# Patient Record
Sex: Female | Born: 1975
Health system: Southern US, Community
[De-identification: ages and names within clinical notes are randomized; demographics above are authoritative.]

## PROBLEM LIST (undated history)

## (undated) DIAGNOSIS — F319 Bipolar disorder, unspecified: Secondary | ICD-10-CM

## (undated) DIAGNOSIS — N649 Disorder of breast, unspecified: Secondary | ICD-10-CM

## (undated) DIAGNOSIS — Z8614 Personal history of Methicillin resistant Staphylococcus aureus infection: Secondary | ICD-10-CM

## (undated) DIAGNOSIS — J449 Chronic obstructive pulmonary disease, unspecified: Secondary | ICD-10-CM

## (undated) DIAGNOSIS — F209 Schizophrenia, unspecified: Secondary | ICD-10-CM

## (undated) DIAGNOSIS — R519 Headache, unspecified: Secondary | ICD-10-CM

## (undated) DIAGNOSIS — Z800 Family history of malignant neoplasm of digestive organs: Secondary | ICD-10-CM

## (undated) DIAGNOSIS — I639 Cerebral infarction, unspecified: Secondary | ICD-10-CM

## (undated) DIAGNOSIS — F909 Attention-deficit hyperactivity disorder, unspecified type: Secondary | ICD-10-CM

## (undated) DIAGNOSIS — Z8042 Family history of malignant neoplasm of prostate: Secondary | ICD-10-CM

## (undated) DIAGNOSIS — F329 Major depressive disorder, single episode, unspecified: Secondary | ICD-10-CM

## (undated) DIAGNOSIS — F32A Depression, unspecified: Secondary | ICD-10-CM

## (undated) DIAGNOSIS — Z803 Family history of malignant neoplasm of breast: Secondary | ICD-10-CM

## (undated) DIAGNOSIS — T50901A Poisoning by unspecified drugs, medicaments and biological substances, accidental (unintentional), initial encounter: Secondary | ICD-10-CM

## (undated) DIAGNOSIS — C50919 Malignant neoplasm of unspecified site of unspecified female breast: Secondary | ICD-10-CM

## (undated) DIAGNOSIS — G43909 Migraine, unspecified, not intractable, without status migrainosus: Secondary | ICD-10-CM

## (undated) DIAGNOSIS — R51 Headache: Secondary | ICD-10-CM

## (undated) DIAGNOSIS — K219 Gastro-esophageal reflux disease without esophagitis: Secondary | ICD-10-CM

## (undated) HISTORY — DX: Malignant neoplasm of unspecified site of unspecified female breast: C50.919

## (undated) HISTORY — DX: Disorder of breast, unspecified: N64.9

## (undated) HISTORY — DX: Family history of malignant neoplasm of digestive organs: Z80.0

## (undated) HISTORY — DX: Family history of malignant neoplasm of breast: Z80.3

## (undated) HISTORY — DX: Family history of malignant neoplasm of prostate: Z80.42

## (undated) HISTORY — DX: Chronic obstructive pulmonary disease, unspecified: J44.9

---
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
## 2004-07-18 ENCOUNTER — Emergency Department: Payer: Self-pay | Admitting: Emergency Medicine

## 2005-03-20 ENCOUNTER — Emergency Department: Payer: Self-pay | Admitting: Emergency Medicine

## 2005-04-15 ENCOUNTER — Emergency Department: Payer: Self-pay | Admitting: Emergency Medicine

## 2005-06-17 ENCOUNTER — Other Ambulatory Visit: Payer: Self-pay

## 2005-06-17 ENCOUNTER — Emergency Department: Payer: Self-pay | Admitting: Unknown Physician Specialty

## 2005-09-09 ENCOUNTER — Emergency Department: Payer: Self-pay | Admitting: Emergency Medicine

## 2006-10-04 ENCOUNTER — Emergency Department: Payer: Self-pay | Admitting: Emergency Medicine

## 2007-01-15 ENCOUNTER — Emergency Department: Payer: Self-pay | Admitting: Emergency Medicine

## 2007-06-21 ENCOUNTER — Emergency Department: Payer: Self-pay | Admitting: Emergency Medicine

## 2008-03-25 DIAGNOSIS — T50901A Poisoning by unspecified drugs, medicaments and biological substances, accidental (unintentional), initial encounter: Secondary | ICD-10-CM

## 2008-03-25 HISTORY — DX: Poisoning by unspecified drugs, medicaments and biological substances, accidental (unintentional), initial encounter: T50.901A

## 2008-04-07 ENCOUNTER — Emergency Department: Payer: Self-pay | Admitting: Emergency Medicine

## 2010-01-18 ENCOUNTER — Emergency Department: Payer: Self-pay | Admitting: Unknown Physician Specialty

## 2010-08-12 ENCOUNTER — Emergency Department: Payer: Self-pay | Admitting: Emergency Medicine

## 2010-10-31 ENCOUNTER — Emergency Department: Payer: Self-pay | Admitting: Emergency Medicine

## 2010-11-28 ENCOUNTER — Emergency Department: Payer: Self-pay | Admitting: Internal Medicine

## 2011-03-08 ENCOUNTER — Emergency Department: Payer: Self-pay | Admitting: Unknown Physician Specialty

## 2011-11-24 ENCOUNTER — Emergency Department: Payer: Self-pay | Admitting: Emergency Medicine

## 2012-11-09 ENCOUNTER — Emergency Department: Payer: Self-pay | Admitting: Emergency Medicine

## 2012-11-09 LAB — URINALYSIS, COMPLETE
Blood: NEGATIVE
Glucose,UR: NEGATIVE mg/dL (ref 0–75)
Leukocyte Esterase: NEGATIVE
Nitrite: NEGATIVE
Ph: 6 (ref 4.5–8.0)
Protein: NEGATIVE
RBC,UR: 1 /HPF (ref 0–5)
Squamous Epithelial: 2
WBC UR: <1 /HPF (ref 0–5)

## 2014-07-04 ENCOUNTER — Emergency Department
Admit: 2014-07-04 | Disposition: A | Discharge status: Home / Self Care | Payer: Self-pay | Admitting: Emergency Medicine

## 2014-07-06 ENCOUNTER — Emergency Department: Admit: 2014-07-06 | Disposition: A | Discharge status: Home / Self Care | Payer: Self-pay | Admitting: Internal Medicine

## 2014-10-05 ENCOUNTER — Emergency Department
Admission: EM | Admit: 2014-10-05 | Discharge: 2014-10-05 | Disposition: A | Discharge status: Home / Self Care | Payer: Medicaid Other | Attending: Emergency Medicine | Admitting: Emergency Medicine

## 2014-10-05 ENCOUNTER — Emergency Department: Payer: Medicaid Other

## 2014-10-05 ENCOUNTER — Encounter: Payer: Self-pay | Admitting: *Deleted

## 2014-10-05 DIAGNOSIS — S92512A Displaced fracture of proximal phalanx of left lesser toe(s), initial encounter for closed fracture: Secondary | ICD-10-CM | POA: Insufficient documentation

## 2014-10-05 DIAGNOSIS — F331 Major depressive disorder, recurrent, moderate: Secondary | ICD-10-CM | POA: Insufficient documentation

## 2014-10-05 DIAGNOSIS — Y998 Other external cause status: Secondary | ICD-10-CM | POA: Diagnosis not present

## 2014-10-05 DIAGNOSIS — Y9389 Activity, other specified: Secondary | ICD-10-CM | POA: Insufficient documentation

## 2014-10-05 DIAGNOSIS — W228XXA Striking against or struck by other objects, initial encounter: Secondary | ICD-10-CM | POA: Diagnosis not present

## 2014-10-05 DIAGNOSIS — S92912A Unspecified fracture of left toe(s), initial encounter for closed fracture: Secondary | ICD-10-CM

## 2014-10-05 DIAGNOSIS — Z720 Tobacco use: Secondary | ICD-10-CM | POA: Diagnosis not present

## 2014-10-05 DIAGNOSIS — Y9289 Other specified places as the place of occurrence of the external cause: Secondary | ICD-10-CM | POA: Insufficient documentation

## 2014-10-05 DIAGNOSIS — S99922A Unspecified injury of left foot, initial encounter: Secondary | ICD-10-CM | POA: Diagnosis present

## 2014-10-05 DIAGNOSIS — F329 Major depressive disorder, single episode, unspecified: Secondary | ICD-10-CM | POA: Insufficient documentation

## 2014-10-05 DIAGNOSIS — F319 Bipolar disorder, unspecified: Secondary | ICD-10-CM | POA: Insufficient documentation

## 2014-10-05 HISTORY — DX: Bipolar disorder, unspecified: F31.9

## 2014-10-05 HISTORY — DX: Depression, unspecified: F32.A

## 2014-10-05 HISTORY — DX: Major depressive disorder, single episode, unspecified: F32.9

## 2014-10-05 MED ORDER — HYDROCODONE-ACETAMINOPHEN 5-325 MG PO TABS: 1.0000 | ORAL_TABLET | ORAL | Status: DC | PRN

## 2014-10-05 MED ORDER — NAPROXEN 500 MG PO TABS: 500.0000 mg | ORAL_TABLET | Freq: Two times a day (BID) | ORAL | Status: DC

## 2014-10-05 NOTE — ED Notes (Signed)
Hit left foot on chair last pm, c/o pain left 3/4 toe

## 2014-10-05 NOTE — ED Provider Notes (Signed)
[facility] [place] Emergency Department Provider Note  ____________________________________________  Time seen:  11:38 AM  I have reviewed the triage vital signs and the nursing notes.   HISTORY  Chief Complaint Toe Pain   HPI Carol Crawford is a 39 y.o. female is here with complaint of toe pain. She states that last evening she hit a metal chair and has had pain in her fourth toe since. She denies any medication this morning. He rates her pain as 8/10. Walking makes her pain worse. Sitting or lying with her foot up and improves her pain.She denies any previous injury to her foot.   Past Medical History  Diagnosis Date  . Depressed   . Bipolar 1 disorder     Patient Active Problem List   Diagnosis Date Noted  . Depressed   . Bipolar 1 disorder     History reviewed. No pertinent past surgical history.  Current Outpatient Rx  Name  Route  Sig  Dispense  Refill  . HYDROcodone-acetaminophen (NORCO/VICODIN) 5-325 MG per tablet   Oral   Take 1 tablet by mouth every 4 (four) hours as needed for moderate pain.   20 tablet   0   . naproxen (NAPROSYN) 500 MG tablet   Oral   Take 1 tablet (500 mg total) by mouth 2 (two) times daily with a meal.   30 tablet   0     Allergies Review of patient's allergies indicates no known allergies.  No family history on file.  Social History History  Substance Use Topics  . Smoking status: Current Every Day Smoker  . Smokeless tobacco: Not on file  . Alcohol Use: Yes    Review of Systems Constitutional: No fever/chills Eyes: No visual changes. Cardiovascular: Denies chest pain. Respiratory: Denies shortness of breath. Gastrointestinal: No abdominal pain.  No nausea, no vomiting. Genitourinary: Negative for dysuria. Musculoskeletal: Negative for back pain. Skin: Negative for rash. Neurological: Negative for headaches, focal weakness or numbness.  10-point ROS otherwise  negative.  ____________________________________________   PHYSICAL EXAM:  VITAL SIGNS: ED Triage Vitals  Enc Vitals Group     BP 10/05/14 1029 107/76 mmHg     Pulse Rate 10/05/14 1029 78     Resp 10/05/14 1029 18     Temp 10/05/14 1029 98.4 F (36.9 C)     Temp Source 10/05/14 1029 Oral     SpO2 10/05/14 1029 99 %     Weight 10/05/14 1029 157 lb (71.215 kg)     Height 10/05/14 1029 5\' 7"  (1.702 m)     Head Cir --      Peak Flow --      Pain Score 10/05/14 1028 8     Pain Loc --      Pain Edu? --      Excl. in [place]? --     Constitutional: Alert and oriented. Well appearing and in no acute distress. Eyes: Conjunctivae are normal. PERRL. EOMI. Head: Atraumatic. Nose: No congestion/rhinnorhea.  Neck: No stridor.   Cardiovascular: Normal rate, regular rhythm. Grossly normal heart sounds.  Good peripheral circulation. Respiratory: Normal respiratory effort.  No retractions. Lungs CTAB. Gastrointestinal: Soft and nontender. No distention Musculoskeletal: No lower extremity tenderness nor edema.  No joint effusions. Left fourth toe moderate edema noted. No gross deformity. Moderate tenderness on palpation. Range of motion deferred secondary to pain. Motor sensory function intact Neurologic:  Normal speech and language. No gross focal neurologic deficits are appreciated. No gait instability. Skin:  Skin is warm, dry and intact. No rash noted. No ecchymosis or abrasions noted. Psychiatric: Mood and affect are normal. Speech and behavior are normal.  ____________________________________________   LABS (all labs ordered are listed, but only abnormal results are displayed)  Labs Reviewed - No data to display   RADIOLOGY  Left foot x-ray per radiologist and reviewed by me shows minimal displaced oblique fracture proximal phalanx fourth toe. I, Johnn Hai, personally viewed and evaluated these images as part of my medical decision making.   ____________________________________________   PROCEDURES  Procedure(s) performed: None  Critical Care performed: No  ____________________________________________   INITIAL IMPRESSION / ASSESSMENT AND PLAN / ED COURSE  Pertinent labs & imaging results that were available during my care of the patient were reviewed by me and considered in my medical decision making (see chart for details).  Patient was buddy taped and put in a wooden shoe. She was started on Norco for pain and Naprosyn for inflammation and pain. She is follow-up with Dr. Cleda Mccreedy or her PCP as needed. Ice and elevation. ____________________________________________   FINAL CLINICAL IMPRESSION(S) / ED DIAGNOSES  Final diagnoses:  Fracture, toe, left, closed, initial encounter      Johnn Hai, PA-C 10/05/14 1210  Delman Kitten, MD 10/05/14 1558

## 2014-10-05 NOTE — ED Notes (Signed)
3rd and 4th toes on left foot were taped together. Patient tolerated procedure well.

## 2014-10-05 NOTE — Discharge Instructions (Signed)
ICE AND ELEVATE NORCO FOR SEVERE PAIN, NAPROXEN FOR INFLAMMATION AND PAIN EVERY DAY FOLLOW UP WITH DR. CLINE IF ANY PROBLEMS

## 2014-10-05 NOTE — ED Notes (Signed)
Hit left foot on chair last pm, c/o pain left 3/4 th toe

## 2014-12-17 ENCOUNTER — Emergency Department
Admission: EM | Admit: 2014-12-17 | Discharge: 2014-12-17 | Disposition: A | Discharge status: Home / Self Care | Payer: Medicaid Other | Attending: Student | Admitting: Student

## 2014-12-17 DIAGNOSIS — L02416 Cutaneous abscess of left lower limb: Secondary | ICD-10-CM | POA: Diagnosis not present

## 2014-12-17 DIAGNOSIS — L03116 Cellulitis of left lower limb: Secondary | ICD-10-CM | POA: Diagnosis not present

## 2014-12-17 DIAGNOSIS — Z720 Tobacco use: Secondary | ICD-10-CM | POA: Diagnosis not present

## 2014-12-17 DIAGNOSIS — N898 Other specified noninflammatory disorders of vagina: Secondary | ICD-10-CM

## 2014-12-17 DIAGNOSIS — L0390 Cellulitis, unspecified: Secondary | ICD-10-CM

## 2014-12-17 DIAGNOSIS — Z791 Long term (current) use of non-steroidal anti-inflammatories (NSAID): Secondary | ICD-10-CM | POA: Insufficient documentation

## 2014-12-17 DIAGNOSIS — L0291 Cutaneous abscess, unspecified: Secondary | ICD-10-CM

## 2014-12-17 MED ORDER — SULFAMETHOXAZOLE-TRIMETHOPRIM 800-160 MG PO TABS: 1.0000 | ORAL_TABLET | Freq: Two times a day (BID) | ORAL | Status: DC

## 2014-12-17 NOTE — Discharge Instructions (Signed)
Abscess An abscess (boil or furuncle) is an infected area on or under the skin. This area is filled with yellowish-white fluid (pus) and other material (debris). HOME CARE   Only take medicines as told by your doctor.  If you were given antibiotic medicine, take it as directed. Finish the medicine even if you start to feel better.  If gauze is used, follow your doctor's directions for changing the gauze.  To avoid spreading the infection:  Keep your abscess covered with a bandage.  Wash your hands well.  Do not share personal care items, towels, or whirlpools with others.  Avoid skin contact with others.  Keep your skin and clothes clean around the abscess.  Keep all doctor visits as told. GET HELP RIGHT AWAY IF:   You have more pain, puffiness (swelling), or redness in the wound site.  You have more fluid or blood coming from the wound site.  You have muscle aches, chills, or you feel sick.  You have a fever. MAKE SURE YOU:   Understand these instructions.  Will watch your condition.  Will get help right away if you are not doing well or get worse. Document Released: 08/28/2007 Document Revised: 09/10/2011 Document Reviewed: 05/24/2011 [facility] Patient Information 2015 [place], [place]. This information is not intended to replace advice given to you by your health care provider. Make sure you discuss any questions you have with your health care provider.                                                                                        Vaginal discharge:  Please follow up with the health department as scheduled on Tuesday. Return to the emergency department for abdominal pain, fever, or other symptoms of concern if you are unable to see the health department sooner. You should avoid intercourse until after your appointment.

## 2014-12-17 NOTE — ED Notes (Signed)
Pt reports that she was bitten by a spider (there was one in the vicinity when she felt the pain). She reports some pain in leg, mostly when she is walking. She also reports that she has vaginal discharge (thick and brown, sometimes dark red) for last 2 weeks but only during sex. She has no discharge in her urine or panties at any other time. She made appt with health department for upcoming week but she doesn't want to wait until then to be seen. NAD noted.

## 2014-12-17 NOTE — ED Provider Notes (Signed)
[facility] Emergency Department Provider Note ____________________________________________  Time seen: Approximately 11:37 AM  I have reviewed the triage vital signs and the nursing notes.   HISTORY  Chief Complaint Insect Bite   HPI Carol Crawford is a 39 y.o. female who presents to the emergency department for evaluation of an insect bite present on her left lower leg. She noticed yesterday evening and it is worse today. She is also having brown vaginal discharge only after intercourse. She denies pelvic pain, dyspareunia, dysuria, or odor. She started Depo injections in August. She has an appointment scheduled with the health department for an exam on Tuesday.   Past Medical History  Diagnosis Date  . Depressed   . Bipolar 1 disorder     Patient Active Problem List   Diagnosis Date Noted  . Depressed   . Bipolar 1 disorder     No past surgical history on file.  Current Outpatient Rx  Name  Route  Sig  Dispense  Refill  . HYDROcodone-acetaminophen (NORCO/VICODIN) 5-325 MG per tablet   Oral   Take 1 tablet by mouth every 4 (four) hours as needed for moderate pain.   20 tablet   0   . naproxen (NAPROSYN) 500 MG tablet   Oral   Take 1 tablet (500 mg total) by mouth 2 (two) times daily with a meal.   30 tablet   0   . sulfamethoxazole-trimethoprim (BACTRIM DS,SEPTRA DS) 800-160 MG per tablet   Oral   Take 1 tablet by mouth 2 (two) times daily.   20 tablet   0     Allergies Review of patient's allergies indicates no known allergies.  No family history on file.  Social History Social History  Substance Use Topics  . Smoking status: Current Every Day Smoker  . Smokeless tobacco: Not on file  . Alcohol Use: Yes    Review of Systems Constitutional: No fever/chills Eyes: No visual changes. ENT: No sore throat. Cardiovascular: Denies chest pain. Respiratory: Denies shortness of breath. Gastrointestinal: No abdominal pain.  No  nausea, no vomiting.  No diarrhea.  No constipation. Genitourinary: Negative for dysuria. Musculoskeletal: Negative for back pain. Skin: Negative for rash. Positive for lesion to LLE Neurological: Negative for headaches, focal weakness or numbness.  10-point ROS otherwise negative.  ____________________________________________   PHYSICAL EXAM:  VITAL SIGNS: ED Triage Vitals  Enc Vitals Group     BP 12/17/14 1115 125/83 mmHg     Pulse Rate 12/17/14 1115 62     Resp 12/17/14 1115 16     Temp 12/17/14 1115 98.7 F (37.1 C)     Temp Source 12/17/14 1115 Oral     SpO2 12/17/14 1115 96 %     Weight 12/17/14 1115 154 lb (69.854 kg)     Height 12/17/14 1115 5\' 7"  (1.702 m)     Head Cir --      Peak Flow --      Pain Score 12/17/14 1116 6     Pain Loc --      Pain Edu? --      Excl. in [place]? --     Constitutional: Alert and oriented. Well appearing and in no acute distress. Eyes: Conjunctivae are normal. PERRL. EOMI. Head: Atraumatic. Nose: No congestion/rhinnorhea. Mouth/Throat: Mucous membranes are moist.  Oropharynx non-erythematous. Neck: No stridor.   Cardiovascular: Normal rate, regular rhythm. Grossly normal heart sounds.  Good peripheral circulation. Respiratory: Normal respiratory effort.  No retractions. Lungs CTAB. Gastrointestinal:  Soft and nontender. No distention. No abdominal bruits. No CVA tenderness. Musculoskeletal: No lower extremity tenderness nor edema.  No joint effusions. Neurologic:  Normal speech and language. No gross focal neurologic deficits are appreciated. No gait instability. Skin:  Skin is warm, dry and intact. No rash noted. 1cm fluctuant lesion with mild surrounding erythema noted to the left medial lower extremity. Psychiatric: Mood and affect are normal. Speech and behavior are normal.  ____________________________________________   LABS (all labs ordered are listed, but only abnormal results are displayed)  Labs Reviewed - No data to  display ____________________________________________  EKG  ____________________________________________  RADIOLOGY  Not indicated. ____________________________________________   PROCEDURES  Procedure(s) performed: None  Critical Care performed: No  ____________________________________________   INITIAL IMPRESSION / ASSESSMENT AND PLAN / ED COURSE  Pertinent labs & imaging results that were available during my care of the patient were reviewed by me and considered in my medical decision making (see chart for details).  I&D not indicated. She will be placed on Bactrim. She will go to the health Department on Tuesday as scheduled for her vaginal exam and STD testing. She was advised to return to the emergency department for abdominal pain, fever, nausea, or vomiting if she is unable to get into the health Department earlier. ____________________________________________   FINAL CLINICAL IMPRESSION(S) / ED DIAGNOSES  Final diagnoses:  Vaginal discharge  Cellulitis and abscess      Victorino Dike, FNP 12/17/14 Shiloh Gayle, MD 12/17/14 1530

## 2014-12-17 NOTE — ED Notes (Signed)
Pt discharged home after verbalizing understanding of discharge instructions; nad noted. 

## 2014-12-17 NOTE — ED Notes (Signed)
Patient presents to the ED with a small round swollen area to her left leg.  Patient states she saw a black spider near her last night and believes the spider bit her.  Patient is in no obvious distress at this time.  Patient states, "I also want to be seen for for something else too, ever since I've been on depo I've had a brownish discharge during sex."  Patient denies any other vaginal bleeding, pain, or discharge, states discharge only occurs during intercourse.

## 2015-04-12 ENCOUNTER — Emergency Department
Admission: EM | Admit: 2015-04-12 | Discharge: 2015-04-12 | Disposition: A | Discharge status: Home / Self Care | Payer: Medicaid Other | Attending: Emergency Medicine | Admitting: Emergency Medicine

## 2015-04-12 ENCOUNTER — Encounter: Payer: Self-pay | Admitting: Emergency Medicine

## 2015-04-12 DIAGNOSIS — R112 Nausea with vomiting, unspecified: Secondary | ICD-10-CM | POA: Diagnosis not present

## 2015-04-12 DIAGNOSIS — N390 Urinary tract infection, site not specified: Secondary | ICD-10-CM | POA: Diagnosis not present

## 2015-04-12 DIAGNOSIS — Z79899 Other long term (current) drug therapy: Secondary | ICD-10-CM | POA: Diagnosis not present

## 2015-04-12 DIAGNOSIS — F17200 Nicotine dependence, unspecified, uncomplicated: Secondary | ICD-10-CM | POA: Insufficient documentation

## 2015-04-12 DIAGNOSIS — Z791 Long term (current) use of non-steroidal anti-inflammatories (NSAID): Secondary | ICD-10-CM | POA: Diagnosis not present

## 2015-04-12 DIAGNOSIS — Z3202 Encounter for pregnancy test, result negative: Secondary | ICD-10-CM | POA: Diagnosis not present

## 2015-04-12 DIAGNOSIS — R319 Hematuria, unspecified: Secondary | ICD-10-CM | POA: Diagnosis present

## 2015-04-12 LAB — CBC WITH DIFFERENTIAL/PLATELET
Basophils Absolute: 0 10*3/uL (ref 0–0.1)
Basophils Relative: 0 %
Eosinophils Absolute: 0.1 10*3/uL (ref 0–0.7)
Eosinophils Relative: 1 %
HCT: 40.3 % (ref 35.0–47.0)
Hemoglobin: 13.4 g/dL (ref 12.0–16.0)
Lymphocytes Relative: 23 %
Lymphs Abs: 1.7 10*3/uL (ref 1.0–3.6)
MCH: 32.1 pg (ref 26.0–34.0)
MCHC: 33.3 g/dL (ref 32.0–36.0)
MCV: 96.5 fL (ref 80.0–100.0)
Monocytes Absolute: 0.4 10*3/uL (ref 0.2–0.9)
Monocytes Relative: 6 %
Neutro Abs: 5 10*3/uL (ref 1.4–6.5)
Neutrophils Relative %: 70 %
Platelets: 215 10*3/uL (ref 150–440)
RBC: 4.18 MIL/uL (ref 3.80–5.20)
RDW: 12.9 % (ref 11.5–14.5)
WBC: 7.2 10*3/uL (ref 3.6–11.0)

## 2015-04-12 LAB — COMPREHENSIVE METABOLIC PANEL WITH GFR
ALT: 14 U/L (ref 14–54)
AST: 17 U/L (ref 15–41)
Albumin: 4 g/dL (ref 3.5–5.0)
Alkaline Phosphatase: 66 U/L (ref 38–126)
Anion gap: 8 (ref 5–15)
BUN: 12 mg/dL (ref 6–20)
CO2: 24 mmol/L (ref 22–32)
Calcium: 9 mg/dL (ref 8.9–10.3)
Chloride: 108 mmol/L (ref 101–111)
Creatinine, Ser: 0.81 mg/dL (ref 0.44–1.00)
GFR calc Af Amer: >60 mL/min
GFR calc non Af Amer: >60 mL/min
Glucose, Bld: 94 mg/dL (ref 65–99)
Potassium: 3.6 mmol/L (ref 3.5–5.1)
Sodium: 140 mmol/L (ref 135–145)
Total Bilirubin: 0.7 mg/dL (ref 0.3–1.2)
Total Protein: 6.7 g/dL (ref 6.5–8.1)

## 2015-04-12 LAB — LIPASE, BLOOD: Lipase: 19 U/L (ref 11–51)

## 2015-04-12 LAB — URINALYSIS COMPLETE WITH MICROSCOPIC (ARMC ONLY)
BILIRUBIN URINE: NEGATIVE
GLUCOSE, UA: NEGATIVE mg/dL
Ketones, ur: NEGATIVE mg/dL
NITRITE: NEGATIVE
PROTEIN: 30 mg/dL — AB
Specific Gravity, Urine: 1.021 (ref 1.005–1.030)
pH: 6 (ref 5.0–8.0)

## 2015-04-12 LAB — PREGNANCY, URINE: PREG TEST UR: NEGATIVE

## 2015-04-12 MED ORDER — DEXTROSE 5 % IV SOLN
1.0000 g | Freq: Once | INTRAVENOUS | Status: AC
  Administered 2015-04-12: 1 g via INTRAVENOUS
  Filled 2015-04-12: qty 10

## 2015-04-12 MED ORDER — ONDANSETRON HCL 4 MG/2ML IJ SOLN
4.0000 mg | Freq: Once | INTRAMUSCULAR | Status: AC
  Administered 2015-04-12: 4 mg via INTRAVENOUS
  Filled 2015-04-12: qty 2

## 2015-04-12 MED ORDER — SODIUM CHLORIDE 0.9 % IV BOLUS (SEPSIS)
1000.0000 mL | Freq: Once | INTRAVENOUS | Status: AC
  Administered 2015-04-12: 1000 mL via INTRAVENOUS

## 2015-04-12 MED ORDER — PHENAZOPYRIDINE HCL 200 MG PO TABS: 200.0000 mg | ORAL_TABLET | Freq: Three times a day (TID) | ORAL | Status: DC | PRN

## 2015-04-12 MED ORDER — CEPHALEXIN 500 MG PO CAPS: 500.0000 mg | ORAL_CAPSULE | Freq: Three times a day (TID) | ORAL | Status: AC

## 2015-04-12 NOTE — ED Provider Notes (Signed)
CSN: [id number]     Arrival date & time 04/12/15  1016 History   First MD Initiated Contact with Patient 04/12/15 1031     Chief Complaint  Patient presents with  . Hematuria     (Consider location/radiation/quality/duration/timing/severity/associated sxs/prior Treatment) The history is provided by the patient.  Carol Crawford is a 40 y.o. female hx of depression, bipolar, UTI here with some urinary hesitancy, dysuria. Last several days she noticed that she has pain when she urinates and has only been urinating very little. She feel nauseated today while at work and had several episodes of vomiting. Of note, patient had some vaginal spotting yesterday that completely resolved. Her last step or shot was 6 months ago and she did miss her 3 month depo shot and is still sexually active.     Past Medical History  Diagnosis Date  . Depressed   . Bipolar 1 disorder ([place])    History reviewed. No pertinent past surgical history. History reviewed. No pertinent family history. Social History  Substance Use Topics  . Smoking status: Current Every Day Smoker  . Smokeless tobacco: None  . Alcohol Use: Yes   OB History    No data available     Review of Systems  Gastrointestinal: Positive for nausea and vomiting.  Genitourinary: Positive for hematuria.  All other systems reviewed and are negative.     Allergies  Review of patient's allergies indicates no known allergies.  Home Medications   Prior to Admission medications   Medication Sig Start Date End Date Taking? Authorizing Provider  HYDROcodone-acetaminophen (NORCO/VICODIN) 5-325 MG per tablet Take 1 tablet by mouth every 4 (four) hours as needed for moderate pain. Patient not taking: Reported on 04/12/2015 10/05/14   Johnn Hai, PA-C  naproxen (NAPROSYN) 500 MG tablet Take 1 tablet (500 mg total) by mouth 2 (two) times daily with a meal. Patient not taking: Reported on 04/12/2015 10/05/14   Johnn Hai, PA-C   sulfamethoxazole-trimethoprim (BACTRIM DS,SEPTRA DS) 800-160 MG per tablet Take 1 tablet by mouth 2 (two) times daily. Patient not taking: Reported on 04/12/2015 12/17/14   Cari B Triplett, FNP   BP 115/77 mmHg  Pulse 63  Temp(Src) 98.3 F (36.8 C) (Oral)  Resp 18  Ht 5\' 7"  (1.702 m)  Wt 157 lb (71.215 kg)  BMI 24.58 kg/m2  SpO2 100%  LMP 03/15/2015 Physical Exam  Constitutional: She is oriented to person, place, and time.  Nauseated   HENT:  Head: Normocephalic.  MM dry   Eyes: Conjunctivae are normal. Pupils are equal, round, and reactive to light.  Neck: Normal range of motion. Neck supple.  Cardiovascular: Normal rate, regular rhythm and normal heart sounds.   Pulmonary/Chest: Effort normal and breath sounds normal. No respiratory distress. She has no wheezes. She has no rales.  Abdominal: Soft. Bowel sounds are normal.  Mild suprapubic tenderness, no CVAT   Musculoskeletal: Normal range of motion. She exhibits no edema or tenderness.  Neurological: She is alert and oriented to person, place, and time.  Skin: Skin is warm and dry.  Psychiatric: She has a normal mood and affect. Her behavior is normal. Judgment and thought content normal.  Nursing note and vitals reviewed.   ED Course  Procedures (including critical care time) Labs Review Labs Reviewed  URINALYSIS COMPLETEWITH MICROSCOPIC ([facility] ONLY) - Abnormal; Notable for the following:    Color, Urine YELLOW (*)    APPearance CLOUDY (*)    Hgb urine dipstick 1+ (*)  Protein, ur 30 (*)    Leukocytes, UA 2+ (*)    Bacteria, UA RARE (*)    Squamous Epithelial / LPF 6-30 (*)    All other components within normal limits  URINE CULTURE  PREGNANCY, URINE  CBC WITH DIFFERENTIAL/PLATELET  COMPREHENSIVE METABOLIC PANEL  LIPASE, BLOOD    Imaging Review No results found. I have personally reviewed and evaluated these images and lab results as part of my medical decision-making.   EKG Interpretation None       MDM   Final diagnoses:  None   Carol Crawford is a 40 y.o. female here with dysuria, ab pain. Likely UTI vs gastro. Will also check pregnancy test as she missed her depo shot several months ago.   2:32 PM UCG neg. UA + UTI. WBC nl. Vitals stable. Symptoms likely from UTI. Given ceftriaxone. Will dc home with keflex.     Wandra Arthurs, MD 04/12/15 [phone]

## 2015-04-12 NOTE — ED Notes (Signed)
Reports urinary hesitancy for several days and yesterday noted a little blood.  Today feeling nauseated.

## 2015-04-12 NOTE — Discharge Instructions (Signed)
Take keflex three times daily for 5 days.   Take pyridium for dysuria for next 3 days.   See your doctor.   Return to ER if you have worse trouble urinating, severe pain, fevers, unable to urinate.

## 2015-04-14 LAB — URINE CULTURE

## 2015-05-08 ENCOUNTER — Encounter: Payer: Self-pay | Admitting: Emergency Medicine

## 2015-05-08 ENCOUNTER — Emergency Department
Admission: EM | Admit: 2015-05-08 | Discharge: 2015-05-08 | Disposition: A | Discharge status: Home / Self Care | Payer: Medicaid Other | Attending: Emergency Medicine | Admitting: Emergency Medicine

## 2015-05-08 DIAGNOSIS — F17200 Nicotine dependence, unspecified, uncomplicated: Secondary | ICD-10-CM | POA: Diagnosis not present

## 2015-05-08 DIAGNOSIS — L03211 Cellulitis of face: Secondary | ICD-10-CM | POA: Diagnosis not present

## 2015-05-08 DIAGNOSIS — R220 Localized swelling, mass and lump, head: Secondary | ICD-10-CM | POA: Diagnosis present

## 2015-05-08 MED ORDER — SULFAMETHOXAZOLE-TRIMETHOPRIM 800-160 MG PO TABS: 1.0000 | ORAL_TABLET | Freq: Two times a day (BID) | ORAL | Status: DC

## 2015-05-08 MED ORDER — OXYCODONE-ACETAMINOPHEN 5-325 MG PO TABS: 1.0000 | ORAL_TABLET | Freq: Four times a day (QID) | ORAL | Status: DC | PRN

## 2015-05-08 MED ORDER — GENTAMICIN SULFATE 0.3 % OP SOLN: 1.0000 [drp] | Freq: Three times a day (TID) | OPHTHALMIC | Status: AC

## 2015-05-08 MED ORDER — IBUPROFEN 600 MG PO TABS: 600.0000 mg | ORAL_TABLET | Freq: Four times a day (QID) | ORAL | Status: DC | PRN

## 2015-05-08 NOTE — ED Notes (Signed)
Reports having a small bump on nose and popped it, then had swelling to right face.  No resp distress.

## 2015-05-08 NOTE — Discharge Instructions (Signed)
Cellulitis °Cellulitis is an infection of the skin and the tissue under the skin. The infected area is usually red and tender. This happens most often in the arms and lower legs. °HOME CARE  °· Take your antibiotic medicine as told. Finish the medicine even if you start to feel better. °· Keep the infected arm or leg raised (elevated). °· Put a warm cloth on the area up to 4 times per day. °· Only take medicines as told by your doctor. °· Keep all doctor visits as told. °GET HELP IF: °· You see red streaks on the skin coming from the infected area. °· Your red area gets bigger or turns a dark color. °· Your bone or joint under the infected area is painful after the skin heals. °· Your infection comes back in the same area or different area. °· You have a puffy (swollen) bump in the infected area. °· You have new symptoms. °· You have a fever. °GET HELP RIGHT AWAY IF:  °· You feel very sleepy. °· You throw up (vomit) or have watery poop (diarrhea). °· You feel sick and have muscle aches and pains. °  °This information is not intended to replace advice given to you by your health care provider. Make sure you discuss any questions you have with your health care provider. °  °Document Released: 08/28/2007 Document Revised: 11/30/2014 Document Reviewed: 05/27/2011 °[employer]vier Interactive Patient Education ©2016 [employer] ° °

## 2015-05-08 NOTE — ED Provider Notes (Signed)
[facility] Emergency Department Provider Note  ____________________________________________  Time seen: Approximately 9:15 AM  I have reviewed the triage vital signs and the nursing notes.   HISTORY  Chief Complaint Facial Swelling    HPI LEE NWANKWO is a 40 y.o. female patient complain of facial swelling secondary to rupturing a bump on the inside of the left nostril. Incident occurred yesterday. Patient awakened this morning for increased swelling. Mostly on the right side of the maxillary area. Patient rates the pain as a 5/10. No palliative measures taken for this complaint. Patient also states she had right eyelid edema and matting.  Past Medical History  Diagnosis Date  . Depressed   . Bipolar 1 disorder [facility])     Patient Active Problem List   Diagnosis Date Noted  . Depressed   . Bipolar 1 disorder ([place])     History reviewed. No pertinent past surgical history.  Current Outpatient Rx  Name  Route  Sig  Dispense  Refill  . gentamicin (GARAMYCIN) 0.3 % ophthalmic solution   Both Eyes   Place 1 drop into both eyes 3 (three) times daily.   5 mL   0   . HYDROcodone-acetaminophen (NORCO/VICODIN) 5-325 MG per tablet   Oral   Take 1 tablet by mouth every 4 (four) hours as needed for moderate pain. Patient not taking: Reported on 04/12/2015   20 tablet   0   . ibuprofen (ADVIL,MOTRIN) 600 MG tablet   Oral   Take 1 tablet (600 mg total) by mouth every 6 (six) hours as needed.   30 tablet   0   . naproxen (NAPROSYN) 500 MG tablet   Oral   Take 1 tablet (500 mg total) by mouth 2 (two) times daily with a meal. Patient not taking: Reported on 04/12/2015   30 tablet   0   . oxyCODONE-acetaminophen (ROXICET) 5-325 MG tablet   Oral   Take 1 tablet by mouth every 6 (six) hours as needed for moderate pain.   12 tablet   0   . phenazopyridine (PYRIDIUM) 200 MG tablet   Oral   Take 1 tablet (200 mg total) by mouth 3 (three) times daily as  needed for pain.   6 tablet   0   . sulfamethoxazole-trimethoprim (BACTRIM DS,SEPTRA DS) 800-160 MG per tablet   Oral   Take 1 tablet by mouth 2 (two) times daily. Patient not taking: Reported on 04/12/2015   20 tablet   0   . sulfamethoxazole-trimethoprim (BACTRIM DS,SEPTRA DS) 800-160 MG tablet   Oral   Take 1 tablet by mouth 2 (two) times daily.   20 tablet   0     Allergies Review of patient's allergies indicates no known allergies.  History reviewed. No pertinent family history.  Social History Social History  Substance Use Topics  . Smoking status: Current Every Day Smoker  . Smokeless tobacco: None  . Alcohol Use: Yes    Review of Systems Constitutional: No fever/chills Eyes: No visual changes. ENT: No sore throat. Cardiovascular: Denies chest pain. Respiratory: Denies shortness of breath. Gastrointestinal: No abdominal pain.  No nausea, no vomiting.  No diarrhea.  No constipation. Genitourinary: Negative for dysuria. Musculoskeletal: Negative for back pain. Skin: Negative for rash. Right maxillary edema and erythema Neurological: Negative for headaches, focal weakness or numbness.    ____________________________________________   PHYSICAL EXAM:  VITAL SIGNS: ED Triage Vitals  Enc Vitals Group     BP 05/08/15 0908 130/86  mmHg     Pulse Rate 05/08/15 0908 62     Resp 05/08/15 0908 16     Temp 05/08/15 0908 98.5 F (36.9 C)     Temp Source 05/08/15 0908 Oral     SpO2 05/08/15 0908 100 %     Weight 05/08/15 0908 157 lb (71.215 kg)     Height 05/08/15 0908 5\' 7"  (1.702 m)     Head Cir --      Peak Flow --      Pain Score 05/08/15 0907 5     Pain Loc --      Pain Edu? --      Excl. in [place]? --     Constitutional: Alert and oriented. Well appearing and in no acute distress. Eyes: Right conjunctiva erythematous. PERRL. EOMI. traumatic material upper and lower eyelid Head: Atraumatic. Nose: No congestion/rhinnorhea. Mouth/Throat: Mucous membranes  are moist.  Oropharynx non-erythematous. Neck: No stridor.  No cervical spine tenderness to palpation. Hematological/Lymphatic/Immunilogical: No cervical lymphadenopathy. Cardiovascular: Normal rate, regular rhythm. Grossly normal heart sounds.  Good peripheral circulation. Respiratory: Normal respiratory effort.  No retractions. Lungs CTAB. Gastrointestinal: Soft and nontender. No distention. No abdominal bruits. No CVA tenderness. Musculoskeletal: No lower extremity tenderness nor edema.  No joint effusions. Neurologic:  Normal speech and language. No gross focal neurologic deficits are appreciated. No gait instability. Skin:  Skin is warm, dry and intact. Erythematous papular lesion anterior right nostril. Right facial edema with mild erythema. Psychiatric: Mood and affect are normal. Speech and behavior are normal.  ____________________________________________   LABS (all labs ordered are listed, but only abnormal results are displayed)  Labs Reviewed - No data to display ____________________________________________  EKG   ____________________________________________  RADIOLOGY   ____________________________________________   PROCEDURES  Procedure(s) performed: None  Critical Care performed: No  ____________________________________________   INITIAL IMPRESSION / ASSESSMENT AND PLAN / ED COURSE  Pertinent labs & imaging results that were available during my care of the patient were reviewed by me and considered in my medical decision making (see chart for details).  Facial cellulitis. Right conjunctivitis Patient given discharge Instructions. Patient given prescription for Bactrim DS, Percocet, ibuprofen, and Garamycin eyedrops.  follow-up with family doctor if no improvement. ____________________________________________   FINAL CLINICAL IMPRESSION(S) / ED DIAGNOSES  Final diagnoses:  Facial cellulitis      Sable Feil, PA-C 05/08/15  [id number]  Eula Listen, MD 05/08/15 [phone]

## 2015-05-08 NOTE — ED Notes (Signed)
Swelling noted to right side of face for the past 1-2 days  Worse this am   Denies any tooth pain but did have a small "bump" to nose couple days ago

## 2015-11-17 ENCOUNTER — Emergency Department: Payer: No Typology Code available for payment source

## 2015-11-17 ENCOUNTER — Encounter: Payer: Self-pay | Admitting: Emergency Medicine

## 2015-11-17 ENCOUNTER — Emergency Department
Admission: EM | Admit: 2015-11-17 | Discharge: 2015-11-17 | Disposition: A | Discharge status: Home / Self Care | Payer: No Typology Code available for payment source | Attending: Emergency Medicine | Admitting: Emergency Medicine

## 2015-11-17 DIAGNOSIS — F17200 Nicotine dependence, unspecified, uncomplicated: Secondary | ICD-10-CM | POA: Insufficient documentation

## 2015-11-17 DIAGNOSIS — Y9389 Activity, other specified: Secondary | ICD-10-CM | POA: Insufficient documentation

## 2015-11-17 DIAGNOSIS — S9032XA Contusion of left foot, initial encounter: Secondary | ICD-10-CM | POA: Diagnosis not present

## 2015-11-17 DIAGNOSIS — S3992XA Unspecified injury of lower back, initial encounter: Secondary | ICD-10-CM | POA: Diagnosis present

## 2015-11-17 DIAGNOSIS — Y92410 Unspecified street and highway as the place of occurrence of the external cause: Secondary | ICD-10-CM | POA: Insufficient documentation

## 2015-11-17 DIAGNOSIS — Y999 Unspecified external cause status: Secondary | ICD-10-CM | POA: Insufficient documentation

## 2015-11-17 DIAGNOSIS — S39012A Strain of muscle, fascia and tendon of lower back, initial encounter: Secondary | ICD-10-CM | POA: Diagnosis not present

## 2015-11-17 MED ORDER — HYDROCODONE-ACETAMINOPHEN 5-325 MG PO TABS
ORAL_TABLET | ORAL | Status: AC
  Administered 2015-11-17: 1 via ORAL
  Filled 2015-11-17: qty 1

## 2015-11-17 MED ORDER — HYDROCODONE-ACETAMINOPHEN 5-325 MG PO TABS
1.0000 | ORAL_TABLET | Freq: Once | ORAL | Status: AC
  Administered 2015-11-17: 1 via ORAL

## 2015-11-17 MED ORDER — METHOCARBAMOL 500 MG PO TABS: 500.0000 mg | ORAL_TABLET | Freq: Four times a day (QID) | ORAL | 0 refills | Status: DC

## 2015-11-17 MED ORDER — MELOXICAM 15 MG PO TABS: 15.0000 mg | ORAL_TABLET | Freq: Every day | ORAL | 0 refills | Status: DC
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
## 2016-04-08 ENCOUNTER — Encounter: Payer: Self-pay | Admitting: Emergency Medicine

## 2016-04-08 ENCOUNTER — Emergency Department: Payer: Medicaid Other

## 2016-04-08 ENCOUNTER — Emergency Department
Admission: EM | Admit: 2016-04-08 | Discharge: 2016-04-08 | Disposition: A | Discharge status: Home / Self Care | Payer: Medicaid Other | Attending: Emergency Medicine | Admitting: Emergency Medicine

## 2016-04-08 DIAGNOSIS — J069 Acute upper respiratory infection, unspecified: Secondary | ICD-10-CM

## 2016-04-08 DIAGNOSIS — F17200 Nicotine dependence, unspecified, uncomplicated: Secondary | ICD-10-CM | POA: Insufficient documentation

## 2016-04-08 DIAGNOSIS — Z79899 Other long term (current) drug therapy: Secondary | ICD-10-CM | POA: Insufficient documentation

## 2016-04-08 DIAGNOSIS — J40 Bronchitis, not specified as acute or chronic: Secondary | ICD-10-CM | POA: Insufficient documentation

## 2016-04-08 DIAGNOSIS — R112 Nausea with vomiting, unspecified: Secondary | ICD-10-CM | POA: Insufficient documentation

## 2016-04-08 LAB — INFLUENZA PANEL BY PCR (TYPE A & B)
Influenza A By PCR: NEGATIVE
Influenza B By PCR: NEGATIVE

## 2016-04-08 MED ORDER — BENZONATATE 100 MG PO CAPS: 100.0000 mg | ORAL_CAPSULE | Freq: Once | ORAL | Status: DC

## 2016-04-08 MED ORDER — BENZONATATE 100 MG PO CAPS: 100.0000 mg | ORAL_CAPSULE | Freq: Four times a day (QID) | ORAL | 0 refills | Status: DC | PRN

## 2016-04-08 MED ORDER — ACETAMINOPHEN 500 MG PO TABS
ORAL_TABLET | ORAL | Status: AC
  Filled 2016-04-08: qty 2

## 2016-04-08 MED ORDER — TRAMADOL HCL 50 MG PO TABS: 50.0000 mg | ORAL_TABLET | Freq: Once | ORAL | Status: DC

## 2016-04-08 MED ORDER — ACETAMINOPHEN 500 MG PO TABS
1000.0000 mg | ORAL_TABLET | Freq: Once | ORAL | Status: AC
  Administered 2016-04-08: 1000 mg via ORAL

## 2016-04-08 MED ORDER — IBUPROFEN 600 MG PO TABS: 600.0000 mg | ORAL_TABLET | Freq: Once | ORAL | Status: DC

## 2016-04-08 MED ORDER — ONDANSETRON 4 MG PO TBDP
4.0000 mg | ORAL_TABLET | Freq: Once | ORAL | Status: DC
Start: 2016-04-08 — End: 2016-04-08

## 2016-04-08 MED ORDER — TRAMADOL HCL 50 MG PO TABS: 50.0000 mg | ORAL_TABLET | Freq: Four times a day (QID) | ORAL | 0 refills | Status: DC | PRN

## 2016-04-08 NOTE — ED Provider Notes (Signed)
[facility] Emergency Department Provider Note   ____________________________________________   First MD Initiated Contact with Patient 04/08/16 [phone]     (approximate)  I have reviewed the triage vital signs and the nursing notes.   HISTORY  Chief Complaint Cough; Fever; and Influenza    HPI Carol Crawford is a 41 y.o. female who comes into the hospital today with cold symptoms since Friday. 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 She reports her chest felt tight and she had pain in her abdomen when she coughs. She denies any fever and she reports that her cough was productive of yellow sputum. The patient denies any posttussive emesis but reports that she vomited 3 times at work. She has not vomited since then. She reports it looked just like what she had eaten and was not black or having any blood in it. The patient denies any belly pain without cough only when she coughs. 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 She's had a headache with some body aches and has felt weak. The patient did get some Tylenol in triage but did not take anything prior to coming in. She reports though that the Tylenol did help. She is here today for evaluation.   Past Medical History:  Diagnosis Date  . Bipolar 1 disorder ([place])   . Depressed     Patient Active Problem List   Diagnosis Date Noted  . Depressed   . Bipolar 1 disorder ([place])     No past surgical history on file.  Prior to Admission medications   Medication Sig Start Date End Date Taking? Authorizing Provider  benzonatate (TESSALON PERLES) 100 MG capsule Take 1 capsule (100 mg total) by mouth every 6 (six) hours as needed for cough. 04/08/16   Loney Hering, MD  meloxicam (MOBIC) 15 MG tablet Take 1 tablet (15 mg total) by mouth daily. 11/17/15   Charline Bills Cuthriell, PA-C  methocarbamol (ROBAXIN) 500 MG tablet Take 1 tablet (500 mg  total) by mouth 4 (four) times daily. 11/17/15   Charline Bills Cuthriell, PA-C  traMADol (ULTRAM) 50 MG tablet Take 1 tablet (50 mg total) by mouth every 6 (six) hours as needed. 04/08/16   Loney Hering, MD    Allergies Patient has no known allergies.  No family history on file.  Social History Social History  Substance Use Topics  . Smoking status: Current Every Day Smoker  . Smokeless tobacco: Never Used  . Alcohol use Yes    Review of Systems Constitutional: No fever/chills Eyes: No visual changes. ENT: No sore throat. Cardiovascular: Chest tightness Respiratory: Cough Gastrointestinal: Nausea and vomiting with some mild abdominal pain with coughing  No diarrhea.  No constipation. Genitourinary: Negative for dysuria. Musculoskeletal: Negative for back pain. Skin: Negative for rash. Neurological: Headache and generalized weakness  10-point ROS otherwise negative.  ____________________________________________   PHYSICAL EXAM:  VITAL SIGNS: ED Triage Vitals  Enc Vitals Group     BP 04/08/16 0135 119/82     Pulse Rate 04/08/16 0135 80     Resp 04/08/16 0135 18     Temp 04/08/16 0135 98.1 F (36.7 C)     Temp Source 04/08/16 0135 Oral     SpO2 04/08/16 0135 100 %     Weight 04/08/16 0136 173 lb (78.5 kg)     Height 04/08/16 0136 5\' 7"  (1.702 m)  Head Circumference --      Peak Flow --      Pain Score 04/08/16 0136 8     Pain Loc --      Pain Edu? --      Excl. in [place]? --     Constitutional: Alert and oriented. Well appearing and in Mild distress. Eyes: Conjunctivae are normal. PERRL. EOMI. Head: Atraumatic. Nose: No congestion/rhinnorhea. Mouth/Throat: Mucous membranes are moist.  Oropharynx non-erythematous. Cardiovascular: Normal rate, regular rhythm. Grossly normal heart sounds.  Good peripheral circulation. Respiratory: Normal respiratory effort.  No retractions. Lungs CTAB. Gastrointestinal: Soft and nontender. No distention. Positive bowel  sounds Musculoskeletal: No lower extremity tenderness nor edema.   Neurologic:  Normal speech and language.  Skin:  Skin is warm, dry and intact.  Psychiatric: Mood and affect are normal.   ____________________________________________   LABS (all labs ordered are listed, but only abnormal results are displayed)  Labs Reviewed  INFLUENZA PANEL BY PCR (TYPE A & B, H1N1)   ____________________________________________  EKG  none ____________________________________________  RADIOLOGY  CXR ____________________________________________   PROCEDURES  Procedure(s) performed: None  Procedures  Critical Care performed: No  ____________________________________________   INITIAL IMPRESSION / ASSESSMENT AND PLAN / ED COURSE  Pertinent labs & imaging results that were available during my care of the patient were reviewed by me and considered in my medical decision making (see chart for details).  This is a 41 year old female who comes into the hospital today with cold symptoms and some vomiting at work tonight. The patient did receive a flu test and she also received a chest x-ray which was negative. I informed the patient of these results and she was upset. I informed her that she probably has bronchitis with the coughing and the chest tightness but the patient did not believe that she could have bronchitis that she's never had it before. I did order some medications to include some benzonatate tramadol and ibuprofen for the patient but the patient refused the medication. She'll be discharged home to follow-up with her primary care physician.  Clinical Course as of Apr 08 633  Mon Apr 08, 2016  0422 No acute pulmonary process. DG Chest 2 View [AW]    Clinical Course User Index [AW] Loney Hering, MD     ____________________________________________   FINAL CLINICAL IMPRESSION(S) / ED DIAGNOSES  Final diagnoses:  Bronchitis  Viral upper respiratory tract infection   Non-intractable vomiting with nausea, unspecified vomiting type      NEW MEDICATIONS STARTED DURING THIS VISIT:  New Prescriptions   BENZONATATE (TESSALON PERLES) 100 MG CAPSULE    Take 1 capsule (100 mg total) by mouth every 6 (six) hours as needed for cough.   TRAMADOL (ULTRAM) 50 MG TABLET    Take 1 tablet (50 mg total) by mouth every 6 (six) hours as needed.     Note:  This document was prepared using Dragon voice recognition software and may include unintentional dictation errors.    Loney Hering, MD 04/08/16 [phone]

## 2016-04-08 NOTE — ED Notes (Signed)
Pt refused medication and vitals.  Pt states "I ain't never had bronchitis before, I'm going to [facility] to get a real diagnosis."  Pt on cell phone, unsure if pt listening to discharge instructions.  Pt unwilling to wait for discharge until phone call complete.

## 2016-04-08 NOTE — Discharge Instructions (Signed)
Please follow up with your primary care physician.

## 2016-04-08 NOTE — ED Triage Notes (Signed)
Pt ambulatory to trigae with no difficulty. Pt reports cough, congestion, body aches, fever /chills and a headache and vomiting. Pt in no acute distress at this time. Pt works at a [employer].

## 2016-06-27 ENCOUNTER — Emergency Department: Payer: Self-pay

## 2016-06-27 ENCOUNTER — Emergency Department
Admission: EM | Admit: 2016-06-27 | Discharge: 2016-06-27 | Disposition: A | Discharge status: Home / Self Care | Payer: Self-pay | Attending: Emergency Medicine | Admitting: Emergency Medicine

## 2016-06-27 DIAGNOSIS — Y939 Activity, unspecified: Secondary | ICD-10-CM | POA: Insufficient documentation

## 2016-06-27 DIAGNOSIS — S39012A Strain of muscle, fascia and tendon of lower back, initial encounter: Secondary | ICD-10-CM

## 2016-06-27 DIAGNOSIS — X58XXXA Exposure to other specified factors, initial encounter: Secondary | ICD-10-CM | POA: Insufficient documentation

## 2016-06-27 DIAGNOSIS — Y990 Civilian activity done for income or pay: Secondary | ICD-10-CM | POA: Insufficient documentation

## 2016-06-27 DIAGNOSIS — F17200 Nicotine dependence, unspecified, uncomplicated: Secondary | ICD-10-CM | POA: Insufficient documentation

## 2016-06-27 DIAGNOSIS — Y929 Unspecified place or not applicable: Secondary | ICD-10-CM | POA: Insufficient documentation

## 2016-06-27 DIAGNOSIS — M47896 Other spondylosis, lumbar region: Secondary | ICD-10-CM

## 2016-06-27 MED ORDER — IBUPROFEN 600 MG PO TABS: 600.0000 mg | ORAL_TABLET | Freq: Three times a day (TID) | ORAL | 0 refills | Status: DC | PRN

## 2016-06-27 MED ORDER — OXYCODONE-ACETAMINOPHEN 7.5-325 MG PO TABS: 1.0000 | ORAL_TABLET | Freq: Four times a day (QID) | ORAL | 0 refills | Status: DC | PRN

## 2016-06-27 MED ORDER — HYDROMORPHONE HCL 1 MG/ML IJ SOLN
1.0000 mg | Freq: Once | INTRAMUSCULAR | Status: AC
  Administered 2016-06-27: 1 mg via INTRAMUSCULAR
  Filled 2016-06-27: qty 1

## 2016-06-27 MED ORDER — KETOROLAC TROMETHAMINE 30 MG/ML IJ SOLN
30.0000 mg | Freq: Once | INTRAMUSCULAR | Status: AC
  Administered 2016-06-27: 30 mg via INTRAVENOUS
  Filled 2016-06-27: qty 1

## 2016-06-27 MED ORDER — ORPHENADRINE CITRATE 30 MG/ML IJ SOLN
60.0000 mg | Freq: Two times a day (BID) | INTRAMUSCULAR | Status: DC
  Administered 2016-06-27: 60 mg via INTRAVENOUS
  Filled 2016-06-27: qty 2

## 2016-06-27 MED ORDER — KETOROLAC TROMETHAMINE 60 MG/2ML IM SOLN
INTRAMUSCULAR | Status: AC
  Filled 2016-06-27: qty 2

## 2016-06-27 MED ORDER — CYCLOBENZAPRINE HCL 10 MG PO TABS: 10.0000 mg | ORAL_TABLET | Freq: Three times a day (TID) | ORAL | 0 refills | Status: DC | PRN

## 2016-06-27 NOTE — ED Notes (Signed)
See triage note  Presents to ED via ems from home  States she woke up with severe lower back pain/spasms  States she was unable to stand d/t weakness in legs  States she does a lot at work not sure if she moved wrong  Hx of back spasms in past  Very tearful in room

## 2016-06-27 NOTE — ED Provider Notes (Signed)
[facility] Emergency Department Provider Note   ____________________________________________   None    (approximate)  I have reviewed the triage vital signs and the nursing notes.   HISTORY  Chief Complaint Back Pain    HPI Carol Crawford is a 41 y.o. female patient arrives via EMS complaining of acute low back pain. Patient state yesterday she was moving furniture at work without any difficulty or pain. Patient stated upon awakening this morning she called to get out of bed to 20 minutes to get her close wound. Patient she was able to drive to work when she got there the pain increase. Patient stated pain is worse with movement. Patient denies any radicular component to this back pain. Patient denies any bladder or bowel dysfunction. Patient is also complaining of left flank pain as cross the midline of her abdomen.Patient rates pain as a 10 over 10. Patient described a pain as "sharp". No palliative measures prior to arrival.   Past Medical History:  Diagnosis Date  . Bipolar 1 disorder ([place])   . Depressed     Patient Active Problem List   Diagnosis Date Noted  . Depressed   . Bipolar 1 disorder ([place])     No past surgical history on file.  Prior to Admission medications   Medication Sig Start Date End Date Taking? Authorizing Provider  benzonatate (TESSALON PERLES) 100 MG capsule Take 1 capsule (100 mg total) by mouth every 6 (six) hours as needed for cough. 04/08/16   Loney Hering, MD  cyclobenzaprine (FLEXERIL) 10 MG tablet Take 1 tablet (10 mg total) by mouth 3 (three) times daily as needed. 06/27/16   Sable Feil, PA-C  ibuprofen (ADVIL,MOTRIN) 600 MG tablet Take 1 tablet (600 mg total) by mouth every 8 (eight) hours as needed. 06/27/16   Sable Feil, PA-C  meloxicam (MOBIC) 15 MG tablet Take 1 tablet (15 mg total) by mouth daily. 11/17/15   Charline Bills Cuthriell, PA-C  methocarbamol (ROBAXIN) 500 MG tablet Take 1 tablet (500 mg total)  by mouth 4 (four) times daily. 11/17/15   Charline Bills Cuthriell, PA-C  oxyCODONE-acetaminophen (PERCOCET) 7.5-325 MG tablet Take 1 tablet by mouth every 6 (six) hours as needed for severe pain. 06/27/16   Sable Feil, PA-C  traMADol (ULTRAM) 50 MG tablet Take 1 tablet (50 mg total) by mouth every 6 (six) hours as needed. 04/08/16   Loney Hering, MD    Allergies Patient has no known allergies.  No family history on file.  Social History Social History  Substance Use Topics  . Smoking status: Current Every Day Smoker  . Smokeless tobacco: Never Used  . Alcohol use Yes    Review of Systems Constitutional: No fever/chills Eyes: No visual changes. ENT: No sore throat. Cardiovascular: Denies chest pain. Respiratory: Denies shortness of breath. Gastrointestinal: No abdominal pain.  No nausea, no vomiting.  No diarrhea.  No constipation. Genitourinary: Negative for dysuria. Musculoskeletal: Positive for back pain. Skin: Negative for rash. Neurological: Negative for headaches, focal weakness or numbness. Psychiatric:Depression and bipolar ____________________________________________   PHYSICAL EXAM:  VITAL SIGNS: ED Triage Vitals  Enc Vitals Group     BP 06/27/16 0921 (!) 132/98     Pulse Rate 06/27/16 0921 67     Resp 06/27/16 0921 (!) 22     Temp 06/27/16 0921 97.8 F (36.6 C)     Temp Source 06/27/16 0921 Oral     SpO2 06/27/16 0921 100 %  Weight 06/27/16 0914 157 lb (71.2 kg)     Height 06/27/16 0914 5\' 7"  (1.702 m)     Head Circumference --      Peak Flow --      Pain Score 06/27/16 0914 10     Pain Loc --      Pain Edu? --      Excl. in [place]? --     Constitutional: Alert and oriented. Well appearing and in no acute distress. Eyes: Conjunctivae are normal. PERRL. EOMI. Head: Atraumatic. Nose: No congestion/rhinnorhea. Mouth/Throat: Mucous membranes are moist.  Oropharynx non-erythematous. Neck: No stridor.  No cervical spine tenderness to  palpation. Hematological/Lymphatic/Immunilogical: No cervical lymphadenopathy. Cardiovascular: Normal rate, regular rhythm. Grossly normal heart sounds.  Good peripheral circulation. Respiratory: Normal respiratory effort.  No retractions. Lungs CTAB. Gastrointestinal: Soft and nontender. No distention. No abdominal bruits. No CVA tenderness. Musculoskeletal: No obvious spinal deformity. Patient decreased range of motion's all fields. Patient is moderate guarding palpation of L3-S1. Patient state leg test.  Neurologic:  Normal speech and language. No gross focal neurologic deficits are appreciated. No gait instability. Skin:  Skin is warm, dry and intact. No rash noted. Psychiatric: Mood and affect are normal. Speech and behavior are normal.  ____________________________________________   LABS (all labs ordered are listed, but only abnormal results are displayed)  Labs Reviewed  URINALYSIS, COMPLETE (UACMP) WITH MICROSCOPIC  POC URINE PREG, ED   ____________________________________________  EKG   ____________________________________________  RADIOLOGY  No acute findings on x-ray lumbar spine. Patient has  degenerative changes. ____________________________________________   PROCEDURES  Procedure(s) performed: None  Procedures  Critical Care performed: No  ____________________________________________   INITIAL IMPRESSION / ASSESSMENT AND PLAN / ED COURSE  Pertinent labs & imaging results that were available during my care of the patient were reviewed by me and considered in my medical decision making (see chart for details).  Acute low back pain.      ____________________________________________   FINAL CLINICAL IMPRESSION(S) / ED DIAGNOSES  Final diagnoses:  Strain of lumbar region, initial encounter  Other osteoarthritis of spine, lumbar region  Discussed x-ray finding with patient. Patient pain has increased Route 70%. Patient given discharge care  instructions. Patient with a work note. Patient advised follow-up family doctor for continued care.    NEW MEDICATIONS STARTED DURING THIS VISIT:  New Prescriptions   CYCLOBENZAPRINE (FLEXERIL) 10 MG TABLET    Take 1 tablet (10 mg total) by mouth 3 (three) times daily as needed.   IBUPROFEN (ADVIL,MOTRIN) 600 MG TABLET    Take 1 tablet (600 mg total) by mouth every 8 (eight) hours as needed.   OXYCODONE-ACETAMINOPHEN (PERCOCET) 7.5-325 MG TABLET    Take 1 tablet by mouth every 6 (six) hours as needed for severe pain.     Note:  This document was prepared using Dragon voice recognition software and may include unintentional dictation errors.    Sable Feil, PA-C 06/27/16 [place], MD 06/27/16 1332

## 2016-06-27 NOTE — ED Notes (Signed)
Pt reports she does wish to file workmans comp.

## 2016-06-27 NOTE — ED Triage Notes (Signed)
Pt in via EMS from work.  Pt reports working at [employer]; states she was moving furniture yesterday at work without any difficulty or pain.  Pt reports waking up this morning with severe lower back pain, states it took her 20 minutes to get her clothes on, but was able to drive to work.  Pt moaning/groaning in pain upon arrival, states pain is worse with movement.  Pt placed in recliner at this time.

## 2016-07-17 LAB — HM HIV SCREENING LAB: HM HIV Screening: NEGATIVE

## 2017-02-03 ENCOUNTER — Emergency Department
Admission: EM | Admit: 2017-02-03 | Discharge: 2017-02-03 | Disposition: A | Discharge status: Home / Self Care | Payer: Self-pay | Attending: Emergency Medicine | Admitting: Emergency Medicine

## 2017-02-03 ENCOUNTER — Encounter: Payer: Self-pay | Admitting: Emergency Medicine

## 2017-02-03 DIAGNOSIS — M545 Low back pain, unspecified: Secondary | ICD-10-CM

## 2017-02-03 DIAGNOSIS — F17210 Nicotine dependence, cigarettes, uncomplicated: Secondary | ICD-10-CM | POA: Insufficient documentation

## 2017-02-03 LAB — URINALYSIS, COMPLETE (UACMP) WITH MICROSCOPIC
Bacteria, UA: NONE SEEN
Bilirubin Urine: NEGATIVE
Glucose, UA: NEGATIVE mg/dL
Hgb urine dipstick: NEGATIVE
Ketones, ur: NEGATIVE mg/dL
Nitrite: NEGATIVE
Protein, ur: NEGATIVE mg/dL
Specific Gravity, Urine: 1.012 (ref 1.005–1.030)
pH: 7 (ref 5.0–8.0)

## 2017-02-03 LAB — POCT PREGNANCY, URINE: PREG TEST UR: NEGATIVE

## 2017-02-03 MED ORDER — KETOROLAC TROMETHAMINE 30 MG/ML IJ SOLN
30.0000 mg | Freq: Once | INTRAMUSCULAR | Status: AC
  Administered 2017-02-03: 30 mg via INTRAMUSCULAR
  Filled 2017-02-03: qty 1

## 2017-02-03 MED ORDER — TRAMADOL HCL 50 MG PO TABS: 50.0000 mg | ORAL_TABLET | Freq: Four times a day (QID) | ORAL | 0 refills | Status: DC | PRN

## 2017-02-03 MED ORDER — METHOCARBAMOL 500 MG PO TABS: 500.0000 mg | ORAL_TABLET | Freq: Four times a day (QID) | ORAL | 0 refills | Status: DC

## 2017-02-03 NOTE — ED Notes (Signed)

## 2017-02-03 NOTE — ED Triage Notes (Signed)
Patient presents to the ED with lower back pain x 1 week.  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  Patient denies history of chronic back issues.  Patient appears slightly uncomfortable in triage, rocking while sitting.  Patient denies dysuria and states pain is worse with movement.

## 2017-02-03 NOTE — Discharge Instructions (Signed)
Follow-up through primary care doctor for your back pain. Call tomorrow to make an appointment for the next 1-2 weeks. Begin taking Robaxin 500 mg 4 times a day as needed for muscle spasms. Tramadol 50 mg 1 every 6 hours as needed for pain.  You  may also take ibuprofen with these 2 medications.

## 2017-02-03 NOTE — ED Provider Notes (Signed)
[facility] Emergency Department Provider Note  ____________________________________________   First MD Initiated Contact with Patient 02/03/17 1950     (approximate)  I have reviewed the triage vital signs and the nursing notes.   HISTORY  Chief Complaint Back Pain   HPI Carol Crawford is a 41 y.o. female is here complaining of low back pain for one week. Patient states that she has a history of chronic back problems. 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 She denies any dysuria and states the pain is worse with movement. She denies any paresthesias to her lower extremities, incontinence of bowel or bladder, or saddle anesthesias. Patient continues to ambulate without assistance. Patient is taken over-the-counter medication without any relief of her pain.currently she rates her pain as an 8 out of 10.   Past Medical History:  Diagnosis Date  . Bipolar 1 disorder ([place])   . Depressed     Patient Active Problem List   Diagnosis Date Noted  . Depressed   . Bipolar 1 disorder ([place])     History reviewed. No pertinent surgical history.  Prior to Admission medications   Medication Sig Start Date End Date Taking? Authorizing Provider  methocarbamol (ROBAXIN) 500 MG tablet Take 1 tablet (500 mg total) 4 (four) times daily by mouth. 02/03/17   Johnn Hai, PA-C  traMADol (ULTRAM) 50 MG tablet Take 1 tablet (50 mg total) every 6 (six) hours as needed by mouth. 02/03/17   Johnn Hai, PA-C    Allergies Patient has no known allergies.  No family history on file.  Social History Social History   Tobacco Use  . Smoking status: Current Every Day Smoker    Packs/day: 0.50    Types: Cigarettes  . Smokeless tobacco: Never Used  Substance Use Topics  . Alcohol use: Yes    Comment: occasionally  . Drug use: Not on file    Review of Systems Constitutional: No  fever/chills Cardiovascular: Denies chest pain. Respiratory: Denies shortness of breath. Gastrointestinal: No abdominal pain.  No nausea, no vomiting. Genitourinary: Negative for dysuria. Musculoskeletal: positive for acute and chronic back pain. Skin: Negative for rash. Neurological: Negative for focal weakness or numbness. ____________________________________________   PHYSICAL EXAM:  VITAL SIGNS: ED Triage Vitals  Enc Vitals Group     BP 02/03/17 1823 131/73     Pulse Rate 02/03/17 1823 66     Resp 02/03/17 1823 18     Temp 02/03/17 1823 97.8 F (36.6 C)     Temp Source 02/03/17 1823 Oral     SpO2 02/03/17 1823 100 %     Weight 02/03/17 1824 158 lb (71.7 kg)     Height 02/03/17 1824 5\' 7"  (1.702 m)     Head Circumference --      Peak Flow --      Pain Score 02/03/17 1823 8     Pain Loc --      Pain Edu? --      Excl. in [place]? --    Constitutional: Alert and oriented. Well appearing and in no acute distress. Eyes: Conjunctivae are normal.  Head: Atraumatic. Neck: No stridor.   Cardiovascular: Normal rate, regular rhythm. Grossly normal heart sounds.  Good peripheral circulation. Respiratory: Normal respiratory effort.  No retractions. Lungs CTAB. Gastrointestinal: Soft and nontender. No distention.  No CVA tenderness. Musculoskeletal: examination of the low back there is no gross  deformity noted. There is diffuse tenderness on palpation of the lumbar spine and paravertebral muscles bilaterally. Range of motion is guarded.  Good muscle strength bilaterally. Patient is ambulatory without assistance. Neurologic:  Normal speech and language. No gross focal neurologic deficits are appreciated. No gait instability. Skin:  Skin is warm, dry and intact. No rash noted. Psychiatric: Mood and affect are normal. Speech and behavior are normal.  ____________________________________________   LABS (all labs ordered are listed, but only abnormal results are displayed)  Labs Reviewed   URINALYSIS, COMPLETE (UACMP) WITH MICROSCOPIC - Abnormal; Notable for the following components:      Result Value   Color, Urine YELLOW (*)    APPearance HAZY (*)    Leukocytes, UA TRACE (*)    Squamous Epithelial / LPF 0-5 (*)    All other components within normal limits  POC URINE PREG, ED  POCT PREGNANCY, URINE    PROCEDURES  Procedure(s) performed: None  Procedures  Critical Care performed: No  ____________________________________________   INITIAL IMPRESSION / ASSESSMENT AND PLAN / ED COURSE  Patient was given Toradol 30 mg IM while in the department. Urinalysis was reassuring. Patient was getting relief of her back pain with the Toradol injection. Patient was discharged with a prescription for tramadol 1 every 6 hours as needed for pain and Robaxin 500 mg 1 every 6 hours as needed for muscle spasms. She is to use ice or heat to her back as needed for discomfort. She is to call and make an appointment with her PCP if any continued problems with her back.   ____________________________________________   FINAL CLINICAL IMPRESSION(S) / ED DIAGNOSES  Final diagnoses:  Acute bilateral low back pain without sciatica     ED Discharge Orders        Ordered    methocarbamol (ROBAXIN) 500 MG tablet  4 times daily     02/03/17 2133    traMADol (ULTRAM) 50 MG tablet  Every 6 hours PRN     02/03/17 2133       Note:  This document was prepared using Dragon voice recognition software and may include unintentional dictation errors.    Johnn Hai, PA-C 02/03/17 2143    Johnn Hai, PA-C 02/03/17 2148    Rudene Re, MD 02/06/17 [phone]

## 2017-03-09 ENCOUNTER — Encounter: Payer: Self-pay | Admitting: Emergency Medicine

## 2017-03-09 ENCOUNTER — Other Ambulatory Visit: Payer: Self-pay

## 2017-03-09 ENCOUNTER — Emergency Department
Admission: EM | Admit: 2017-03-09 | Discharge: 2017-03-09 | Disposition: A | Discharge status: Home / Self Care | Payer: Self-pay | Attending: Emergency Medicine | Admitting: Emergency Medicine

## 2017-03-09 DIAGNOSIS — F319 Bipolar disorder, unspecified: Secondary | ICD-10-CM | POA: Insufficient documentation

## 2017-03-09 DIAGNOSIS — Z79899 Other long term (current) drug therapy: Secondary | ICD-10-CM | POA: Insufficient documentation

## 2017-03-09 DIAGNOSIS — K644 Residual hemorrhoidal skin tags: Secondary | ICD-10-CM

## 2017-03-09 DIAGNOSIS — F17210 Nicotine dependence, cigarettes, uncomplicated: Secondary | ICD-10-CM | POA: Insufficient documentation

## 2017-03-09 DIAGNOSIS — K648 Other hemorrhoids: Secondary | ICD-10-CM | POA: Insufficient documentation

## 2017-03-09 MED ORDER — OXYCODONE-ACETAMINOPHEN 10-325 MG PO TABS: 1.0000 | ORAL_TABLET | Freq: Three times a day (TID) | ORAL | 0 refills | Status: AC | PRN

## 2017-03-09 MED ORDER — HYDROCORTISONE ACETATE 25 MG RE SUPP
25.0000 mg | Freq: Once | RECTAL | Status: AC
  Administered 2017-03-09: 25 mg via RECTAL
  Filled 2017-03-09: qty 1

## 2017-03-09 MED ORDER — LIDOCAINE HCL 2 % EX GEL
1.0000 "application " | Freq: Once | CUTANEOUS | Status: AC
  Administered 2017-03-09: 1 via TOPICAL

## 2017-03-09 MED ORDER — PREDNISONE 10 MG PO TABS: 10.0000 mg | ORAL_TABLET | Freq: Two times a day (BID) | ORAL | 0 refills | Status: DC

## 2017-03-09 MED ORDER — HYDROCORTISONE ACETATE 25 MG RE SUPP: 25.0000 mg | Freq: Two times a day (BID) | RECTAL | 1 refills | Status: AC

## 2017-03-09 MED ORDER — OXYCODONE-ACETAMINOPHEN 5-325 MG PO TABS
2.0000 | ORAL_TABLET | Freq: Once | ORAL | Status: AC
  Administered 2017-03-09: 2 via ORAL
  Filled 2017-03-09: qty 2

## 2017-03-09 MED ORDER — LIDOCAINE HCL 2 % EX GEL
CUTANEOUS | Status: AC
  Filled 2017-03-09: qty 10

## 2017-03-09 MED ORDER — POLYETHYLENE GLYCOL 3350 17 G PO PACK
17.0000 g | PACK | Freq: Every day | ORAL | Status: DC
  Administered 2017-03-09: 17 g via ORAL
  Filled 2017-03-09: qty 1

## 2017-03-09 MED ORDER — DIBUCAINE 1 % RE OINT: 1.0000 "application " | TOPICAL_OINTMENT | Freq: Three times a day (TID) | RECTAL | 1 refills | Status: DC | PRN

## 2017-03-09 NOTE — ED Notes (Signed)
Pt states today began to have rectal pain and noticed "things" coming from rectum. Pt has what appears to be several areas of swollen flesh colored balls like a cluster of grapes extending from rectum. Pt complains of "severe" pain. Pt appears uncomfortable.

## 2017-03-09 NOTE — ED Triage Notes (Signed)
Pt presents to ED via POV c/o rectal pain from possible abscess. States she has history of "blood clot" in same area and had it drained in ED, felt knot earlier today and tried to squeeze it. States now she is unable to sit at all d/t pain.

## 2017-03-09 NOTE — ED Provider Notes (Signed)
[facility] Emergency Department Provider Note ____________________________________________  Time seen: 2039  I have reviewed the triage vital signs and the nursing notes.  HISTORY  Chief Complaint  Rectal Pain and Abscess  HPI Carol Crawford is a 41 y.o. female presents to the ED ED for evaluation of acute, severe rectal pain. She gives a history of a thrombosed hemorrhoid lanced a few years ago. She denies bleeding hemorrhoids or chronic constipation. She noticed a firm knot to the rectum after sitting to stool earlier. She noted spasms and pain even with attempts to pass gas. She is now unable to sit due to the discomfort.   Past Medical History:  Diagnosis Date  . Bipolar 1 disorder ([place])   . Depressed     Patient Active Problem List   Diagnosis Date Noted  . Depressed   . Bipolar 1 disorder ([place])     History reviewed. No pertinent surgical history.  Prior to Admission medications   Medication Sig Start Date End Date Taking? Authorizing Provider  dibucaine (NUPERCAINAL) 1 % OINT Place 1 application rectally 3 (three) times daily as needed for hemorrhoids. 03/09/17   Shaliah Wann, Dannielle Karvonen, PA-C  hydrocortisone (ANUSOL-HC) 25 MG suppository Place 1 suppository (25 mg total) rectally every 12 (twelve) hours for 12 days. 03/09/17 03/21/17  Kanija Remmel, Dannielle Karvonen, PA-C  methocarbamol (ROBAXIN) 500 MG tablet Take 1 tablet (500 mg total) 4 (four) times daily by mouth. 02/03/17   Johnn Hai, PA-C  oxyCODONE-acetaminophen (PERCOCET) 10-325 MG tablet Take 1 tablet by mouth every 8 (eight) hours as needed for up to 7 days for pain. 03/09/17 03/16/17  Tully Burgo, Dannielle Karvonen, PA-C  predniSONE (DELTASONE) 10 MG tablet Take 1 tablet (10 mg total) by mouth 2 (two) times daily with a meal. 03/09/17   Felicidad Sugarman, Dannielle Karvonen, PA-C  traMADol (ULTRAM) 50 MG tablet Take 1 tablet (50 mg total) every 6 (six) hours as needed by mouth. 02/03/17   Johnn Hai,  PA-C    Allergies Shrimp [shellfish allergy]  History reviewed. No pertinent family history.  Social History Social History   Tobacco Use  . Smoking status: Current Every Day Smoker    Packs/day: 0.50    Types: Cigarettes  . Smokeless tobacco: Never Used  Substance Use Topics  . Alcohol use: Yes    Comment: occasionally  . Drug use: Not on file    Review of Systems  Constitutional: Negative for fever. Cardiovascular: Negative for chest pain. Respiratory: Negative for shortness of breath. Gastrointestinal: Negative for abdominal pain, vomiting and diarrhea. Rectal pain as above. Genitourinary: Negative for dysuria. ____________________________________________  PHYSICAL EXAM:  VITAL SIGNS: ED Triage Vitals  Enc Vitals Group     BP 03/09/17 1956 (!) 126/100     Pulse Rate 03/09/17 1956 87     Resp 03/09/17 1956 18     Temp 03/09/17 1956 98.1 F (36.7 C)     Temp Source 03/09/17 1956 Oral     SpO2 03/09/17 1956 99 %     Weight 03/09/17 1959 157 lb (71.2 kg)     Height 03/09/17 1959 5\' 7"  (1.702 m)     Head Circumference --      Peak Flow --      Pain Score 03/09/17 1959 10     Pain Loc --      Pain Edu? --      Excl. in [place]? --     Constitutional: Alert and oriented.  Well appearing and in no distress. Head: Normocephalic and atraumatic. Cardiovascular: Normal rate, regular rhythm. Normal distal pulses. Respiratory: Normal respiratory effort. No wheezes/rales/rhonchi. Rectal: Patient with multiple, enlarged, firm, prolapsed external hemorrhoids noted.  Skin is pink and moist in color. Skin:  Skin is warm, dry and intact. No rash noted. ____________________________________________  PROCEDURES  Procedures Polyethylene glycol 17 g PO Percocet 5-325 mg ii PO 2% lido jelly PR Hydrocortisone suppository PR ____________________________________________  INITIAL IMPRESSION / ASSESSMENT AND PLAN / ED COURSE  Patient with ED evaluation of acute rectal pain  secondary to multiple engorged external hemorrhoids.  Patient without any thrombosed, incarcerated, or strangulated hemorrhoids on exam.  She is discharged after pain medicines, topical lidocaine jelly, and a cortisone suppository is instilled in the rectum.  She will be discharged with prescriptions for Percocet, hydrocortisone suppositories, dibucaine ointment, and prednisone.  She is also encouraged to take a daily stool softener and avoid meats, cheeses, and breads.  She will be referred to GI for ongoing treatment and definitive management of external hemorrhoids. ____________________________________________  FINAL CLINICAL IMPRESSION(S) / ED DIAGNOSES  Final diagnoses:  Prolapsed external hemorrhoids      Carmie End, Dannielle Karvonen, PA-C 03/10/17 0031    Nena Polio, MD 03/14/17 [phone]

## 2017-03-09 NOTE — Discharge Instructions (Signed)
Your exam is consistent with a severe hemorrhoids. They do not appear to be thrombosed and requiring any incision and drainage at this time. Take the meds as directed. Follow-up with Gastroenterology as discussed.

## 2017-06-22 ENCOUNTER — Encounter: Payer: Self-pay | Admitting: Emergency Medicine

## 2017-06-22 ENCOUNTER — Emergency Department
Admission: EM | Admit: 2017-06-22 | Discharge: 2017-06-22 | Disposition: A | Discharge status: Home / Self Care | Payer: Self-pay | Attending: Emergency Medicine | Admitting: Emergency Medicine

## 2017-06-22 ENCOUNTER — Other Ambulatory Visit: Payer: Self-pay

## 2017-06-22 DIAGNOSIS — J020 Streptococcal pharyngitis: Secondary | ICD-10-CM | POA: Insufficient documentation

## 2017-06-22 DIAGNOSIS — F17210 Nicotine dependence, cigarettes, uncomplicated: Secondary | ICD-10-CM | POA: Insufficient documentation

## 2017-06-22 DIAGNOSIS — Z79899 Other long term (current) drug therapy: Secondary | ICD-10-CM | POA: Insufficient documentation

## 2017-06-22 LAB — GROUP A STREP BY PCR: GROUP A STREP BY PCR: DETECTED — AB

## 2017-06-22 MED ORDER — LIDOCAINE VISCOUS 2 % MT SOLN: 10.0000 mL | OROMUCOSAL | 0 refills | Status: DC | PRN

## 2017-06-22 MED ORDER — AMOXICILLIN-POT CLAVULANATE 875-125 MG PO TABS: 1.0000 | ORAL_TABLET | Freq: Two times a day (BID) | ORAL | 0 refills | Status: AC

## 2017-06-22 MED ORDER — LIDOCAINE VISCOUS 2 % MT SOLN
15.0000 mL | Freq: Once | OROMUCOSAL | Status: AC
  Administered 2017-06-22: 15 mL via OROMUCOSAL
  Filled 2017-06-22: qty 15

## 2017-06-22 NOTE — ED Notes (Signed)
See triage note  Presents with sore throat since Friday  States she developed pain after eating some ice at work   States she noticed some black stuff in cup  Throat red  And now having some increased pain with swallowing

## 2017-06-22 NOTE — ED Provider Notes (Signed)
[facility] Emergency Department Provider Note  ____________________________________________  Time seen: Approximately 12:12 PM  I have reviewed the triage vital signs and the nursing notes.   HISTORY  Chief Complaint Sore Throat    HPI Carol Crawford is a 42 y.o. female that presents emergency department for evaluation of sore throat for 3 days.  Patient states that she was eating ice at work when her supervisor told her that there was mold in the ice machine.  Shortly after, her sore throat started to become irritated and painful. No sick contacts. She has been taking cough drops and throat sprays without relief.  No fever, chills, nausea, vomiting, abdominal pain.   Past Medical History:  Diagnosis Date  . Bipolar 1 disorder ([place])   . Depressed     Patient Active Problem List   Diagnosis Date Noted  . Depressed   . Bipolar 1 disorder ([place])     History reviewed. No pertinent surgical history.  Prior to Admission medications   Medication Sig Start Date End Date Taking? Authorizing Provider  amoxicillin-clavulanate (AUGMENTIN) 875-125 MG tablet Take 1 tablet by mouth 2 (two) times daily for 10 days. 06/22/17 07/02/17  Laban Emperor, PA-C  dibucaine (NUPERCAINAL) 1 % OINT Place 1 application rectally 3 (three) times daily as needed for hemorrhoids. 03/09/17   Menshew, Dannielle Karvonen, PA-C  lidocaine (XYLOCAINE) 2 % solution Use as directed 10 mLs in the mouth or throat as needed for mouth pain. 06/22/17   Laban Emperor, PA-C  methocarbamol (ROBAXIN) 500 MG tablet Take 1 tablet (500 mg total) 4 (four) times daily by mouth. 02/03/17   Johnn Hai, PA-C  predniSONE (DELTASONE) 10 MG tablet Take 1 tablet (10 mg total) by mouth 2 (two) times daily with a meal. 03/09/17   Menshew, Dannielle Karvonen, PA-C  traMADol (ULTRAM) 50 MG tablet Take 1 tablet (50 mg total) every 6 (six) hours as needed by mouth. 02/03/17   Johnn Hai, PA-C     Allergies Shrimp [shellfish allergy] and Tomato  No family history on file.  Social History Social History   Tobacco Use  . Smoking status: Current Every Day Smoker    Packs/day: 0.25    Types: Cigarettes  . Smokeless tobacco: Never Used  Substance Use Topics  . Alcohol use: Yes    Comment: occasionally  . Drug use: Not on file     Review of Systems  Constitutional: No fever/chills Eyes: No visual changes. No discharge. ENT: Negative for congestion and rhinorrhea. Cardiovascular: No chest pain. Respiratory: Negative for cough. No SOB. Gastrointestinal: No abdominal pain.  No nausea, no vomiting.  No diarrhea.  No constipation. Musculoskeletal: Negative for musculoskeletal pain. Skin: Negative for rash, abrasions, lacerations, ecchymosis. Neurological: Negative for headaches.   ____________________________________________   PHYSICAL EXAM:  VITAL SIGNS: ED Triage Vitals [06/22/17 1133]  Enc Vitals Group     BP 114/87     Pulse Rate 79     Resp 18     Temp 98.2 F (36.8 C)     Temp Source Oral     SpO2 100 %     Weight 156 lb (70.8 kg)     Height 5\' 7"  (1.702 m)     Head Circumference      Peak Flow      Pain Score 8     Pain Loc      Pain Edu?      Excl. in [place]?  Constitutional: Alert and oriented. Well appearing and in no acute distress. Eyes: Conjunctivae are normal. PERRL. EOMI. No discharge. Head: Atraumatic. ENT: No frontal and maxillary sinus tenderness.      Ears: Tympanic membranes pearly gray with good landmarks. No discharge.      Nose: No congestion/rhinnorhea.      Mouth/Throat: Mucous membranes are moist. Oropharynx erythematous. Tonsils not enlarged. No exudates. Uvula midline. Neck: No stridor.   Hematological/Lymphatic/Immunilogical: No cervical lymphadenopathy. Cardiovascular: Normal rate, regular rhythm.  Good peripheral circulation. Respiratory: Normal respiratory effort without tachypnea or retractions. Lungs CTAB. Good  air entry to the bases with no decreased or absent breath sounds. Gastrointestinal: Bowel sounds 4 quadrants. Soft and nontender to palpation. No guarding or rigidity. No palpable masses. No distention. Musculoskeletal: Full range of motion to all extremities. No gross deformities appreciated. Neurologic:  Normal speech and language. No gross focal neurologic deficits are appreciated.  Skin:  Skin is warm, dry and intact. No rash noted.   ____________________________________________   LABS (all labs ordered are listed, but only abnormal results are displayed)  Labs Reviewed  GROUP A STREP BY PCR - Abnormal; Notable for the following components:      Result Value   Group A Strep by PCR DETECTED (*)    All other components within normal limits   ____________________________________________  EKG   ____________________________________________  RADIOLOGY   No results found.  ____________________________________________    PROCEDURES  Procedure(s) performed:    Procedures    Medications  lidocaine (XYLOCAINE) 2 % viscous mouth solution 15 mL (15 mLs Mouth/Throat Given 06/22/17 1222)     ____________________________________________   INITIAL IMPRESSION / ASSESSMENT AND PLAN / ED COURSE  Pertinent labs & imaging results that were available during my care of the patient were reviewed by me and considered in my medical decision making (see chart for details).  Review of the [place] CSRS was performed in accordance of the [place] prior to dispensing any controlled drugs.     Patient's diagnosis is consistent with strep pharyngitis. Vital signs and exam are reassuring. Strep test positive. Patient appears well and is staying well hydrated. Patient feels comfortable going home. Patient will be discharged home with prescriptions for augmentin and xylocaine mouthwash. Patient is to follow up with PCP as needed or otherwise directed. Patient is given ED precautions to return to  the ED for any worsening or new symptoms.     ____________________________________________  FINAL CLINICAL IMPRESSION(S) / ED DIAGNOSES  Final diagnoses:  Strep throat      NEW MEDICATIONS STARTED DURING THIS VISIT:  ED Discharge Orders        Ordered    amoxicillin-clavulanate (AUGMENTIN) 875-125 MG tablet  2 times daily     06/22/17 1331    lidocaine (XYLOCAINE) 2 % solution  As needed     06/22/17 1331          This chart was dictated using voice recognition software/Dragon. Despite best efforts to proofread, errors can occur which can change the meaning. Any change was purely unintentional.    Laban Emperor, PA-C 06/22/17 1353    Nena Polio, MD 06/22/17 [phone]

## 2017-06-22 NOTE — ED Triage Notes (Signed)
Pt arrived via POV from home with reports of sore throat that started Friday night, pt states she was eating ice at work and noticed black stuff in the cup, states the manager told her it was mold from the ice machine.  Pt states she has pain when swallowing and states she does feel drainage down the back of her throat.

## 2017-08-06 LAB — HM PAP SMEAR: HM Pap smear: NEGATIVE

## 2017-08-18 ENCOUNTER — Emergency Department: Payer: Self-pay

## 2017-08-18 ENCOUNTER — Emergency Department
Admission: EM | Admit: 2017-08-18 | Discharge: 2017-08-18 | Disposition: A | Discharge status: Home / Self Care | Payer: Medicaid Other | Attending: Emergency Medicine | Admitting: Emergency Medicine

## 2017-08-18 ENCOUNTER — Encounter: Payer: Self-pay | Admitting: Emergency Medicine

## 2017-08-18 ENCOUNTER — Emergency Department
Admission: EM | Admit: 2017-08-18 | Discharge: 2017-08-18 | Disposition: A | Discharge status: Home / Self Care | Payer: Self-pay | Attending: Emergency Medicine | Admitting: Emergency Medicine

## 2017-08-18 ENCOUNTER — Other Ambulatory Visit: Payer: Self-pay

## 2017-08-18 DIAGNOSIS — W25XXXA Contact with sharp glass, initial encounter: Secondary | ICD-10-CM | POA: Insufficient documentation

## 2017-08-18 DIAGNOSIS — W208XXA Other cause of strike by thrown, projected or falling object, initial encounter: Secondary | ICD-10-CM | POA: Insufficient documentation

## 2017-08-18 DIAGNOSIS — Y939 Activity, unspecified: Secondary | ICD-10-CM | POA: Insufficient documentation

## 2017-08-18 DIAGNOSIS — S91312A Laceration without foreign body, left foot, initial encounter: Secondary | ICD-10-CM | POA: Insufficient documentation

## 2017-08-18 DIAGNOSIS — F10920 Alcohol use, unspecified with intoxication, uncomplicated: Secondary | ICD-10-CM | POA: Insufficient documentation

## 2017-08-18 DIAGNOSIS — F17210 Nicotine dependence, cigarettes, uncomplicated: Secondary | ICD-10-CM | POA: Insufficient documentation

## 2017-08-18 DIAGNOSIS — Y999 Unspecified external cause status: Secondary | ICD-10-CM | POA: Insufficient documentation

## 2017-08-18 DIAGNOSIS — Y92002 Bathroom of unspecified non-institutional (private) residence single-family (private) house as the place of occurrence of the external cause: Secondary | ICD-10-CM | POA: Insufficient documentation

## 2017-08-18 DIAGNOSIS — S91312D Laceration without foreign body, left foot, subsequent encounter: Secondary | ICD-10-CM | POA: Insufficient documentation

## 2017-08-18 HISTORY — DX: Attention-deficit hyperactivity disorder, unspecified type: F90.9

## 2017-08-18 HISTORY — DX: Schizophrenia, unspecified: F20.9

## 2017-08-18 LAB — CBC WITH DIFFERENTIAL/PLATELET
BASOS ABS: 0 10*3/uL (ref 0–0.1)
Basophils Relative: 1 %
Eosinophils Absolute: 0 10*3/uL (ref 0–0.7)
Eosinophils Relative: 1 %
HEMATOCRIT: 40.4 % (ref 35.0–47.0)
Hemoglobin: 14.1 g/dL (ref 12.0–16.0)
LYMPHS PCT: 47 %
Lymphs Abs: 2.6 10*3/uL (ref 1.0–3.6)
MCH: 33.6 pg (ref 26.0–34.0)
MCHC: 35 g/dL (ref 32.0–36.0)
MCV: 96.1 fL (ref 80.0–100.0)
Monocytes Absolute: 0.2 10*3/uL (ref 0.2–0.9)
Monocytes Relative: 5 %
NEUTROS ABS: 2.5 10*3/uL (ref 1.4–6.5)
NEUTROS PCT: 46 %
PLATELETS: 291 10*3/uL (ref 150–440)
RBC: 4.2 MIL/uL (ref 3.80–5.20)
RDW: 12.6 % (ref 11.5–14.5)
WBC: 5.3 10*3/uL (ref 3.6–11.0)

## 2017-08-18 LAB — BASIC METABOLIC PANEL
Anion gap: 8 (ref 5–15)
BUN: 10 mg/dL (ref 6–20)
CALCIUM: 9.3 mg/dL (ref 8.9–10.3)
CO2: 21 mmol/L — AB (ref 22–32)
CREATININE: 0.99 mg/dL (ref 0.44–1.00)
Chloride: 111 mmol/L (ref 101–111)
GFR calc Af Amer: >60 mL/min (ref >60)
GFR calc non Af Amer: >60 mL/min (ref >60)
GLUCOSE: 95 mg/dL (ref 65–99)
Potassium: 3.4 mmol/L — ABNORMAL LOW (ref 3.5–5.1)
Sodium: 140 mmol/L (ref 135–145)

## 2017-08-18 LAB — ETHANOL: Alcohol, Ethyl (B): 196 mg/dL — ABNORMAL HIGH (ref <10)

## 2017-08-18 MED ORDER — TRAMADOL HCL 50 MG PO TABS: 50.0000 mg | ORAL_TABLET | Freq: Four times a day (QID) | ORAL | 0 refills | Status: DC | PRN

## 2017-08-18 MED ORDER — SULFAMETHOXAZOLE-TRIMETHOPRIM 800-160 MG PO TABS: 1.0000 | ORAL_TABLET | Freq: Two times a day (BID) | ORAL | 0 refills | Status: DC

## 2017-08-18 MED ORDER — SULFAMETHOXAZOLE-TRIMETHOPRIM 800-160 MG PO TABS
1.0000 | ORAL_TABLET | Freq: Once | ORAL | Status: AC
  Administered 2017-08-18: 1 via ORAL
  Filled 2017-08-18: qty 1

## 2017-08-18 MED ORDER — SODIUM CHLORIDE 0.9 % IV BOLUS
1000.0000 mL | Freq: Once | INTRAVENOUS | Status: AC
  Administered 2017-08-18: 1000 mL via INTRAVENOUS

## 2017-08-18 MED ORDER — LORAZEPAM 2 MG/ML IJ SOLN
0.5000 mg | Freq: Once | INTRAMUSCULAR | Status: AC
  Administered 2017-08-18: 0.5 mg via INTRAVENOUS
  Filled 2017-08-18: qty 1

## 2017-08-18 NOTE — ED Provider Notes (Signed)
[facility] Emergency Department Provider Note   ____________________________________________   First MD Initiated Contact with Patient 08/18/17 [phone]     (approximate)  I have reviewed the triage vital signs and the nursing notes.   HISTORY  Chief Complaint Laceration    HPI Carol Crawford is a 42 y.o. female brought to the ED from home with a chief complaint of left foot laceration.  Patient admits to heavy EtOH use tonight; states she was in the bathroom and knocked a mirror off the wall.  When the mirror fell, it broke into pieces and gashed top of her left foot.  Complains of pain and laceration to her left foot.  Denies numbness/tingling.  Denies striking head or LOC.  Voices no other complaints or injuries.   Past Medical History:  Diagnosis Date  . ADHD   . Bipolar 1 disorder ([place])   . Depressed   . Schizophrenia [facility])     Patient Active Problem List   Diagnosis Date Noted  . Depressed   . Bipolar 1 disorder ([place])     History reviewed. No pertinent surgical history.  Prior to Admission medications   Medication Sig Start Date End Date Taking? Authorizing Provider  sulfamethoxazole-trimethoprim (BACTRIM DS,SEPTRA DS) 800-160 MG tablet Take 1 tablet by mouth 2 (two) times daily. 08/18/17   Paulette Blanch, MD    Allergies Penicillins; Shrimp [shellfish allergy]; and Tomato  History reviewed. No pertinent family history.  Social History Social History   Tobacco Use  . Smoking status: Current Every Day Smoker    Packs/day: 1.00    Types: Cigarettes  . Smokeless tobacco: Never Used  Substance Use Topics  . Alcohol use: Yes    Comment: occasionally  . Drug use: Yes    Types: Marijuana    Comment: last used 2 days ago    Review of Systems  Constitutional: No fever/chills Eyes: No visual changes. ENT: No sore throat. Cardiovascular: Denies chest pain. Respiratory: Denies shortness of breath. Gastrointestinal: No abdominal  pain.  No nausea, no vomiting.  No diarrhea.  No constipation. Genitourinary: Negative for dysuria. Musculoskeletal: For left foot laceration.  Negative for back pain. Skin: Negative for rash. Neurological: Negative for headaches, focal weakness or numbness.   ____________________________________________   PHYSICAL EXAM:  VITAL SIGNS: ED Triage Vitals  Enc Vitals Group     BP --      Pulse Rate 08/18/17 0329 97     Resp 08/18/17 0329 18     Temp 08/18/17 0329 98.3 F (36.8 C)     Temp Source 08/18/17 0329 Oral     SpO2 08/18/17 0329 98 %     Weight 08/18/17 0330 157 lb (71.2 kg)     Height 08/18/17 0330 5\' 7"  (1.702 m)     Head Circumference --      Peak Flow --      Pain Score 08/18/17 0329 10     Pain Loc --      Pain Edu? --      Excl. in [place]? --     Constitutional: Alert and oriented.  Intoxicated appearing and in mild acute distress.  Hysterical and anxious. Eyes: Conjunctivae are normal. PERRL. EOMI. Head: Atraumatic. Nose: No congestion/rhinnorhea. Mouth/Throat: Mucous membranes are moist.  Oropharynx non-erythematous. Neck: No stridor.  No cervical spine tenderness to palpation. Cardiovascular: Normal rate, regular rhythm. Grossly normal heart sounds.  Good peripheral circulation. Respiratory: Normal respiratory effort.  No retractions. Lungs CTAB. Gastrointestinal:  Soft and nontender. No distention. No abdominal bruits. No CVA tenderness. Musculoskeletal:  Left foot: Approximately 3 x 2 cm wound to dorsum of left foot.  Gaping wound; piece of tissue missing so wound edges will not come together.  Fatty tissue visualized.  Wound does not appear to breach fascia.  No tendon injury noted.  2+ pulses.  Brisk, less than 5-second cap refill.  Able to wiggle toes. Neurologic:  Normal speech and language. No gross focal neurologic deficits are appreciated.  Skin:  Skin is warm, dry and intact. No rash noted. Psychiatric: Mood and affect are normal. Speech and behavior are  normal.  ____________________________________________   LABS (all labs ordered are listed, but only abnormal results are displayed)  Labs Reviewed  BASIC METABOLIC PANEL - Abnormal; Notable for the following components:      Result Value   Potassium 3.4 (*)    CO2 21 (*)    All other components within normal limits  ETHANOL - Abnormal; Notable for the following components:   Alcohol, Ethyl (B) 196 (*)    All other components within normal limits  CBC WITH DIFFERENTIAL/PLATELET   ____________________________________________  EKG  None ____________________________________________  RADIOLOGY  ED MD interpretation: No radiopaque foreign body  Official radiology report(s): Dg Foot Complete Left  Result Date: 08/18/2017 CLINICAL DATA:  Laceration to the left foot from broken mirror. EXAM: LEFT FOOT - COMPLETE 3+ VIEW COMPARISON:  11/17/2015 FINDINGS: Soft tissue defect over the dorsum of the left foot consistent with history of laceration. No radiopaque soft tissue foreign bodies. No evidence of acute fracture or dislocation in the left foot. No focal bone lesion or bone destruction. Old ununited ossicle inferior to the lateral malleolus. IMPRESSION: Soft tissue laceration over the dorsum of the left foot. No radiopaque foreign bodies. No acute bony abnormalities. Electronically Signed   By: Lucienne Capers M.D.   On: 08/18/2017 04:34    ____________________________________________   PROCEDURES  Procedure(s) performed: None  Procedures  Critical Care performed: No  ____________________________________________   INITIAL IMPRESSION / ASSESSMENT AND PLAN / ED COURSE  As part of my medical decision making, I reviewed the following data within the [place] notes reviewed and incorporated, Radiograph reviewed and Notes from prior ED visits   42 year old intoxicated female who presents with left foot laceration.  Will obtain x-rays to evaluate for  glass foreign body.  Given her level of intoxication, patient will require IV fluids.  Low-dose Ativan given for calming agents.  Tetanus is up-to-date, less than 5 years.  Friend arrives to bedside and confirms with me that there is a chunk of tissue left at the scene.  Discussed with patient her wound is not amenable to sutures.  Will cleanse thoroughly and apply nonadherent dressing.   Clinical Course as of Aug 18 716  Mon Aug 18, 2017  0458 Updated patient and friend of laboratory and imaging results.  Wound cleaned and dressed by nursing.  Will discharge after completion of IV fluids.   [JS]    Clinical Course User Index [JS] Paulette Blanch, MD     ____________________________________________   FINAL CLINICAL IMPRESSION(S) / ED DIAGNOSES  Final diagnoses:  Laceration of left foot, initial encounter  Alcoholic intoxication without complication [facility])     ED Discharge Orders        Ordered    sulfamethoxazole-trimethoprim (BACTRIM DS,SEPTRA DS) 800-160 MG tablet  2 times daily     08/18/17 0622  Note:  This document was prepared using Dragon voice recognition software and may include unintentional dictation errors.    Paulette Blanch, MD 08/18/17 [phone]

## 2017-08-18 NOTE — ED Notes (Signed)
Pt here via EMS c/o injury to left top of foot, pt report mirror fell and cut the foot causing a loss of tissue approx 1.5 x 3 cm, wound on left foot cleaned, circulation and sensation intact to both distal extremities  Lac on right medial ankle approx .2cm in length  Bleeding controled no evidence of glass in wounds, CMS intact

## 2017-08-18 NOTE — ED Provider Notes (Signed)
[facility] Emergency Department Provider Note   ____________________________________________    I have reviewed the triage vital signs and the nursing notes.   HISTORY  Chief Complaint Laceration     HPI Carol Crawford is a 42 y.o. female who presents for reevaluation of laceration sustained last night.  Patient was apparently intoxicated last night had a piece of glass fall onto her left foot which lacerated the top of her foot.  Seen and evaluated by ED physician last night, wound was not sutured because of loss of tissue per medical record.  Patient complains of continued pain  Past Medical History:  Diagnosis Date  . ADHD   . Bipolar 1 disorder ([place])   . Depressed   . Schizophrenia [facility])     Patient Active Problem List   Diagnosis Date Noted  . Depressed   . Bipolar 1 disorder ([place])     History reviewed. No pertinent surgical history.  Prior to Admission medications   Medication Sig Start Date End Date Taking? Authorizing Provider  sulfamethoxazole-trimethoprim (BACTRIM DS,SEPTRA DS) 800-160 MG tablet Take 1 tablet by mouth 2 (two) times daily. 08/18/17   Paulette Blanch, MD  traMADol (ULTRAM) 50 MG tablet Take 1 tablet (50 mg total) by mouth every 6 (six) hours as needed. 08/18/17   Lavonia Drafts, MD     Allergies Penicillins; Shrimp [shellfish allergy]; and Tomato  History reviewed. No pertinent family history.  Social History Social History   Tobacco Use  . Smoking status: Current Every Day Smoker    Packs/day: 1.00    Types: Cigarettes  . Smokeless tobacco: Never Used  Substance Use Topics  . Alcohol use: Yes    Comment: occasionally  . Drug use: Yes    Types: Marijuana    Comment: last used 2 days ago    Review of Systems  Constitutional: No fever/chills      Musculoskeletal: Left foot pain Skin: Laceration     ____________________________________________   PHYSICAL EXAM:  VITAL SIGNS: ED Triage  Vitals  Enc Vitals Group     BP 08/18/17 1950 130/87     Pulse Rate 08/18/17 1950 77     Resp 08/18/17 1950 18     Temp 08/18/17 1950 98.6 F (37 C)     Temp Source 08/18/17 1950 Oral     SpO2 08/18/17 1950 100 %     Weight 08/18/17 1950 71.2 kg (157 lb)     Height 08/18/17 1950 1.702 m (5\' 7" )     Head Circumference --      Peak Flow --      Pain Score 08/18/17 1959 8     Pain Loc --      Pain Edu? --      Excl. in [place]? --      Constitutional: Alert and oriented. No acute distress.     Respiratory: Normal respiratory effort.  No retractions. Genitourinary: deferred Musculoskeletal: Normal toe movements, no evidence of tendon around Neurologic:  Normal speech and language. No gross focal neurologic deficits are appreciated.   Skin:  Skin is warm, dry.  3 cm horizontal laceration, gaping, no evidence of infection.   ____________________________________________   LABS (all labs ordered are listed, but only abnormal results are displayed)  Labs Reviewed - No data to display ____________________________________________  EKG   ____________________________________________  RADIOLOGY  None ____________________________________________   PROCEDURES  Procedure(s) performed: No  Procedures   Critical Care performed: No ____________________________________________  INITIAL IMPRESSION / ASSESSMENT AND PLAN / ED COURSE  Pertinent labs & imaging results that were available during my care of the patient were reviewed by me and considered in my medical decision making (see chart for details).  Explained to the patient that the wound is gaping and will have to heal by secondary intention.  Redressed the wound, counseled on good wound care, outpatient follow-up.   ____________________________________________   FINAL CLINICAL IMPRESSION(S) / ED DIAGNOSES  Final diagnoses:  Laceration of left foot, subsequent encounter      NEW MEDICATIONS STARTED DURING THIS  VISIT:  New Prescriptions   TRAMADOL (ULTRAM) 50 MG TABLET    Take 1 tablet (50 mg total) by mouth every 6 (six) hours as needed.     Note:  This document was prepared using Dragon voice recognition software and may include unintentional dictation errors.    Lavonia Drafts, MD 08/18/17 2028

## 2017-08-18 NOTE — ED Triage Notes (Signed)
Pt arrived via EMS from a friend's house; admits to drinking alcohol tonight; was in the bathroom and knocked a mirror of the wall; when the mirror fell, it broke in two and one side landed across the top of her left foot; laceration per EMS that is currently dressed; no bleeding through [place] noted;

## 2017-08-18 NOTE — ED Notes (Signed)
Pt wound on left foot cleaned and Xeroform applied; instructions given, teach back method used with pt and family  Lac on right medial ankle cleaned and steri strip applied

## 2017-08-18 NOTE — Discharge Instructions (Signed)
1.  Take antibiotic as prescribed (Septra DS twice daily for 7 days). 2.  Keep wound clean and dry.  You may apply a thin layer of Neosporin twice daily. 3.  Return to the ER for worsening symptoms, increased redness/swelling, purulent discharge or other concerns.

## 2017-08-18 NOTE — ED Triage Notes (Signed)
Pt presents to ED with laceration to top of her left foot after a broken mirror fell on her foot last night. Pt states she was seen in this ED last night for the same and states her wound was not sutured and she was not given any pain medication. Pt states the laceration has been bleeding intermittently today and is very painful. Dressing applied from home.

## 2017-08-18 NOTE — ED Notes (Signed)
At time of discharge pt stating that she will be calling her aunt who is a Chief Executive Officer because she is unhappy with the care she has received.

## 2017-08-18 NOTE — ED Notes (Signed)

## 2017-08-18 NOTE — ED Notes (Signed)
20G PIV removed from RAC, catheter tip intact.

## 2017-08-21 ENCOUNTER — Other Ambulatory Visit: Payer: Self-pay | Admitting: Podiatry

## 2017-08-21 DIAGNOSIS — S96122A Laceration of muscle and tendon of long extensor muscle of toe at ankle and foot level, left foot, initial encounter: Secondary | ICD-10-CM

## 2017-08-21 DIAGNOSIS — T148XXA Other injury of unspecified body region, initial encounter: Secondary | ICD-10-CM

## 2017-08-22 ENCOUNTER — Ambulatory Visit
Admission: RE | Admit: 2017-08-22 | Discharge: 2017-08-22 | Disposition: A | Discharge status: Home / Self Care | Payer: Medicaid Other | Source: Ambulatory Visit | Attending: Podiatry | Admitting: Podiatry

## 2017-08-22 DIAGNOSIS — T148XXA Other injury of unspecified body region, initial encounter: Secondary | ICD-10-CM

## 2017-08-22 DIAGNOSIS — S96122A Laceration of muscle and tendon of long extensor muscle of toe at ankle and foot level, left foot, initial encounter: Secondary | ICD-10-CM | POA: Insufficient documentation

## 2017-08-25 ENCOUNTER — Encounter
Admission: RE | Admit: 2017-08-25 | Discharge: 2017-08-25 | Disposition: A | Discharge status: Home / Self Care | Payer: Medicaid Other | Source: Ambulatory Visit | Attending: Podiatry | Admitting: Podiatry

## 2017-08-25 ENCOUNTER — Other Ambulatory Visit: Payer: Self-pay

## 2017-08-25 HISTORY — DX: Headache, unspecified: R51.9

## 2017-08-25 HISTORY — DX: Gastro-esophageal reflux disease without esophagitis: K21.9

## 2017-08-25 HISTORY — DX: Headache: R51

## 2017-08-25 NOTE — Patient Instructions (Signed)
Your procedure is scheduled on: 08-28-17  Report to Same Day Surgery 2nd floor medical mall [facility] Entrance-take elevator on left to 2nd floor.  Check in with surgery information desk.) To find out your arrival time please call [phone] between 1PM - 3PM on 08-27-17  Remember: Instructions that are not followed completely may result in serious medical risk, up to and including death, or upon the discretion of your surgeon and anesthesiologist your surgery may need to be rescheduled.    _x___ 1. Do not eat food after midnight the night before your procedure. NO GUM OR CANDY AFTER MIDNIGHT.  You may drink clear liquids up to 2 hours before you are scheduled to arrive at the hospital for your procedure.  Do not drink clear liquids within 2 hours of your scheduled arrival to the hospital.  Clear liquids include  --Water or Apple juice without pulp  --Clear carbohydrate beverage such as ClearFast or Gatorade  --Black Coffee or Clear Tea (No milk, no creamers, do not add anything to the coffee or Tea     __x__ 2. No Alcohol for 24 hours before or after surgery.   __x__3. No Smoking or e-cigarettes for 24 prior to surgery.  Do not use any chewable tobacco products for at least 6 hour prior to surgery   ____  4. Bring all medications with you on the day of surgery if instructed.    __x__ 5. Notify your doctor if there is any change in your medical condition     (cold, fever, infections).    x___6. On the morning of surgery brush your teeth with toothpaste and water.  You may rinse your mouth with mouth wash if you wish.  Do not swallow any toothpaste or mouthwash.   Do not wear jewelry, make-up, hairpins, clips or nail polish.  Do not wear lotions, powders, or perfumes. You may wear deodorant.  Do not shave 48 hours prior to surgery. Men may shave face and neck.  Do not bring valuables to the hospital.    [facility] is not responsible for any belongings or valuables.    Contacts, dentures or bridgework may not be worn into surgery.  Leave your suitcase in the car. After surgery it may be brought to your room.  For patients admitted to the hospital, discharge time is determined by your treatment team.  _  Patients discharged the day of surgery will not be allowed to drive home.  You will need someone to drive you home and stay with you the night of your procedure.     ____ Take anti-hypertensive listed below, cardiac, seizure, asthma,     anti-reflux and psychiatric medicines. These include:  1. NONE  2.  3.  4.  5.  6.  ____Fleets enema or Magnesium Citrate as directed.   ____ Use CHG Soap or sage wipes as directed on instruction sheet   ____ Use inhalers on the day of surgery and bring to hospital day of surgery  ____ Stop Metformin and Janumet 2 days prior to surgery.    ____ Take 1/2 of usual insulin dose the night before surgery and none on the morning surgery.   ____ Follow recommendations from Cardiologist, Pulmonologist or PCP regarding  stopping Aspirin, Coumadin, Plavix ,Eliquis, Effient, or Pradaxa, and Pletal.  X____Stop Anti-inflammatories such as Advil, Aleve, Ibuprofen, Motrin, Naproxen, Naprosyn, Goodies powders or aspirin products NOW-OK to take Tylenol.   ____ Stop supplements until after surgery.     ____  Bring C-Pap to the hospital.

## 2017-08-26 NOTE — Pre-Procedure Instructions (Signed)
CALLED DR Karna Christmas OFFICE AND SPOKE WITH LAURIE REGARDING PT STATING THAT SHE DOES NOT HAVE ANYONE TO [place] ON Thursday AND SHE DOESN'T HAVE ANYONE TO STAY THE NIGHT OF HER PROCEDURE WITH HER.  LAURIE SAID SHE WILL LET DR Vickki Muff KNOW AND SHE WILL TRY AND CALL PT AND TELL HER SHE HAS TO HAVE SOMEONE TO DRIVE HER HOME THE DAY OF SURGERY

## 2017-08-27 MED ORDER — CLINDAMYCIN PHOSPHATE 900 MG/50ML IV SOLN: 900.0000 mg | INTRAVENOUS | Status: DC

## 2017-08-28 ENCOUNTER — Ambulatory Visit: Payer: Self-pay | Admitting: Anesthesiology

## 2017-08-28 ENCOUNTER — Other Ambulatory Visit: Payer: Self-pay

## 2017-08-28 ENCOUNTER — Encounter
Admission: RE | Disposition: A | Discharge status: Home / Self Care | Payer: Self-pay | Source: Ambulatory Visit | Attending: Podiatry

## 2017-08-28 ENCOUNTER — Ambulatory Visit
Admission: RE | Admit: 2017-08-28 | Discharge: 2017-08-28 | Disposition: A | Discharge status: Home / Self Care | Payer: Self-pay | Source: Ambulatory Visit | Attending: Podiatry | Admitting: Podiatry

## 2017-08-28 ENCOUNTER — Encounter: Payer: Self-pay | Admitting: *Deleted

## 2017-08-28 DIAGNOSIS — F909 Attention-deficit hyperactivity disorder, unspecified type: Secondary | ICD-10-CM | POA: Insufficient documentation

## 2017-08-28 DIAGNOSIS — S91312D Laceration without foreign body, left foot, subsequent encounter: Secondary | ICD-10-CM | POA: Insufficient documentation

## 2017-08-28 DIAGNOSIS — Z539 Procedure and treatment not carried out, unspecified reason: Secondary | ICD-10-CM | POA: Insufficient documentation

## 2017-08-28 DIAGNOSIS — W25XXXD Contact with sharp glass, subsequent encounter: Secondary | ICD-10-CM | POA: Insufficient documentation

## 2017-08-28 DIAGNOSIS — F209 Schizophrenia, unspecified: Secondary | ICD-10-CM | POA: Insufficient documentation

## 2017-08-28 DIAGNOSIS — F319 Bipolar disorder, unspecified: Secondary | ICD-10-CM | POA: Insufficient documentation

## 2017-08-28 LAB — URINE DRUG SCREEN, QUALITATIVE (ARMC ONLY)
Amphetamines, Ur Screen: NOT DETECTED
BARBITURATES, UR SCREEN: NOT DETECTED
BENZODIAZEPINE, UR SCRN: NOT DETECTED
COCAINE METABOLITE, UR ~~LOC~~: POSITIVE — AB
Cannabinoid 50 Ng, Ur ~~LOC~~: POSITIVE — AB
MDMA (Ecstasy)Ur Screen: NOT DETECTED
METHADONE SCREEN, URINE: NOT DETECTED
Opiate, Ur Screen: NOT DETECTED
Phencyclidine (PCP) Ur S: NOT DETECTED
TRICYCLIC, UR SCREEN: NOT DETECTED

## 2017-08-28 LAB — POCT PREGNANCY, URINE: Preg Test, Ur: NEGATIVE

## 2017-08-28 SURGERY — REPAIR, TENDON, EXTENSOR
Anesthesia: General | Laterality: Left

## 2017-08-28 MED ORDER — BUPIVACAINE HCL (PF) 0.5 % IJ SOLN
INTRAMUSCULAR | Status: AC
  Filled 2017-08-28: qty 30

## 2017-08-28 MED ORDER — FAMOTIDINE 20 MG PO TABS
20.0000 mg | ORAL_TABLET | Freq: Once | ORAL | Status: AC
  Administered 2017-08-28: 20 mg via ORAL

## 2017-08-28 MED ORDER — BUPIVACAINE HCL (PF) 0.25 % IJ SOLN
INTRAMUSCULAR | Status: AC
  Filled 2017-08-28: qty 30

## 2017-08-28 MED ORDER — LIDOCAINE HCL (PF) 1 % IJ SOLN
INTRAMUSCULAR | Status: AC
  Filled 2017-08-28: qty 30

## 2017-08-28 MED ORDER — POVIDONE-IODINE 7.5 % EX SOLN
Freq: Once | CUTANEOUS | Status: DC
  Filled 2017-08-28: qty 118

## 2017-08-28 MED ORDER — LACTATED RINGERS IV SOLN: INTRAVENOUS | Status: DC

## 2017-08-28 MED ORDER — FAMOTIDINE 20 MG PO TABS
ORAL_TABLET | ORAL | Status: AC
  Administered 2017-08-28: 20 mg via ORAL
  Filled 2017-08-28: qty 1

## 2017-08-28 MED ORDER — CLINDAMYCIN PHOSPHATE 900 MG/50ML IV SOLN
INTRAVENOUS | Status: AC
  Filled 2017-08-28: qty 50

## 2017-08-28 MED ORDER — NEOMYCIN-POLYMYXIN B GU 40-200000 IR SOLN
Status: AC
  Filled 2017-08-28: qty 2

## 2017-08-28 SURGICAL SUPPLY — 52 items
BANDAGE ELASTIC 4 LF NS (GAUZE/BANDAGES/DRESSINGS) ×4 IMPLANT
BLADE SURG 15 STRL LF DISP TIS (BLADE) ×2 IMPLANT
BLADE SURG 15 STRL SS (BLADE) ×2
BLADE SURG MINI STRL (BLADE) ×2 IMPLANT
BNDG CONFORM 3 STRL LF (GAUZE/BANDAGES/DRESSINGS) ×2 IMPLANT
BNDG ESMARK 4X12 TAN STRL LF (GAUZE/BANDAGES/DRESSINGS) ×2 IMPLANT
CANISTER SUCT 1200ML W/VALVE (MISCELLANEOUS) ×2 IMPLANT
DRAPE FLUOR MINI C-ARM 54X84 (DRAPES) ×2 IMPLANT
DURAPREP 26ML APPLICATOR (WOUND CARE) ×2 IMPLANT
ELECT REM PT RETURN 9FT ADLT (ELECTROSURGICAL) ×2
ELECTRODE REM PT RTRN 9FT ADLT (ELECTROSURGICAL) ×1 IMPLANT
GAUZE PETRO XEROFOAM 1X8 (MISCELLANEOUS) ×2 IMPLANT
GAUZE SPONGE 4X4 12PLY STRL (GAUZE/BANDAGES/DRESSINGS) ×2 IMPLANT
GAUZE STRETCH 2X75IN STRL (MISCELLANEOUS) ×2 IMPLANT
GLOVE BIO SURGEON STRL SZ7.5 (GLOVE) ×2 IMPLANT
GLOVE INDICATOR 8.0 STRL GRN (GLOVE) ×2 IMPLANT
GOWN STRL REUS W/ TWL LRG LVL3 (GOWN DISPOSABLE) ×2 IMPLANT
GOWN STRL REUS W/TWL LRG LVL3 (GOWN DISPOSABLE) ×2
HANDLE YANKAUER SUCT BULB TIP (MISCELLANEOUS) ×2 IMPLANT
KIT SUTURE 1.8 Q-FIX DISP (KITS) ×2 IMPLANT
KIT TURNOVER KIT A (KITS) ×2 IMPLANT
LABEL OR SOLS (LABEL) ×2 IMPLANT
NDL MAYO CATGUT SZ5 (NEEDLE)
NDL SUT 5 .5 CRC TPR PNT MAYO (NEEDLE) IMPLANT
NEEDLE FILTER BLUNT 18X 1/2SAF (NEEDLE) ×1
NEEDLE FILTER BLUNT 18X1 1/2 (NEEDLE) ×1 IMPLANT
NEEDLE HYPO 25X1 1.5 SAFETY (NEEDLE) ×2 IMPLANT
NS IRRIG 500ML POUR BTL (IV SOLUTION) ×2 IMPLANT
PACK EXTREMITY ARMC (MISCELLANEOUS) ×2 IMPLANT
PAD CAST CTTN 4X4 STRL (SOFTGOODS) ×2 IMPLANT
PADDING CAST COTTON 4X4 STRL (SOFTGOODS) ×2
RASP SM TEAR CROSS CUT (RASP) ×2 IMPLANT
SOL PREP PVP 2OZ (MISCELLANEOUS) ×2
SOLUTION PREP PVP 2OZ (MISCELLANEOUS) ×1 IMPLANT
SPLINT CAST 1 STEP 5X30 WHT (MISCELLANEOUS) ×2 IMPLANT
SPLINT FAST PLASTER 5X30 (CAST SUPPLIES) ×1
SPLINT PLASTER CAST FAST 5X30 (CAST SUPPLIES) ×1 IMPLANT
SPONGE LAP 18X18 RF (DISPOSABLE) ×2 IMPLANT
STOCKINETTE M/LG 89821 (MISCELLANEOUS) ×2 IMPLANT
STRIP CLOSURE SKIN 1/2X4 (GAUZE/BANDAGES/DRESSINGS) ×2 IMPLANT
SUT MNCRL+ 5-0 VIOLET P-3 (SUTURE) ×1 IMPLANT
SUT MONOCRYL 5-0 (SUTURE) ×1
SUT VIC AB 0 SH 27 (SUTURE) ×2 IMPLANT
SUT VIC AB 2-0 SH 27 (SUTURE) ×2
SUT VIC AB 2-0 SH 27XBRD (SUTURE) ×2 IMPLANT
SUT VIC AB 3-0 SH 27 (SUTURE) ×1
SUT VIC AB 3-0 SH 27X BRD (SUTURE) ×1 IMPLANT
SUT VIC AB 4-0 FS2 27 (SUTURE) ×2 IMPLANT
SUT VICRYL AB 3-0 FS1 BRD 27IN (SUTURE) ×2 IMPLANT
SWABSTK COMLB BENZOIN TINCTURE (MISCELLANEOUS) ×2 IMPLANT
SYR 10ML LL (SYRINGE) ×4 IMPLANT
SYR 3ML LL SCALE MARK (SYRINGE) ×2 IMPLANT

## 2017-08-28 NOTE — Progress Notes (Signed)
Surgery cancelled.  UDS + cocaine.  Will attempt to reschedule next week.  New dressing applied.  Open wound without signs of infection.

## 2017-08-28 NOTE — OR Nursing (Signed)
UDS positive for cocaine. Dr. Ronelle Nigh notified. Case cancelled per him. Dr. Vickki Muff into see patient and dressed her left foot. He will attempt to reschedule next week.

## 2017-08-28 NOTE — Anesthesia Preprocedure Evaluation (Deleted)
Anesthesia Evaluation  Patient identified by MRN, date of birth, ID band Patient awake    Reviewed: Allergy & Precautions, NPO status , Patient's Chart, lab work & pertinent test results  History of Anesthesia Complications Negative for: history of anesthetic complications  Airway Mallampati: II       Dental   Pulmonary neg sleep apnea, neg COPD, former smoker,           Cardiovascular (-) hypertension(-) Past MI and (-) CHF (-) dysrhythmias (-) Valvular Problems/Murmurs     Neuro/Psych neg Seizures Depression Bipolar Disorder Schizophrenia    GI/Hepatic Neg liver ROS, GERD  Medicated,  Endo/Other  neg diabetes  Renal/GU negative Renal ROS     Musculoskeletal   Abdominal   Peds  Hematology   Anesthesia Other Findings   Reproductive/Obstetrics                            Anesthesia Physical Anesthesia Plan  ASA: III  Anesthesia Plan: General   Post-op Pain Management:    Induction:   PONV Risk Score and Plan: 3 and Ondansetron, Dexamethasone and Midazolam  Airway Management Planned: Oral ETT  Additional Equipment:   Intra-op Plan:   Post-operative Plan:   Informed Consent: I have reviewed the patients History and Physical, chart, labs and discussed the procedure including the risks, benefits and alternatives for the proposed anesthesia with the patient or authorized representative who has indicated his/her understanding and acceptance.     Plan Discussed with:   Anesthesia Plan Comments:         Anesthesia Quick Evaluation

## 2017-09-01 ENCOUNTER — Other Ambulatory Visit: Payer: Self-pay

## 2017-09-01 ENCOUNTER — Ambulatory Visit: Payer: Self-pay | Attending: Oncology

## 2017-09-01 VITALS — BP 129/73 | HR 81 | Temp 98.4°F | Ht 67.0 in | Wt 161.0 lb

## 2017-09-01 DIAGNOSIS — N63 Unspecified lump in unspecified breast: Secondary | ICD-10-CM

## 2017-09-01 DIAGNOSIS — N6452 Nipple discharge: Secondary | ICD-10-CM

## 2017-09-01 NOTE — H&P (Signed)
Surgery cancelled.  +UDS. Reschedule for next week.

## 2017-09-01 NOTE — Progress Notes (Addendum)
  Subjective:     Patient ID: Carol Crawford, female   DOB: 08-22-1975, 42 y.o.   MRN: [id number]  HPI   Review of Systems     Objective:   Physical Exam  Pulmonary/Chest: Right breast exhibits mass, nipple discharge and tenderness. Right breast exhibits no inverted nipple and no skin change. Left breast exhibits no inverted nipple, no mass, no nipple discharge, no skin change and no tenderness. Breasts are symmetrical.         Assessment:     42 year old patient presents for [facility] clinic visit.  Referred from [facility] Department for right breast mass x 3 months.  No prior mammogram. Patient screened, and meets BCCCP eligibility.  Patient does not have insurance, Medicare or Medicaid.  Handout given on Affordable Care Act.  Instructed patient on breast self awareness using teach back method.  Palpated a soft nodular mass at 11:00 right breast.  Patient reports it is smaller in size than when she first noticed.  Remains tender.  Reports she has noticed clear spontaneous right nipple discharge at times.  Patient is scheduled for surgery of a right foot laceration on 09/05/17.      Plan:     Jeanella Anton scheduled patient for diagnostic mammogram, and ultrasound on 09/09/17.  Will follow per BCCCP protocol.

## 2017-09-04 MED ORDER — CLINDAMYCIN PHOSPHATE 900 MG/50ML IV SOLN
900.0000 mg | Freq: Once | INTRAVENOUS | Status: AC
  Administered 2017-09-05: 900 mg via INTRAVENOUS

## 2017-09-05 ENCOUNTER — Ambulatory Visit: Payer: Self-pay | Admitting: Anesthesiology

## 2017-09-05 ENCOUNTER — Other Ambulatory Visit: Payer: Self-pay

## 2017-09-05 ENCOUNTER — Encounter
Admission: RE | Disposition: A | Discharge status: Home / Self Care | Payer: Self-pay | Source: Ambulatory Visit | Attending: Podiatry

## 2017-09-05 ENCOUNTER — Ambulatory Visit
Admission: RE | Admit: 2017-09-05 | Discharge: 2017-09-05 | Disposition: A | Discharge status: Home / Self Care | Payer: Self-pay | Source: Ambulatory Visit | Attending: Podiatry | Admitting: Podiatry

## 2017-09-05 DIAGNOSIS — Z880 Allergy status to penicillin: Secondary | ICD-10-CM | POA: Insufficient documentation

## 2017-09-05 DIAGNOSIS — Z91013 Allergy to seafood: Secondary | ICD-10-CM | POA: Insufficient documentation

## 2017-09-05 DIAGNOSIS — X58XXXA Exposure to other specified factors, initial encounter: Secondary | ICD-10-CM | POA: Insufficient documentation

## 2017-09-05 DIAGNOSIS — F209 Schizophrenia, unspecified: Secondary | ICD-10-CM | POA: Insufficient documentation

## 2017-09-05 DIAGNOSIS — F319 Bipolar disorder, unspecified: Secondary | ICD-10-CM | POA: Insufficient documentation

## 2017-09-05 DIAGNOSIS — K219 Gastro-esophageal reflux disease without esophagitis: Secondary | ICD-10-CM | POA: Insufficient documentation

## 2017-09-05 DIAGNOSIS — F909 Attention-deficit hyperactivity disorder, unspecified type: Secondary | ICD-10-CM | POA: Insufficient documentation

## 2017-09-05 DIAGNOSIS — Z87891 Personal history of nicotine dependence: Secondary | ICD-10-CM | POA: Insufficient documentation

## 2017-09-05 DIAGNOSIS — Z79899 Other long term (current) drug therapy: Secondary | ICD-10-CM | POA: Insufficient documentation

## 2017-09-05 DIAGNOSIS — M65872 Other synovitis and tenosynovitis, left ankle and foot: Secondary | ICD-10-CM | POA: Insufficient documentation

## 2017-09-05 DIAGNOSIS — S96822A Laceration of other specified muscles and tendons at ankle and foot level, left foot, initial encounter: Secondary | ICD-10-CM | POA: Insufficient documentation

## 2017-09-05 HISTORY — PX: REPAIR EXTENSOR TENDON: SHX5382

## 2017-09-05 LAB — URINE DRUG SCREEN, QUALITATIVE (ARMC ONLY)
Amphetamines, Ur Screen: NOT DETECTED
Benzodiazepine, Ur Scrn: NOT DETECTED
CANNABINOID 50 NG, UR ~~LOC~~: POSITIVE — AB
COCAINE METABOLITE, UR ~~LOC~~: NOT DETECTED
MDMA (ECSTASY) UR SCREEN: NOT DETECTED
Methadone Scn, Ur: NOT DETECTED
Opiate, Ur Screen: NOT DETECTED
PHENCYCLIDINE (PCP) UR S: NOT DETECTED
Tricyclic, Ur Screen: NOT DETECTED

## 2017-09-05 LAB — POCT PREGNANCY, URINE: Preg Test, Ur: NEGATIVE

## 2017-09-05 SURGERY — REPAIR, TENDON, EXTENSOR
Anesthesia: General | Site: Foot | Laterality: Left | Wound class: "Clean "

## 2017-09-05 MED ORDER — LACTATED RINGERS IV SOLN
INTRAVENOUS | Status: DC
  Administered 2017-09-05: 07:00:00 via INTRAVENOUS

## 2017-09-05 MED ORDER — NEOMYCIN-POLYMYXIN B GU 40-200000 IR SOLN
Status: DC | PRN
  Administered 2017-09-05: 2 mL

## 2017-09-05 MED ORDER — ONDANSETRON HCL 4 MG/2ML IJ SOLN: 4.0000 mg | Freq: Once | INTRAMUSCULAR | Status: DC | PRN

## 2017-09-05 MED ORDER — ONDANSETRON HCL 4 MG/2ML IJ SOLN
INTRAMUSCULAR | Status: DC | PRN
  Administered 2017-09-05: 4 mg via INTRAVENOUS

## 2017-09-05 MED ORDER — EPHEDRINE SULFATE 50 MG/ML IJ SOLN
INTRAMUSCULAR | Status: DC | PRN
  Administered 2017-09-05 (×2): 10 mg via INTRAVENOUS

## 2017-09-05 MED ORDER — OXYCODONE-ACETAMINOPHEN 5-325 MG PO TABS: 1.0000 | ORAL_TABLET | Freq: Four times a day (QID) | ORAL | 0 refills | Status: DC | PRN

## 2017-09-05 MED ORDER — MIDAZOLAM HCL 2 MG/2ML IJ SOLN
INTRAMUSCULAR | Status: AC
  Filled 2017-09-05: qty 2

## 2017-09-05 MED ORDER — DEXAMETHASONE SODIUM PHOSPHATE 10 MG/ML IJ SOLN
INTRAMUSCULAR | Status: AC
Start: 2017-09-05 — End: ?
  Filled 2017-09-05: qty 1

## 2017-09-05 MED ORDER — LIDOCAINE HCL (PF) 1 % IJ SOLN
INTRAMUSCULAR | Status: AC
  Filled 2017-09-05: qty 30

## 2017-09-05 MED ORDER — BUPIVACAINE HCL (PF) 0.5 % IJ SOLN
INTRAMUSCULAR | Status: DC | PRN
  Administered 2017-09-05: 19 mL
  Administered 2017-09-05: 5 mL

## 2017-09-05 MED ORDER — MIDAZOLAM HCL 2 MG/2ML IJ SOLN
INTRAMUSCULAR | Status: DC | PRN
  Administered 2017-09-05: 2 mg via INTRAVENOUS

## 2017-09-05 MED ORDER — NEOMYCIN-POLYMYXIN B GU 40-200000 IR SOLN
Status: AC
  Filled 2017-09-05: qty 2

## 2017-09-05 MED ORDER — FENTANYL CITRATE (PF) 100 MCG/2ML IJ SOLN
25.0000 ug | INTRAMUSCULAR | Status: DC | PRN
  Administered 2017-09-05 (×2): 50 ug via INTRAVENOUS

## 2017-09-05 MED ORDER — FAMOTIDINE 20 MG PO TABS
ORAL_TABLET | ORAL | Status: AC
  Administered 2017-09-05: 20 mg via ORAL
  Filled 2017-09-05: qty 1

## 2017-09-05 MED ORDER — FENTANYL CITRATE (PF) 100 MCG/2ML IJ SOLN
INTRAMUSCULAR | Status: AC
  Filled 2017-09-05: qty 2

## 2017-09-05 MED ORDER — LIDOCAINE-EPINEPHRINE 1 %-1:100000 IJ SOLN
INTRAMUSCULAR | Status: AC
  Filled 2017-09-05: qty 1

## 2017-09-05 MED ORDER — FENTANYL CITRATE (PF) 100 MCG/2ML IJ SOLN
INTRAMUSCULAR | Status: DC | PRN
  Administered 2017-09-05 (×2): 50 ug via INTRAVENOUS

## 2017-09-05 MED ORDER — ACETAMINOPHEN NICU IV SYRINGE 10 MG/ML
INTRAVENOUS | Status: AC
  Filled 2017-09-05: qty 1

## 2017-09-05 MED ORDER — PROPOFOL 10 MG/ML IV BOLUS
INTRAVENOUS | Status: AC
  Filled 2017-09-05: qty 20

## 2017-09-05 MED ORDER — LIDOCAINE-EPINEPHRINE 1 %-1:100000 IJ SOLN
INTRAMUSCULAR | Status: DC | PRN
  Administered 2017-09-05: 5 mL

## 2017-09-05 MED ORDER — PROMETHAZINE HCL 25 MG/ML IJ SOLN: 6.2500 mg | INTRAMUSCULAR | Status: DC | PRN

## 2017-09-05 MED ORDER — BUPIVACAINE HCL (PF) 0.5 % IJ SOLN
INTRAMUSCULAR | Status: AC
  Filled 2017-09-05: qty 30

## 2017-09-05 MED ORDER — CLINDAMYCIN PHOSPHATE 900 MG/50ML IV SOLN
INTRAVENOUS | Status: AC
  Filled 2017-09-05: qty 50

## 2017-09-05 MED ORDER — DEXAMETHASONE SODIUM PHOSPHATE 10 MG/ML IJ SOLN
INTRAMUSCULAR | Status: DC | PRN
  Administered 2017-09-05: 10 mg via INTRAVENOUS

## 2017-09-05 MED ORDER — SEVOFLURANE IN SOLN
RESPIRATORY_TRACT | Status: AC
  Filled 2017-09-05: qty 250

## 2017-09-05 MED ORDER — FENTANYL CITRATE (PF) 100 MCG/2ML IJ SOLN
INTRAMUSCULAR | Status: AC
  Administered 2017-09-05: 50 ug via INTRAVENOUS
  Filled 2017-09-05: qty 2

## 2017-09-05 MED ORDER — PROPOFOL 10 MG/ML IV BOLUS
INTRAVENOUS | Status: DC | PRN
  Administered 2017-09-05: 200 mg via INTRAVENOUS

## 2017-09-05 MED ORDER — ONDANSETRON HCL 4 MG PO TABS: 4.0000 mg | ORAL_TABLET | Freq: Four times a day (QID) | ORAL | Status: DC | PRN

## 2017-09-05 MED ORDER — ONDANSETRON HCL 4 MG/2ML IJ SOLN: 4.0000 mg | Freq: Four times a day (QID) | INTRAMUSCULAR | Status: DC | PRN

## 2017-09-05 MED ORDER — ONDANSETRON HCL 4 MG/2ML IJ SOLN
INTRAMUSCULAR | Status: AC
  Filled 2017-09-05: qty 2

## 2017-09-05 MED ORDER — FAMOTIDINE 20 MG PO TABS
20.0000 mg | ORAL_TABLET | Freq: Once | ORAL | Status: AC
  Administered 2017-09-05: 20 mg via ORAL

## 2017-09-05 MED ORDER — ACETAMINOPHEN 10 MG/ML IV SOLN
INTRAVENOUS | Status: DC | PRN
  Administered 2017-09-05: 1000 mg via INTRAVENOUS

## 2017-09-05 MED ORDER — FENTANYL CITRATE (PF) 100 MCG/2ML IJ SOLN: 25.0000 ug | INTRAMUSCULAR | Status: DC | PRN

## 2017-09-05 SURGICAL SUPPLY — 58 items
BANDAGE ELASTIC 4 LF NS (GAUZE/BANDAGES/DRESSINGS) ×4 IMPLANT
BLADE SURG 15 STRL LF DISP TIS (BLADE) ×2 IMPLANT
BLADE SURG 15 STRL SS (BLADE) ×2
BLADE SURG MINI STRL (BLADE) ×2 IMPLANT
BNDG CONFORM 3 STRL LF (GAUZE/BANDAGES/DRESSINGS) ×3 IMPLANT
BNDG ESMARK 4X12 TAN STRL LF (GAUZE/BANDAGES/DRESSINGS) ×2 IMPLANT
BNDG GAUZE 4.5X4.1 6PLY STRL (MISCELLANEOUS) ×1 IMPLANT
CANISTER SUCT 1200ML W/VALVE (MISCELLANEOUS) ×2 IMPLANT
CUFF TOURN 24 STER (MISCELLANEOUS) ×1 IMPLANT
DRAPE FLUOR MINI C-ARM 54X84 (DRAPES) ×2 IMPLANT
DURAPREP 26ML APPLICATOR (WOUND CARE) ×2 IMPLANT
ELECT REM PT RETURN 9FT ADLT (ELECTROSURGICAL) ×2
ELECTRODE REM PT RTRN 9FT ADLT (ELECTROSURGICAL) ×1 IMPLANT
GAUZE PETRO XEROFOAM 1X8 (MISCELLANEOUS) ×2 IMPLANT
GAUZE SPONGE 4X4 12PLY STRL (GAUZE/BANDAGES/DRESSINGS) ×2 IMPLANT
GAUZE STRETCH 2X75IN STRL (MISCELLANEOUS) ×2 IMPLANT
GLOVE BIO SURGEON STRL SZ7.5 (GLOVE) ×2 IMPLANT
GLOVE INDICATOR 8.0 STRL GRN (GLOVE) ×2 IMPLANT
GOWN STRL REUS W/ TWL LRG LVL3 (GOWN DISPOSABLE) ×2 IMPLANT
GOWN STRL REUS W/TWL LRG LVL3 (GOWN DISPOSABLE) ×2
HANDLE YANKAUER SUCT BULB TIP (MISCELLANEOUS) ×2 IMPLANT
KIT SUTURE 1.8 Q-FIX DISP (KITS) ×2 IMPLANT
KIT TURNOVER KIT A (KITS) ×2 IMPLANT
LABEL OR SOLS (LABEL) ×2 IMPLANT
NDL FILTER BLUNT 18X1 1/2 (NEEDLE) ×1 IMPLANT
NDL HYPO 25X1 1.5 SAFETY (NEEDLE) ×1 IMPLANT
NDL MAYO CATGUT SZ5 (NEEDLE)
NDL SUT 5 .5 CRC TPR PNT MAYO (NEEDLE) IMPLANT
NEEDLE FILTER BLUNT 18X 1/2SAF (NEEDLE) ×1
NEEDLE FILTER BLUNT 18X1 1/2 (NEEDLE) ×1 IMPLANT
NEEDLE HYPO 25X1 1.5 SAFETY (NEEDLE) ×2 IMPLANT
NS IRRIG 500ML POUR BTL (IV SOLUTION) ×2 IMPLANT
PACK EXTREMITY ARMC (MISCELLANEOUS) ×2 IMPLANT
PAD CAST CTTN 4X4 STRL (SOFTGOODS) ×2 IMPLANT
PADDING CAST COTTON 4X4 STRL (SOFTGOODS) ×2
RASP SM TEAR CROSS CUT (RASP) ×2 IMPLANT
SOL PREP PVP 2OZ (MISCELLANEOUS) ×2
SOLUTION PREP PVP 2OZ (MISCELLANEOUS) ×1 IMPLANT
SPLINT CAST 1 STEP 5X30 WHT (MISCELLANEOUS) ×2 IMPLANT
SPLINT FAST PLASTER 5X30 (CAST SUPPLIES) ×1
SPLINT PLASTER CAST FAST 5X30 (CAST SUPPLIES) ×1 IMPLANT
SPONGE LAP 18X18 RF (DISPOSABLE) ×1 IMPLANT
STOCKINETTE M/LG 89821 (MISCELLANEOUS) ×2 IMPLANT
STRIP CLOSURE SKIN 1/2X4 (GAUZE/BANDAGES/DRESSINGS) ×2 IMPLANT
SUT MERSILENE 2.0 SH NDLE (SUTURE) ×3 IMPLANT
SUT MERSILENE 4 0 P 3 (SUTURE) ×3 IMPLANT
SUT MNCRL+ 5-0 VIOLET P-3 (SUTURE) ×1 IMPLANT
SUT MONOCRYL 5-0 (SUTURE) ×1
SUT VIC AB 0 SH 27 (SUTURE) ×2 IMPLANT
SUT VIC AB 2-0 SH 27 (SUTURE) ×2
SUT VIC AB 2-0 SH 27XBRD (SUTURE) ×2 IMPLANT
SUT VIC AB 3-0 SH 27 (SUTURE) ×4
SUT VIC AB 3-0 SH 27X BRD (SUTURE) ×1 IMPLANT
SUT VIC AB 4-0 FS2 27 (SUTURE) ×2 IMPLANT
SUT VICRYL AB 3-0 FS1 BRD 27IN (SUTURE) ×2 IMPLANT
SWABSTK COMLB BENZOIN TINCTURE (MISCELLANEOUS) ×2 IMPLANT
SYR 10ML LL (SYRINGE) ×4 IMPLANT
SYR 3ML LL SCALE MARK (SYRINGE) ×2 IMPLANT

## 2017-09-05 NOTE — OR Nursing (Signed)
Discussed discharge instructions with pt and family. All voice understanding.

## 2017-09-05 NOTE — Anesthesia Postprocedure Evaluation (Signed)
Anesthesia Post Note  Patient: Carol Crawford  Procedure(s) Performed: REPAIR EXTENSOR TENDON (Left Foot)  Patient location during evaluation: PACU Anesthesia Type: General Level of consciousness: awake and alert Pain management: pain level controlled Vital Signs Assessment: post-procedure vital signs reviewed and stable Respiratory status: spontaneous breathing, nonlabored ventilation, respiratory function stable and patient connected to nasal cannula oxygen Cardiovascular status: blood pressure returned to baseline and stable Postop Assessment: no apparent nausea or vomiting Anesthetic complications: no     Last Vitals:  Vitals:   09/05/17 1107 09/05/17 1127  BP: 121/78 130/76  Pulse: (!) 56   Resp:    Temp: (!) 35.9 C   SpO2: 100% 100%    Last Pain:  Vitals:   09/05/17 1107  TempSrc:   PainSc: 0-No pain                 Martha Clan

## 2017-09-05 NOTE — Anesthesia Preprocedure Evaluation (Signed)
Anesthesia Evaluation  Patient identified by MRN, date of birth, ID band Patient awake    Reviewed: Allergy & Precautions, NPO status , Patient's Chart, lab work & pertinent test results  History of Anesthesia Complications Negative for: history of anesthetic complications  Airway Mallampati: II       Dental  (+) Dental Advidsory Given   Pulmonary neg sleep apnea, neg COPD, former smoker,           Cardiovascular (-) hypertension(-) Past MI and (-) CHF (-) dysrhythmias (-) Valvular Problems/Murmurs     Neuro/Psych neg Seizures PSYCHIATRIC DISORDERS Depression Bipolar Disorder Schizophrenia    GI/Hepatic Neg liver ROS, GERD  Medicated,  Endo/Other  neg diabetes  Renal/GU negative Renal ROS     Musculoskeletal   Abdominal   Peds  Hematology   Anesthesia Other Findings Past Medical History: No date: ADHD No date: Bipolar 1 disorder (HCC) No date: Depressed No date: GERD (gastroesophageal reflux disease)     Comment:  OCC No date: Headache     Comment:  MIGRAINES No date: Schizophrenia ([place])   Reproductive/Obstetrics                             Anesthesia Physical  Anesthesia Plan  ASA: III  Anesthesia Plan: General   Post-op Pain Management:    Induction: Intravenous  PONV Risk Score and Plan: 3 and Ondansetron, Dexamethasone and Midazolam  Airway Management Planned: LMA  Additional Equipment:   Intra-op Plan:   Post-operative Plan: Extubation in OR  Informed Consent: I have reviewed the patients History and Physical, chart, labs and discussed the procedure including the risks, benefits and alternatives for the proposed anesthesia with the patient or authorized representative who has indicated his/her understanding and acceptance.     Plan Discussed with:   Anesthesia Plan Comments:         Anesthesia Quick Evaluation

## 2017-09-05 NOTE — Anesthesia Post-op Follow-up Note (Signed)
Anesthesia QCDR form completed.        

## 2017-09-05 NOTE — H&P (Signed)
HISTORY AND PHYSICAL INTERVAL NOTE:  09/05/2017  7:24 AM  Carol Crawford  has presented today for surgery, with the diagnosis of Laceration of left extensor hallucis longus tendon.  The various methods of treatment have been discussed with the patient.  No guarantees were given.  After consideration of risks, benefits and other options for treatment, the patient has consented to surgery.  I have reviewed the patients' chart and labs.    Patient Vitals for the past 24 hrs:  BP Temp Temp src Pulse SpO2 Height Weight  09/05/17 0640 113/76 97.8 F (36.6 C) Temporal 69 100 % 5\' 7"  (1.702 m) 73 kg (161 lb)    A history and physical examination was performed in my office.  The patient was reexamined.  There have been no changes to this history and physical examination.  Carol Crawford A

## 2017-09-05 NOTE — Op Note (Signed)
Operative note   Surgeon:Dmarius Reeder Lawyer: none    Preop diagnosis: 1.  Torn anterior tibial tendon tendon left foot 2.  Torn extensor hallucis longus tendon left foot    Postop diagnosis: Same    Procedure: Open repair of torn anterior tibial tendon tendon left foot and open repair of extensor hallucis longus tendon left foot    EBL: Minimal    Anesthesia:local and general.  Local anesthetic consisted of a total of 29 cc of 0.5% bupivacaine and 5 cc of lidocaine with epinephrine infiltrated along the incision site    Hemostasis: Epinephrine 1-200,000 infiltrated along the incision site    Specimen: Torn anterior tibial tendon    Complications: None    Operative indications:Carol Crawford is an 42 y.o. that presents today for surgical intervention.  The risks/benefits/alternatives/complications have been discussed and consent has been given.    Procedure:  Patient was brought into the OR and placed on the operating table in thesupine position. After anesthesia was obtained theleft lower extremity was prepped and draped in usual sterile fashion.  Attention was directed to the anterior aspect of the ankle where the proximal distal aspect of the tendons were palpated.  A longitudinal incision was made overlying the ankle joint to the level of the midfoot.  Sharp and blunt dissection carried down to the deep fascial layers.  At this time the tendon sheaths of the extensor hallucis longus and anterior tibial tendon were entered.  The ends of the torn tendons were then noted.  The anterior tibial tendon had a small piece still attached along the medial cuneiform.  There was a rupture with retraction to the level of the ankle joint.  Extensor hallucis longus tendon was retracted to the level of the ankle joint and the midshaft of the first metatarsal.  Wounds were flushed with copious amounts of irrigation.  The foot was held in a neutral position.  I was able to distract the extensor  hallucis longus tendon and with a Krakw type suture with a 4-0 Mersilene I was able to reattach the suture ends with good stability noted.  This was reinforced with a Kessler suture with the 4-0 Mersilene.  The anterior tibial tendon was repaired with a Krakw suture with a 2-0 Mersilene with good stability noted.  At this time all wounds were flushed with copious amounts of irrigation.  The tendon sheaths were reapproximated with 3-0 Vicryl.  The subtenons tissue reapproximated with 3-0 Vicryl and the skin reapproximated with a 3-0 nylon.  The dorsal midfoot laceration had retracted slightly and I was able to primarily close the wound with just slight area left open for drainage.  All areas were infiltrated with 0.5% bupivacaine.  A bulky sterile dressing was applied.  Patient was placed in a posterior splint with the foot in a neutral 90 degree position.    Patient tolerated the procedure and anesthesia well.  Was transported from the OR to the PACU with all vital signs stable and vascular status intact. To be discharged per routine protocol.  Will follow up in approximately 1 week in the outpatient clinic.

## 2017-09-05 NOTE — Anesthesia Procedure Notes (Signed)
Procedure Name: LMA Insertion Date/Time: 09/05/2017 7:55 AM Performed by: Jonna Clark, CRNA Pre-anesthesia Checklist: Patient identified, Patient being monitored, Timeout performed, Emergency Drugs available and Suction available Patient Re-evaluated:Patient Re-evaluated prior to induction Oxygen Delivery Method: Circle system utilized Preoxygenation: Pre-oxygenation with 100% oxygen Induction Type: IV induction Ventilation: Mask ventilation without difficulty LMA: LMA inserted LMA Size: 3.5 Tube type: Oral Number of attempts: 1 Placement Confirmation: positive ETCO2 and breath sounds checked- equal and bilateral Tube secured with: Tape Dental Injury: Teeth and Oropharynx as per pre-operative assessment

## 2017-09-05 NOTE — Discharge Instructions (Signed)
[place] DR. [place]   1. Take your medication as prescribed.  Pain medication should be taken only as needed.  2. Keep the dressing clean, dry and intact.  3. Keep your foot elevated above the heart level for the first 48 hours.  4. We have instructed you to be non-weight bearing.  5. Always wear your post-op shoe when walking.  Always use your crutches if you are to be non-weight bearing.  6. Do not take a shower. Baths are permissible as long as the foot is kept out of the water.   7. Every hour you are awake:  - Bend your knee 15 times.  8. Call [facility] [phone]) if any of the following problems occur: - You develop a temperature or fever. - The bandage becomes saturated with blood. - Medication does not stop your pain. - Injury of the foot occurs. - Any symptoms of infection including redness, odor, or red streaks running from wound.

## 2017-09-05 NOTE — Transfer of Care (Signed)
Immediate Anesthesia Transfer of Care Note  Patient: Carol Crawford  Procedure(s) Performed: REPAIR EXTENSOR TENDON (Left Foot)  Patient Location: PACU  Anesthesia Type:General  Level of Consciousness: awake, alert  and oriented  Airway & Oxygen Therapy: Patient Spontanous Breathing and Patient connected to face mask oxygen  Post-op Assessment: Report given to RN and Post -op Vital signs reviewed and stable  Post vital signs: Reviewed and stable  Last Vitals:  Vitals Value Taken Time  BP 123/65 09/05/2017 10:05 AM  Temp    Pulse 75 09/05/2017 10:05 AM  Resp 18 09/05/2017 10:05 AM  SpO2 100 % 09/05/2017 10:05 AM  Vitals shown include unvalidated device data.  Last Pain:  Vitals:   09/05/17 0640  TempSrc: Temporal  PainSc: 7          Complications: No apparent anesthesia complications

## 2017-09-06 ENCOUNTER — Encounter: Payer: Self-pay | Admitting: Podiatry

## 2017-09-08 LAB — SURGICAL PATHOLOGY

## 2017-09-09 ENCOUNTER — Inpatient Hospital Stay: Admission: RE | Admit: 2017-09-09 | Payer: Medicaid Other | Source: Ambulatory Visit

## 2017-09-09 ENCOUNTER — Other Ambulatory Visit: Payer: Medicaid Other

## 2017-09-10 ENCOUNTER — Emergency Department: Payer: Medicaid Other

## 2017-09-10 ENCOUNTER — Emergency Department
Admission: EM | Admit: 2017-09-10 | Discharge: 2017-09-10 | Disposition: A | Discharge status: Home / Self Care | Payer: Medicaid Other | Attending: Emergency Medicine | Admitting: Emergency Medicine

## 2017-09-10 ENCOUNTER — Other Ambulatory Visit: Payer: Self-pay

## 2017-09-10 DIAGNOSIS — S91312D Laceration without foreign body, left foot, subsequent encounter: Secondary | ICD-10-CM | POA: Insufficient documentation

## 2017-09-10 DIAGNOSIS — Z5189 Encounter for other specified aftercare: Secondary | ICD-10-CM

## 2017-09-10 DIAGNOSIS — W010XXA Fall on same level from slipping, tripping and stumbling without subsequent striking against object, initial encounter: Secondary | ICD-10-CM | POA: Insufficient documentation

## 2017-09-10 DIAGNOSIS — Y92009 Unspecified place in unspecified non-institutional (private) residence as the place of occurrence of the external cause: Secondary | ICD-10-CM | POA: Insufficient documentation

## 2017-09-10 DIAGNOSIS — S99912A Unspecified injury of left ankle, initial encounter: Secondary | ICD-10-CM

## 2017-09-10 DIAGNOSIS — Y9389 Activity, other specified: Secondary | ICD-10-CM | POA: Insufficient documentation

## 2017-09-10 DIAGNOSIS — Z711 Person with feared health complaint in whom no diagnosis is made: Secondary | ICD-10-CM | POA: Insufficient documentation

## 2017-09-10 DIAGNOSIS — Y998 Other external cause status: Secondary | ICD-10-CM | POA: Insufficient documentation

## 2017-09-10 MED ORDER — OXYCODONE-ACETAMINOPHEN 5-325 MG PO TABS
1.0000 | ORAL_TABLET | Freq: Once | ORAL | Status: AC
  Administered 2017-09-10: 1 via ORAL
  Filled 2017-09-10: qty 1

## 2017-09-10 NOTE — ED Provider Notes (Signed)
[facility] Emergency Department Provider Note  ____________________________________________  Time seen: Approximately 8:53 PM  I have reviewed the triage vital signs and the nursing notes.   HISTORY  Chief Complaint Post-op Problem    HPI Carol Crawford is a 42 y.o. female who presents the emergency department complaining of possible reinjury to the left foot.  Patient had a glass mirror fell on her left foot, caused a deep laceration lacerating through the extensor tendons of the left foot.  Patient was followed by podiatry and had surgery 5 days ago on the left foot.  Patient was at home, t attempting to bathe when she fell injuring the left foot.  Patient reports that she had immediate swelling but was able to remove her cast.  She reports that she had moderate bleeding from her surgical site soaking the bandages that were in her cast.  Patient was able to stop the bleeding, wrapped the foot and an Ace bandage and call 911.  Patient reports that she is having pain to the site of the surgery but no other significant pain.  Patient reports significant bruising to the foot.  No active bleeding.  No other injury or complaint.    Past Medical History:  Diagnosis Date  . ADHD   . Bipolar 1 disorder ([place])   . Depressed   . GERD (gastroesophageal reflux disease)    OCC  . Headache    MIGRAINES  . Schizophrenia [facility])     Patient Active Problem List   Diagnosis Date Noted  . Depressed   . Bipolar 1 disorder [facility])     Past Surgical History:  Procedure Laterality Date  . NO PAST SURGERIES    . REPAIR EXTENSOR TENDON Left 09/05/2017   Procedure: REPAIR EXTENSOR TENDON;  Surgeon: [name], DPM;  Location: [facility] ORS;  Service: Podiatry;  Laterality: Left;    Prior to Admission medications   Medication Sig Start Date End Date Taking? Authorizing Provider  Ca Carbonate-Mag Hydroxide (ROLAIDS PO) Take 1 tablet by mouth as needed.    [provider]   ibuprofen (ADVIL,MOTRIN) 200 MG tablet Take 400 mg by mouth every 6 (six) hours as needed for headache or moderate pain.    [provider]  medroxyPROGESTERone Acetate (DEPO-PROVERA IM) Inject into the muscle.    [provider]  oxyCODONE-acetaminophen (PERCOCET) 5-325 MG tablet Take 1-2 tablets by mouth every 6 (six) hours as needed for severe pain. Max 6 pills per day. 09/05/17 09/05/18  [name], DPM  sulfamethoxazole-trimethoprim (BACTRIM DS,SEPTRA DS) 800-160 MG tablet Take 1 tablet by mouth 2 (two) times daily. Patient not taking: Reported on 09/05/2017 08/18/17   Paulette Blanch, MD  traMADol (ULTRAM) 50 MG tablet Take 1 tablet (50 mg total) by mouth every 6 (six) hours as needed. Patient not taking: Reported on 08/25/2017 08/18/17   Cuthriell, Charline Bills, PA-C  VITAMIN E PO Take 1 capsule by mouth daily.    [provider]    Allergies Penicillins; Shrimp [shellfish allergy]; and Tomato  No family history on file.  Social History Social History   Tobacco Use  . Smoking status: Former Smoker    Packs/day: 1.00    Years: 23.00    Pack years: 23.00    Types: Cigarettes    Last attempt to quit: 08/22/2017    Years since quitting: 0.0  . Smokeless tobacco: Never Used  Substance Use Topics  . Alcohol use: Yes    Comment: occasionally  .  Drug use: Yes    Types: Marijuana    Comment: last used 2 days ago     Review of Systems  Constitutional: No fever/chills Eyes: No visual changes.  Cardiovascular: no chest pain. Respiratory: no cough. No SOB. Gastrointestinal: No abdominal pain.  No nausea, no vomiting.   Musculoskeletal: Post surgery to the left foot, new injury with pain, bleeding, edema Skin: Negative for rash, abrasions, lacerations, ecchymosis. Neurological: Negative for headaches, focal weakness or numbness. 10-point ROS otherwise negative.  ____________________________________________   PHYSICAL EXAM:  VITAL SIGNS: ED Triage  Vitals  Enc Vitals Group     BP 09/10/17 2004 (!) 164/94     Pulse Rate 09/10/17 2004 89     Resp 09/10/17 2004 18     Temp 09/10/17 2004 98.2 F (36.8 C)     Temp Source 09/10/17 2004 Oral     SpO2 09/10/17 2004 98 %     Weight 09/10/17 2005 161 lb (73 kg)     Height 09/10/17 2005 5\' 7"  (1.702 m)     Head Circumference --      Peak Flow --      Pain Score 09/10/17 2005 10     Pain Loc --      Pain Edu? --      Excl. in [place]? --      Constitutional: Alert and oriented. Well appearing and in no acute distress. Eyes: Conjunctivae are normal. PERRL. EOMI. Head: Atraumatic. Neck: No stridor.    Cardiovascular: Normal rate, regular rhythm. Normal S1 and S2.  Good peripheral circulation. Respiratory: Normal respiratory effort without tachypnea or retractions. Lungs CTAB. Good air entry to the bases with no decreased or absent breath sounds. Musculoskeletal: Full range of motion to all extremities. No gross deformities appreciated.  Cast removed from left foot with my initial assessment.  Patient had Ace bandage in place.  This is removed as well as dressings.  Laceration/surgical repair with sutures in place with no evidence of ruptured sutures.  No bleeding.  No foreign body.  Patient does have ecchymosis, however it is difficult to determine whether this is new or results of previous injury and surgery.  Patient is able to flex and extend the first, second, third digits.  Patient reports that she has had difficulty extending and flexing the fourth and fifth digits due to her previous injury.  Sensation intact all 5 digits.  Capillary refill less than 2 seconds all digits.  No gross deformity appreciated to the ankle or foot.  Edema is appreciated to the anteromedial aspect of the foot and ankle. Neurologic:  Normal speech and language. No gross focal neurologic deficits are appreciated.  Skin:  Skin is warm, dry and intact. No rash noted. Psychiatric: Mood and affect are normal. Speech and  behavior are normal. Patient exhibits appropriate insight and judgement.   ____________________________________________   LABS (all labs ordered are listed, but only abnormal results are displayed)  Labs Reviewed - No data to display ____________________________________________  EKG   ____________________________________________  RADIOLOGY I personally viewed and evaluated these images as part of my medical decision making, as well as reviewing the written report by the radiologist.  Visualization of imaging reveals no acute fracture or dislocation.  Dg Foot Complete Left  Result Date: 09/10/2017 CLINICAL DATA:  Initial evaluation for acute trauma, fall. Recent surgery. EXAM: LEFT FOOT - COMPLETE 3+ VIEW COMPARISON:  Prior study from 531/19. FINDINGS: As imaged early overlies the left foot. No acute fracture or  dislocation. No radiopaque foreign body. Mild diffuse soft tissue swelling about the foot, may in part be postoperative. IMPRESSION: No acute osseous abnormality about the left foot. Electronically Signed   By: Jeannine Boga M.D.   On: 09/10/2017 20:38    ____________________________________________    PROCEDURES  Procedure(s) performed:    .Splint Application Date/Time: 2/53/6644 9:37 PM Performed by: Darletta Moll, PA-C Authorized by: Darletta Moll, PA-C   Consent:    Consent obtained:  Verbal   Consent given by:  Patient   Risks discussed:  Pain and swelling Pre-procedure details:    Sensation:  Normal Procedure details:    Laterality:  Left   Location:  Ankle   Ankle:  R ankle   Splint type:  Short leg and ankle stirrup   Supplies:  Cotton padding, Ortho-Glass and elastic bandage Post-procedure details:    Pain:  Improved   Sensation:  Normal   Patient tolerance of procedure:  Tolerated well, no immediate complications      Medications  oxyCODONE-acetaminophen (PERCOCET/ROXICET) 5-325 MG per tablet 1 tablet (1 tablet Oral  Given 09/10/17 2057)     ____________________________________________   INITIAL IMPRESSION / ASSESSMENT AND PLAN / ED COURSE  Pertinent labs & imaging results that were available during my care of the patient were reviewed by me and considered in my medical decision making (see chart for details).  Review of the [place] CSRS was performed in accordance of the [place] prior to dispensing any controlled drugs.      Patient's diagnosis is consistent with injury of left ankle, wound recheck after postop, feared complaint without diagnosis.  Patient presents the emergency department concerned that she may have done further injury to her left ankle after a fall.  Patient was postop day 5 from tendon repair performed by Dr. Vickki Muff podiatry.  Patient slipped in her bathroom after showering, injured her ankle.  She had bleeding from her surgical site as well as increased pain and swelling.  She removed her cast prior to arrival.  On exam, wound had not dehisced, sutures still in place.  No gross deformity.  Mild ecchymosis and edema which is unable to be determined whether this was from injury tonight versus surgery.  Patient was able to extend and flex digits appropriately.  I did not perform significant ankle range of motion or resisted range of motion.  Capillary refill less than 2 seconds all digits.  Splint reapplied in place of cast.  Patient already has an appointment tomorrow to see Dr. Vickki Muff, she will follow-up with him in the morning for further evaluation.  X-ray revealed no acute osseous abnormality..  Patient is given ED precautions to return to the ED for any worsening or new symptoms.     ____________________________________________  FINAL CLINICAL IMPRESSION(S) / ED DIAGNOSES  Final diagnoses:  Injury of left ankle, initial encounter  Visit for wound check  Feared complaint without diagnosis      NEW MEDICATIONS STARTED DURING THIS VISIT:  ED Discharge Orders    None           This chart was dictated using voice recognition software/Dragon. Despite best efforts to proofread, errors can occur which can change the meaning. Any change was purely unintentional.    Brynda Peon 09/10/17 2157    Nance Pear, MD 09/10/17 2230

## 2017-09-10 NOTE — ED Notes (Signed)
Pt had left foot surgery by dr Vickki Muff on cut tendons in foot.  Today pt fell in the shower and has increased pain in left foot.  Cast removed by pt.  Incision and sutures intact on left foot.  No bleeding or drainage noted.

## 2017-09-10 NOTE — ED Triage Notes (Signed)
Pt had injury 3 weeks ago where glass lacerated anterior right foot with tendon damage. Pt had tendon repair surgery per Dr. Vickki Muff Friday with cast in place to lower leg. Pt tripped and fell today and states noted increased swelling to foot and ankle. Pt removed cast due to swelling. Pt here to have leg rechecked.

## 2017-09-10 NOTE — ED Notes (Signed)
First RN note:  Patient here via [facility] from home c/o left foot pain.  Patient had surgery recently, fell in her house and is now having increased pain.  No bleeding present and patient was able to walk out on her crutches to the ambulance.  Patient had stable VSS. Patient took 2 percocet at 5:00.

## 2017-09-18 ENCOUNTER — Other Ambulatory Visit: Payer: Medicaid Other

## 2017-12-01 ENCOUNTER — Encounter: Payer: Self-pay | Admitting: Emergency Medicine

## 2017-12-01 ENCOUNTER — Emergency Department: Payer: Medicaid Other

## 2017-12-01 ENCOUNTER — Emergency Department
Admission: EM | Admit: 2017-12-01 | Discharge: 2017-12-01 | Disposition: A | Discharge status: Home / Self Care | Payer: Medicaid Other | Attending: Emergency Medicine | Admitting: Emergency Medicine

## 2017-12-01 DIAGNOSIS — Z87891 Personal history of nicotine dependence: Secondary | ICD-10-CM | POA: Insufficient documentation

## 2017-12-01 DIAGNOSIS — M79672 Pain in left foot: Secondary | ICD-10-CM | POA: Insufficient documentation

## 2017-12-01 DIAGNOSIS — Z79899 Other long term (current) drug therapy: Secondary | ICD-10-CM | POA: Insufficient documentation

## 2017-12-01 NOTE — ED Notes (Signed)
See triage note  Presents with left foot pain  States she felt a pop to foot while walking this am

## 2017-12-01 NOTE — Discharge Instructions (Addendum)
Follow-up with your regular doctor for your already scheduled appointment tomorrow.  Keep foot elevated and ice today.

## 2017-12-01 NOTE — ED Provider Notes (Signed)
[facility] Emergency Department Provider Note  ____________________________________________   First MD Initiated Contact with Patient 12/01/17 1057     (approximate)  I have reviewed the triage vital signs and the nursing notes.   HISTORY  Chief Complaint Foot Pain    HPI Carol Crawford is a 42 y.o. female presents emergency department complaining of left foot pain.  She states she felt a pop in her foot while walking this morning.  States her some swelling on the top of the foot.  She had surgery in June and has a follow-up appointment tomorrow with Dr. Vickki Muff.  She denies any other injuries.  She does states she has numbness and tingling occasionally.    Past Medical History:  Diagnosis Date  . ADHD   . Bipolar 1 disorder ([place])   . Depressed   . GERD (gastroesophageal reflux disease)    OCC  . Headache    MIGRAINES  . Schizophrenia [facility])     Patient Active Problem List   Diagnosis Date Noted  . Depressed   . Bipolar 1 disorder [facility])     Past Surgical History:  Procedure Laterality Date  . NO PAST SURGERIES    . REPAIR EXTENSOR TENDON Left 09/05/2017   Procedure: REPAIR EXTENSOR TENDON;  Surgeon: [name], DPM;  Location: [facility] ORS;  Service: Podiatry;  Laterality: Left;    Prior to Admission medications   Medication Sig Start Date End Date Taking? Authorizing Provider  Ca Carbonate-Mag Hydroxide (ROLAIDS PO) Take 1 tablet by mouth as needed.    [provider]  ibuprofen (ADVIL,MOTRIN) 200 MG tablet Take 400 mg by mouth every 6 (six) hours as needed for headache or moderate pain.    [provider]  medroxyPROGESTERone Acetate (DEPO-PROVERA IM) Inject into the muscle.    [provider]  oxyCODONE-acetaminophen (PERCOCET) 5-325 MG tablet Take 1-2 tablets by mouth every 6 (six) hours as needed for severe pain. Max 6 pills per day. 09/05/17 09/05/18  [name], DPM  sulfamethoxazole-trimethoprim (BACTRIM  DS,SEPTRA DS) 800-160 MG tablet Take 1 tablet by mouth 2 (two) times daily. Patient not taking: Reported on 09/05/2017 08/18/17   Paulette Blanch, MD  traMADol (ULTRAM) 50 MG tablet Take 1 tablet (50 mg total) by mouth every 6 (six) hours as needed. Patient not taking: Reported on 08/25/2017 08/18/17   Cuthriell, Charline Bills, PA-C  VITAMIN E PO Take 1 capsule by mouth daily.    [provider]    Allergies Penicillins; Shrimp [shellfish allergy]; and Tomato  No family history on file.  Social History Social History   Tobacco Use  . Smoking status: Former Smoker    Packs/day: 1.00    Years: 23.00    Pack years: 23.00    Types: Cigarettes    Last attempt to quit: 08/22/2017    Years since quitting: 0.2  . Smokeless tobacco: Never Used  Substance Use Topics  . Alcohol use: Yes    Comment: occasionally  . Drug use: Yes    Types: Marijuana    Comment: last used 2 days ago    Review of Systems  Constitutional: No fever/chills Eyes: No visual changes. ENT: No sore throat. Respiratory: Denies cough Genitourinary: Negative for dysuria. Musculoskeletal: Negative for back pain.  Positive for left foot pain Skin: Negative for rash.    ____________________________________________   PHYSICAL EXAM:  VITAL SIGNS: ED Triage Vitals  Enc Vitals Group     BP 12/01/17 1044 115/87  Pulse Rate 12/01/17 1044 72     Resp 12/01/17 1044 20     Temp 12/01/17 1044 97.7 F (36.5 C)     Temp Source 12/01/17 1044 Oral     SpO2 12/01/17 1044 100 %     Weight 12/01/17 1039 157 lb (71.2 kg)     Height 12/01/17 1039 5\' 7"  (1.702 m)     Head Circumference --      Peak Flow --      Pain Score 12/01/17 1039 8     Pain Loc --      Pain Edu? --      Excl. in [place]? --     Constitutional: Alert and oriented. Well appearing and in no acute distress. Eyes: Conjunctivae are normal.  Head: Atraumatic. Nose: No congestion/rhinnorhea. Mouth/Throat: Mucous membranes are moist.   Neck:  supple  no lymphadenopathy noted Cardiovascular: Normal rate, regular rhythm. Respiratory: Normal respiratory effort.  No retractions GU: deferred Musculoskeletal: FROM all extremities, warm and well perfused.  The left foot is tender across the top of the midfoot.  Surgical scar appears well.  There is no metatarsal tenderness noted. Neurologic:  Normal speech and language.  Skin:  Skin is warm, dry and intact. No rash noted. Psychiatric: Mood and affect are normal. Speech and behavior are normal.  ____________________________________________   LABS (all labs ordered are listed, but only abnormal results are displayed)  Labs Reviewed - No data to display ____________________________________________   ____________________________________________  RADIOLOGY  The left foot is negative  ____________________________________________   PROCEDURES  Procedure(s) performed: Crutches given by the nursing staff  Procedures    ____________________________________________   INITIAL IMPRESSION / ASSESSMENT AND PLAN / ED COURSE  Pertinent labs & imaging results that were available during my care of the patient were reviewed by me and considered in my medical decision making (see chart for details).   Patient is an 42 year old female presents emergency department complaining of left foot pain today after feeling a pop in the foot.  She had surgery in June on the left foot and has a follow-up appointment tomorrow with Dr. Vickki Muff.  On physical exam the left foot is minimally tender.  There is some swelling noted across the midfoot but question if this is from the recent surgery.  Surgical scar is intact and appears well.  Metatarsals are not tender.  X-ray of the left foot is negative.  Explained the findings to the patient.  The patient is difficult to talk with when discussing the treatment plan.  She is agitated and belligerent.  She was instructed to follow-up with Dr. Vickki Muff tomorrow.   Explained to her that since there is not a broken bone there is really nothing more we can do in the best course of action is with Dr. Vickki Muff.  She was discharged in stable condition.     As part of my medical decision making, I reviewed the following data within the [place] notes reviewed and incorporated, Old chart reviewed, Radiograph reviewed x-ray left foot is negative, Notes from prior ED visits and [place] Controlled Substance Database  ____________________________________________   FINAL CLINICAL IMPRESSION(S) / ED DIAGNOSES  Final diagnoses:  Foot pain, left      NEW MEDICATIONS STARTED DURING THIS VISIT:  Discharge Medication List as of 12/01/2017 11:38 AM       Note:  This document was prepared using Dragon voice recognition software and may include unintentional dictation errors.    Larose Batres,  Linden Dolin, PA-C 12/01/17 1321    Harvest Dark, MD 12/01/17 1420

## 2017-12-01 NOTE — ED Notes (Signed)
Upon entering room for discharge this nurse stated patient would be receiving crutches and attempted to give instructions.  Patient states "just give me them crutches so I can get out of here, this place is only good for bandaids and birth.  I come up here and they don't ever give me pain medication."  Pt took discharge paperwork but refused vitals, review of paperwork, and discharge signature.

## 2017-12-01 NOTE — ED Triage Notes (Signed)
Patient presents to the ED with foot pain and deformity that began this morning.  Patient states she was walking when she felt a "pop".  Patient had foot surgery on June 14th.

## 2018-01-06 ENCOUNTER — Other Ambulatory Visit: Payer: Self-pay | Admitting: Podiatry

## 2018-01-08 ENCOUNTER — Encounter
Admission: RE | Admit: 2018-01-08 | Discharge: 2018-01-08 | Disposition: A | Discharge status: Home / Self Care | Payer: Medicaid Other | Source: Ambulatory Visit | Attending: Podiatry | Admitting: Podiatry

## 2018-01-08 ENCOUNTER — Other Ambulatory Visit: Payer: Self-pay

## 2018-01-08 MED ORDER — CLINDAMYCIN PHOSPHATE 900 MG/50ML IV SOLN
900.0000 mg | INTRAVENOUS | Status: AC
  Administered 2018-01-09: 900 mg via INTRAVENOUS

## 2018-01-08 NOTE — Patient Instructions (Signed)
Your procedure is scheduled on: 01/09/18 Report to Day Surgery. [facility] SECOND FLOOR To find out your arrival time please call [phone] between 1PM - 3PM on 01/08/18.  Remember: Instructions that are not followed completely may result in serious medical risk,  up to and including death, or upon the discretion of your surgeon and anesthesiologist your  surgery may need to be rescheduled.     _X__ 1. Do not eat food after midnight the night before your procedure.                 No gum chewing or hard candies. You may drink clear liquids up to 2 hours                 before you are scheduled to arrive for your surgery- DO not drink clear                 liquids within 2 hours of the start of your surgery.                 Clear Liquids include:  water, apple juice without pulp, clear carbohydrate                 drink such as Clearfast of Gatorade, Black Coffee or Tea (Do not add                 anything to coffee or tea).  __X__2.  On the morning of surgery brush your teeth with toothpaste and water, you                may rinse your mouth with mouthwash if you wish.  Do not swallow any toothpaste of mouthwash.     _X__ 3.  No Alcohol for 24 hours before or after surgery.   _X__ 4.  Do Not Smoke or use e-cigarettes For 24 Hours Prior to Your Surgery.                 Do not use any chewable tobacco products for at least 6 hours prior to                 surgery.  ____  5.  Bring all medications with you on the day of surgery if instructed.   _X_  6.  Notify your doctor if there is any change in your medical condition      (cold, fever, infections).     Do not wear jewelry, make-up, hairpins, clips or nail polish. Do not wear lotions, powders, or perfumes. You may wear deodorant. Do not shave 48 hours prior to surgery. Men may shave face and neck. Do not bring valuables to the hospital.    [facility] is not responsible for any belongings or  valuables.  Contacts, dentures or bridgework may not be worn into surgery. Leave your suitcase in the car. After surgery it may be brought to your room. For patients admitted to the hospital, discharge time is determined by your treatment team.   Patients discharged the day of surgery will not be allowed to drive home.     _X___ Take these medicines the morning of surgery with A SIP OF WATER:    1.KEFLEX  2. CIPRO  3.   4.  5.  6.  ____ Fleet Enema (as directed)   ____ Use CHG Soap as directed  ____ Use inhalers on the day of surgery  ____ Stop metformin 2 days prior to surgery  ____ Take 1/2 of usual insulin dose the night before surgery. No insulin the morning          of surgery.   ____ Stop Coumadin/Plavix/aspirin on  __X__ Stop Anti-inflammatories on     10/17.19   ____ Stop supplements until after surgery.    ____ Bring C-Pap to the hospital.

## 2018-01-09 ENCOUNTER — Ambulatory Visit: Payer: Self-pay | Admitting: Anesthesiology

## 2018-01-09 ENCOUNTER — Encounter: Payer: Self-pay | Admitting: Emergency Medicine

## 2018-01-09 ENCOUNTER — Observation Stay
Admission: EM | Admit: 2018-01-09 | Discharge: 2018-01-10 | Disposition: A | Discharge status: Home / Self Care | Payer: Self-pay | Attending: Internal Medicine | Admitting: Internal Medicine

## 2018-01-09 ENCOUNTER — Encounter: Payer: Self-pay | Admitting: *Deleted

## 2018-01-09 ENCOUNTER — Ambulatory Visit: Payer: Self-pay

## 2018-01-09 ENCOUNTER — Other Ambulatory Visit: Payer: Self-pay

## 2018-01-09 ENCOUNTER — Encounter
Admission: RE | Disposition: A | Discharge status: Still In Hospital | Payer: Self-pay | Source: Ambulatory Visit | Attending: Podiatry

## 2018-01-09 ENCOUNTER — Ambulatory Visit
Admission: RE | Admit: 2018-01-09 | Discharge: 2018-01-09 | Disposition: A | Discharge status: Still In Hospital | Payer: Self-pay | Source: Ambulatory Visit | Attending: Podiatry | Admitting: Podiatry

## 2018-01-09 ENCOUNTER — Observation Stay: Payer: Medicaid Other

## 2018-01-09 DIAGNOSIS — Z79899 Other long term (current) drug therapy: Secondary | ICD-10-CM | POA: Insufficient documentation

## 2018-01-09 DIAGNOSIS — Z8673 Personal history of transient ischemic attack (TIA), and cerebral infarction without residual deficits: Secondary | ICD-10-CM | POA: Insufficient documentation

## 2018-01-09 DIAGNOSIS — K219 Gastro-esophageal reflux disease without esophagitis: Secondary | ICD-10-CM | POA: Insufficient documentation

## 2018-01-09 DIAGNOSIS — B9561 Methicillin susceptible Staphylococcus aureus infection as the cause of diseases classified elsewhere: Secondary | ICD-10-CM | POA: Insufficient documentation

## 2018-01-09 DIAGNOSIS — F17210 Nicotine dependence, cigarettes, uncomplicated: Secondary | ICD-10-CM | POA: Insufficient documentation

## 2018-01-09 DIAGNOSIS — L02612 Cutaneous abscess of left foot: Secondary | ICD-10-CM | POA: Insufficient documentation

## 2018-01-09 DIAGNOSIS — R200 Anesthesia of skin: Secondary | ICD-10-CM | POA: Diagnosis present

## 2018-01-09 DIAGNOSIS — Z880 Allergy status to penicillin: Secondary | ICD-10-CM | POA: Insufficient documentation

## 2018-01-09 DIAGNOSIS — F329 Major depressive disorder, single episode, unspecified: Secondary | ICD-10-CM | POA: Insufficient documentation

## 2018-01-09 DIAGNOSIS — F209 Schizophrenia, unspecified: Secondary | ICD-10-CM | POA: Insufficient documentation

## 2018-01-09 DIAGNOSIS — R531 Weakness: Secondary | ICD-10-CM | POA: Insufficient documentation

## 2018-01-09 DIAGNOSIS — R2681 Unsteadiness on feet: Secondary | ICD-10-CM | POA: Insufficient documentation

## 2018-01-09 DIAGNOSIS — Z7982 Long term (current) use of aspirin: Secondary | ICD-10-CM | POA: Insufficient documentation

## 2018-01-09 DIAGNOSIS — R202 Paresthesia of skin: Secondary | ICD-10-CM

## 2018-01-09 DIAGNOSIS — F909 Attention-deficit hyperactivity disorder, unspecified type: Secondary | ICD-10-CM | POA: Insufficient documentation

## 2018-01-09 DIAGNOSIS — F319 Bipolar disorder, unspecified: Secondary | ICD-10-CM | POA: Insufficient documentation

## 2018-01-09 DIAGNOSIS — F17200 Nicotine dependence, unspecified, uncomplicated: Secondary | ICD-10-CM | POA: Insufficient documentation

## 2018-01-09 DIAGNOSIS — G459 Transient cerebral ischemic attack, unspecified: Principal | ICD-10-CM | POA: Insufficient documentation

## 2018-01-09 HISTORY — PX: INCISION AND DRAINAGE: SHX5863

## 2018-01-09 LAB — TROPONIN I: Troponin I: <0.03 ng/mL (ref <0.03)

## 2018-01-09 LAB — URINE DRUG SCREEN, QUALITATIVE (ARMC ONLY)
AMPHETAMINES, UR SCREEN: NOT DETECTED
AMPHETAMINES, UR SCREEN: NOT DETECTED
BENZODIAZEPINE, UR SCRN: NOT DETECTED
Barbiturates, Ur Screen: NOT DETECTED
Barbiturates, Ur Screen: NOT DETECTED
Benzodiazepine, Ur Scrn: POSITIVE — AB
COCAINE METABOLITE, UR ~~LOC~~: NOT DETECTED
Cannabinoid 50 Ng, Ur ~~LOC~~: POSITIVE — AB
Cannabinoid 50 Ng, Ur ~~LOC~~: NOT DETECTED
Cocaine Metabolite,Ur ~~LOC~~: NOT DETECTED
MDMA (ECSTASY) UR SCREEN: NOT DETECTED
MDMA (Ecstasy)Ur Screen: NOT DETECTED
METHADONE SCREEN, URINE: NOT DETECTED
Methadone Scn, Ur: NOT DETECTED
OPIATE, UR SCREEN: NOT DETECTED
OPIATE, UR SCREEN: NOT DETECTED
PHENCYCLIDINE (PCP) UR S: NOT DETECTED
PHENCYCLIDINE (PCP) UR S: NOT DETECTED
Tricyclic, Ur Screen: NOT DETECTED
Tricyclic, Ur Screen: NOT DETECTED

## 2018-01-09 LAB — DIFFERENTIAL
ABS IMMATURE GRANULOCYTES: 0 10*3/uL (ref 0.00–0.07)
BASOS PCT: 0 %
Basophils Absolute: 0 10*3/uL (ref 0.0–0.1)
Eosinophils Absolute: 0.1 10*3/uL (ref 0.0–0.5)
Eosinophils Relative: 1 %
IMMATURE GRANULOCYTES: 0 %
Lymphocytes Relative: 56 %
Lymphs Abs: 3.5 10*3/uL (ref 0.7–4.0)
Monocytes Absolute: 0.3 10*3/uL (ref 0.1–1.0)
Monocytes Relative: 4 %
NEUTROS ABS: 2.4 10*3/uL (ref 1.7–7.7)
Neutrophils Relative %: 39 %

## 2018-01-09 LAB — COMPREHENSIVE METABOLIC PANEL
ALBUMIN: 3.9 g/dL (ref 3.5–5.0)
ALT: 20 U/L (ref 0–44)
AST: 25 U/L (ref 15–41)
Alkaline Phosphatase: 64 U/L (ref 38–126)
Anion gap: 7 (ref 5–15)
BUN: 8 mg/dL (ref 6–20)
CHLORIDE: 107 mmol/L (ref 98–111)
CO2: 25 mmol/L (ref 22–32)
Calcium: 8.8 mg/dL — ABNORMAL LOW (ref 8.9–10.3)
Creatinine, Ser: 0.78 mg/dL (ref 0.44–1.00)
GFR calc Af Amer: >60 mL/min (ref >60)
GFR calc non Af Amer: >60 mL/min (ref >60)
GLUCOSE: 101 mg/dL — AB (ref 70–99)
Potassium: 3.9 mmol/L (ref 3.5–5.1)
Sodium: 139 mmol/L (ref 135–145)
Total Bilirubin: 0.5 mg/dL (ref 0.3–1.2)
Total Protein: 6.8 g/dL (ref 6.5–8.1)

## 2018-01-09 LAB — LIPID PANEL
CHOL/HDL RATIO: 3.1 ratio
CHOLESTEROL: 180 mg/dL (ref 0–200)
HDL: 59 mg/dL (ref >40)
LDL Cholesterol: 108 mg/dL — ABNORMAL HIGH (ref 0–99)
TRIGLYCERIDES: 65 mg/dL (ref <150)
VLDL: 13 mg/dL (ref 0–40)

## 2018-01-09 LAB — URINALYSIS, COMPLETE (UACMP) WITH MICROSCOPIC
BACTERIA UA: NONE SEEN
Bilirubin Urine: NEGATIVE
Glucose, UA: NEGATIVE mg/dL
KETONES UR: NEGATIVE mg/dL
Leukocytes, UA: NEGATIVE
Nitrite: NEGATIVE
PROTEIN: NEGATIVE mg/dL
Specific Gravity, Urine: 1.003 — ABNORMAL LOW (ref 1.005–1.030)
pH: 7 (ref 5.0–8.0)

## 2018-01-09 LAB — CBC
HCT: 39.9 % (ref 36.0–46.0)
HEMOGLOBIN: 13.5 g/dL (ref 12.0–15.0)
MCH: 32.2 pg (ref 26.0–34.0)
MCHC: 33.8 g/dL (ref 30.0–36.0)
MCV: 95.2 fL (ref 80.0–100.0)
Platelets: 254 10*3/uL (ref 150–400)
RBC: 4.19 MIL/uL (ref 3.87–5.11)
RDW: 12.8 % (ref 11.5–15.5)
WBC: 6.3 10*3/uL (ref 4.0–10.5)
nRBC: 0 % (ref 0.0–0.2)

## 2018-01-09 LAB — POCT PREGNANCY, URINE
PREG TEST UR: NEGATIVE
Preg Test, Ur: NEGATIVE

## 2018-01-09 LAB — PROTIME-INR
INR: 1
Prothrombin Time: 13.1 seconds (ref 11.4–15.2)

## 2018-01-09 LAB — GLUCOSE, CAPILLARY: Glucose-Capillary: 95 mg/dL (ref 70–99)

## 2018-01-09 LAB — HCG, QUANTITATIVE, PREGNANCY: hCG, Beta Chain, Quant, S: <1 m[IU]/mL (ref <5)

## 2018-01-09 LAB — APTT: aPTT: 28 seconds (ref 24–36)

## 2018-01-09 LAB — ETHANOL: Alcohol, Ethyl (B): <10 mg/dL (ref <10)

## 2018-01-09 SURGERY — INCISION AND DRAINAGE
Anesthesia: General | Site: Foot | Laterality: Left

## 2018-01-09 MED ORDER — LIDOCAINE HCL (PF) 1 % IJ SOLN
INTRAMUSCULAR | Status: AC
  Filled 2018-01-09: qty 30

## 2018-01-09 MED ORDER — BUPIVACAINE HCL (PF) 0.5 % IJ SOLN
INTRAMUSCULAR | Status: AC
  Filled 2018-01-09: qty 30

## 2018-01-09 MED ORDER — ACETAMINOPHEN 650 MG RE SUPP: 650.0000 mg | RECTAL | Status: DC | PRN

## 2018-01-09 MED ORDER — FENTANYL CITRATE (PF) 100 MCG/2ML IJ SOLN
INTRAMUSCULAR | Status: DC | PRN
  Administered 2018-01-09: 25 ug via INTRAVENOUS
  Administered 2018-01-09: 50 ug via INTRAVENOUS

## 2018-01-09 MED ORDER — CLINDAMYCIN PHOSPHATE 900 MG/50ML IV SOLN
INTRAVENOUS | Status: AC
  Administered 2018-01-09: 900 mg via INTRAVENOUS
  Filled 2018-01-09: qty 50

## 2018-01-09 MED ORDER — LIDOCAINE-EPINEPHRINE 1 %-1:100000 IJ SOLN
INTRAMUSCULAR | Status: AC
  Filled 2018-01-09: qty 1

## 2018-01-09 MED ORDER — PROPOFOL 10 MG/ML IV BOLUS
INTRAVENOUS | Status: DC | PRN
  Administered 2018-01-09: 150 mg via INTRAVENOUS

## 2018-01-09 MED ORDER — ONDANSETRON HCL 4 MG/2ML IJ SOLN: 4.0000 mg | Freq: Four times a day (QID) | INTRAMUSCULAR | Status: DC | PRN

## 2018-01-09 MED ORDER — OXYCODONE-ACETAMINOPHEN 5-325 MG PO TABS: 1.0000 | ORAL_TABLET | Freq: Four times a day (QID) | ORAL | 0 refills | Status: DC | PRN

## 2018-01-09 MED ORDER — MEPERIDINE HCL 50 MG/ML IJ SOLN: 6.2500 mg | INTRAMUSCULAR | Status: DC | PRN

## 2018-01-09 MED ORDER — SODIUM CHLORIDE 0.9 % IV SOLN: INTRAVENOUS | Status: DC

## 2018-01-09 MED ORDER — ONDANSETRON HCL 4 MG/2ML IJ SOLN
INTRAMUSCULAR | Status: DC | PRN
  Administered 2018-01-09: 4 mg via INTRAVENOUS

## 2018-01-09 MED ORDER — CIPROFLOXACIN HCL 500 MG PO TABS
500.0000 mg | ORAL_TABLET | Freq: Two times a day (BID) | ORAL | Status: DC
  Administered 2018-01-10: 500 mg via ORAL
  Filled 2018-01-09 (×2): qty 1

## 2018-01-09 MED ORDER — POVIDONE-IODINE 7.5 % EX SOLN: Freq: Once | CUTANEOUS | Status: DC

## 2018-01-09 MED ORDER — OXYCODONE-ACETAMINOPHEN 5-325 MG PO TABS
1.0000 | ORAL_TABLET | Freq: Four times a day (QID) | ORAL | Status: DC | PRN
  Administered 2018-01-09: 20:00:00 1 via ORAL
  Administered 2018-01-10 (×2): 2 via ORAL
  Filled 2018-01-09: qty 1
  Filled 2018-01-09 (×2): qty 2

## 2018-01-09 MED ORDER — BUPIVACAINE HCL 0.5 % IJ SOLN
INTRAMUSCULAR | Status: DC | PRN
  Administered 2018-01-09: 5 mL

## 2018-01-09 MED ORDER — ENOXAPARIN SODIUM 40 MG/0.4ML ~~LOC~~ SOLN
40.0000 mg | SUBCUTANEOUS | Status: DC
  Administered 2018-01-09: 40 mg via SUBCUTANEOUS
  Filled 2018-01-09: qty 0.4

## 2018-01-09 MED ORDER — LACTATED RINGERS IV SOLN
INTRAVENOUS | Status: DC
  Administered 2018-01-09: 06:00:00 via INTRAVENOUS

## 2018-01-09 MED ORDER — FENTANYL CITRATE (PF) 100 MCG/2ML IJ SOLN
INTRAMUSCULAR | Status: AC
  Filled 2018-01-09: qty 2

## 2018-01-09 MED ORDER — FAMOTIDINE 20 MG PO TABS
20.0000 mg | ORAL_TABLET | Freq: Once | ORAL | Status: AC
  Administered 2018-01-09: 20 mg via ORAL

## 2018-01-09 MED ORDER — LIDOCAINE-EPINEPHRINE 1 %-1:100000 IJ SOLN
INTRAMUSCULAR | Status: DC | PRN
  Administered 2018-01-09: 5 mL

## 2018-01-09 MED ORDER — ONDANSETRON HCL 4 MG PO TABS: 4.0000 mg | ORAL_TABLET | Freq: Four times a day (QID) | ORAL | Status: DC | PRN

## 2018-01-09 MED ORDER — OXYCODONE HCL 5 MG PO TABS: 5.0000 mg | ORAL_TABLET | Freq: Once | ORAL | Status: DC | PRN

## 2018-01-09 MED ORDER — CEPHALEXIN 500 MG PO CAPS
500.0000 mg | ORAL_CAPSULE | Freq: Four times a day (QID) | ORAL | Status: DC
  Administered 2018-01-09 – 2018-01-10 (×3): 500 mg via ORAL
  Filled 2018-01-09 (×4): qty 1

## 2018-01-09 MED ORDER — MIDAZOLAM HCL 2 MG/2ML IJ SOLN
INTRAMUSCULAR | Status: DC | PRN
  Administered 2018-01-09: 2 mg via INTRAVENOUS

## 2018-01-09 MED ORDER — LIDOCAINE HCL (PF) 2 % IJ SOLN
INTRAMUSCULAR | Status: AC
  Filled 2018-01-09: qty 10

## 2018-01-09 MED ORDER — PHENYLEPHRINE HCL 10 MG/ML IJ SOLN
INTRAMUSCULAR | Status: AC
  Filled 2018-01-09: qty 1

## 2018-01-09 MED ORDER — ASPIRIN 81 MG PO CHEW
324.0000 mg | CHEWABLE_TABLET | Freq: Once | ORAL | Status: AC
  Administered 2018-01-09: 324 mg via ORAL
  Filled 2018-01-09: qty 4

## 2018-01-09 MED ORDER — PROPOFOL 10 MG/ML IV BOLUS
INTRAVENOUS | Status: AC
  Filled 2018-01-09: qty 20

## 2018-01-09 MED ORDER — PROMETHAZINE HCL 25 MG/ML IJ SOLN: 6.2500 mg | INTRAMUSCULAR | Status: DC | PRN

## 2018-01-09 MED ORDER — CIPROFLOXACIN HCL 500 MG PO TABS: 500.0000 mg | ORAL_TABLET | Freq: Two times a day (BID) | ORAL | 0 refills | Status: AC

## 2018-01-09 MED ORDER — FAMOTIDINE 20 MG PO TABS
ORAL_TABLET | ORAL | Status: AC
  Administered 2018-01-09: 20 mg via ORAL
  Filled 2018-01-09: qty 1

## 2018-01-09 MED ORDER — LIDOCAINE HCL (CARDIAC) PF 100 MG/5ML IV SOSY
PREFILLED_SYRINGE | INTRAVENOUS | Status: DC | PRN
  Administered 2018-01-09: 100 mg via INTRAVENOUS

## 2018-01-09 MED ORDER — DEXAMETHASONE SODIUM PHOSPHATE 10 MG/ML IJ SOLN
INTRAMUSCULAR | Status: AC
  Filled 2018-01-09: qty 1

## 2018-01-09 MED ORDER — STROKE: EARLY STAGES OF RECOVERY BOOK: Freq: Once | Status: DC

## 2018-01-09 MED ORDER — ACETAMINOPHEN 325 MG PO TABS
650.0000 mg | ORAL_TABLET | ORAL | Status: DC | PRN
  Administered 2018-01-10: 650 mg via ORAL
  Filled 2018-01-09: qty 2

## 2018-01-09 MED ORDER — ONDANSETRON HCL 4 MG/2ML IJ SOLN
INTRAMUSCULAR | Status: AC
  Filled 2018-01-09: qty 2

## 2018-01-09 MED ORDER — ASPIRIN 81 MG PO CHEW
81.0000 mg | CHEWABLE_TABLET | Freq: Every day | ORAL | Status: DC
  Administered 2018-01-10: 81 mg via ORAL
  Filled 2018-01-09: qty 1

## 2018-01-09 MED ORDER — ACETAMINOPHEN 160 MG/5ML PO SOLN
650.0000 mg | ORAL | Status: DC | PRN
  Filled 2018-01-09: qty 20.3

## 2018-01-09 MED ORDER — BUPIVACAINE HCL (PF) 0.25 % IJ SOLN
INTRAMUSCULAR | Status: AC
  Filled 2018-01-09: qty 30

## 2018-01-09 MED ORDER — METHYLENE BLUE 0.5 % INJ SOLN
INTRAVENOUS | Status: AC
  Filled 2018-01-09: qty 10

## 2018-01-09 MED ORDER — CEPHALEXIN 500 MG PO CAPS: 500.0000 mg | ORAL_CAPSULE | Freq: Four times a day (QID) | ORAL | 0 refills | Status: AC

## 2018-01-09 MED ORDER — FENTANYL CITRATE (PF) 100 MCG/2ML IJ SOLN: 25.0000 ug | INTRAMUSCULAR | Status: DC | PRN

## 2018-01-09 MED ORDER — MIDAZOLAM HCL 2 MG/2ML IJ SOLN
INTRAMUSCULAR | Status: AC
  Filled 2018-01-09: qty 2

## 2018-01-09 MED ORDER — OXYCODONE HCL 5 MG/5ML PO SOLN: 5.0000 mg | Freq: Once | ORAL | Status: DC | PRN

## 2018-01-09 SURGICAL SUPPLY — 67 items
"PENCIL ELECTRO HAND CTR " (MISCELLANEOUS) ×1 IMPLANT
BANDAGE ACE 4X5 VEL STRL LF (GAUZE/BANDAGES/DRESSINGS) ×2 IMPLANT
BLADE OSC/SAGITTAL MD 5.5X18 (BLADE) IMPLANT
BLADE OSCILLATING/SAGITTAL (BLADE)
BLADE SURG 15 STRL LF DISP TIS (BLADE) ×1 IMPLANT
BLADE SURG 15 STRL SS (BLADE) ×1
BLADE SW THK.38XMED LNG THN (BLADE) IMPLANT
BNDG COHESIVE 4X5 TAN STRL (GAUZE/BANDAGES/DRESSINGS) ×1 IMPLANT
BNDG COHESIVE 6X5 TAN STRL LF (GAUZE/BANDAGES/DRESSINGS) ×2 IMPLANT
BNDG CONFORM 3 STRL LF (GAUZE/BANDAGES/DRESSINGS) ×2 IMPLANT
BNDG ESMARK 4X12 TAN STRL LF (GAUZE/BANDAGES/DRESSINGS) ×1 IMPLANT
BNDG GAUZE 4.5X4.1 6PLY STRL (MISCELLANEOUS) ×2 IMPLANT
CANISTER SUCT 1200ML W/VALVE (MISCELLANEOUS) ×2 IMPLANT
CANISTER SUCT 3000ML PPV (MISCELLANEOUS) ×2 IMPLANT
COVER WAND RF STERILE (DRAPES) ×2 IMPLANT
CUFF TOURN 18 STER (MISCELLANEOUS) ×2 IMPLANT
CUFF TOURN DUAL PL 12 NO SLV (MISCELLANEOUS) ×1 IMPLANT
DRAPE FLUOR MINI C-ARM 54X84 (DRAPES) IMPLANT
DRAPE XRAY CASSETTE 23X24 (DRAPES) IMPLANT
DRESSING ALLEVYN 4X4 (MISCELLANEOUS) ×1 IMPLANT
DURAPREP 26ML APPLICATOR (WOUND CARE) ×1 IMPLANT
ELECT REM PT RETURN 9FT ADLT (ELECTROSURGICAL) ×2
ELECTRODE REM PT RTRN 9FT ADLT (ELECTROSURGICAL) ×1 IMPLANT
GAUZE PACKING 1/4 X5 YD (GAUZE/BANDAGES/DRESSINGS) ×2 IMPLANT
GAUZE PACKING IODOFORM 1X5 (MISCELLANEOUS) ×2 IMPLANT
GAUZE PETRO XEROFOAM 1X8 (MISCELLANEOUS) ×2 IMPLANT
GAUZE SPONGE 4X4 12PLY STRL (GAUZE/BANDAGES/DRESSINGS) ×2 IMPLANT
GAUZE STRETCH 2X75IN STRL (MISCELLANEOUS) ×2 IMPLANT
GLOVE BIO SURGEON STRL SZ7.5 (GLOVE) ×2 IMPLANT
GLOVE INDICATOR 8.0 STRL GRN (GLOVE) ×2 IMPLANT
GOWN STRL REUS W/ TWL LRG LVL3 (GOWN DISPOSABLE) ×2 IMPLANT
GOWN STRL REUS W/TWL LRG LVL3 (GOWN DISPOSABLE) ×2
GOWN STRL REUS W/TWL MED LVL3 (GOWN DISPOSABLE) ×2 IMPLANT
HANDPIECE VERSAJET DEBRIDEMENT (MISCELLANEOUS) IMPLANT
IV NS 1000ML (IV SOLUTION) ×1
IV NS 1000ML BAXH (IV SOLUTION) ×1 IMPLANT
KIT DRSG VAC SLVR GRANUFM (MISCELLANEOUS) ×1 IMPLANT
KIT TURNOVER KIT A (KITS) ×2 IMPLANT
LABEL OR SOLS (LABEL) ×2 IMPLANT
NDL FILTER BLUNT 18X1 1/2 (NEEDLE) ×1 IMPLANT
NDL HYPO 25X1 1.5 SAFETY (NEEDLE) ×1 IMPLANT
NEEDLE FILTER BLUNT 18X 1/2SAF (NEEDLE) ×1
NEEDLE FILTER BLUNT 18X1 1/2 (NEEDLE) ×1 IMPLANT
NEEDLE HYPO 25X1 1.5 SAFETY (NEEDLE) ×2 IMPLANT
NS IRRIG 500ML POUR BTL (IV SOLUTION) ×2 IMPLANT
PACK EXTREMITY ARMC (MISCELLANEOUS) ×2 IMPLANT
PAD ABD DERMACEA PRESS 5X9 (GAUZE/BANDAGES/DRESSINGS) ×2 IMPLANT
PENCIL ELECTRO HAND CTR (MISCELLANEOUS) ×2 IMPLANT
PULSAVAC PLUS IRRIG FAN TIP (DISPOSABLE)
RASP SM TEAR CROSS CUT (RASP) IMPLANT
SHIELD FULL FACE ANTIFOG 7M (MISCELLANEOUS) ×1 IMPLANT
SOL .9 NS 3000ML IRR  AL (IV SOLUTION)
SOL .9 NS 3000ML IRR UROMATIC (IV SOLUTION) ×1 IMPLANT
STOCKINETTE IMPERVIOUS 9X36 MD (GAUZE/BANDAGES/DRESSINGS) ×2 IMPLANT
SUT ETHILON 2 0 FS 18 (SUTURE) ×2 IMPLANT
SUT ETHILON 4-0 (SUTURE)
SUT ETHILON 4-0 FS2 18XMFL BLK (SUTURE)
SUT VIC AB 3-0 SH 27 (SUTURE) ×1
SUT VIC AB 3-0 SH 27X BRD (SUTURE) ×1 IMPLANT
SUT VIC AB 4-0 FS2 27 (SUTURE) ×2 IMPLANT
SUTURE ETHLN 4-0 FS2 18XMF BLK (SUTURE) ×1 IMPLANT
SWAB CULTURE AMIES ANAERIB BLU (MISCELLANEOUS) ×1 IMPLANT
SYR 10ML LL (SYRINGE) ×2 IMPLANT
SYR 3ML LL SCALE MARK (SYRINGE) ×2 IMPLANT
TIP FAN IRRIG PULSAVAC PLUS (DISPOSABLE) ×1 IMPLANT
TRAY PREP VAG/GEN (MISCELLANEOUS) ×1 IMPLANT
WND VAC CANISTER 500ML (MISCELLANEOUS) ×2 IMPLANT

## 2018-01-09 NOTE — Code Documentation (Signed)
Pt in PACU for outpatient I&D of left foot, post surgery pt woke up with left sided numbness, surgeon notified and code stroke activated, CBG 95, Dr. Doy Mince at bedside, pt taken to CT and then ED room 7, Dr. Joni Fears at bedside for eval, initial NIHSS 7, pt tearful, no tPA given, family at bedside, report off to [employer]

## 2018-01-09 NOTE — Progress Notes (Signed)
Pt c/o tingling to left side of face, arm, and half of left leg. Dr. Randa Lynn evaluated pt and then discussed with Dr. Vickki Muff who also evaluated her and is now paging neurology. Jozy Mcphearson E 9:32 AM 01/09/2018

## 2018-01-09 NOTE — Transfer of Care (Signed)
Immediate Anesthesia Transfer of Care Note  Patient: Carol Crawford  Procedure(s) Performed: INCISION AND DRAINAGE-below fascia foot; multiple (Left Foot)  Patient Location: PACU  Anesthesia Type:General  Level of Consciousness: drowsy and responds to stimulation  Airway & Oxygen Therapy: Patient Spontanous Breathing and Patient connected to face mask oxygen  Post-op Assessment: Report given to RN and Post -op Vital signs reviewed and stable  Post vital signs: Reviewed and stable  Last Vitals:  Vitals Value Taken Time  BP 107/76 01/09/2018  8:12 AM  Temp    Pulse 56 01/09/2018  8:12 AM  Resp 13 01/09/2018  8:12 AM  SpO2 100 % 01/09/2018  8:12 AM  Vitals shown include unvalidated device data.  Last Pain:  Vitals:   01/09/18 0604  TempSrc: Tympanic  PainSc: 7          Complications: No apparent anesthesia complications

## 2018-01-09 NOTE — ED Notes (Signed)
Patient is resting comfortably. 

## 2018-01-09 NOTE — ED Triage Notes (Signed)
Pt brought to ER from PACU after having left foot I&D. Local anesthetic used on left leg for procedure as well as general anesthesia. Pt was last known well at [place] prior to surgery. When she woke up from surgery, she c/o lefy side facial tingling, left arm tingling, and left lower leg tingling. Pt has hx of similar sx following surgery in June but states these sx were worse. Code stroke was called and neurologist reported to beside. Pt was sent to CT scan prior to coming to ER. Upon ER arrival, pt was noted to have left arm weakness and tingling. Left leg weakness but this is expected post anesthesia. She is A&Ox4 at this time.

## 2018-01-09 NOTE — Anesthesia Preprocedure Evaluation (Signed)
Anesthesia Evaluation  Patient identified by MRN, date of birth, ID band Patient awake    Reviewed: Allergy & Precautions, NPO status , Patient's Chart, lab work & pertinent test results  History of Anesthesia Complications Negative for: history of anesthetic complications  Airway Mallampati: II  TM Distance: >3 FB Neck ROM: Full    Dental no notable dental hx.    Pulmonary neg sleep apnea, neg COPD, Current Smoker,    breath sounds clear to auscultation- rhonchi (-) wheezing      Cardiovascular Exercise Tolerance: Good (-) hypertension(-) CAD, (-) Past MI, (-) Cardiac Stents and (-) CABG  Rhythm:Regular Rate:Normal - Systolic murmurs and - Diastolic murmurs    Neuro/Psych  Headaches, PSYCHIATRIC DISORDERS Depression Bipolar Disorder Schizophrenia    GI/Hepatic Neg liver ROS, GERD  ,  Endo/Other  negative endocrine ROSneg diabetes  Renal/GU negative Renal ROS     Musculoskeletal negative musculoskeletal ROS (+)   Abdominal (+) - obese,   Peds  Hematology negative hematology ROS (+)   Anesthesia Other Findings Past Medical History: No date: ADHD No date: Bipolar 1 disorder (HCC) No date: Depressed No date: GERD (gastroesophageal reflux disease)     Comment:  OCC No date: Headache     Comment:  MIGRAINES No date: Schizophrenia ([place])   Reproductive/Obstetrics                             Anesthesia Physical Anesthesia Plan  ASA: II  Anesthesia Plan: General   Post-op Pain Management:    Induction: Intravenous  PONV Risk Score and Plan: 1  Airway Management Planned: LMA  Additional Equipment:   Intra-op Plan:   Post-operative Plan:   Informed Consent: I have reviewed the patients History and Physical, chart, labs and discussed the procedure including the risks, benefits and alternatives for the proposed anesthesia with the patient or authorized representative who has  indicated his/her understanding and acceptance.   Dental advisory given  Plan Discussed with: CRNA and Anesthesiologist  Anesthesia Plan Comments:         Anesthesia Quick Evaluation

## 2018-01-09 NOTE — Discharge Instructions (Addendum)
[place] [address] [place]   1. Take your medication as prescribed.  Pain medication should be taken only as needed.  2. Keep the dressing clean, dry and intact.  If you see bleeding or drainage on the bandage you may change with gauze and a gauze wrap.  3. Keep your foot elevated above the heart level for the first 48 hours.  4. Walking to the bathroom and brief periods of walking are acceptable, unless we have instructed you to be non-weight bearing.  5. Always wear your post-op shoe when walking.  Always use your crutches if you are to be non-weight bearing.  6. Do not take a shower. Baths are permissible as long as the foot is kept out of the water.   7. Every hour you are awake:  - Bend your knee 15 times. - Flex foot 15 times - Massage calf 15 times  8. Call [facility] [phone]) if any of the following problems occur: - You develop a temperature or fever. - The bandage becomes saturated with blood. - Medication does not stop your pain. - Injury of the foot occurs. - Any symptoms of infection including redness, odor, or red streaks running from wound.  AMBULATORY SURGERY  DISCHARGE INSTRUCTIONS   1) The drugs that you were given will stay in your system until tomorrow so for the next 24 hours you should not:  A) Drive an automobile B) Make any legal decisions C) Drink any alcoholic beverage   2) You may resume regular meals tomorrow.  Today it is better to start with liquids and gradually work up to solid foods.  You may eat anything you prefer, but it is better to start with liquids, then soup and crackers, and gradually work up to solid foods.   3) Please notify your doctor immediately if you have any unusual bleeding, trouble breathing, redness and pain at the surgery site, drainage, fever, or pain not relieved by  medication.    4) Additional Instructions:        Please contact your physician with any problems or Same Day Surgery at [phone], Monday through Friday 6 am to 4 pm, or [place] at [facility] number at [phone].

## 2018-01-09 NOTE — OR Nursing (Signed)
Pt did not come to postop. Pt was having numbness unilaterally.

## 2018-01-09 NOTE — Progress Notes (Signed)
CODE STROKE- PHARMACY COMMUNICATION   Time CODE STROKE called/page received:0938  Time response to CODE STROKE was made (in person or via phone): 0940  Time Stroke Kit retrieved from [place] (only if needed):N/A  Name of Provider/Nurse contacted:Olivia Broomer   Past Medical History:  Diagnosis Date  . ADHD   . Bipolar 1 disorder ([place])   . Depressed   . GERD (gastroesophageal reflux disease)    OCC  . Headache    MIGRAINES  . Schizophrenia ([place])    Prior to Admission medications   Medication Sig Start Date End Date Taking? Authorizing Provider  Ca Carbonate-Mag Hydroxide (ROLAIDS PO) Take 1 tablet by mouth as needed.    [provider]  cephALEXin (KEFLEX) 500 MG capsule Take 1 capsule (500 mg total) by mouth 4 (four) times daily for 10 days. 01/09/18 01/19/18  [name], DPM  ciprofloxacin (CIPRO) 500 MG tablet Take 1 tablet (500 mg total) by mouth 2 (two) times daily for 10 days. 01/09/18 01/19/18  [name], DPM  ibuprofen (ADVIL,MOTRIN) 200 MG tablet Take 400 mg by mouth every 6 (six) hours as needed for headache or moderate pain.    [provider]  medroxyPROGESTERone Acetate (DEPO-PROVERA IM) Inject into the muscle.    [provider]  oxyCODONE-acetaminophen (PERCOCET) 5-325 MG tablet Take 1-2 tablets by mouth every 6 (six) hours as needed for severe pain. 01/09/18 01/09/19  [name], DPM  VITAMIN E PO Take 1 capsule by mouth daily.    [provider]  cephALEXin (KEFLEX) 500 MG capsule Take 500 mg by mouth 3 (three) times daily.  01/09/18  [provider]  ciprofloxacin (CIPRO) 500 MG tablet Take 500 mg by mouth 2 (two) times daily.  01/09/18  [provider]  oxyCODONE-acetaminophen (PERCOCET) 5-325 MG tablet Take 1-2 tablets by mouth every 6 (six) hours as needed for severe pain. Max 6 pills per day. Patient not taking: Reported on 01/09/2018 09/05/17 01/09/18  [name], DPM   sulfamethoxazole-trimethoprim (BACTRIM DS,SEPTRA DS) 800-160 MG tablet Take 1 tablet by mouth 2 (two) times daily. Patient not taking: Reported on 01/09/2018 08/18/17 01/09/18  Paulette Blanch, MD  traMADol (ULTRAM) 50 MG tablet Take 1 tablet (50 mg total) by mouth every 6 (six) hours as needed. Patient not taking: Reported on 08/25/2017 08/18/17 01/09/18  Cuthriell, Charline Bills, PA-C    Charlett Nose ,PharmD Clinical Pharmacist  01/09/2018  10:59 AM

## 2018-01-09 NOTE — Progress Notes (Signed)
Dr. Vickki Muff spoke with Dr. Doy Mince from neurology and a code stroke was placed for pt. Carol Crawford .now. td

## 2018-01-09 NOTE — Consult Note (Signed)
Referring Physician: Fritzi Mandes    Chief Complaint: Left side weakness and numbness  HPI: Carol Crawford is an 42 y.o. female with pertinent history of left foot cellulitis and abscess status post incision and drainage (01/09/2018), bipolar disorder, depression, ADHD, migraine headaches, schizophrenia, presenting as a code stroke from PACU following lesion and drainage of the left foot.  PACU report, patient woke up local anesthesia with left-sided weakness, numbness and tingling sensation which was a new finding therefore code stroke was activated.  PACU RN patient had old numbness and weakness on the left side but it was worse after she woke up from surgery.  Last known well was 7:40am prior to surgery.  On exam she was alert but tearful, reports that she felt unusually weak and numb on the left side. she denies other associated symptoms dizziness, headache, speech difficulty or other focal neurologic deficit.  Patient states that she has had history of similar symptoms following surgery in June but states these symptoms were worse than what she experienced with her prior episode. Initial NIH stroke scale 7.  Patient was transported to [place] then ED.  Initial CT head was negative with no acute intracranial abnormality.  He was therefore admitted for further stroke work-up and evaluation.  Date last known well: Date: 01/09/2018 Time last known well: Time: 07:40 tPA Given: No: non disabling symptoms, not felt to be a stroke  Past Medical History:  Diagnosis Date  . ADHD   . Bipolar 1 disorder ([place])   . Depressed   . GERD (gastroesophageal reflux disease)    OCC  . Headache    MIGRAINES  . Schizophrenia [facility])     Past Surgical History:  Procedure Laterality Date  . INCISION AND DRAINAGE Left 01/09/2018   Procedure: INCISION AND DRAINAGE-below fascia foot; multiple;  Surgeon: [name], DPM;  Location: [facility] ORS;  Service: Podiatry;  Laterality: Left;  . NO PAST SURGERIES    .  REPAIR EXTENSOR TENDON Left 09/05/2017   Procedure: REPAIR EXTENSOR TENDON;  Surgeon: [name], DPM;  Location: [facility] ORS;  Service: Podiatry;  Laterality: Left;    Family History  Problem Relation Age of Onset  . Arthritis Mother   . Prostate cancer Father   . Diabetes Paternal Aunt      Social History:  reports that she has been smoking cigarettes. She has a 23.00 pack-year smoking history. She has never used smokeless tobacco. She reports that she drinks alcohol. She reports that she has current or past drug history. Drug: Marijuana.  Allergies:  Allergies  Allergen Reactions  . Penicillins Hives    Has patient had a PCN reaction causing immediate rash, facial/tongue/throat swelling, SOB or lightheadedness with hypotension: No Has patient had a PCN reaction causing severe rash involving mucus membranes or skin necrosis: Yes Has patient had a PCN reaction that required hospitalization: No Has patient had a PCN reaction occurring within the last 10 years: Yes If all of the above answers are "NO", then may proceed with Cephalosporin use.   . Shrimp [Shellfish Allergy] Hives  . Tomato Rash    Medications: I have reviewed the patient's current medications. Prior to Admission:  Prior to Admission medications   Medication Sig Start Date End Date Taking? Authorizing Provider  Ca Carbonate-Mag Hydroxide (ROLAIDS PO) Take 1 tablet by mouth as needed (heart burn).    Yes [provider]  cephALEXin (KEFLEX) 500 MG capsule Take 1 capsule (500 mg total) by mouth 4 (four) times  daily for 10 days. 01/09/18 01/19/18 Yes [name], DPM  ciprofloxacin (CIPRO) 500 MG tablet Take 1 tablet (500 mg total) by mouth 2 (two) times daily for 10 days. 01/09/18 01/19/18 Yes [name], DPM  ibuprofen (ADVIL,MOTRIN) 200 MG tablet Take 400 mg by mouth every 6 (six) hours as needed for headache or moderate pain.   Yes [provider]  medroxyPROGESTERone Acetate (DEPO-PROVERA IM)  Inject 1 Dose into the muscle every 3 (three) months.    Yes [provider]  oxyCODONE-acetaminophen (PERCOCET) 5-325 MG tablet Take 1-2 tablets by mouth every 6 (six) hours as needed for severe pain. 01/09/18 01/09/19 Yes [name], DPM  VITAMIN E PO Take 1 capsule by mouth daily.   Yes [provider]    ROS: History obtained from the patient   General ROS: negative for - chills, fatigue, fever, night sweats, weight gain or weight loss Psychological ROS: negative for - behavioral disorder, hallucinations, memory difficulties, suicidal ideation.  Positive for mood disorder Ophthalmic ROS: negative for - blurry vision, double vision, eye pain or loss of vision ENT ROS: negative for - epistaxis, nasal discharge, oral lesions, sore throat, tinnitus or vertigo Allergy and Immunology ROS: negative for - hives or itchy/watery eyes Hematological and Lymphatic ROS: negative for - bleeding problems, bruising or swollen lymph nodes Endocrine ROS: negative for - galactorrhea, hair pattern changes, polydipsia/polyuria or temperature intolerance Respiratory ROS: negative for - cough, hemoptysis, shortness of breath or wheezing Cardiovascular ROS: negative for - chest pain, dyspnea on exertion, edema or irregular heartbeat Gastrointestinal ROS: negative for - abdominal pain, diarrhea, hematemesis, nausea/vomiting or stool incontinence Genito-Urinary ROS: negative for - dysuria, hematuria, incontinence or urinary frequency/urgency Musculoskeletal ROS: negative for - joint swelling or muscular weakness Neurological ROS: as noted in HPI Dermatological ROS: negative for rash and skin lesion changes  Physical Examination: Blood pressure 132/78, pulse 89, temperature 98.2 F (36.8 C), temperature source Oral, resp. rate 14, height 5\' 7"  (1.702 m), weight 73.5 kg, SpO2 97 %.   HEENT-  Normocephalic, no lesions, without obvious abnormality.  Normal external eye and conjunctiva.  Normal  TM's bilaterally.  Normal auditory canals and external ears. Normal external nose, mucus membranes and septum.  Normal pharynx. Cardiovascular- S1, S2 normal, pulses palpable throughout   Lungs- chest clear, no wheezing, rales, normal symmetric air entry Abdomen- soft, non-tender; bowel sounds normal; no masses,  no organomegaly Extremities- no edema Lymph-no adenopathy palpable Musculoskeletal-no joint tenderness, deformity or swelling Skin-warm and dry, no hyperpigmentation, vitiligo, or suspicious lesions  Neurological Exam   Mental Status: Alert, oriented, thought content appropriate.  Speech fluent without evidence of aphasia.  Able to follow 3 step commands without difficulty. Attention span and concentration seemed appropriate  Cranial Nerves: II: Discs flat bilaterally; Visual fields grossly normal, pupils equal, round, reactive to light and accommodation III,IV, VI: ptosis not present, extra-ocular motions intact bilaterally V,VII: smile symmetric, facial light touch sensation  decreased on the left VIII: hearing normal bilaterally IX,X: gag reflex present XI: bilateral shoulder shrug XII: midline tongue extension Motor: Right :  Upper extremity   5/5 Without pronator drift      Left: Upper extremity   5/5 without pronator drift Right:   Lower extremity   5/5  Left: Lower extremity   5/5 Tone and bulk:normal tone throughout; no atrophy noted Sensory: Pinprick and light touch  decreased on the left Deep Tendon Reflexes: 2+ and symmetric throughout Plantars: Right: downgoing                            Left: downgoing Cerebellar: Finger-to-nose testing intact bilaterally. Heel to shin testing not tested on the left due to recent surgery Gait: not tested due to safety concerns  Data Reviewed  Laboratory Studies:  Basic Metabolic Panel: Recent Labs  Lab 01/09/18 1033  NA 139  K 3.9  CL 107  CO2 25  GLUCOSE 101*  BUN 8   CREATININE 0.78  CALCIUM 8.8*    Liver Function Tests: Recent Labs  Lab 01/09/18 1033  AST 25  ALT 20  ALKPHOS 64  BILITOT 0.5  PROT 6.8  ALBUMIN 3.9   No results for input(s): LIPASE, AMYLASE in the last 168 hours. No results for input(s): AMMONIA in the last 168 hours.  CBC: Recent Labs  Lab 01/09/18 1033  WBC 6.3  NEUTROABS 2.4  HGB 13.5  HCT 39.9  MCV 95.2  PLT 254    Cardiac Enzymes: Recent Labs  Lab 01/09/18 1033  TROPONINI <0.03    BNP: Invalid input(s): POCBNP  CBG: Recent Labs  Lab 01/09/18 0945  GLUCAP 95    Microbiology: Results for orders placed or performed during the hospital encounter of 01/09/18  Aerobic/Anaerobic Culture (surgical/deep wound)     Status: None (Preliminary result)   Collection Time: 01/09/18  7:49 AM  Result Value Ref Range Status   Specimen Description   Final    FOOT LEFT Performed at [facility], [address], [place], [place] [address]    Special Requests   Final    NONE Performed at [facility], [address], [place], [place] [address]    Gram Stain   Final    RARE WBC PRESENT,BOTH PMN AND MONONUCLEAR NO ORGANISMS SEEN Performed at [facility], [place] [address], [place], [place] [address]    Culture PENDING  Incomplete   Report Status PENDING  Incomplete    Coagulation Studies: Recent Labs    01/09/18 1033  LABPROT 13.1  INR 1.00    Urinalysis:  Recent Labs  Lab 01/09/18 1118  COLORURINE COLORLESS*  LABSPEC 1.003*  PHURINE 7.0  GLUCOSEU NEGATIVE  HGBUR SMALL*  BILIRUBINUR NEGATIVE  KETONESUR NEGATIVE  PROTEINUR NEGATIVE  NITRITE NEGATIVE  LEUKOCYTESUR NEGATIVE    Lipid Panel: No results found for: CHOL, TRIG, HDL, CHOLHDL, VLDL, LDLCALC  HgbA1C: No results found for: HGBA1C  Urine Drug Screen:      Component Value Date/Time   LABOPIA NONE DETECTED 01/09/2018 1118   COCAINSCRNUR NONE DETECTED 01/09/2018 1118   LABBENZ POSITIVE (A) 01/09/2018 1118    AMPHETMU NONE DETECTED 01/09/2018 1118   THCU NONE DETECTED 01/09/2018 1118   LABBARB NONE DETECTED 01/09/2018 1118    Alcohol Level:  Recent Labs  Lab 01/09/18 1033  ETH <10    Other results: EKG: normal EKG, normal sinus rhythm, unchanged from previous tracings. Vent. rate 67 BPM PR interval * ms QRS duration 93 ms QT/QTc 419/443 ms P-R-T axes 52 31 41  Imaging: Ct Head Code Stroke Wo Contrast`  Result Date: 01/09/2018 CLINICAL DATA:  Code stroke. 42 year old female with left side weakness, extremity numbness. EXAM: CT HEAD WITHOUT CONTRAST TECHNIQUE: Contiguous axial images were obtained  from the base of the skull through the vertex without intravenous contrast. COMPARISON:  Head CT without contrast 08/12/2010. FINDINGS: Brain: Normal cerebral volume. No midline shift, ventriculomegaly, mass effect, evidence of mass lesion, intracranial hemorrhage or evidence of cortically based acute infarction. Gray-white matter differentiation is within normal limits throughout the brain. Vascular: No suspicious intracranial vascular hyperdensity. Skull: Negative. Sinuses/Orbits: Visualized paranasal sinuses and mastoids are stable and well pneumatized. Other: Visualized orbit soft tissues are within normal limits. No acute scalp soft tissue findings. ASPECTS ([place] Stroke Program Early CT Score) - Ganglionic level infarction (caudate, lentiform nuclei, internal capsule, insula, M1-M3 cortex): 7 - Supraganglionic infarction (M4-M6 cortex): 3 Total score (0-10 with 10 being normal): 10 IMPRESSION: 1. Stable and normal noncontrast CT appearance of the brain. 2. ASPECTS is 10. 3. These results were communicated to Dr. Alexis Goodell at 10:08 amon 10/18/2019by text page via the [facility] messaging system. Electronically Signed   By: Genevie Ann M.D.   On: 01/09/2018 10:08    Patient seen and examined.  Clinical course and management discussed.  Necessary edits performed.  I agree with the above.  Assessment  and plan of care developed and discussed below.    Assessment: 42 y.o. female with pertinent history of left foot cellulitis and abscess status post incision and drainage (01/09/2018), bipolar disorder, depression, ADHD, migraine headaches, schizophrenia, presenting as a code stroke from PACU following lesion and drainage of the left foot with left side weakness, numbness, and tingling sensation.  She had a similar episode in June 2019 following anesthesia that did not completely resolve.  Now with worsening symptoms.  Neurological examination has multiple functional features.   Initial CT head reviewed and shows no acute intracranial abnormality.  Patient states she was not on antiplatelet or anticoagulant prior to this episode.  Stroke Risk Factors - smoking  Plan: 1. HgbA1c, fasting lipid panel  2. MRI, of the brain without contrast 3. PT consult, OT consult, Speech consult 4. Echocardiogram 5. Carotid dopplers 6. Prophylactic therapy-aspirin 81 mg/day 7. NPO until RN stroke swallow screen 8. Telemetry monitoring 9. Frequent neuro checks 10. Smoking cessation counseling  This patient was staffed with Dr. Magda Paganini, Doy Mince who personally evaluated patient, reviewed documentation and agreed with assessment and plan of care as above.  Rufina Falco, DNP, FNP-BC Board certified Nurse Practitioner Neurology Department  01/09/2018, 3:37 PM  Alexis Goodell, MD Neurology [phone]  01/09/2018  5:01 PM

## 2018-01-09 NOTE — Significant Event (Signed)
Rapid Response Event Note  Overview: Time Called: 0938 Arrival Time: 1324 Event Type: Neurologic  Initial Focused Assessment: Arrived in PACU with patient alert but tearful. Per PACU RN patient had old numbness and weakness on left side but it was worse after she woke up from surgery. Dr. Doy Mince already in PACU. Last known well was 07:40. Dr. Doy Mince performed initial NIH which was 6 with sensory deficit, right arm droop, left arm drop to bed, and left and right leg droop. BP 133/85, HR 65 NSR, RR 13, 98% oxygen saturation on room air.   Interventions: CBG 95.   Plan of Care (if not transferred): Patient transported to CT and then ED 7 per Dr. Doy Mince and [facility]. This RN and Jana Half RN Stroke Coordinator accompanied patient to CT. Reported off to [facility] in ED who had already received report from PACU RN.  Event Summary: Name of Physician Notified: Dr. Doy Mince at [phone]    at    Outcome: Transferred (Comment)(ED 7)  Event End Time: [address] [place]

## 2018-01-09 NOTE — H&P (Signed)
HISTORY AND PHYSICAL INTERVAL NOTE:  01/09/2018  7:16 AM  Carol Crawford  has presented today for surgery, with the diagnosis of Drain infection.  The various methods of treatment have been discussed with the patient.  No guarantees were given.  After consideration of risks, benefits and other options for treatment, the patient has consented to surgery.  I have reviewed the patients' chart and labs.     A history and physical examination was performed in my office.  The patient was reexamined.  There have been no changes to this history and physical examination.  Carol Crawford A

## 2018-01-09 NOTE — Anesthesia Procedure Notes (Signed)
Procedure Name: LMA Insertion Performed by: Rosalyn Archambault, CRNA Pre-anesthesia Checklist: Patient identified, Patient being monitored, Timeout performed, Emergency Drugs available and Suction available Patient Re-evaluated:Patient Re-evaluated prior to induction Oxygen Delivery Method: Circle system utilized Preoxygenation: Pre-oxygenation with 100% oxygen Induction Type: IV induction LMA: LMA inserted LMA Size: 3.5 Tube type: Oral Number of attempts: 1 Placement Confirmation: positive ETCO2 and breath sounds checked- equal and bilateral Tube secured with: Tape Dental Injury: Teeth and Oropharynx as per pre-operative assessment        

## 2018-01-09 NOTE — ED Provider Notes (Signed)
[facility] Emergency Department Provider Note  ____________________________________________  Time seen: Approximately 11:19 AM  I have reviewed the triage vital signs and the nursing notes.   HISTORY  Chief Complaint Weakness  Level 5 Caveat: Portions of the History and Physical including HPI and review of systems are unable to be completely obtained due to patient being a poor historian    HPI Carol Crawford is a 42 y.o. female with a history of ADHD bipolar schizophrenia and migraines who had incision and drainage of the left foot under anesthesia today.  When she woke up in the PACU, she complained of paresthesia of the whole left side of her body including face arm and leg.  She was seen by neurology and a code stroke was initiated, and stat CT head was obtained.  Patient was transferred to the ED for further evaluation during the initial phase of these acute symptoms.   Dr. Doy Mince notes that the patient does not have any pronounced neuro deficits that she identifies.  Past Medical History:  Diagnosis Date  . ADHD   . Bipolar 1 disorder ([place])   . Depressed   . GERD (gastroesophageal reflux disease)    OCC  . Headache    MIGRAINES  . Schizophrenia [facility])      Patient Active Problem List   Diagnosis Date Noted  . Depressed   . Bipolar 1 disorder [facility])      Past Surgical History:  Procedure Laterality Date  . NO PAST SURGERIES    . REPAIR EXTENSOR TENDON Left 09/05/2017   Procedure: REPAIR EXTENSOR TENDON;  Surgeon: [name], DPM;  Location: [facility] ORS;  Service: Podiatry;  Laterality: Left;     Prior to Admission medications   Medication Sig Start Date End Date Taking? Authorizing Provider  Ca Carbonate-Mag Hydroxide (ROLAIDS PO) Take 1 tablet by mouth as needed (heart burn).    Yes [provider]  cephALEXin (KEFLEX) 500 MG capsule Take 1 capsule (500 mg total) by mouth 4 (four) times daily for 10 days. 01/09/18 01/19/18 Yes  [name], DPM  ciprofloxacin (CIPRO) 500 MG tablet Take 1 tablet (500 mg total) by mouth 2 (two) times daily for 10 days. 01/09/18 01/19/18 Yes [name], DPM  ibuprofen (ADVIL,MOTRIN) 200 MG tablet Take 400 mg by mouth every 6 (six) hours as needed for headache or moderate pain.   Yes [provider]  medroxyPROGESTERone Acetate (DEPO-PROVERA IM) Inject 1 Dose into the muscle every 3 (three) months.    Yes [provider]  oxyCODONE-acetaminophen (PERCOCET) 5-325 MG tablet Take 1-2 tablets by mouth every 6 (six) hours as needed for severe pain. 01/09/18 01/09/19 Yes [name], DPM  VITAMIN E PO Take 1 capsule by mouth daily.   Yes [provider]     Allergies Penicillins; Shrimp [shellfish allergy]; and Tomato   History reviewed. No pertinent family history.  Social History Social History   Tobacco Use  . Smoking status: Current Every Day Smoker    Packs/day: 1.00    Years: 23.00    Pack years: 23.00    Types: Cigarettes    Last attempt to quit: 08/22/2017    Years since quitting: 0.3  . Smokeless tobacco: Never Used  Substance Use Topics  . Alcohol use: Yes    Comment: occasionally  . Drug use: Yes    Types: Marijuana    Comment: 30 DAYS    Review of Systems  Constitutional:   No fever or chills.  ENT:   No sore throat. No rhinorrhea. Cardiovascular:   No chest pain or syncope. Respiratory:   No dyspnea or cough. Gastrointestinal:   Negative for abdominal pain, vomiting and diarrhea.  Musculoskeletal:   Negative for focal pain or swelling All other systems reviewed and are negative except as documented above in ROS and HPI.  ____________________________________________   PHYSICAL EXAM:  VITAL SIGNS: ED Triage Vitals  Enc Vitals Group     BP 01/09/18 1015 (!) 127/99     Pulse Rate 01/09/18 1015 66     Resp 01/09/18 1015 17     Temp 01/09/18 1015 98.2 F (36.8 C)     Temp Source 01/09/18 1015 Oral     SpO2 01/09/18  1015 100 %     Weight 01/09/18 1017 162 lb 0.6 oz (73.5 kg)     Height 01/09/18 1017 5\' 7"  (1.702 m)     Head Circumference --      Peak Flow --      Pain Score 01/09/18 1016 7     Pain Loc --      Pain Edu? --      Excl. in [place]? --     Vital signs reviewed, nursing assessments reviewed.   Constitutional:   Alert and oriented. Non-toxic appearance. Eyes:   Conjunctivae are normal. EOMI. PERRL. ENT      Head:   Normocephalic and atraumatic.      Nose:   No congestion/rhinnorhea.       Mouth/Throat:   MMM, no pharyngeal erythema. No peritonsillar mass.       Neck:   No meningismus. Full ROM. Hematological/Lymphatic/Immunilogical:   No cervical lymphadenopathy. Cardiovascular:   RRR. Symmetric bilateral radial and DP pulses.  No murmurs. Cap refill less than 2 seconds. Respiratory:   Normal respiratory effort without tachypnea/retractions. Breath sounds are clear and equal bilaterally. No wheezes/rales/rhonchi. Gastrointestinal:   Soft and nontender. Non distended. There is no CVA tenderness.  No rebound, rigidity, or guarding.  Musculoskeletal:   Normal range of motion in all extremities. No joint effusions.  No lower extremity tenderness.  No edema. Neurologic:   Normal speech and language.  Motor grossly intact. Bilateral upper extremity drift Sensation in left lower extreme any untestable due to regional nerve block Patient reports altered sensation diffusely in left Stroke scale initially 7, questionably lateralizing. Skin:    Skin is warm, dry and intact. No rash noted.  No petechiae, purpura, or bullae.  ____________________________________________    LABS (pertinent positives/negatives) (all labs ordered are listed, but only abnormal results are displayed) Labs Reviewed  ETHANOL  PROTIME-INR  APTT  CBC  DIFFERENTIAL  COMPREHENSIVE METABOLIC PANEL  TROPONIN I  URINE DRUG SCREEN, QUALITATIVE ([facility] ONLY)  URINALYSIS, COMPLETE (UACMP) WITH MICROSCOPIC  HCG,  QUANTITATIVE, PREGNANCY  POC URINE PREG, ED   ____________________________________________   EKG  Interpreted by me Sinus rhythm rate of 67, normal axis and intervals.  Poor R wave progression.  Normal ST segments and T waves.  No acute ischemic changes.  ____________________________________________    RADIOLOGY  Ct Head Code Stroke Wo Contrast`  Result Date: 01/09/2018 CLINICAL DATA:  Code stroke. 42 year old female with left side weakness, extremity numbness. EXAM: CT HEAD WITHOUT CONTRAST TECHNIQUE: Contiguous axial images were obtained from the base of the skull through the vertex without intravenous contrast. COMPARISON:  Head CT without contrast 08/12/2010. FINDINGS: Brain: Normal cerebral volume. No midline shift, ventriculomegaly, mass effect, evidence of mass lesion, intracranial hemorrhage or evidence  of cortically based acute infarction. Gray-white matter differentiation is within normal limits throughout the brain. Vascular: No suspicious intracranial vascular hyperdensity. Skull: Negative. Sinuses/Orbits: Visualized paranasal sinuses and mastoids are stable and well pneumatized. Other: Visualized orbit soft tissues are within normal limits. No acute scalp soft tissue findings. ASPECTS ([place] Stroke Program Early CT Score) - Ganglionic level infarction (caudate, lentiform nuclei, internal capsule, insula, M1-M3 cortex): 7 - Supraganglionic infarction (M4-M6 cortex): 3 Total score (0-10 with 10 being normal): 10 IMPRESSION: 1. Stable and normal noncontrast CT appearance of the brain. 2. ASPECTS is 10. 3. These results were communicated to Dr. Alexis Goodell at 10:08 amon 10/18/2019by text page via the [facility] messaging system. Electronically Signed   By: Genevie Ann M.D.   On: 01/09/2018 10:08    ____________________________________________   PROCEDURES Procedures  ____________________________________________    CLINICAL IMPRESSION / ASSESSMENT AND PLAN / ED  COURSE  Pertinent labs & imaging results that were available during my care of the patient were reviewed by me and considered in my medical decision making (see chart for details).      Clinical Course as of Jan 09 1118  Fri Jan 09, 2018  1116 Patient seen immediately on arrival to the ED bed 7.  Accompanied by neurology Dr. Doy Mince who had seen the patient already and obtained a stat CT head.  CT negative, Dr. Doy Mince recommends hospitalization for further stroke work-up.  Labs unremarkable.  Will discuss with hospitalist   [PS]    Clinical Course User Index [PS] Carrie Mew, MD     ____________________________________________   FINAL CLINICAL IMPRESSION(S) / ED DIAGNOSES    Final diagnoses:  Paresthesia of left lower extremity  Paresthesia of left upper extremity     ED Discharge Orders    None      Portions of this note were generated with dragon dictation software. Dictation errors may occur despite best attempts at proofreading.    Carrie Mew, MD 01/09/18 1128

## 2018-01-09 NOTE — Progress Notes (Signed)
Pt awoke from OR complaining of left sided weakness and numbness to arm and leg.  Spoke to Dr. Doy Mince and recommended code stroke Neuro will come and evaluate.

## 2018-01-09 NOTE — Anesthesia Postprocedure Evaluation (Signed)
Anesthesia Post Note  Patient: Carol Crawford  Procedure(s) Performed: INCISION AND DRAINAGE-below fascia foot; multiple (Left Foot)  Patient location during evaluation: PACU Anesthesia Type: General Level of consciousness: awake and alert and oriented Pain management: pain level controlled Vital Signs Assessment: post-procedure vital signs reviewed and stable Respiratory status: spontaneous breathing, nonlabored ventilation and respiratory function stable Cardiovascular status: blood pressure returned to baseline and stable Postop Assessment: no signs of nausea or vomiting Anesthetic complications: no     Last Vitals:  Vitals:   01/09/18 0827 01/09/18 0842  BP: 110/74 106/71  Pulse: (!) 56 60  Resp: 15 12  Temp:    SpO2: 100% 100%    Last Pain:  Vitals:   01/09/18 0842  TempSrc:   PainSc: Asleep                 Esmeralda Malay

## 2018-01-09 NOTE — Op Note (Signed)
Operative note   Surgeon:Elsy Chiang Lawyer: None    Preop diagnosis: Abscess left foot    Postop diagnosis: Same    Procedure: I&D abscess left foot    EBL: Normal    Anesthesia:local and general.  Local consisted of a one-to-one mixture of 1% lidocaine with epinephrine and 0.5% bupivacaine    Hemostasis: Mid calf tourniquet inflated to 200 mmHg for 15 minutes    Specimen: Wound culture    Complications: None    Operative indications:Carol Crawford is an 42 y.o. that presents today for surgical intervention.  The risks/benefits/alternatives/complications have been discussed and consent has been given.    Procedure:  Patient was brought into the OR and placed on the operating table in thesupine position. After anesthesia was obtained theleft lower extremity was prepped and draped in usual sterile fashion.  Attention was directed to the medial aspect of the left foot where a noted open draining wound was found overlying the level of the navicular region.  Incision was made superficially down to the deeper fascial layer.  Blunt dissection was carried out around this small wound.  The incision was approximately 3 cm in length.  Further blunt dissection was taken across dorsally to the foot.  Previous incisions from an anterior tibial tendon repair were noted.  A small 2 cm incision was made on the anterior aspect of the ankle.  Sharp and blunt dissection carried down to the deeper layers.  At this time there was no purulent drainage from the dorsal wound or along the previous anterior tibial tendon repair site.  All purulence seem to be limited to the medial aspect of the foot.  The wound was flushed with 500 mL's of sterile saline.  The dorsal incision was closed with a 3-0 nylon.  The medial incision was closed proximal and distal with a 3-0 nylon and packed centrally with iodoform gauze.  A bulky sterile dressing was applied.    Patient tolerated the procedure and anesthesia  well.  Was transported from the OR to the PACU with all vital signs stable and vascular status intact. To be discharged per routine protocol.  Will follow up in approximately 1 week in the outpatient clinic.

## 2018-01-09 NOTE — Progress Notes (Signed)
   01/09/18 1048  Clinical Encounter Type  Visited With Patient;Family  Visit Type Initial;Code (Code Stroke)  Referral From Nurse  Consult/Referral To Chaplain  Recommendations Follow-up as needed.  Spiritual Encounters  Spiritual Needs Emotional;Prayer  Stress Factors  Patient Stress Factors Health changes (Fear and anxiety.)  Chaplain responded to Code Stroke at [phone] and met patient in [place] [facility]. Chaplain provided emotional support and prayer to help calm the patient. Still, patient displayed multiple teary outbursts.  Chaplain attended to the patient's fear and uncertainty about the Code Stroke process. Chaplain went with the patient to CT, then located the patient's brother Mahira Petras, and friends Roselle Locus and Otelia Limes. Chaplain escorted the family/friends to ED-07 and provided emotional support. Chaplain remained with the patient through the initial round of testing/results. Chaplain interfaced with nurses and doctors to relay the family's state of mind. Chaplain offered to return as needed. Further, Chaplain circled back to [facility] to touch base with nurse. Nurse expressed that she was okay.

## 2018-01-09 NOTE — Progress Notes (Signed)
Code stroke team arrived. Dr. Doy Mince evaluated pt. Pt then transported to CT and then was taken to ER to be evaluated. Report called to ER RN. Dr. Vickki Muff notified. Betrice Wanat E 10:11 AM 01/09/2018

## 2018-01-09 NOTE — H&P (Signed)
[place] [facility] [place] NAME: Carol Crawford    MR#:  [id number]  DATE OF BIRTH:  11-22-1975  DATE OF ADMISSION:  01/09/2018  PRIMARY CARE PHYSICIAN: [place]   REQUESTING/REFERRING PHYSICIAN: Dr. Joni Fears  CHIEF COMPLAINT:  left-sided numbness on and off since July  HISTORY OF PRESENT ILLNESS:  Carol Crawford  is a 42 y.o. female with a known history of tobacco abuse, ADHD, bipolar disorder, migraine headaches comes to the emergency room after she started noticing left facial upper extremity lower extremity numbness when she reported to the nurse in PACU after her left foot infectious surgery by podiatry Dr. Vickki Muff Dr. Vickki Muff spoke with Dr. Doy Mince. Code stroke was initiated. CT had negative for acute CVA. Patient was transferred to the ER and internal medicine/hospitalist was called for further management  Patient reports these numbness on and off since July. She has not spoken with her primary care physician regarding it. Her main risk factor is tobacco abuse and family history of stroke.  She received aspirin in the ER. She is being admitted for overnight observation  PAST MEDICAL HISTORY:   Past Medical History:  Diagnosis Date  . ADHD   . Bipolar 1 disorder ([place])   . Depressed   . GERD (gastroesophageal reflux disease)    OCC  . Headache    MIGRAINES  . Schizophrenia ([place])     PAST SURGICAL HISTOIRY:   Past Surgical History:  Procedure Laterality Date  . NO PAST SURGERIES    . REPAIR EXTENSOR TENDON Left 09/05/2017   Procedure: REPAIR EXTENSOR TENDON;  Surgeon: [name], DPM;  Location: [facility] ORS;  Service: Podiatry;  Laterality: Left;    SOCIAL HISTORY:   Social History   Tobacco Use  . Smoking status: Current Every Day Smoker    Packs/day: 1.00    Years: 23.00    Pack years: 23.00    Types: Cigarettes    Last attempt to quit: 08/22/2017    Years since quitting: 0.3  . Smokeless  tobacco: Never Used  Substance Use Topics  . Alcohol use: Yes    Comment: occasionally    FAMILY HISTORY:  History reviewed. No pertinent family history.  DRUG ALLERGIES:   Allergies  Allergen Reactions  . Penicillins Hives    Has patient had a PCN reaction causing immediate rash, facial/tongue/throat swelling, SOB or lightheadedness with hypotension: No Has patient had a PCN reaction causing severe rash involving mucus membranes or skin necrosis: Yes Has patient had a PCN reaction that required hospitalization: No Has patient had a PCN reaction occurring within the last 10 years: Yes If all of the above answers are "NO", then may proceed with Cephalosporin use.   . Shrimp [Shellfish Allergy] Hives  . Tomato Rash    REVIEW OF SYSTEMS:  Review of Systems  Constitutional: Negative for chills, fever and weight loss.  HENT: Negative for ear discharge, ear pain and nosebleeds.   Eyes: Negative for blurred vision, pain and discharge.  Respiratory: Negative for sputum production, shortness of breath, wheezing and stridor.   Cardiovascular: Negative for chest pain, palpitations, orthopnea and PND.  Gastrointestinal: Negative for abdominal pain, diarrhea, nausea and vomiting.  Genitourinary: Negative for frequency and urgency.  Musculoskeletal: Negative for back pain and joint pain.  Neurological: Positive for sensory change and weakness. Negative for speech change and focal weakness.  Psychiatric/Behavioral: Negative for depression and hallucinations. The patient is not nervous/anxious.  MEDICATIONS AT HOME:   Prior to Admission medications   Medication Sig Start Date End Date Taking? Authorizing Provider  Ca Carbonate-Mag Hydroxide (ROLAIDS PO) Take 1 tablet by mouth as needed (heart burn).    Yes [provider]  cephALEXin (KEFLEX) 500 MG capsule Take 1 capsule (500 mg total) by mouth 4 (four) times daily for 10 days. 01/09/18 01/19/18 Yes [name], DPM   ciprofloxacin (CIPRO) 500 MG tablet Take 1 tablet (500 mg total) by mouth 2 (two) times daily for 10 days. 01/09/18 01/19/18 Yes [name], DPM  ibuprofen (ADVIL,MOTRIN) 200 MG tablet Take 400 mg by mouth every 6 (six) hours as needed for headache or moderate pain.   Yes [provider]  medroxyPROGESTERone Acetate (DEPO-PROVERA IM) Inject 1 Dose into the muscle every 3 (three) months.    Yes [provider]  oxyCODONE-acetaminophen (PERCOCET) 5-325 MG tablet Take 1-2 tablets by mouth every 6 (six) hours as needed for severe pain. 01/09/18 01/09/19 Yes [name], DPM  VITAMIN E PO Take 1 capsule by mouth daily.   Yes [provider]      VITAL SIGNS:  Blood pressure 112/83, pulse 63, temperature 98.2 F (36.8 C), temperature source Oral, resp. rate 17, height 5\' 7"  (1.702 m), weight 73.5 kg, SpO2 100 %.  PHYSICAL EXAMINATION:  GENERAL:  42 y.o.-year-old patient lying in the bed with no acute distress.  EYES: Pupils equal, round, reactive to light and accommodation. No scleral icterus. Extraocular muscles intact.  HEENT: Head atraumatic, normocephalic. Oropharynx and nasopharynx clear.  NECK:  Supple, no jugular venous distention. No thyroid enlargement, no tenderness.  LUNGS: Normal breath sounds bilaterally, no wheezing, rales,rhonchi or crepitation. No use of accessory muscles of respiration.  CARDIOVASCULAR: S1, S2 normal. No murmurs, rubs, or gallops.  ABDOMEN: Soft, nontender, nondistended. Bowel sounds present. No organomegaly or mass.  EXTREMITIES: No pedal edema, cyanosis, or clubbing.  NEUROLOGIC: Cranial nerves II through XII are intact. Muscle strength 5/5 in all extremities. Sensation intact. Gait not checked. Subjective numbness left upper and lower extremity PSYCHIATRIC: The patient is alert and oriented x 3.  SKIN: No obvious rash, lesion, or ulcer.   LABORATORY PANEL:   CBC Recent Labs  Lab 01/09/18 1033  WBC 6.3  HGB 13.5  HCT  39.9  PLT 254   ------------------------------------------------------------------------------------------------------------------  Chemistries  Recent Labs  Lab 01/09/18 1033  NA 139  K 3.9  CL 107  CO2 25  GLUCOSE 101*  BUN 8  CREATININE 0.78  CALCIUM 8.8*  AST 25  ALT 20  ALKPHOS 64  BILITOT 0.5   ------------------------------------------------------------------------------------------------------------------  Cardiac Enzymes Recent Labs  Lab 01/09/18 1033  TROPONINI <0.03   ------------------------------------------------------------------------------------------------------------------  RADIOLOGY:  Ct Head Code Stroke Wo Contrast`  Result Date: 01/09/2018 CLINICAL DATA:  Code stroke. 42 year old female with left side weakness, extremity numbness. EXAM: CT HEAD WITHOUT CONTRAST TECHNIQUE: Contiguous axial images were obtained from the base of the skull through the vertex without intravenous contrast. COMPARISON:  Head CT without contrast 08/12/2010. FINDINGS: Brain: Normal cerebral volume. No midline shift, ventriculomegaly, mass effect, evidence of mass lesion, intracranial hemorrhage or evidence of cortically based acute infarction. Gray-white matter differentiation is within normal limits throughout the brain. Vascular: No suspicious intracranial vascular hyperdensity. Skull: Negative. Sinuses/Orbits: Visualized paranasal sinuses and mastoids are stable and well pneumatized. Other: Visualized orbit soft tissues are within normal limits. No acute scalp soft tissue findings. ASPECTS [facility] Stroke Program Early CT Score) - Ganglionic level infarction (caudate, lentiform  nuclei, internal capsule, insula, M1-M3 cortex): 7 - Supraganglionic infarction (M4-M6 cortex): 3 Total score (0-10 with 10 being normal): 10 IMPRESSION: 1. Stable and normal noncontrast CT appearance of the brain. 2. ASPECTS is 10. 3. These results were communicated to Dr. Alexis Goodell at 10:08 amon  10/18/2019by text page via the [facility] messaging system. Electronically Signed   By: Genevie Ann M.D.   On: 01/09/2018 10:08    EKG:    IMPRESSION AND PLAN:   Carol Crawford  is a 42 y.o. female with a known history of tobacco abuse, ADHD, bipolar disorder, migraine headaches comes to the emergency room after she started noticing left facial upper extremity lower extremity numbness when she reported to the nurse in PACU after her left foot infectious surgery by podiatry Dr. Vickki Muff  1. left upper and lower extremity numbness appears nonspecific since it has been going on since July however need to rule out stroke -CT head negative for CVA -mid for overnight observation -MRI of the brain, echo, ultrasound Doppler -check lipid profile -aspirin 81 mg daily -neurology consultation appreciated  2. left foot infection status post surgery -continue oral antibiotics which is Keflex and Cipro -Dr. Vickki Muff to see patient as outpatient if she gets discharged tomorrow  3. Bipolar/ADHD Stable  4. DVT prophylaxis subcu Lovenox  Above was discussed with patient and family in the ER they agreement with plan.  All the records are reviewed and case discussed with ED provider. Management plans discussed with the patient, family and they are in agreement.  CODE STATUS: full  TOTAL TIME TAKING CARE OF THIS PATIENT: *45* minutes.    Fritzi Mandes M.D on 01/09/2018 at 1:37 PM  Between 7am to 6pm - Pager - [phone]  After 6pm go to [email or web address] - password [email or web address] [facility]  [facility]  Office  [phone]  CC: Primary care physician; [place]

## 2018-01-09 NOTE — Plan of Care (Signed)
  Problem: Education: Goal: Knowledge of General Education information will improve Description: Including pain rating scale, medication(s)/side effects and non-pharmacologic comfort measures Outcome: Progressing   Problem: Education: Goal: Knowledge of disease or condition will improve Outcome: Progressing Goal: Knowledge of secondary prevention will improve Outcome: Progressing Goal: Knowledge of patient specific risk factors addressed and post discharge goals established will improve Outcome: Progressing   Problem: Coping: Goal: Will identify appropriate support needs Outcome: Progressing   Problem: Health Behavior/Discharge Planning: Goal: Ability to manage health-related needs will improve Outcome: Progressing   Problem: Self-Care: Goal: Ability to participate in self-care as condition permits will improve Outcome: Progressing Goal: Ability to communicate needs accurately will improve Outcome: Progressing   Problem: Nutrition: Goal: Risk of aspiration will decrease Outcome: Progressing   Problem: Ischemic Stroke/TIA Tissue Perfusion: Goal: Complications of ischemic stroke/TIA will be minimized Outcome: Progressing   

## 2018-01-09 NOTE — Anesthesia Post-op Follow-up Note (Signed)
Anesthesia QCDR form completed.        

## 2018-01-10 ENCOUNTER — Observation Stay
Admit: 2018-01-10 | Discharge: 2018-01-10 | Disposition: A | Discharge status: Home / Self Care | Payer: Medicaid Other | Attending: Internal Medicine | Admitting: Internal Medicine

## 2018-01-10 ENCOUNTER — Observation Stay: Payer: Medicaid Other

## 2018-01-10 DIAGNOSIS — R202 Paresthesia of skin: Secondary | ICD-10-CM

## 2018-01-10 LAB — ECHOCARDIOGRAM COMPLETE
Height: 67 in
Weight: 2592.61 oz

## 2018-01-10 MED ORDER — OXYCODONE-ACETAMINOPHEN 5-325 MG PO TABS: 1.0000 | ORAL_TABLET | Freq: Four times a day (QID) | ORAL | 0 refills | Status: DC | PRN

## 2018-01-10 MED ORDER — ASPIRIN 81 MG PO CHEW: 81.0000 mg | CHEWABLE_TABLET | Freq: Every day | ORAL | Status: DC

## 2018-01-10 NOTE — Progress Notes (Signed)
Pt being discharged home, discharge instructions and prescriptions reviewed with pt and family, states understanding, pt with no complaints at discharge 

## 2018-01-10 NOTE — Evaluation (Signed)
Occupational Therapy Evaluation Patient Details Name: Carol Crawford MRN: [id number] DOB: 1976-01-10 Today's Date: 01/10/2018    History of Present Illness 42 y/o female pt. S/P I&D of L foot abcess on 01/09/2018. After surgery pt woke up complaining of L sided weakness and numbness/tingling of L face, arm, and leg. MRI negative for acute infarct. PMHx includes GERD, bipolar 1 disorder, schizophrenia, ADHD, and migraines   Clinical Impression   Pt seen for OT evaluation this date. Pt is a 42 y/o female who presented with a I&D of L foot abcess. Pt. Also has L sided weakness and numbnes/tingling on left side after waking up from surgery. 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 Pt lives alone in a 2 story house with 3 steps to enter and a flight of steps to full bathroom. Pt will have  good support at home with family members 24/7. Currently, pt has impairments in pain, balance sensation on L side and strength requiring use of 2WW with CGA for ambulation. Pt has high pain levels in L foot requiring assistance with donning and doffing socks and shoes. Pt/brother educated in proper use of DME and falls prevention strategies. Pt/brother verbalized understanding of education provided this time. Pt will benefit from skilled OT services to address noted impairments and functional deficits in order to maximize safety and independence and minimize caregiver burden and falls risk. OT recommends HHOT upon discharge.      Follow Up Recommendations  Home health OT    Equipment Recommendations  3 in 1 bedside commode(2 WW)    Recommendations for Other Services       Precautions / Restrictions Restrictions Weight Bearing Restrictions: Yes LLE Weight Bearing: Non weight bearing      Mobility Bed Mobility Overal bed mobility: Modified Independent             General bed mobility comments: additional effort  required  Transfers Overall transfer level: Needs assistance Equipment used: Rolling walker (2 wheeled) Transfers: Sit to/from Stand Sit to Stand: Min guard         General transfer comment: Instruction with hand/foot placement to increase safety     Balance Overall balance assessment: Modified Independent                                         ADL either performed or assessed with clinical judgement   ADL Overall ADL's : Needs assistance/impaired Eating/Feeding: Independent;Sitting   Grooming: Independent;Sitting   Upper Body Bathing: Modified independent;Sitting Upper Body Bathing Details (indicate cue type and reason): using wipes Lower Body Bathing: Supervison/ safety;Sit to/from stand   Upper Body Dressing : Modified independent;Sitting   Lower Body Dressing: Minimal assistance Lower Body Dressing Details (indicate cue type and reason): PRN MIN A for donning/doffing socks and shoes Toilet Transfer: Regular Toilet;Grab bars;RW;Min guard;Ambulation   Toileting- Clothing Manipulation and Hygiene: Sit to/from stand;Min guard       Functional mobility during ADLs: Rolling walker;Min guard       Vision Baseline Vision/History: Wears glasses Wears Glasses: Reading only Patient Visual Report: No change from baseline       Perception     Praxis      Pertinent Vitals/Pain Pain Assessment: 0-10 Pain Score: 9  Pain Location: L foot and toes with mobility and touch.  Pain Descriptors / Indicators: Guarding;Grimacing;Moaning;Sharp Pain Intervention(s): Limited activity within patient's tolerance;Monitored during session;Repositioned;Patient requesting pain meds-RN notified;Utilized relaxation techniques     Hand Dominance     Extremity/Trunk Assessment Upper Extremity Assessment Upper Extremity Assessment: RUE deficits/detail;LUE deficits/detail RUE Deficits / Details: grossly WFL, 4/5 LUE Deficits / Details: 4-/5, impaired sensation  distally > proximally LUE Sensation: decreased light touch   Lower Extremity Assessment Lower Extremity Assessment: Defer to PT evaluation(LLE difficult to formally assess due to pain. )   Cervical / Trunk Assessment Cervical / Trunk Assessment: Normal   Communication Communication Communication: No difficulties   Cognition Arousal/Alertness: Awake/alert Behavior During Therapy: WFL for tasks assessed/performed Overall Cognitive Status: Within Functional Limits for tasks assessed                                     General Comments  Pt has pain in tips of toes on LLE    Exercises Other Exercises Other Exercises: Pt educated on techniques for proper DME use including hand/foot placement.  Other Exercises: Pt educated on falls prevention strategies. Other Exercises: Pt completed toilet transfer utilizing verbal cues for hand placement on grab bars, proper techniques for sit > stand transfer from toilet.   Shoulder Instructions      Home Living Family/patient expects to be discharged to:: Private residence Living Arrangements: Alone Available Help at Discharge: Family;Available 24 hours/day Type of Home: House Home Access: Stairs to enter [employer] of Steps: 3 Entrance Stairs-Rails: (both but can only reach one) Home Layout: Two level Alternate Level Stairs-Number of Steps: (one flight of steps inside home to reach full bathroom)   Bathroom Shower/Tub: None(sponge bath)   Bathroom Toilet: Standard     Home Equipment: Crutches          Prior Functioning/Environment Level of Independence: Independent        Comments: Pt independent with ADL aside from needing assistance with donnin/doffing socks and shoes. Pt used crutches for L foot pain and dysfunction.        OT Problem List: Decreased strength;Decreased activity tolerance;Decreased safety awareness;Impaired sensation;Pain;Decreased knowledge of use of DME or AE      OT  Treatment/Interventions: Self-care/ADL training;Therapeutic exercise;Patient/family education;DME and/or AE instruction    OT Goals(Current goals can be found in the care plan section) Acute Rehab OT Goals Patient Stated Goal: to get back home OT Goal Formulation: With patient/family Time For Goal Achievement: 01/24/18 Potential to Achieve Goals: Good ADL Goals Pt Will Perform Lower Body Dressing: with modified independence;sitting/lateral leans Additional ADL Goal #1: Pt will utilize 2-3 falls prevention strategies when completing ADL and IADL tasks.  OT Frequency: Min 1X/week   Barriers to D/C:            Co-evaluation PT/OT/SLP Co-Evaluation/Treatment: Yes Reason for Co-Treatment: For patient/therapist safety;To address functional/ADL transfers PT goals addressed during session: Mobility/safety with mobility;Proper use of DME;Balance OT goals addressed during session: ADL's and self-care;Proper use of Adaptive equipment and DME      AM-PAC PT "6 Clicks" Daily Activity     Outcome Measure Help from another person eating meals?: None Help from another person taking care of personal grooming?: None Help from another person toileting, which includes using toliet, bedpan, or urinal?: A Little Help from another person bathing (including washing, rinsing, drying)?: A Little Help from another person to put on and taking off regular upper body clothing?: None Help from another  person to put on and taking off regular lower body clothing?: A Little 6 Click Score: 21   End of Session Equipment Utilized During Treatment: Gait belt;Rolling walker Nurse Communication: Patient requests pain meds  Activity Tolerance: Patient tolerated treatment well Patient left: (Pt left EOB with PT)  OT Visit Diagnosis: Other abnormalities of gait and mobility (R26.89)                Time: 3174-0992 OT Time Calculation (min): 39 min Charges:     Jadene Pierini OTS  01/10/2018, 12:14 PM

## 2018-01-10 NOTE — Progress Notes (Addendum)
SLP Cancellation Note  Patient Details Name: Carol Crawford MRN: [id number] DOB: 1975/08/11   Cancelled treatment:       Reason Eval/Treat Not Completed: SLP screened, no needs identified, will sign off(chart reviewed; consulted NSG then met w/ pt in room). MRI revealed no acute infarct, or acute hemorrhage. Pt does have a baseline of Psychiatric dxs. Pt denied any difficulty swallowing and is currently on a regular diet; tolerates swallowing pills w/ water per NSG. She had eaten breakfast noting her empty tray. Pt conversed briefly at conversational level w/out deficits noted; pt denied any speech-language deficits stating "I just want to sleepy".  No further skilled ST services indicated as pt appears at her baseline. Pt agreed. NSG to reconsult if any change in status while admitted.     Orinda Kenner, MS, CCC-SLP Carol Crawford 01/10/2018, 10:43 AM

## 2018-01-10 NOTE — Progress Notes (Signed)
OT Cancellation Note  Patient Details Name: Carol Crawford MRN: [id number] DOB: 1976/03/11   Cancelled Treatment:    Reason Eval/Treat Not Completed: Patient declined, no reason specified. Order received, chart reviewed. Pt sleeping upon arrival. Pt politely requesting to continue sleeping and agreeable to OT coming back at later time. As OT exited pt's room, nursing entering with medication and informed pt that she will be going for a carotid ultrasound shortly. Will re-attempt OT evaluation at later date/time as pt is available and medically appropriate.   Jeni Salles, MPH, MS, OTR/L ascom [phone] 01/10/18, 9:33 AM

## 2018-01-10 NOTE — Progress Notes (Signed)
*  PRELIMINARY RESULTS* Echocardiogram 2D Echocardiogram has been performed.  Lavell Luster Aina Rossbach 01/10/2018, 11:09 AM

## 2018-01-10 NOTE — Progress Notes (Deleted)
PT Cancellation Note  Patient Details Name: Carol Crawford MRN: [id number] DOB: Nov 07, 1975   Cancelled Treatment:    Reason Eval/Treat Not Completed: Patient declined, no reason specified;Patient's level of consciousness.  Order received.  Chart reviewed.  Pt very lethargic and only answering "no" to PT.  Pt lying on side with head slightly elevated and no pillow underneath.  Pt refused pillow placement under head.  Will re-attempt when pt is more appropriate.   Roxanne Gates, PT, DPT 01/10/2018, 9:27 AM

## 2018-01-10 NOTE — Care Management Note (Signed)
Case Management Note  Patient Details  Name: Carol Crawford MRN: [id number] Date of Birth: Jul 15, 1975  Subjective/Objective:     Patient refuses home health. Does however need a rolling walker and bedside commode. Obtained from [place] with [place] care to bedside               Action/Plan:   Expected Discharge Date:  01/10/18               Expected Discharge Plan:  Home/Self Care  In-House Referral:     Discharge planning Services     Post Acute Care Choice:  Durable Medical Equipment Choice offered to:  Patient  DME Arranged:  3-N-1, Walker rolling DME Agency:  [place]:    [place]:     Status of Service:  Completed, signed off  If discussed at [employer] of Stay Meetings, dates discussed:    Additional Comments:  Latanya Maudlin, RN 01/10/2018, 1:43 PM

## 2018-01-10 NOTE — Evaluation (Signed)
Physical Therapy Evaluation Patient Details Name: Carol Crawford MRN: [id number] DOB: October 25, 1975 Today's Date: 01/10/2018   History of Present Illness  42 y/o female pt. S/P I&D of L foot abcess on 01/09/2018. After surgery pt woke up complaining of L sided weakness and numbness/tingling of L face, arm, and leg. MRI negative for acute infarct. PMHx includes GERD, bipolar 1 disorder, schizophrenia, ADHD, and migraines  Clinical Impression  Patient is a 42 year old female who lives home alone and is recently independent following extensor tendon repair in June 2019.  Evaluation performed alongside OT and student OT for 17 min.  Pt able to perform bed mobility mod I and sit at EOB without assistance.  PT and OT provided +2 assistance for STS, ambulation and toilet transfer for safety, though pt did demonstrate ability to stand with unilateral  R LE and control balance with use of RW and wall rail.  Pt did require several VC's for proper hand and foot placement for best body mechanics during transfers.  Pt demonstrated antalgic gait with very limited WB on L LE and good use of RW.  She did report fatigue in L UE following 20 ft of ambulation.  Pt presented with mildly decreased strength of L UE compared to R and reported sensation loss of L UE/LE.  PT significantly limited by pain and demonstrated apprehension with light touch to any aspect of L foot as well as reporting pain with hip flexion MMT.  Pt's R LE demonstrated good strength.  Pt will benefit from skilled PT with focus on strength, pain management, tolerance to activity and safe functional mobility.      Follow Up Recommendations Home health PT;Supervision for mobility/OOB    Equipment Recommendations  Rolling walker with 5" wheels(Bedside commode)    Recommendations for Other Services       Precautions / Restrictions Precautions Precautions: Fall Restrictions Weight Bearing Restrictions: Yes LLE Weight Bearing: Weight bearing as  tolerated      Mobility  Bed Mobility Overal bed mobility: Modified Independent             General bed mobility comments: additional effort required  Transfers Overall transfer level: Needs assistance Equipment used: Rolling walker (2 wheeled) Transfers: Sit to/from Stand Sit to Stand: Min guard         General transfer comment: VC's for placement of UE's LE's when standing from toilet but pt was able to stand on unilateral R LE and then steady herself with RW.  Ambulation/Gait Ambulation/Gait assistance: Min guard Gait Distance (Feet): 50 Feet Assistive device: Rolling walker (2 wheeled)     Gait velocity interpretation: <1.8 ft/sec, indicate of risk for recurrent falls General Gait Details: Slow antalgic gait with >75% of WB on R LE.  Pt able to manage RW though she did report fatigue in L LE following 20 ft of ambulation.  Stairs            Wheelchair Mobility    Modified Rankin (Stroke Patients Only)       Balance Overall balance assessment: Modified Independent                                           Pertinent Vitals/Pain Pain Assessment: 0-10 Pain Score: 9  Pain Location: L foot at surgical site; increased pain with light to moderate touch of L forefoot, dorsal aspect of toes and  with resistance during hip flexion MMT. Pain Descriptors / Indicators: Guarding;Grimacing Pain Intervention(s): Monitored during session;Patient requesting pain meds-RN notified    Home Living Family/patient expects to be discharged to:: Private residence Living Arrangements: Alone Available Help at Discharge: Family;Available 24 hours/day Type of Home: House Home Access: Stairs to enter Entrance Stairs-Rails: (both but can only reach one) Entrance Stairs-Number of Steps: 3 Home Layout: Two level Home Equipment: Crutches      Prior Function Level of Independence: Independent with assistive device(s)         Comments: Pt independent with  ADL aside from needing assistance with donning/doffing socks and shoes. Pt used crutches for L foot pain and dysfunction.     Hand Dominance   Dominant Hand: Right    Extremity/Trunk Assessment   Upper Extremity Assessment Upper Extremity Assessment: Generalized weakness RUE Deficits / Details: RUE: grossly 4/5 LUE Deficits / Details: LUE: 4-/5 LUE Sensation: decreased light touch    Lower Extremity Assessment Lower Extremity Assessment: Overall WFL for tasks assessed;LLE deficits/detail(RLE: Grossly 4/5) LLE Deficits / Details: Pt very apprehensive concerning PT touching L LE; stated that pain referred from foot up to knee and that even hip flexion testing is painful. LLE: Unable to fully assess due to pain LLE Sensation: decreased light touch    Cervical / Trunk Assessment Cervical / Trunk Assessment: Normal  Communication   Communication: No difficulties  Cognition Arousal/Alertness: Awake/alert Behavior During Therapy: WFL for tasks assessed/performed Overall Cognitive Status: Within Functional Limits for tasks assessed                                 General Comments: Follows commands consistently.  Eager to work with therapy and to improve mobility.      General Comments General comments (skin integrity, edema, etc.): Pt has pain in tips of toes on LLE    Exercises Other Exercises Other Exercises: Pt educated on techniques for proper DME use including hand/foot placement.  Other Exercises: Pt educated on falls prevention strategies. Other Exercises: Pt completed toilet transfer utilizing verbal cues for hand placement on grab bars, proper techniques for sit > stand transfer from toilet.   Assessment/Plan    PT Assessment Patient needs continued PT services  PT Problem List Decreased strength;Decreased mobility;Decreased balance;Decreased activity tolerance       PT Treatment Interventions DME instruction;Therapeutic activities;Gait  training;Therapeutic exercise;Patient/family education;Stair training;Balance training;Functional mobility training;Neuromuscular re-education    PT Goals (Current goals can be found in the Care Plan section)  Acute Rehab PT Goals Patient Stated Goal: To return to general daily activity without use of AD. PT Goal Formulation: With patient Time For Goal Achievement: 01/24/18 Potential to Achieve Goals: Good    Frequency Min 2X/week   Barriers to discharge        Co-evaluation   Reason for Co-Treatment: Other (comment) PT goals addressed during session: Mobility/safety with mobility OT goals addressed during session: Proper use of Adaptive equipment and DME;ADL's and self-care       AM-PAC PT "6 Clicks" Daily Activity  Outcome Measure Difficulty turning over in bed (including adjusting bedclothes, sheets and blankets)?: A Little Difficulty moving from lying on back to sitting on the side of the bed? : A Lot Difficulty sitting down on and standing up from a chair with arms (e.g., wheelchair, bedside commode, etc,.)?: A Lot Help needed moving to and from a bed to chair (including a wheelchair)?: A Lot  Help needed walking in hospital room?: A Lot Help needed climbing 3-5 steps with a railing? : A Lot 6 Click Score: 13    End of Session Equipment Utilized During Treatment: Gait belt Activity Tolerance: Patient tolerated treatment well;Patient limited by pain Patient left: with chair alarm set;with call bell/phone within reach;with family/visitor present;in chair Nurse Communication: Mobility status;Patient requests pain meds PT Visit Diagnosis: Unsteadiness on feet (R26.81);Muscle weakness (generalized) (M62.81);Pain Pain - Right/Left: Left Pain - part of body: Ankle and joints of foot;Leg;Knee    Time: 9810-2548 PT Time Calculation (min) (ACUTE ONLY): 20 min   Charges:   PT Evaluation $PT Eval Low Complexity: 1 Low          Roxanne Gates, PT, DPT  Roxanne Gates 01/10/2018, 1:13 PM

## 2018-01-10 NOTE — Progress Notes (Signed)
PT Cancellation Note  Patient Details Name: Carol Crawford MRN: [id number] DOB: 1975-07-26   Cancelled Treatment:    Reason Eval/Treat Not Completed: Patient at procedure or test/unavailable.  Order received.  Chart reviewed.  Pt currently with OT.  Will re-attempt later when pt is available.   Roxanne Gates, PT, DPT 01/10/2018, 9:29 AM

## 2018-01-10 NOTE — Discharge Summary (Signed)
Carol Crawford at [facility], [place] y.o., DOB 1976/03/12, MRN [id number]. Admission date: 01/09/2018 Discharge Date 01/10/2018 Primary MD [facility] Admitting Physician Fritzi Mandes, MD  Admission Diagnosis  TIA (transient ischemic attack) [G45.9] Paresthesia of left lower extremity [R20.2] Paresthesia of left upper extremity [R20.2]  Discharge Diagnosis   Active Problems: Left lower extremity and left upper extremity weakness due to TIA Left foot infection status post surgery Bipolar disorder ADHD    Hospital Course  Carol Crawford  is a 41 y.o. female with a known history of tobacco abuse, ADHD, bipolar disorder, migraine headaches comes to the emergency room after she started noticing left facial upper extremity lower extremity numbness when she reported to the nurse in PACU after her left foot infectious surgery .  She was seen by neurology and underwent work-up for acute stroke which included an MRI of the brain which was negative.  Carotid Doppler showed no significant stenosis echocardiogram was normal.  Patient was thought to have TIA.  With treated with aspirin. Her weakness is now resolved she is stable for discharge.          Consults  neurology  Significant Tests:  See full reports for all details    Mr Brain Wo Contrast  Result Date: 01/09/2018 CLINICAL DATA:  Left-sided paresthesias. EXAM: MRI HEAD WITHOUT CONTRAST TECHNIQUE: Multiplanar, multiecho pulse sequences of the brain and surrounding structures were obtained without intravenous contrast. COMPARISON:  Head CT 01/09/2018 FINDINGS: BRAIN: There is no acute infarct, acute hemorrhage, hydrocephalus or extra-axial collection. The midline structures are normal. No midline shift or other mass effect. There are no old infarcts. The white matter signal is normal for the patient's age. The cerebral and cerebellar volume are age-appropriate. Susceptibility-sensitive  sequences show no chronic microhemorrhage or superficial siderosis. VASCULAR: Major intracranial arterial and venous sinus flow voids are normal. SKULL AND UPPER CERVICAL SPINE: Calvarial bone marrow signal is normal. There is no skull base mass. Visualized upper cervical spine and soft tissues are normal. SINUSES/ORBITS: No fluid levels or advanced mucosal thickening. No mastoid or middle ear effusion. The orbits are normal. IMPRESSION: Normal MRI of the brain. Electronically Signed   By: Ulyses Jarred M.D.   On: 01/09/2018 16:51   US Carotid Bilateral  Result Date: 01/10/2018 CLINICAL DATA:  42 year old female with a history of TIA. EXAM: BILATERAL CAROTID DUPLEX ULTRASOUND TECHNIQUE: Pearline Cables scale imaging, color Doppler and duplex ultrasound were performed of bilateral carotid and vertebral arteries in the neck. COMPARISON:  None. FINDINGS: Criteria: Quantification of carotid stenosis is based on velocity parameters that correlate the residual internal carotid diameter with NASCET-based stenosis levels, using the diameter of the distal internal carotid lumen as the denominator for stenosis measurement. The following velocity measurements were obtained: RIGHT ICA:  Systolic 510 cm/sec, Diastolic 27 cm/sec CCA:  258 cm/sec SYSTOLIC ICA/CCA RATIO:  0.9 ECA:  124 cm/sec LEFT ICA:  Systolic 83 cm/sec, Diastolic 37 cm/sec CCA:  527 cm/sec SYSTOLIC ICA/CCA RATIO:  0.5 ECA:  126 cm/sec Right Brachial SBP: Not acquired Left Brachial SBP: Not acquired RIGHT CAROTID ARTERY: No significant calcifications of the right common carotid artery. Intermediate waveform maintained. Heterogeneous and partially calcified plaque at the right carotid bifurcation. No significant lumen shadowing. Low resistance waveform of the right ICA. No significant tortuosity. RIGHT VERTEBRAL ARTERY: Antegrade flow with low resistance waveform. LEFT CAROTID ARTERY: No significant calcifications of the left common carotid artery. Intermediate waveform  maintained. Heterogeneous and  partially calcified plaque at the left carotid bifurcation without significant lumen shadowing. Low resistance waveform of the left ICA. No significant tortuosity. LEFT VERTEBRAL ARTERY:  Antegrade flow with low resistance waveform. IMPRESSION: Color duplex indicates minimal heterogeneous and calcified plaque, with no hemodynamically significant stenosis by duplex criteria in the extracranial cerebrovascular circulation. Signed, Dulcy Fanny. Dellia Nims, RPVI [facility] Radiology Electronically Signed   By: Corrie Mckusick D.O.   On: 01/10/2018 12:19   Ct Head Code Stroke Wo Contrast`  Result Date: 01/09/2018 CLINICAL DATA:  Code stroke. 42 year old female with left side weakness, extremity numbness. EXAM: CT HEAD WITHOUT CONTRAST TECHNIQUE: Contiguous axial images were obtained from the base of the skull through the vertex without intravenous contrast. COMPARISON:  Head CT without contrast 08/12/2010. FINDINGS: Brain: Normal cerebral volume. No midline shift, ventriculomegaly, mass effect, evidence of mass lesion, intracranial hemorrhage or evidence of cortically based acute infarction. Gray-white matter differentiation is within normal limits throughout the brain. Vascular: No suspicious intracranial vascular hyperdensity. Skull: Negative. Sinuses/Orbits: Visualized paranasal sinuses and mastoids are stable and well pneumatized. Other: Visualized orbit soft tissues are within normal limits. No acute scalp soft tissue findings. ASPECTS ([place] Stroke Program Early CT Score) - Ganglionic level infarction (caudate, lentiform nuclei, internal capsule, insula, M1-M3 cortex): 7 - Supraganglionic infarction (M4-M6 cortex): 3 Total score (0-10 with 10 being normal): 10 IMPRESSION: 1. Stable and normal noncontrast CT appearance of the brain. 2. ASPECTS is 10. 3. These results were communicated to Dr. Alexis Goodell at 10:08 amon 10/18/2019by  text page via the [facility] messaging system. Electronically Signed   By: Genevie Ann M.D.   On: 01/09/2018 10:08       Today   Subjective:   Frankey Shown patient denying any complaint Objective:   Blood pressure 113/78, pulse 69, temperature 97.6 F (36.4 C), temperature source Oral, resp. rate 20, height 5\' 7"  (1.702 m), weight 73.5 kg, SpO2 100 %.  .  Intake/Output Summary (Last 24 hours) at 01/10/2018 1523 Last data filed at 01/10/2018 1146 Gross per 24 hour  Intake -  Output 1475 ml  Net -1475 ml    Exam VITAL SIGNS: Blood pressure 113/78, pulse 69, temperature 97.6 F (36.4 C), temperature source Oral, resp. rate 20, height 5\' 7"  (1.702 m), weight 73.5 kg, SpO2 100 %.  GENERAL:  42 y.o.-year-old patient lying in the bed with no acute distress.  EYES: Pupils equal, round, reactive to light and accommodation. No scleral icterus. Extraocular muscles intact.  HEENT: Head atraumatic, normocephalic. Oropharynx and nasopharynx clear.  NECK:  Supple, no jugular venous distention. No thyroid enlargement, no tenderness.  LUNGS: Normal breath sounds bilaterally, no wheezing, rales,rhonchi or crepitation. No use of accessory muscles of respiration.  CARDIOVASCULAR: S1, S2 normal. No murmurs, rubs, or gallops.  ABDOMEN: Soft, nontender, nondistended. Bowel sounds present. No organomegaly or mass.  EXTREMITIES: No pedal edema, cyanosis, or clubbing.  NEUROLOGIC: Cranial nerves II through XII are intact. Muscle strength 5/5 in all extremities. Sensation intact. Gait not checked.  PSYCHIATRIC: The patient is alert and oriented x 3.  SKIN: No obvious rash, lesion, or ulcer.   Data Review     CBC w Diff:  Lab Results  Component Value Date   WBC 6.3 01/09/2018   HGB 13.5 01/09/2018   HCT 39.9 01/09/2018   PLT 254 01/09/2018   LYMPHOPCT 56 01/09/2018   MONOPCT 4 01/09/2018   EOSPCT 1 01/09/2018   BASOPCT 0 01/09/2018   CMP:  Lab Results  Component Value Date   NA 139 01/09/2018    K 3.9 01/09/2018   CL 107 01/09/2018   CO2 25 01/09/2018   BUN 8 01/09/2018   CREATININE 0.78 01/09/2018   PROT 6.8 01/09/2018   ALBUMIN 3.9 01/09/2018   BILITOT 0.5 01/09/2018   ALKPHOS 64 01/09/2018   AST 25 01/09/2018   ALT 20 01/09/2018  .  Micro Results Recent Results (from the past 240 hour(s))  Aerobic/Anaerobic Culture (surgical/deep wound)     Status: None (Preliminary result)   Collection Time: 01/09/18  7:49 AM  Result Value Ref Range Status   Specimen Description   Final    FOOT LEFT Performed at [facility], [address], [place], [place] [address]    Special Requests   Final    NONE Performed at [facility], [address] [place], [place] [address]    Gram Stain   Final    RARE WBC PRESENT,BOTH PMN AND MONONUCLEAR NO ORGANISMS SEEN Performed at [facility], [place] [address] [place], [place] [address]    Culture FEW STAPHYLOCOCCUS AUREUS  Final   Report Status PENDING  Incomplete        Code Status Orders  (From admission, onward)         Start     Ordered   01/09/18 1541  Full code  Continuous     01/09/18 1540        Code Status History    Date Active Date Inactive Code Status Order ID Comments User Context   01/09/2018 0852 01/09/2018 1008 Full Code [id number]  [name], [facility]   09/05/2017 1105 09/05/2017 1438 Full Code [id number]  [name], [place]. Schedule an appointment as soon as possible for a visit in 6 days.   Contact information: [place] [address] [phone]        Schedule an appointment as soon as possible for a visit with [name], DPM.   Specialty:  Podiatry Why:  as scheduled in coming week Contact information: [place] [address] [phone]           Discharge Medications   Allergies as of 01/10/2018      Reactions   Penicillins Hives    Has patient had a PCN reaction causing immediate rash, facial/tongue/throat swelling, SOB or lightheadedness with hypotension: No Has patient had a PCN reaction causing severe rash involving mucus membranes or skin necrosis: Yes Has patient had a PCN reaction that required hospitalization: No Has patient had a PCN reaction occurring within the last 10 years: Yes If all of the above answers are "NO", then may proceed with Cephalosporin use.   Shrimp [shellfish Allergy] Hives   Tomato Rash      Medication List    TAKE these medications   aspirin 81 MG chewable tablet Chew 1 tablet (81 mg total) by mouth daily. Start taking on:  01/11/2018   cephALEXin 500 MG capsule Commonly known as:  KEFLEX Take 1 capsule (500 mg total) by mouth 4 (four) times daily for 10 days.   ciprofloxacin 500 MG tablet Commonly known as:  CIPRO Take 1 tablet (500 mg total) by mouth 2 (two) times daily for 10 days.   DEPO-PROVERA IM Inject 1 Dose into the muscle every 3 (three) months.   ibuprofen 200 MG tablet  Commonly known as:  ADVIL,MOTRIN Take 400 mg by mouth every 6 (six) hours as needed for headache or moderate pain.   oxyCODONE-acetaminophen 5-325 MG tablet Commonly known as:  PERCOCET/ROXICET Take 1-2 tablets by mouth every 6 (six) hours as needed for severe pain.   ROLAIDS PO Take 1 tablet by mouth as needed (heart burn).   VITAMIN E PO Take 1 capsule by mouth daily.            Durable Medical Equipment  (From admission, onward)         Start     Ordered   01/10/18 1245  For home use only DME Bedside commode  Once    Question:  Patient needs a bedside commode to treat with the following condition  Answer:  Foot infection   01/10/18 1244   01/10/18 1243  For home use only DME Walker  [facility])  Once    Question:  Patient needs a walker to treat with the following condition  Answer:  Foot infection   01/10/18 1243             Total Time in preparing paper work, data  evaluation and todays exam - 57 minutes  Dustin Flock M.D on 01/10/2018 at 3:23 PM [place]  [phone]

## 2018-01-10 NOTE — Progress Notes (Signed)
Pt has improved but states still has weakness on L side.   Past Medical History:  Diagnosis Date  . ADHD   . Bipolar 1 disorder ([place])   . Depressed   . GERD (gastroesophageal reflux disease)    OCC  . Headache    MIGRAINES  . Schizophrenia [facility])     Past Surgical History:  Procedure Laterality Date  . INCISION AND DRAINAGE Left 01/09/2018   Procedure: INCISION AND DRAINAGE-below fascia foot; multiple;  Surgeon: [name], DPM;  Location: [facility] ORS;  Service: Podiatry;  Laterality: Left;  . NO PAST SURGERIES    . REPAIR EXTENSOR TENDON Left 09/05/2017   Procedure: REPAIR EXTENSOR TENDON;  Surgeon: [name], DPM;  Location: [facility] ORS;  Service: Podiatry;  Laterality: Left;    Family History  Problem Relation Age of Onset  . Arthritis Mother   . Prostate cancer Father   . Diabetes Paternal Aunt      Social History:  reports that she has been smoking cigarettes. She has a 23.00 pack-year smoking history. She has never used smokeless tobacco. She reports that she drinks alcohol. She reports that she has current or past drug history. Drug: Marijuana.  Allergies:  Allergies  Allergen Reactions  . Penicillins Hives    Has patient had a PCN reaction causing immediate rash, facial/tongue/throat swelling, SOB or lightheadedness with hypotension: No Has patient had a PCN reaction causing severe rash involving mucus membranes or skin necrosis: Yes Has patient had a PCN reaction that required hospitalization: No Has patient had a PCN reaction occurring within the last 10 years: Yes If all of the above answers are "NO", then may proceed with Cephalosporin use.   . Shrimp [Shellfish Allergy] Hives  . Tomato Rash    Medications: I have reviewed the patient's current medications. Prior to Admission:  Prior to Admission medications   Medication Sig Start Date End Date Taking? Authorizing Provider  Ca Carbonate-Mag Hydroxide (ROLAIDS PO) Take 1 tablet by mouth as needed (heart  burn).    Yes [provider]  cephALEXin (KEFLEX) 500 MG capsule Take 1 capsule (500 mg total) by mouth 4 (four) times daily for 10 days. 01/09/18 01/19/18 Yes [name], DPM  ciprofloxacin (CIPRO) 500 MG tablet Take 1 tablet (500 mg total) by mouth 2 (two) times daily for 10 days. 01/09/18 01/19/18 Yes [name], DPM  ibuprofen (ADVIL,MOTRIN) 200 MG tablet Take 400 mg by mouth every 6 (six) hours as needed for headache or moderate pain.   Yes [provider]  medroxyPROGESTERone Acetate (DEPO-PROVERA IM) Inject 1 Dose into the muscle every 3 (three) months.    Yes [provider]  oxyCODONE-acetaminophen (PERCOCET) 5-325 MG tablet Take 1-2 tablets by mouth every 6 (six) hours as needed for severe pain. 01/09/18 01/09/19 Yes [name], DPM  VITAMIN E PO Take 1 capsule by mouth daily.   Yes [provider]    ROS: History obtained from the patient   General ROS: negative for - chills, fatigue, fever, night sweats, weight gain or weight loss Psychological ROS: negative for - behavioral disorder, hallucinations, memory difficulties, suicidal ideation.  Positive for mood disorder Ophthalmic ROS: negative for - blurry vision, double vision, eye pain or loss of vision ENT ROS: negative for - epistaxis, nasal discharge, oral lesions, sore throat, tinnitus or vertigo Allergy and Immunology ROS: negative for - hives or itchy/watery eyes Hematological and Lymphatic ROS: negative for - bleeding problems, bruising or swollen lymph nodes Endocrine ROS: negative  for - galactorrhea, hair pattern changes, polydipsia/polyuria or temperature intolerance Respiratory ROS: negative for - cough, hemoptysis, shortness of breath or wheezing Cardiovascular ROS: negative for - chest pain, dyspnea on exertion, edema or irregular heartbeat Gastrointestinal ROS: negative for - abdominal pain, diarrhea, hematemesis, nausea/vomiting or stool incontinence Genito-Urinary ROS:  negative for - dysuria, hematuria, incontinence or urinary frequency/urgency Musculoskeletal ROS: negative for - joint swelling or muscular weakness Neurological ROS: as noted in HPI Dermatological ROS: negative for rash and skin lesion changes  Physical Examination: Blood pressure 103/67, pulse (!) 55, temperature 98.3 F (36.8 C), temperature source Oral, resp. rate 16, height 5\' 7"  (1.702 m), weight 73.5 kg, SpO2 99 %.   HEENT-  Normocephalic, no lesions, without obvious abnormality.  Normal external eye and conjunctiva.  Normal TM's bilaterally.  Normal auditory canals and external ears. Normal external nose, mucus membranes and septum.  Normal pharynx. Cardiovascular- S1, S2 normal, pulses palpable throughout   Lungs- chest clear, no wheezing, rales, normal symmetric air entry Abdomen- soft, non-tender; bowel sounds normal; no masses,  no organomegaly Extremities- no edema Lymph-no adenopathy palpable Musculoskeletal-no joint tenderness, deformity or swelling Skin-warm and dry, no hyperpigmentation, vitiligo, or suspicious lesions  Neurological Exam   Mental Status: Alert, oriented, thought content appropriate.  Speech fluent without evidence of aphasia.  Able to follow 3 step commands without difficulty. Attention span and concentration seemed appropriate  Cranial Nerves: II: Discs flat bilaterally; Visual fields grossly normal, pupils equal, round, reactive to light and accommodation III,IV, VI: ptosis not present, extra-ocular motions intact bilaterally V,VII: smile symmetric, facial light touch sensation  decreased on the left VIII: hearing normal bilaterally IX,X: gag reflex present XI: bilateral shoulder shrug XII: midline tongue extension Motor: Right :  Upper extremity   5/5 Without pronator drift      Left: Upper extremity   5/5 without pronator drift Right:   Lower extremity   5/5                                          Left: Lower extremity   5/5 Tone and bulk:normal  tone throughout; no atrophy noted Sensory: Pinprick and light touch  decreased on the left Deep Tendon Reflexes: 2+ and symmetric throughout Plantars: Right: downgoing                            Left: downgoing Cerebellar: Finger-to-nose testing intact bilaterally. Heel to shin testing not tested on the left due to recent surgery Gait: not tested due to safety concerns  Data Reviewed  Laboratory Studies:  Basic Metabolic Panel: Recent Labs  Lab 01/09/18 1033  NA 139  K 3.9  CL 107  CO2 25  GLUCOSE 101*  BUN 8  CREATININE 0.78  CALCIUM 8.8*    Liver Function Tests: Recent Labs  Lab 01/09/18 1033  AST 25  ALT 20  ALKPHOS 64  BILITOT 0.5  PROT 6.8  ALBUMIN 3.9   No results for input(s): LIPASE, AMYLASE in the last 168 hours. No results for input(s): AMMONIA in the last 168 hours.  CBC: Recent Labs  Lab 01/09/18 1033  WBC 6.3  NEUTROABS 2.4  HGB 13.5  HCT 39.9  MCV 95.2  PLT 254    Cardiac Enzymes: Recent Labs  Lab 01/09/18 1033  TROPONINI <0.03    BNP: Invalid input(s): POCBNP  CBG: Recent Labs  Lab 01/09/18 0945  GLUCAP 95    Microbiology: Results for orders placed or performed during the hospital encounter of 01/09/18  Aerobic/Anaerobic Culture (surgical/deep wound)     Status: None (Preliminary result)   Collection Time: 01/09/18  7:49 AM  Result Value Ref Range Status   Specimen Description   Final    FOOT LEFT Performed at [facility], [address] [place], [place] [address]    Special Requests   Final    NONE Performed at [facility], [address] [place], [place] [address]    Gram Stain   Final    RARE WBC PRESENT,BOTH PMN AND MONONUCLEAR NO ORGANISMS SEEN Performed at [facility], [place] [address], [place], [place] [address]    Culture PENDING  Incomplete   Report Status PENDING  Incomplete    Coagulation Studies: Recent Labs    01/09/18 1033  LABPROT 13.1  INR 1.00    Urinalysis:   Recent Labs  Lab 01/09/18 1118  COLORURINE COLORLESS*  LABSPEC 1.003*  PHURINE 7.0  GLUCOSEU NEGATIVE  HGBUR SMALL*  BILIRUBINUR NEGATIVE  KETONESUR NEGATIVE  PROTEINUR NEGATIVE  NITRITE NEGATIVE  LEUKOCYTESUR NEGATIVE    Lipid Panel:    Component Value Date/Time   CHOL 180 01/09/2018 1850   TRIG 65 01/09/2018 1850   HDL 59 01/09/2018 1850   CHOLHDL 3.1 01/09/2018 1850   VLDL 13 01/09/2018 1850   LDLCALC 108 (H) 01/09/2018 1850    HgbA1C: No results found for: HGBA1C  Urine Drug Screen:      Component Value Date/Time   LABOPIA NONE DETECTED 01/09/2018 1118   COCAINSCRNUR NONE DETECTED 01/09/2018 1118   LABBENZ POSITIVE (A) 01/09/2018 1118   AMPHETMU NONE DETECTED 01/09/2018 1118   THCU NONE DETECTED 01/09/2018 1118   LABBARB NONE DETECTED 01/09/2018 1118    Alcohol Level:  Recent Labs  Lab 01/09/18 1033  ETH <10    Other results: EKG: normal EKG, normal sinus rhythm, unchanged from previous tracings. Vent. rate 67 BPM PR interval * ms QRS duration 93 ms QT/QTc 419/443 ms P-R-T axes 52 31 41  Imaging: Mr Brain Wo Contrast  Result Date: 01/09/2018 CLINICAL DATA:  Left-sided paresthesias. EXAM: MRI HEAD WITHOUT CONTRAST TECHNIQUE: Multiplanar, multiecho pulse sequences of the brain and surrounding structures were obtained without intravenous contrast. COMPARISON:  Head CT 01/09/2018 FINDINGS: BRAIN: There is no acute infarct, acute hemorrhage, hydrocephalus or extra-axial collection. The midline structures are normal. No midline shift or other mass effect. There are no old infarcts. The white matter signal is normal for the patient's age. The cerebral and cerebellar volume are age-appropriate. Susceptibility-sensitive sequences show no chronic microhemorrhage or superficial siderosis. VASCULAR: Major intracranial arterial and venous sinus flow voids are normal. SKULL AND UPPER CERVICAL SPINE: Calvarial bone marrow signal is normal. There is no skull base mass.  Visualized upper cervical spine and soft tissues are normal. SINUSES/ORBITS: No fluid levels or advanced mucosal thickening. No mastoid or middle ear effusion. The orbits are normal. IMPRESSION: Normal MRI of the brain. Electronically Signed   By: Ulyses Jarred M.D.   On: 01/09/2018 16:51   Ct Head Code Stroke Wo Contrast`  Result Date: 01/09/2018 CLINICAL DATA:  Code stroke. 42 year old female with left side weakness, extremity numbness. EXAM: CT HEAD WITHOUT CONTRAST TECHNIQUE: Contiguous axial images were obtained from the base of the skull through the vertex without intravenous contrast. COMPARISON:  Head CT without contrast 08/12/2010. FINDINGS: Brain: Normal  cerebral volume. No midline shift, ventriculomegaly, mass effect, evidence of mass lesion, intracranial hemorrhage or evidence of cortically based acute infarction. Gray-white matter differentiation is within normal limits throughout the brain. Vascular: No suspicious intracranial vascular hyperdensity. Skull: Negative. Sinuses/Orbits: Visualized paranasal sinuses and mastoids are stable and well pneumatized. Other: Visualized orbit soft tissues are within normal limits. No acute scalp soft tissue findings. ASPECTS ([place] Stroke Program Early CT Score) - Ganglionic level infarction (caudate, lentiform nuclei, internal capsule, insula, M1-M3 cortex): 7 - Supraganglionic infarction (M4-M6 cortex): 3 Total score (0-10 with 10 being normal): 10 IMPRESSION: 1. Stable and normal noncontrast CT appearance of the brain. 2. ASPECTS is 10. 3. These results were communicated to Dr. Alexis Goodell at 10:08 amon 10/18/2019by text page via the [facility] messaging system. Electronically Signed   By: Genevie Ann M.D.   On: 01/09/2018 10:08    Patient seen and examined.  Clinical course and management discussed.  Necessary edits performed.  I agree with the above.  Assessment and plan of care developed and discussed below.    Assessment: 42 y.o. female with pertinent  history of left foot cellulitis and abscess status post incision and drainage (01/09/2018), bipolar disorder, depression, ADHD, migraine headaches, schizophrenia, presenting as a code stroke from PACU following lesion and drainage of the left foot with left side weakness, numbness, and tingling sensation.  She had a similar episode in June 2019 following anesthesia that did not completely resolve.  Now with worsening symptoms.  Neurological examination has multiple functional features.   Initial CT head reviewed and shows no acute intracranial abnormality.  Patient states she was not on antiplatelet or anticoagulant prior to this episode.  - MRI no acute abnormalities - subjective exam and she states she is improving.  - give away weakness - no further imaging from neuro stand point 01/10/2018  9:23 AM

## 2018-01-13 LAB — HIV ANTIBODY (ROUTINE TESTING W REFLEX): HIV Screen 4th Generation wRfx: NONREACTIVE

## 2018-01-14 LAB — AEROBIC/ANAEROBIC CULTURE W GRAM STAIN (SURGICAL/DEEP WOUND)

## 2018-02-25 NOTE — Progress Notes (Signed)
Phoned patient about no show for mammogram on 09/09/17.  States she still has breast lump, but she has had 2 surgeries, and a stroke since her [facility] visit.  Still having left sided weakness, and numbness.  Scheduled diagnostic mammogram on 03/06/18 at 1:40 with Jamie in the [facility].   Patient to call if unable to keep appointment.

## 2018-03-06 ENCOUNTER — Other Ambulatory Visit: Payer: Medicaid Other

## 2018-03-06 ENCOUNTER — Inpatient Hospital Stay: Admission: RE | Admit: 2018-03-06 | Payer: Medicaid Other | Source: Ambulatory Visit

## 2018-03-27 ENCOUNTER — Ambulatory Visit
Admission: RE | Admit: 2018-03-27 | Discharge: 2018-03-27 | Disposition: A | Discharge status: Home / Self Care | Payer: Disability Insurance | Source: Ambulatory Visit | Attending: Dentistry | Admitting: Dentistry

## 2018-03-27 ENCOUNTER — Other Ambulatory Visit: Payer: Self-pay | Admitting: Dentistry

## 2018-03-27 DIAGNOSIS — M79672 Pain in left foot: Secondary | ICD-10-CM | POA: Insufficient documentation

## 2018-03-31 ENCOUNTER — Encounter: Payer: Self-pay | Admitting: Emergency Medicine

## 2018-03-31 ENCOUNTER — Emergency Department
Admission: EM | Admit: 2018-03-31 | Discharge: 2018-04-01 | Disposition: A | Discharge status: Home / Self Care | Payer: Medicaid Other | Attending: Emergency Medicine | Admitting: Emergency Medicine

## 2018-03-31 ENCOUNTER — Other Ambulatory Visit: Payer: Self-pay

## 2018-03-31 ENCOUNTER — Emergency Department: Payer: Medicaid Other

## 2018-03-31 DIAGNOSIS — F17210 Nicotine dependence, cigarettes, uncomplicated: Secondary | ICD-10-CM | POA: Insufficient documentation

## 2018-03-31 DIAGNOSIS — Z7982 Long term (current) use of aspirin: Secondary | ICD-10-CM | POA: Insufficient documentation

## 2018-03-31 DIAGNOSIS — L089 Local infection of the skin and subcutaneous tissue, unspecified: Secondary | ICD-10-CM | POA: Insufficient documentation

## 2018-03-31 DIAGNOSIS — Z79899 Other long term (current) drug therapy: Secondary | ICD-10-CM | POA: Insufficient documentation

## 2018-03-31 LAB — CBC WITH DIFFERENTIAL/PLATELET
Abs Immature Granulocytes: 0.02 10*3/uL (ref 0.00–0.07)
BASOS PCT: 0 %
Basophils Absolute: 0 10*3/uL (ref 0.0–0.1)
EOS ABS: 0.1 10*3/uL (ref 0.0–0.5)
Eosinophils Relative: 1 %
HCT: 41.7 % (ref 36.0–46.0)
Hemoglobin: 14.2 g/dL (ref 12.0–15.0)
Immature Granulocytes: 0 %
Lymphocytes Relative: 51 %
Lymphs Abs: 3.7 10*3/uL (ref 0.7–4.0)
MCH: 32.4 pg (ref 26.0–34.0)
MCHC: 34.1 g/dL (ref 30.0–36.0)
MCV: 95.2 fL (ref 80.0–100.0)
Monocytes Absolute: 0.3 10*3/uL (ref 0.1–1.0)
Monocytes Relative: 5 %
Neutro Abs: 3.1 10*3/uL (ref 1.7–7.7)
Neutrophils Relative %: 43 %
Platelets: 322 10*3/uL (ref 150–400)
RBC: 4.38 MIL/uL (ref 3.87–5.11)
RDW: 11.9 % (ref 11.5–15.5)
WBC: 7.2 10*3/uL (ref 4.0–10.5)
nRBC: 0 % (ref 0.0–0.2)

## 2018-03-31 LAB — LACTIC ACID, PLASMA: Lactic Acid, Venous: 1.2 mmol/L (ref 0.5–1.9)

## 2018-03-31 LAB — COMPREHENSIVE METABOLIC PANEL
ALT: 20 U/L (ref 0–44)
AST: 22 U/L (ref 15–41)
Albumin: 4.1 g/dL (ref 3.5–5.0)
Alkaline Phosphatase: 78 U/L (ref 38–126)
Anion gap: 6 (ref 5–15)
BUN: 9 mg/dL (ref 6–20)
CO2: 27 mmol/L (ref 22–32)
Calcium: 8.9 mg/dL (ref 8.9–10.3)
Chloride: 105 mmol/L (ref 98–111)
Creatinine, Ser: 1.09 mg/dL — ABNORMAL HIGH (ref 0.44–1.00)
GFR calc Af Amer: >60 mL/min (ref >60)
GFR calc non Af Amer: >60 mL/min (ref >60)
Glucose, Bld: 108 mg/dL — ABNORMAL HIGH (ref 70–99)
Potassium: 3.6 mmol/L (ref 3.5–5.1)
Sodium: 138 mmol/L (ref 135–145)
TOTAL PROTEIN: 7.3 g/dL (ref 6.5–8.1)
Total Bilirubin: 0.5 mg/dL (ref 0.3–1.2)

## 2018-03-31 MED ORDER — OXYCODONE-ACETAMINOPHEN 5-325 MG PO TABS: 1.0000 | ORAL_TABLET | Freq: Four times a day (QID) | ORAL | 0 refills | Status: DC | PRN

## 2018-03-31 NOTE — ED Provider Notes (Signed)
[facility] Emergency Department Provider Note  ____________________________________________   First MD Initiated Contact with Patient 03/31/18 2258     (approximate)  I have reviewed the triage vital signs and the nursing notes.   HISTORY  Chief Complaint Foot Pain and Wound Infection    HPI Carol Crawford is a 43 y.o. female with medical history as listed below which notably includes a prior injury with subsequent infection to the left foot who now presents with some swelling and purulent drainage from the left foot near the site of the prior infection.  In short, she sustained a traumatic injury to the foot about 7 months ago that required surgical repair in June or July 2019.  By October 2019 it was infected to the point that Dr. Vickki Muff took her to the operating room for incision and drainage.  She recovered from this but reports that for more than a week she has had swelling and pain in one particular spot on the top of her foot but that it seemed to move from the top of the foot to the inner aspect of the foot.  She saw Dr. Vickki Muff in clinic 1 week ago and he started her on Cipro and told her that he thought it was probably just a cyst but he wanted her to take antibiotics just to be sure.  She has been taking Cipro 500 mg twice daily for about 5 days now.  She has been soaking the foot in warm water and earlier today after she soaked it she wrapped it back up and felt a pop.  A large amount of green purulent drainage came out.  This scared her given her complicated history with the foot in the past.  She reports that the pain is moderate and worse with any amount of movement.  There is no redness or swelling that extends up beyond the foot.  There is still a small open wound that continues to leak a small amount of sometimes bloody fluid.    She denies fever/chills, chest pain, shortness of breath, nausea, vomiting, and abdominal pain.  Past Medical History:    Diagnosis Date  . ADHD   . Bipolar 1 disorder ([place])   . Depressed   . GERD (gastroesophageal reflux disease)    OCC  . Headache    MIGRAINES  . Schizophrenia [facility])     Patient Active Problem List   Diagnosis Date Noted  . Left sided numbness 01/09/2018  . Depressed   . Bipolar 1 disorder [facility])     Past Surgical History:  Procedure Laterality Date  . INCISION AND DRAINAGE Left 01/09/2018   Procedure: INCISION AND DRAINAGE-below fascia foot; multiple;  Surgeon: [name], DPM;  Location: [facility] ORS;  Service: Podiatry;  Laterality: Left;  . NO PAST SURGERIES    . REPAIR EXTENSOR TENDON Left 09/05/2017   Procedure: REPAIR EXTENSOR TENDON;  Surgeon: [name], DPM;  Location: [facility] ORS;  Service: Podiatry;  Laterality: Left;    Prior to Admission medications   Medication Sig Start Date End Date Taking? Authorizing Provider  aspirin 81 MG chewable tablet Chew 1 tablet (81 mg total) by mouth daily. 01/11/18   Dustin Flock, MD  Ca Carbonate-Mag Hydroxide (ROLAIDS PO) Take 1 tablet by mouth as needed (heart burn).     [provider]  ibuprofen (ADVIL,MOTRIN) 200 MG tablet Take 400 mg by mouth every 6 (six) hours as needed for headache or moderate pain.    [provider]  medroxyPROGESTERone Acetate (DEPO-PROVERA IM) Inject 1 Dose into the muscle every 3 (three) months.     [provider]  oxyCODONE-acetaminophen (PERCOCET) 5-325 MG tablet Take 1-2 tablets by mouth every 6 (six) hours as needed for severe pain. 03/31/18   Hinda Kehr, MD  VITAMIN E PO Take 1 capsule by mouth daily.    [provider]    Allergies Penicillins; Shrimp [shellfish allergy]; and Tomato  Family History  Problem Relation Age of Onset  . Arthritis Mother   . Prostate cancer Father   . Diabetes Paternal Aunt     Social History Social History   Tobacco Use  . Smoking status: Current Every Day Smoker    Packs/day: 1.00    Years: 23.00    Pack years:  23.00    Types: Cigarettes    Last attempt to quit: 08/22/2017    Years since quitting: 0.6  . Smokeless tobacco: Never Used  Substance Use Topics  . Alcohol use: Yes    Comment: occasionally  . Drug use: Yes    Types: Marijuana    Comment: 30 DAYS    Review of Systems Constitutional: No fever/chills Eyes: No visual changes. ENT: No sore throat. Cardiovascular: Denies chest pain. Respiratory: Denies shortness of breath. Gastrointestinal: No abdominal pain.  No nausea, no vomiting.  No diarrhea.  No constipation. Genitourinary: Negative for dysuria. Musculoskeletal: Pain, swelling, and drainage from left foot wound as described above.  Negative for neck pain.  Negative for back pain. Integumentary: Open wound on left foot as described above.  Negative for rash. Neurological: Negative for headaches, focal weakness or numbness.   ____________________________________________   PHYSICAL EXAM:  VITAL SIGNS: ED Triage Vitals  Enc Vitals Group     BP 03/31/18 2100 113/83     Pulse Rate 03/31/18 2100 91     Resp 03/31/18 2100 20     Temp 03/31/18 2100 98.7 F (37.1 C)     Temp Source 03/31/18 2100 Oral     SpO2 03/31/18 2100 100 %     Weight 03/31/18 2102 78 kg (172 lb)     Height 03/31/18 2102 1.702 m (5\' 7" )     Head Circumference --      Peak Flow --      Pain Score 03/31/18 2101 9     Pain Loc --      Pain Edu? --      Excl. in [place]? --     Constitutional: Alert and oriented. Well appearing and in no acute distress. Eyes: Conjunctivae are normal.  Head: Atraumatic. Nose: No congestion/rhinnorhea. Mouth/Throat: Mucous membranes are moist. Neck: No stridor.  No meningeal signs.   Cardiovascular: Normal rate, regular rhythm. Good peripheral circulation. Grossly normal heart sounds. Respiratory: Normal respiratory effort.  No retractions. Lungs CTAB. Gastrointestinal: Soft and nontender. No distention.  Musculoskeletal: The top of the patient's left foot has old  surgical scars.  There is mild swelling but no erythema to the top of the foot but more notable on the medial aspect of the midfoot.  There is a small, 1 to 2 mm draining wound with serosanguineous fluid minimally leaking from it.  The area is tender.  There is no edema or erythema that extends proximal to the wound and definitely does not extend proximal to the ankle. Neurologic:  Normal speech and language. No gross focal neurologic deficits are appreciated.  Skin:  Skin is warm, dry and intact except for foot wound as  described in musculoskeletal section above. Psychiatric: Mood and affect are normal. Speech and behavior are normal.  ____________________________________________   LABS (all labs ordered are listed, but only abnormal results are displayed)  Labs Reviewed  COMPREHENSIVE METABOLIC PANEL - Abnormal; Notable for the following components:      Result Value   Glucose, Bld 108 (*)    Creatinine, Ser 1.09 (*)    All other components within normal limits  LACTIC ACID, PLASMA  CBC WITH DIFFERENTIAL/PLATELET   ____________________________________________  EKG  None - EKG not ordered by ED physician ____________________________________________  RADIOLOGY Ursula Alert, personally viewed and evaluated these images (plain radiographs) as part of my medical decision making, as well as reviewing the written report by the radiologist.  ED MD interpretation: Dorsal soft tissue swelling, no soft tissue gas, or foreign body, or evidence of osteomyelitis.  Official radiology report(s): Dg Foot Complete Left  Result Date: 03/31/2018 CLINICAL DATA:  Pain and swelling EXAM: LEFT FOOT - COMPLETE 3+ VIEW COMPARISON:  03/27/2018 FINDINGS: No fracture or malalignment. Soft tissue swelling along the dorsal medial aspect of the mid to posterior foot. No soft tissue gas. No radiopaque foreign body. IMPRESSION: No acute osseous abnormality Electronically Signed   By: Donavan Foil M.D.   On:  03/31/2018 23:47    ____________________________________________   PROCEDURES  Critical Care performed: No   Procedure(s) performed:   Procedures   ____________________________________________   INITIAL IMPRESSION / ASSESSMENT AND PLAN / ED COURSE  As part of my medical decision making, I reviewed the following data within the [place] notes reviewed and incorporated, Labs reviewed , Old chart reviewed, Radiograph reviewed , Notes from prior ED visits and [place] Controlled Substance Database    Differential diagnosis includes, but is not limited to, infected cyst, abscess, retained foreign body from prior injury and surgery, osteomyelitis, necrotizing fasciitis.  The patient's foot is actually well-appearing given what she has been through in the past, and there is no evidence of infection that goes beyond the localized inflammation and phlegmon/abscess.  The patient's foot is very tender so attempts at expressing any additional fluid were unsuccessful which she showed me a photo on her phone that showed a great deal of purulent discharge.  Now there is only some serosanguineous fluid coming from the wound which I think is a good sign.  There is minimal swelling and no spreading cellulitis, the pain is not out of proportion to exam, there is no crepitus, and the radiographs are reassuring with no subcutaneous air and no evidence of osteomyelitis.  Her vital signs are normal and afebrile and she has reassuring lab work with a normal CMP, CBC with no leukocytosis, and a normal lactic acid.  I encouraged her to continue doing what she is doing by keeping the foot elevated when possible, soaking it in warm water, keeping it wrapped with clean gauze, and taking her Cipro.  I have sent a message through [facility] to Dr. Vickki Muff to update him about her ED visit and I encouraged her to reach out to his office first thing in the morning for follow-up appointment.  She understands  and agrees with the plan.  I checked the [place] controlled substance database and she has no recent narcotic prescriptions, in fact her last prescription was back in October when she had her last foot surgery.  I will give her a short course of Percocet and encourage close outpatient follow-up.  She understands and agrees with  the plan.  Clinical Course as of Apr 01 16  Tue Mar 31, 2018  2350 Small amount of swelling along the dorsal aspect of the foot but without any subcutaneous gas nor osteomyelitis  DG Foot Complete Left [CF]    Clinical Course User Index [CF] Hinda Kehr, MD    ____________________________________________  FINAL CLINICAL IMPRESSION(S) / ED DIAGNOSES  Final diagnoses:  Left foot infection     MEDICATIONS GIVEN DURING THIS VISIT:  Medications  oxyCODONE-acetaminophen (PERCOCET/ROXICET) 5-325 MG per tablet 2 tablet (2 tablets Oral Given 04/01/18 0014)     ED Discharge Orders         Ordered    oxyCODONE-acetaminophen (PERCOCET) 5-325 MG tablet  Every 6 hours PRN     03/31/18 2330           Note:  This document was prepared using Dragon voice recognition software and may include unintentional dictation errors.    Hinda Kehr, MD 04/01/18 [phone]

## 2018-03-31 NOTE — Discharge Instructions (Signed)
As we discussed, it appears that you have a cyst or other fluid collection in your foot that is draining, which is a good thing because it is getting rid of the infection.  Please continue to soak your foot in warm water a couple of times a day, keep it elevated when possible, and keep it wrapped with clean gauze.  Continue to take the antibiotics prescribed by Dr. Vickki Muff in addition to your regular medications.  Call Dr. Alvera Singh office first thing in the morning to schedule the next available follow-up appointment.  Return to the emergency department if you develop new or worsening symptoms that concern you.

## 2018-03-31 NOTE — ED Notes (Signed)
Pt to the ER for swelling to the left foot with drainage. Pt has a small 1cm opening to the left foot with bloody green drainage. Pt has pain and inflammation present. Pt ambulates with a walker.

## 2018-03-31 NOTE — ED Triage Notes (Addendum)
Patient to ER for c/o left foot pain. Patient states she has h/o foot surgery in June, developed infection with resulting I&D surgery. Patient states she has had "knot" on top of foot, felt a "pop" earlier today. Patient has picture on phone with dark green drainage on dressing. No warmth present to area at this time.  Patient currently taking Cipro (500mg  bid, started Wednesday) and IBU 800.

## 2018-04-01 MED ORDER — OXYCODONE-ACETAMINOPHEN 5-325 MG PO TABS
2.0000 | ORAL_TABLET | Freq: Once | ORAL | Status: AC
  Administered 2018-04-01: 2 via ORAL
  Filled 2018-04-01: qty 2

## 2018-04-09 ENCOUNTER — Ambulatory Visit
Admission: RE | Admit: 2018-04-09 | Discharge: 2018-04-09 | Disposition: A | Discharge status: Home / Self Care | Payer: Medicaid Other | Source: Ambulatory Visit | Attending: Oncology | Admitting: Oncology

## 2018-04-09 DIAGNOSIS — N6452 Nipple discharge: Secondary | ICD-10-CM | POA: Insufficient documentation

## 2018-04-09 DIAGNOSIS — N63 Unspecified lump in unspecified breast: Secondary | ICD-10-CM

## 2018-04-13 NOTE — Progress Notes (Addendum)
Patient returned for diagnostic mammogram and ultrasound.  Results Birads 3.  Scheduled 6 month follow-up and annual for 10/14/2018 at 12:30. Mailed appointment information to patient.  Copy to [facility].

## 2018-04-14 ENCOUNTER — Other Ambulatory Visit: Payer: Self-pay | Admitting: Podiatry

## 2018-04-16 ENCOUNTER — Encounter
Admission: RE | Admit: 2018-04-16 | Discharge: 2018-04-16 | Disposition: A | Discharge status: Home / Self Care | Payer: Medicaid Other | Source: Ambulatory Visit | Attending: Podiatry | Admitting: Podiatry

## 2018-04-16 ENCOUNTER — Other Ambulatory Visit: Payer: Self-pay

## 2018-04-16 HISTORY — DX: Cerebral infarction, unspecified: I63.9

## 2018-04-16 MED ORDER — CLINDAMYCIN PHOSPHATE 900 MG/50ML IV SOLN
900.0000 mg | INTRAVENOUS | Status: AC
  Administered 2018-04-17: 900 mg via INTRAVENOUS

## 2018-04-16 NOTE — Patient Instructions (Signed)
Your procedure is scheduled on:  Friday 04/17/18.  Report to DAY SURGERY DEPARTMENT LOCATED ON 2ND FLOOR MEDICAL MALL ENTRANCE. To find out your arrival time please call [phone] between 1PM - 3PM on  TODAY  Remember: Instructions that are not followed completely may result in serious medical risk, up to and including death, or upon the discretion of your surgeon and anesthesiologist your surgery may need to be rescheduled.      _X__ 1. Do not eat food after midnight the night before your procedure.                 No gum chewing or hard candies. You may drink clear liquids up to 2 hours                 before you are scheduled to arrive for your surgery- DO NOT drink clear                 liquids within 2 hours of the start of your surgery.                 Clear Liquids include:  water, apple juice without pulp, clear carbohydrate                 drink such as Clearfast or Gatorade, Black Coffee or Tea (Do not add                 anything to coffee or tea).  __X__2.  On the morning of surgery brush your teeth with toothpaste and water, you may rinse your mouth with mouthwash if you wish.  Do not swallow any toothpaste or mouthwash.     _X__ 3.  No Alcohol for 24 hours before or after surgery.   _X__ 4.  Do Not Smoke or use e-cigarettes For 24 Hours Prior to Your Surgery.                 Do not use any chewable tobacco products for at least 6 hours prior to                 surgery.  __X__5.  Notify your doctor if there is any change in your medical condition      (cold, fever, infections).     Do not wear jewelry, make-up, hairpins, clips or nail polish. Do not wear lotions, powders, or perfumes.  Do not shave 48 hours prior to surgery. Men may shave face and neck. Do not bring valuables to the hospital.    [facility] is not responsible for any belongings or valuables.   Patients discharged the day of surgery will not be allowed to drive home.   Please read over the  following fact sheets that you were given:   MRSA Information  __X__ Take these medicines the morning of surgery with A SIP OF WATER:     1. ciprofloxacin (CIPRO) 500 MG tablet  2. oxyCODONE-acetaminophen (PERCOCET) 5-325 MG tablet if needed  3.   4.  5.  6.     __X__ Stop Blood Thinners: Aspirin until after your procedure.   __X__ Stop Anti-inflammatories 7 days before surgery such as Advil, Ibuprofen, Motrin, BC or Goodies Powder, Naprosyn, Naproxen, Aleve, Aspirin, Meloxicam. May take Tylenol if needed for pain or discomfort.    __X__ Stop all herbal supplements until after surgery.

## 2018-04-17 ENCOUNTER — Encounter
Admission: RE | Disposition: A | Discharge status: Home / Self Care | Payer: Self-pay | Source: Home / Self Care | Attending: Podiatry

## 2018-04-17 ENCOUNTER — Ambulatory Visit
Admission: RE | Admit: 2018-04-17 | Discharge: 2018-04-17 | Disposition: A | Discharge status: Home / Self Care | Payer: Self-pay | Attending: Podiatry | Admitting: Podiatry

## 2018-04-17 ENCOUNTER — Encounter: Payer: Self-pay | Admitting: *Deleted

## 2018-04-17 ENCOUNTER — Ambulatory Visit: Payer: Self-pay | Admitting: Anesthesiology

## 2018-04-17 DIAGNOSIS — F209 Schizophrenia, unspecified: Secondary | ICD-10-CM | POA: Insufficient documentation

## 2018-04-17 DIAGNOSIS — Z880 Allergy status to penicillin: Secondary | ICD-10-CM | POA: Insufficient documentation

## 2018-04-17 DIAGNOSIS — R200 Anesthesia of skin: Secondary | ICD-10-CM | POA: Insufficient documentation

## 2018-04-17 DIAGNOSIS — L02612 Cutaneous abscess of left foot: Secondary | ICD-10-CM | POA: Insufficient documentation

## 2018-04-17 DIAGNOSIS — Z91013 Allergy to seafood: Secondary | ICD-10-CM | POA: Insufficient documentation

## 2018-04-17 DIAGNOSIS — Z1889 Other specified retained foreign body fragments: Secondary | ICD-10-CM | POA: Insufficient documentation

## 2018-04-17 DIAGNOSIS — Z7982 Long term (current) use of aspirin: Secondary | ICD-10-CM | POA: Insufficient documentation

## 2018-04-17 DIAGNOSIS — R202 Paresthesia of skin: Secondary | ICD-10-CM | POA: Insufficient documentation

## 2018-04-17 DIAGNOSIS — F17210 Nicotine dependence, cigarettes, uncomplicated: Secondary | ICD-10-CM | POA: Insufficient documentation

## 2018-04-17 DIAGNOSIS — Z8673 Personal history of transient ischemic attack (TIA), and cerebral infarction without residual deficits: Secondary | ICD-10-CM | POA: Insufficient documentation

## 2018-04-17 DIAGNOSIS — F319 Bipolar disorder, unspecified: Secondary | ICD-10-CM | POA: Insufficient documentation

## 2018-04-17 DIAGNOSIS — M60272 Foreign body granuloma of soft tissue, not elsewhere classified, left ankle and foot: Secondary | ICD-10-CM | POA: Insufficient documentation

## 2018-04-17 DIAGNOSIS — Z91018 Allergy to other foods: Secondary | ICD-10-CM | POA: Insufficient documentation

## 2018-04-17 DIAGNOSIS — I639 Cerebral infarction, unspecified: Secondary | ICD-10-CM

## 2018-04-17 HISTORY — PX: INCISION AND DRAINAGE: SHX5863

## 2018-04-17 LAB — URINE DRUG SCREEN, QUALITATIVE (ARMC ONLY)
Amphetamines, Ur Screen: NOT DETECTED
Barbiturates, Ur Screen: NOT DETECTED
Benzodiazepine, Ur Scrn: NOT DETECTED
Cannabinoid 50 Ng, Ur ~~LOC~~: POSITIVE — AB
Cocaine Metabolite,Ur ~~LOC~~: NOT DETECTED
MDMA (Ecstasy)Ur Screen: NOT DETECTED
Methadone Scn, Ur: NOT DETECTED
Opiate, Ur Screen: NOT DETECTED
PHENCYCLIDINE (PCP) UR S: NOT DETECTED
Tricyclic, Ur Screen: POSITIVE — AB

## 2018-04-17 LAB — POCT PREGNANCY, URINE: Preg Test, Ur: NEGATIVE

## 2018-04-17 SURGERY — INCISION AND DRAINAGE
Anesthesia: General | Site: Foot | Laterality: Left

## 2018-04-17 MED ORDER — CIPROFLOXACIN HCL 500 MG PO TABS: 500.0000 mg | ORAL_TABLET | Freq: Two times a day (BID) | ORAL | 0 refills | Status: AC

## 2018-04-17 MED ORDER — POVIDONE-IODINE 7.5 % EX SOLN
Freq: Once | CUTANEOUS | Status: AC
  Administered 2018-04-17: 08:00:00 via TOPICAL
  Filled 2018-04-17: qty 118

## 2018-04-17 MED ORDER — BUPIVACAINE HCL 0.5 % IJ SOLN
INTRAMUSCULAR | Status: DC | PRN
  Administered 2018-04-17 (×2): 10 mL

## 2018-04-17 MED ORDER — LACTATED RINGERS IV SOLN
INTRAVENOUS | Status: DC
  Administered 2018-04-17: 08:00:00 via INTRAVENOUS

## 2018-04-17 MED ORDER — HYDROMORPHONE HCL 1 MG/ML IJ SOLN
0.2500 mg | INTRAMUSCULAR | Status: DC | PRN
  Administered 2018-04-17 (×6): 0.25 mg via INTRAVENOUS

## 2018-04-17 MED ORDER — PHENYLEPHRINE HCL 10 MG/ML IJ SOLN
INTRAMUSCULAR | Status: DC | PRN
  Administered 2018-04-17 (×2): 100 ug via INTRAVENOUS

## 2018-04-17 MED ORDER — HYDROMORPHONE HCL 1 MG/ML IJ SOLN
INTRAMUSCULAR | Status: AC
  Administered 2018-04-17: 0.25 mg via INTRAVENOUS
  Filled 2018-04-17: qty 1

## 2018-04-17 MED ORDER — ONDANSETRON HCL 4 MG PO TABS: 4.0000 mg | ORAL_TABLET | Freq: Four times a day (QID) | ORAL | Status: DC | PRN

## 2018-04-17 MED ORDER — CLINDAMYCIN PHOSPHATE 900 MG/50ML IV SOLN
INTRAVENOUS | Status: AC
  Filled 2018-04-17: qty 50

## 2018-04-17 MED ORDER — BUPIVACAINE HCL (PF) 0.5 % IJ SOLN
INTRAMUSCULAR | Status: AC
  Filled 2018-04-17: qty 30

## 2018-04-17 MED ORDER — MIDAZOLAM HCL 2 MG/2ML IJ SOLN
INTRAMUSCULAR | Status: DC | PRN
  Administered 2018-04-17: 2 mg via INTRAVENOUS

## 2018-04-17 MED ORDER — ONDANSETRON HCL 4 MG/2ML IJ SOLN: 4.0000 mg | Freq: Four times a day (QID) | INTRAMUSCULAR | Status: DC | PRN

## 2018-04-17 MED ORDER — LIDOCAINE HCL (CARDIAC) PF 100 MG/5ML IV SOSY
PREFILLED_SYRINGE | INTRAVENOUS | Status: DC | PRN
  Administered 2018-04-17: 100 mg via INTRAVENOUS

## 2018-04-17 MED ORDER — LIDOCAINE HCL (PF) 1 % IJ SOLN
INTRAMUSCULAR | Status: AC
  Filled 2018-04-17: qty 30

## 2018-04-17 MED ORDER — MIDAZOLAM HCL 2 MG/2ML IJ SOLN
INTRAMUSCULAR | Status: AC
  Filled 2018-04-17: qty 2

## 2018-04-17 MED ORDER — FAMOTIDINE 20 MG PO TABS
20.0000 mg | ORAL_TABLET | Freq: Once | ORAL | Status: AC
  Administered 2018-04-17: 20 mg via ORAL

## 2018-04-17 MED ORDER — FENTANYL CITRATE (PF) 100 MCG/2ML IJ SOLN
INTRAMUSCULAR | Status: AC
  Filled 2018-04-17: qty 2

## 2018-04-17 MED ORDER — METHYLENE BLUE 0.5 % INJ SOLN
INTRAVENOUS | Status: AC
  Filled 2018-04-17: qty 10

## 2018-04-17 MED ORDER — CLINDAMYCIN HCL 300 MG PO CAPS: 300.0000 mg | ORAL_CAPSULE | Freq: Three times a day (TID) | ORAL | 0 refills | Status: AC

## 2018-04-17 MED ORDER — PROPOFOL 10 MG/ML IV BOLUS
INTRAVENOUS | Status: DC | PRN
  Administered 2018-04-17: 160 mg via INTRAVENOUS
  Administered 2018-04-17: 40 mg via INTRAVENOUS

## 2018-04-17 MED ORDER — FENTANYL CITRATE (PF) 100 MCG/2ML IJ SOLN
INTRAMUSCULAR | Status: DC | PRN
  Administered 2018-04-17: 100 ug via INTRAVENOUS

## 2018-04-17 MED ORDER — OXYCODONE-ACETAMINOPHEN 5-325 MG PO TABS: 1.0000 | ORAL_TABLET | Freq: Three times a day (TID) | ORAL | 0 refills | Status: DC | PRN

## 2018-04-17 MED ORDER — PROCHLORPERAZINE EDISYLATE 10 MG/2ML IJ SOLN
10.0000 mg | Freq: Once | INTRAMUSCULAR | Status: DC
  Filled 2018-04-17: qty 2

## 2018-04-17 MED ORDER — LIDOCAINE-EPINEPHRINE 1 %-1:100000 IJ SOLN
INTRAMUSCULAR | Status: DC | PRN
  Administered 2018-04-17: 10 mL

## 2018-04-17 MED ORDER — ROCURONIUM BROMIDE 50 MG/5ML IV SOLN
INTRAVENOUS | Status: AC
  Filled 2018-04-17: qty 1

## 2018-04-17 MED ORDER — KETOROLAC TROMETHAMINE 15 MG/ML IJ SOLN
15.0000 mg | Freq: Once | INTRAMUSCULAR | Status: DC
  Filled 2018-04-17: qty 1

## 2018-04-17 MED ORDER — DIPHENHYDRAMINE HCL 50 MG/ML IJ SOLN: 12.5000 mg | Freq: Once | INTRAMUSCULAR | Status: DC

## 2018-04-17 MED ORDER — LIDOCAINE HCL (PF) 2 % IJ SOLN
INTRAMUSCULAR | Status: AC
  Filled 2018-04-17: qty 10

## 2018-04-17 MED ORDER — PROPOFOL 10 MG/ML IV BOLUS
INTRAVENOUS | Status: AC
  Filled 2018-04-17: qty 20

## 2018-04-17 MED ORDER — LIDOCAINE-EPINEPHRINE 1 %-1:100000 IJ SOLN
INTRAMUSCULAR | Status: AC
  Filled 2018-04-17: qty 1

## 2018-04-17 MED ORDER — FAMOTIDINE 20 MG PO TABS
ORAL_TABLET | ORAL | Status: AC
  Filled 2018-04-17: qty 1

## 2018-04-17 SURGICAL SUPPLY — 64 items
BANDAGE ACE 4X5 VEL STRL LF (GAUZE/BANDAGES/DRESSINGS) ×2 IMPLANT
BLADE OSC/SAGITTAL MD 5.5X18 (BLADE) IMPLANT
BLADE OSCILLATING/SAGITTAL (BLADE)
BLADE SURG 15 STRL LF DISP TIS (BLADE) ×1 IMPLANT
BLADE SURG 15 STRL SS (BLADE) ×1
BLADE SW THK.38XMED LNG THN (BLADE) IMPLANT
BNDG COHESIVE 4X5 TAN STRL (GAUZE/BANDAGES/DRESSINGS) ×2 IMPLANT
BNDG COHESIVE 6X5 TAN STRL LF (GAUZE/BANDAGES/DRESSINGS) ×2 IMPLANT
BNDG CONFORM 2 STRL LF (GAUZE/BANDAGES/DRESSINGS) ×2 IMPLANT
BNDG CONFORM 3 STRL LF (GAUZE/BANDAGES/DRESSINGS) ×2 IMPLANT
BNDG ESMARK 4X12 TAN STRL LF (GAUZE/BANDAGES/DRESSINGS) ×2 IMPLANT
BNDG GAUZE 4.5X4.1 6PLY STRL (MISCELLANEOUS) ×2 IMPLANT
CANISTER SUCT 1200ML W/VALVE (MISCELLANEOUS) ×2 IMPLANT
CANISTER SUCT 3000ML PPV (MISCELLANEOUS) ×2 IMPLANT
COVER WAND RF STERILE (DRAPES) ×2 IMPLANT
CUFF TOURN 18 STER (MISCELLANEOUS) ×2 IMPLANT
CUFF TOURN DUAL PL 12 NO SLV (MISCELLANEOUS) ×2 IMPLANT
DRAPE FLUOR MINI C-ARM 54X84 (DRAPES) IMPLANT
DRAPE XRAY CASSETTE 23X24 (DRAPES) IMPLANT
DRESSING ALLEVYN 4X4 (MISCELLANEOUS) IMPLANT
DURAPREP 26ML APPLICATOR (WOUND CARE) ×2 IMPLANT
ELECT REM PT RETURN 9FT ADLT (ELECTROSURGICAL) ×2
ELECTRODE REM PT RTRN 9FT ADLT (ELECTROSURGICAL) ×1 IMPLANT
GAUZE PACKING 1/4 X5 YD (GAUZE/BANDAGES/DRESSINGS) ×2 IMPLANT
GAUZE PACKING IODOFORM 1X5 (MISCELLANEOUS) ×2 IMPLANT
GAUZE PETRO XEROFOAM 1X8 (MISCELLANEOUS) ×2 IMPLANT
GAUZE SPONGE 4X4 12PLY STRL (GAUZE/BANDAGES/DRESSINGS) ×2 IMPLANT
GLOVE BIO SURGEON STRL SZ7.5 (GLOVE) ×2 IMPLANT
GLOVE INDICATOR 8.0 STRL GRN (GLOVE) ×2 IMPLANT
GOWN STRL REUS W/ TWL LRG LVL3 (GOWN DISPOSABLE) ×2 IMPLANT
GOWN STRL REUS W/TWL LRG LVL3 (GOWN DISPOSABLE) ×2
GOWN STRL REUS W/TWL MED LVL3 (GOWN DISPOSABLE) ×2 IMPLANT
HANDPIECE VERSAJET DEBRIDEMENT (MISCELLANEOUS) IMPLANT
IV NS 1000ML (IV SOLUTION) ×1
IV NS 1000ML BAXH (IV SOLUTION) ×1 IMPLANT
KIT DRSG VAC SLVR GRANUFM (MISCELLANEOUS) ×2 IMPLANT
KIT TURNOVER KIT A (KITS) ×2 IMPLANT
LABEL OR SOLS (LABEL) ×2 IMPLANT
NEEDLE FILTER BLUNT 18X 1/2SAF (NEEDLE) ×1
NEEDLE FILTER BLUNT 18X1 1/2 (NEEDLE) ×1 IMPLANT
NEEDLE HYPO 25X1 1.5 SAFETY (NEEDLE) ×2 IMPLANT
NS IRRIG 500ML POUR BTL (IV SOLUTION) ×2 IMPLANT
PACK EXTREMITY ARMC (MISCELLANEOUS) ×2 IMPLANT
PAD ABD DERMACEA PRESS 5X9 (GAUZE/BANDAGES/DRESSINGS) ×2 IMPLANT
PENCIL ELECTRO HAND CTR (MISCELLANEOUS) ×2 IMPLANT
PULSAVAC PLUS IRRIG FAN TIP (DISPOSABLE) ×2
RASP SM TEAR CROSS CUT (RASP) IMPLANT
SHIELD FULL FACE ANTIFOG 7M (MISCELLANEOUS) ×2 IMPLANT
SOL .9 NS 3000ML IRR  AL (IV SOLUTION) ×1
SOL .9 NS 3000ML IRR UROMATIC (IV SOLUTION) ×1 IMPLANT
STOCKINETTE IMPERVIOUS 9X36 MD (GAUZE/BANDAGES/DRESSINGS) ×2 IMPLANT
SUT ETHILON 2 0 FS 18 (SUTURE) ×4 IMPLANT
SUT ETHILON 4-0 (SUTURE) ×1
SUT ETHILON 4-0 FS2 18XMFL BLK (SUTURE) ×1
SUT VIC AB 3-0 SH 27 (SUTURE) ×1
SUT VIC AB 3-0 SH 27X BRD (SUTURE) ×1 IMPLANT
SUT VIC AB 4-0 FS2 27 (SUTURE) ×2 IMPLANT
SUTURE ETHLN 4-0 FS2 18XMF BLK (SUTURE) ×1 IMPLANT
SWAB CULTURE AMIES ANAERIB BLU (MISCELLANEOUS) IMPLANT
SYR 10ML LL (SYRINGE) ×2 IMPLANT
SYR 3ML LL SCALE MARK (SYRINGE) ×2 IMPLANT
TIP FAN IRRIG PULSAVAC PLUS (DISPOSABLE) ×1 IMPLANT
TRAY PREP VAG/GEN (MISCELLANEOUS) ×2 IMPLANT
WND VAC CANISTER 500ML (MISCELLANEOUS) ×2 IMPLANT

## 2018-04-17 NOTE — Op Note (Signed)
Operative note   Surgeon:Eliud Polo Lawyer: None    Preop diagnosis: Abscess dorsal left foot    Postop diagnosis: Abscess with infected tendon dorsal left foot    Procedure: Incision and drainage with debridement of infected tendon dorsal left foot    EBL: Minimal    Anesthesia:general with local.  Local consisted of a one-to-one mixture of 1% lidocaine with epinephrine and 0.5% bupivacaine preoperatively.  20 cc was used.  Additional 10 cc of bupivacaine half percent was used at the end of the case.    Hemostasis: Mid calf tourniquet inflated 200 mmHg for 20 minutes    Specimen: Deep wound culture and infected tendon left foot.    Complications: None    Operative indications:Carol Crawford is an 43 y.o. that presents today for surgical intervention.  The risks/benefits/alternatives/complications have been discussed and consent has been given.    Procedure:  Patient was brought into the OR and placed on the operating table in thesupine position. After anesthesia was obtained theleft lower extremity was prepped and draped in usual sterile fashion.  Attention was directed to the anterior aspect of the left foot where the previous incision was noted.  The incision was placed through the previous anterior tibial tendon repair incision site.  Sharp and blunt dissection carried down to the superficial layers into the peritenon region.  There was noted to be a fair amount of thick scar tissue throughout the procedure.  Further dissection was carried medial and lateral.  Dissection was taken down to the area of the open draining abscess region.  Just deep to the abscessed area the tendon was found to have multiple Ethibond sutures.  This was then surgically excised and sent for pathological examination.  Further evaluation of the medial and lateral aspect of the incision site was undertaken.  No further areas of draining abscess regions were noted.  The wound was flushed with copious  amounts of irrigation.  Deep wound culture was performed.  Closure of the incision was then performed with a 4-0 nylon of the skin only.  This was to prevent any further residual infection from catching to deep suture material.  The medial abscess region was packed with plain packing.  A large bulky sterile dressing was applied to the left foot.    Patient tolerated the procedure and anesthesia well.  Was transported from the OR to the PACU with all vital signs stable and vascular status intact. To be discharged per routine protocol.  Will follow up in approximately 1 week in the outpatient clinic.

## 2018-04-17 NOTE — Progress Notes (Signed)
Pt stated she felt numbness and tingling in her lips and left side of face. She stated she felt this way last year after surgery in which she had a stroke. Neurology consult is ordered by Dr. Ola Spurr anesthesiologist.

## 2018-04-17 NOTE — Transfer of Care (Signed)
Immediate Anesthesia Transfer of Care Note  Patient: ROSALAND SHIFFMAN  Procedure(s) Performed: I&D; BELOW FASCIA FOOT; MULTIPLE (Left Foot)  Patient Location: PACU  Anesthesia Type:General  Level of Consciousness: sedated  Airway & Oxygen Therapy: Patient Spontanous Breathing and Patient connected to face mask oxygen  Post-op Assessment: Report given to RN and Post -op Vital signs reviewed and stable  Post vital signs: Reviewed and stable  Last Vitals:  Vitals Value Taken Time  BP 129/91 04/17/2018 10:11 AM  Temp    Pulse 65 04/17/2018 10:11 AM  Resp 19 04/17/2018 10:11 AM  SpO2 100 % 04/17/2018 10:11 AM  Vitals shown include unvalidated device data.  Last Pain:  Vitals:   04/17/18 0735  PainSc: 8          Complications: No apparent anesthesia complications

## 2018-04-17 NOTE — Anesthesia Preprocedure Evaluation (Addendum)
Anesthesia Evaluation  Patient identified by MRN, date of birth, ID band Patient awake    Reviewed: Allergy & Precautions, H&P , NPO status , Patient's Chart, lab work & pertinent test results  Airway Mallampati: III  TM Distance: >3 FB     Dental  (+) Chipped   Pulmonary Current Smoker,           Cardiovascular negative cardio ROS       Neuro/Psych  Headaches, PSYCHIATRIC DISORDERS Depression Bipolar Disorder Schizophrenia CVA    GI/Hepatic Neg liver ROS, GERD  ,  Endo/Other  negative endocrine ROS  Renal/GU      Musculoskeletal   Abdominal   Peds  Hematology negative hematology ROS (+)   Anesthesia Other Findings Past Medical History: No date: ADHD No date: Bipolar 1 disorder (HCC) No date: Depressed No date: GERD (gastroesophageal reflux disease)     Comment:  OCC No date: Headache     Comment:  MIGRAINES No date: Schizophrenia ([place]) No date: Stroke [facility])  Past Surgical History: 01/09/2018: INCISION AND DRAINAGE; Left     Comment:  Procedure: INCISION AND DRAINAGE-below fascia foot;               multiple;  Surgeon: [name], DPM;  Location: [facility]               ORS;  Service: Podiatry;  Laterality: Left; 09/05/2017: REPAIR EXTENSOR TENDON; Left     Comment:  Procedure: REPAIR EXTENSOR TENDON;  Surgeon: [name], DPM;  Location: [facility] ORS;  Service: Podiatry;                Laterality: Left;  BMI    Body Mass Index:  26.16 kg/m      Reproductive/Obstetrics negative OB ROS                            Anesthesia Physical Anesthesia Plan  ASA: III  Anesthesia Plan: General LMA   Post-op Pain Management:    Induction:   PONV Risk Score and Plan: Dexamethasone, Ondansetron, Midazolam and Treatment may vary due to age or medical condition  Airway Management Planned:   Additional Equipment:   Intra-op Plan:   Post-operative Plan:   Informed  Consent: I have reviewed the patients History and Physical, chart, labs and discussed the procedure including the risks, benefits and alternatives for the proposed anesthesia with the patient or authorized representative who has indicated his/her understanding and acceptance.     Dental Advisory Given  Plan Discussed with: Anesthesiologist and CRNA  Anesthesia Plan Comments:         Anesthesia Quick Evaluation

## 2018-04-17 NOTE — Anesthesia Post-op Follow-up Note (Signed)
Anesthesia QCDR form completed.        

## 2018-04-17 NOTE — Consult Note (Signed)
Referring Physician: Butch Penny MD    Chief Complaint: numbness and tingling in her lips and left side of face  HPI: FENDI MEINHARDT is an 43 y.o. female TANIS HENSARLING is an 43 y.o. female with pertinent history of left foot cellulitis and abscess status post incision and drainage (01/09/2018), bipolar disorder, depression, ADHD, migraine headaches, schizophrenia, presenting as a code stroke from PACU following incision and drainage with debridement of infected tendon dorsal left foot.  Per PACU nursing staff report, patient woke up from local anesthesia with complaints of numbness and tingling in her lips and left side of face. No reports of altered sensorium, speech abnormality, cranial nerve deficit, witnessed seizures like activity, vision disturbances, nausea or vomiting, headache or other associated focal neurological deficit. She however report left side weakness at baseline prior to this event. Patient has been evaluated with similar episode on 08/2017 and 01/09/2018 following incision and drainage with debridement of infected tendon dorsal left foot. Her symptoms resolved spontaneously without residual deficit. Initial NIH stroke scale 1.    Date last known well: Date: 04/17/2018 Time last known well: Time: 10:45 tPA Given: No: Symptoms not felt to be a stroke  Past Medical History:  Diagnosis Date  . ADHD   . Bipolar 1 disorder ([place])   . Depressed   . GERD (gastroesophageal reflux disease)    OCC  . Headache    MIGRAINES  . Schizophrenia ([place])   . Stroke [facility])     Past Surgical History:  Procedure Laterality Date  . INCISION AND DRAINAGE Left 01/09/2018   Procedure: INCISION AND DRAINAGE-below fascia foot; multiple;  Surgeon: [name], DPM;  Location: [facility] ORS;  Service: Podiatry;  Laterality: Left;  . REPAIR EXTENSOR TENDON Left 09/05/2017   Procedure: REPAIR EXTENSOR TENDON;  Surgeon: [name], DPM;  Location: [facility] ORS;  Service: Podiatry;  Laterality: Left;     Family History  Problem Relation Age of Onset  . Arthritis Mother   . Prostate cancer Father   . Diabetes Paternal Aunt   . Breast cancer Neg Hx    Social History:  reports that she has been smoking cigarettes. She has a 23.00 pack-year smoking history. She has never used smokeless tobacco. She reports current alcohol use. She reports current drug use. Drug: Marijuana.  Allergies:  Allergies  Allergen Reactions  . Penicillins Hives and Other (See Comments)    Has patient had a PCN reaction causing immediate rash, facial/tongue/throat swelling, SOB or lightheadedness with hypotension: No Has patient had a PCN reaction causing severe rash involving mucus membranes or skin necrosis: Yes Has patient had a PCN reaction that required hospitalization: No Has patient had a PCN reaction occurring within the last 10 years: Yes If all of the above answers are "NO", then may proceed with Cephalosporin use.   . Shrimp [Shellfish Allergy] Hives  . Tomato Rash    Medications:  I have reviewed the patient's current medications. Prior to Admission:  Medications Prior to Admission  Medication Sig Dispense Refill Last Dose  . aspirin 81 MG chewable tablet Chew 1 tablet (81 mg total) by mouth daily. (Patient taking differently: Chew 81 mg by mouth at bedtime as needed for moderate pain. )   04/15/2018  . ciprofloxacin (CIPRO) 500 MG tablet Take 500 mg by mouth 2 (two) times daily.   04/15/2018  . oxyCODONE-acetaminophen (PERCOCET) 5-325 MG tablet Take 1-2 tablets by mouth every 6 (six) hours as needed for severe pain. (Patient taking differently: Take  2 tablets by mouth every 4 (four) hours as needed for severe pain. ) 15 tablet 0 04/15/2018  . ibuprofen (ADVIL,MOTRIN) 600 MG tablet Take 600 mg by mouth every 6 (six) hours as needed.   04/15/2018   Scheduled: . diphenhydrAMINE  12.5 mg Intravenous Once  . famotidine      . ketorolac  15 mg Intravenous Once  . prochlorperazine  10 mg Intravenous  Once    ROS: History obtained from the patient   General ROS: negative for - chills, fatigue, fever, night sweats, weight gain or weight loss Psychological ROS: negative for - hallucinations, memory difficulties, mood swings or suicidal ideation. Positive for  behavioral disorder and mood disorder Ophthalmic ROS: negative for - blurry vision, double vision, eye pain or loss of vision ENT ROS: negative for - epistaxis, nasal discharge, oral lesions, sore throat, tinnitus or vertigo Allergy and Immunology ROS: negative for - hives or itchy/watery eyes Hematological and Lymphatic ROS: negative for - bleeding problems, bruising or swollen lymph nodes Endocrine ROS: negative for - galactorrhea, hair pattern changes, polydipsia/polyuria or temperature intolerance Respiratory ROS: negative for - cough, hemoptysis, shortness of breath or wheezing Cardiovascular ROS: negative for - chest pain, dyspnea on exertion, edema or irregular heartbeat Gastrointestinal ROS: negative for - abdominal pain, diarrhea, hematemesis, nausea/vomiting or stool incontinence Genito-Urinary ROS: negative for - dysuria, hematuria, incontinence or urinary frequency/urgency Musculoskeletal ROS: negative for - joint swelling. Positive for muscular weakness on the left extremities Neurological ROS: as noted in HPI Dermatological ROS: negative for rash and skin lesion changes  Physical Examination: Blood pressure 115/88, pulse (!) 56, temperature (!) 97.4 F (36.3 C), resp. rate 17, height 5\' 7"  (1.702 m), weight 75.8 kg, SpO2 100 %.   HEENT-  Normocephalic, no lesions, without obvious abnormality.  Normal external eye and conjunctiva.  Normal TM's bilaterally.  Normal auditory canals and external ears. Normal external nose, mucus membranes and septum.  Normal pharynx. Cardiovascular- S1, S2 normal, pulses palpable throughout   Lungs- chest clear, no wheezing, rales, normal symmetric air entry Abdomen- soft, non-tender;  bowel sounds normal; no masses,  no organomegaly Extremities- no edema Lymph-no adenopathy palpable Musculoskeletal-no joint tenderness, deformity or swelling Skin-warm and dry, no hyperpigmentation, vitiligo, or suspicious lesions  Neurological Exam   Mental Status: Alert, oriented, thought content appropriate.  Speech fluent without evidence of aphasia.  Able to follow 3 step commands without difficulty. Attention span and concentration seemed appropriate  Cranial Nerves: II: Discs flat bilaterally; Visual fields grossly normal, pupils equal, round, reactive to light and accommodation III,IV, VI: ptosis not present, extra-ocular motions intact bilaterally V,VII: smile symmetric, facial light touch sensation intact VIII: hearing normal bilaterally IX,X: gag reflex present XI: bilateral shoulder shrug XII: midline tongue extension Motor: Right :  Upper extremity   5/5 Without pronator drift      Left: Upper extremity   4+/5  Drift without pronation Right:   Lower extremity   5/5                                          Left: Lower extremity   3/5 Tone and bulk:normal tone throughout; no atrophy noted Sensory: Pinprick and light touch intact bilaterally Deep Tendon Reflexes: 2+ and symmetric throughout Plantars: Right: downgoing  Left: not tested Cerebellar: Finger-to-nose testing intact bilaterally. Unable to perform heel to shin testing due to recent surgery Gait: not tested due to safety concerns  Data Reviewed  Laboratory Studies:  Basic Metabolic Panel: No results for input(s): NA, K, CL, CO2, GLUCOSE, BUN, CREATININE, CALCIUM, MG, PHOS in the last 168 hours.  Liver Function Tests: No results for input(s): AST, ALT, ALKPHOS, BILITOT, PROT, ALBUMIN in the last 168 hours. No results for input(s): LIPASE, AMYLASE in the last 168 hours. No results for input(s): AMMONIA in the last 168 hours.  CBC: No results for input(s): WBC, NEUTROABS, HGB, HCT,  MCV, PLT in the last 168 hours.  Cardiac Enzymes: No results for input(s): CKTOTAL, CKMB, CKMBINDEX, TROPONINI in the last 168 hours.  BNP: Invalid input(s): POCBNP  CBG: No results for input(s): GLUCAP in the last 168 hours.  Microbiology: Results for orders placed or performed during the hospital encounter of 01/09/18  Aerobic/Anaerobic Culture (surgical/deep wound)     Status: None   Collection Time: 01/09/18  7:49 AM  Result Value Ref Range Status   Specimen Description   Final    FOOT LEFT Performed at [facility], [address] [place], [place] [address]    Special Requests   Final    NONE Performed at [facility], [address] [place], [place] [address]    Gram Stain   Final    RARE WBC PRESENT,BOTH PMN AND MONONUCLEAR NO ORGANISMS SEEN    Culture   Final    FEW STAPHYLOCOCCUS AUREUS NO ANAEROBES ISOLATED Performed at [facility], [place] [address] [place], [place] [address]    Report Status 01/14/2018 FINAL  Final   Organism ID, Bacteria STAPHYLOCOCCUS AUREUS  Final      Susceptibility   Staphylococcus aureus - MIC*    CIPROFLOXACIN <=0.5 SENSITIVE Sensitive     ERYTHROMYCIN >=8 RESISTANT Resistant     GENTAMICIN <=0.5 SENSITIVE Sensitive     OXACILLIN 1 SENSITIVE Sensitive     TETRACYCLINE >=16 RESISTANT Resistant     VANCOMYCIN <=0.5 SENSITIVE Sensitive     TRIMETH/SULFA >=320 RESISTANT Resistant     CLINDAMYCIN <=0.25 SENSITIVE Sensitive     RIFAMPIN <=0.5 SENSITIVE Sensitive     Inducible Clindamycin NEGATIVE Sensitive     * FEW STAPHYLOCOCCUS AUREUS    Coagulation Studies: No results for input(s): LABPROT, INR in the last 72 hours.  Urinalysis: No results for input(s): COLORURINE, LABSPEC, PHURINE, GLUCOSEU, HGBUR, BILIRUBINUR, KETONESUR, PROTEINUR, UROBILINOGEN, NITRITE, LEUKOCYTESUR in the last 168 hours.  Invalid input(s): APPERANCEUR  Lipid Panel:    Component Value Date/Time   CHOL 180 01/09/2018 1850    TRIG 65 01/09/2018 1850   HDL 59 01/09/2018 1850   CHOLHDL 3.1 01/09/2018 1850   VLDL 13 01/09/2018 1850   LDLCALC 108 (H) 01/09/2018 1850    HgbA1C: No results found for: HGBA1C  Urine Drug Screen:      Component Value Date/Time   LABOPIA NONE DETECTED 04/17/2018 0730   COCAINSCRNUR NONE DETECTED 04/17/2018 0730   LABBENZ NONE DETECTED 04/17/2018 0730   AMPHETMU NONE DETECTED 04/17/2018 0730   THCU POSITIVE (A) 04/17/2018 0730   LABBARB NONE DETECTED 04/17/2018 0730    Alcohol Level: No results for input(s): ETH in the last 168 hours.  Other results: EKG: there are no previous tracings available for comparison.  Imaging: No results found.  I have seen the patient and reviewed the above note of Rufina Falco.  She has  downward drift without pronation on the left and hold her face in a fashion that is not clear weakness, but I suspect is supposed to be.  It moves symmetrically once asked, she has no airleak no puffing out her cheeks.  Assessment/Plan: 43 y.o. female presenting as a code stroke from PACU following incision and drainage of the left foot with complaints of numbness and tingling in her lips and left side of face. Of note this is her 3rd episode following a procedure with most recent episode on 01/09/2018 that was worked up with MRI, carotids, with no acute findings.   There are some findings on her exam which could be suggestive of embellishment.  I suspect, however given the presence of positive symptoms (paresthesia) and the fact that she reports this happened just prior to her migraines from time to time but this represents complicated migraine.  We will treat for complicated migraine with causing, Benadryl, Toradol, if her symptoms persist beyond treatment, then could consider imaging but I suspect that this is relatively low yield.  Roland Rack, MD [facility] [phone]  If 7pm- 7am, please page neurology on call as listed in  [place].   04/17/2018, 11:48 AM

## 2018-04-17 NOTE — Discharge Instructions (Addendum)
[place]. [place]   1. Take your medication as prescribed.  Pain medication should be taken only as needed.  2. Keep the dressing clean, dry and intact.  I have instructed you to change her dressing this coming Sunday.  Remove all of the dressing including the packing.  Then apply a bulky sterile bandage with 4 x 4's and a wrap to the foot.  Can cover with the Ace wrap that is already on your foot.  3. Keep your foot elevated above the heart level for the first 48 hours.  4. Walking to the bathroom and brief periods of walking are acceptable, unless we have instructed you to be non-weight bearing.  5. Always wear your post-op shoe when walking.  Always use your crutches if you are to be non-weight bearing.  6. Do not take a shower. Baths are permissible as long as the foot is kept out of the water.   7. Every hour you are awake:  - Bend your knee 15 times. - Flex foot 15 times - Massage calf 15 times  8. Call [facility] [phone]) if any of the following problems occur: - You develop a temperature or fever. - The bandage becomes saturated with blood. - Medication does not stop your pain. - Injury of the foot occurs. - Any symptoms of infection including redness, odor, or red streaks running from wound.   AMBULATORY SURGERY  DISCHARGE INSTRUCTIONS   1) The drugs that you were given will stay in your system until tomorrow so for the next 24 hours you should not:  A) Drive an automobile B) Make any legal decisions C) Drink any alcoholic beverage   2) You may resume regular meals tomorrow.  Today it is better to start with liquids and gradually work up to solid foods.  You may eat anything you prefer, but it is better to start with liquids, then soup and crackers, and gradually work up to solid foods.   3) Please notify your doctor  immediately if you have any unusual bleeding, trouble breathing, redness and pain at the surgery site, drainage, fever, or pain not relieved by medication.    4) Additional Instructions:        Please contact your physician with any problems or Same Day Surgery at [phone], Monday through Friday 6 am to 4 pm, or [place] at [facility] number at [phone].

## 2018-04-17 NOTE — Progress Notes (Signed)
   04/17/18 1100  Clinical Encounter Type  Visited With Family;Health care provider  Visit Type Initial;Code  Referral From Palliative care team  Consult/Referral To Chaplain  Chaplain received a page for Code Stroke. Chaplain arrived and doctors was examining the patient and once finished, he cancelled the code stroke. Chaplain asked was everything ok, was she needed. Nurse said it was ok.

## 2018-04-17 NOTE — H&P (Signed)
HISTORY AND PHYSICAL INTERVAL NOTE:  04/17/2018  8:32 AM  Carol Crawford  has presented today for surgery, with the diagnosis of CELLULITIS AND ABSCESS OF LEFT LOWER EXTREMITY.  The various methods of treatment have been discussed with the patient.  No guarantees were given.  After consideration of risks, benefits and other options for treatment, the patient has consented to surgery.  I have reviewed the patients' chart and labs.     Crawford history and physical examination was performed in my office.  The patient was reexamined.  There have been no changes to this history and physical examination.  Carol Crawford

## 2018-04-17 NOTE — Progress Notes (Signed)
Dr. Leonel Ramsay arrived to PACU and assessed the patient. Upon his decision we decided to cancel code stroke on the patient.

## 2018-04-19 NOTE — Anesthesia Postprocedure Evaluation (Signed)
Anesthesia Post Note  Patient: Carol Crawford  Procedure(s) Performed: I&D; BELOW FASCIA FOOT; MULTIPLE (Left Foot)  Patient location during evaluation: PACU Anesthesia Type: General Level of consciousness: awake and alert Pain management: pain level controlled Vital Signs Assessment: post-procedure vital signs reviewed and stable Respiratory status: spontaneous breathing, nonlabored ventilation and respiratory function stable Cardiovascular status: blood pressure returned to baseline and stable Postop Assessment: no apparent nausea or vomiting Anesthetic complications: no     Last Vitals:  Vitals:   04/17/18 1208 04/17/18 1300  BP: 133/76 (!) 146/72  Pulse: 63   Resp: 18 18  Temp: 36.5 C   SpO2: 100% 100%    Last Pain:  Vitals:   04/17/18 1208  TempSrc: Temporal  PainSc: 0-No pain                 Durenda Hurt

## 2018-04-21 LAB — SURGICAL PATHOLOGY

## 2018-04-22 LAB — AEROBIC/ANAEROBIC CULTURE W GRAM STAIN (SURGICAL/DEEP WOUND): Gram Stain: NONE SEEN

## 2018-07-16 ENCOUNTER — Emergency Department
Admission: EM | Admit: 2018-07-16 | Discharge: 2018-07-16 | Disposition: A | Discharge status: Home / Self Care | Payer: Medicaid Other | Attending: Student in an Organized Health Care Education/Training Program | Admitting: Student in an Organized Health Care Education/Training Program

## 2018-07-16 ENCOUNTER — Encounter: Payer: Self-pay | Admitting: Emergency Medicine

## 2018-07-16 ENCOUNTER — Other Ambulatory Visit: Payer: Self-pay

## 2018-07-16 DIAGNOSIS — F17210 Nicotine dependence, cigarettes, uncomplicated: Secondary | ICD-10-CM | POA: Insufficient documentation

## 2018-07-16 DIAGNOSIS — T8131XA Disruption of external operation (surgical) wound, not elsewhere classified, initial encounter: Secondary | ICD-10-CM | POA: Insufficient documentation

## 2018-07-16 DIAGNOSIS — Z8673 Personal history of transient ischemic attack (TIA), and cerebral infarction without residual deficits: Secondary | ICD-10-CM | POA: Insufficient documentation

## 2018-07-16 DIAGNOSIS — Z7982 Long term (current) use of aspirin: Secondary | ICD-10-CM | POA: Insufficient documentation

## 2018-07-16 DIAGNOSIS — Y658 Other specified misadventures during surgical and medical care: Secondary | ICD-10-CM | POA: Insufficient documentation

## 2018-07-16 NOTE — Discharge Instructions (Addendum)
You have had local wound care provided for your foot wound. The wound does not appear to represent a deep abscess or cellulitis. Keep the wound clean, dry, and covered. Follow-up with Dr. Vickki Muff via phone for further wound care instructions. Return to the ED as needed.

## 2018-07-16 NOTE — ED Triage Notes (Addendum)
Pt in via POV, reports multiple surgeries to left foot with the most recent October 2019.  Pt reports pain, swelling and new drainage from most recent surgical site.  Abscess like area noted with minimal drainage.  Vitals WDL, NAD noted at this time.

## 2018-07-16 NOTE — ED Notes (Signed)
Pt wheeled to front lobby where her ride is. Pt cursing about lack of treatment that she received in ER. Pt states that she needed an xray and "I could have done what yall did at home". This RN asked if patient was able to express her concerns to EDP, pt stated that she did have that opportunity. Discussed follow up with podiatry, pt left with wound care supplies. Pt alert and oriented X4, active, cooperative, pt in NAD. RR even and unlabored, color WNL.  Pt informed to return if any life threatening symptoms occur.  Discharge and followup instructions reviewed.

## 2018-07-16 NOTE — ED Provider Notes (Signed)
[facility] Emergency Department Provider Note ____________________________________________  Time seen: 1640  I have reviewed the triage vital signs and the nursing notes.  HISTORY  Chief Complaint  Foot Pain and Abscess  HPI Carol Crawford is a 43 y.o. female presents herself to the ED for evaluation of left foot pain and wound check.  Patient had a significant injury to the left foot 2 years prior where she dropped a large mirror on her foot causing a severed anterior tibialis tendon and a laceration to her flexor hallux longus tendon.  She was treated surgically by Dr. Vickki Muff, and has been seeing him in the interim for wound care.  Most recently she developed an infection abscess to the foot, and underwent I&D procedure.  She was scheduled to see Dr. Vickki Muff today for follow-up, but decided to cancel the appointment and come to the ED instead, because she did not have the required copayment at the time.  She denies any interim fevers, chills, or sweats.  She describes a shallow wound to the medial aspect of the left foot has had some clear yellow drainage.  She denies any abnormal edema to the foot, bruising, swelling, ecchymosis, or abscess formation.  She notes a firm swollen area to the dorsum of the foot, but notes that it has been there before and comes and goes.  She is here for further evaluation.  Past Medical History:  Diagnosis Date  . ADHD   . Bipolar 1 disorder ([place])   . Depressed   . GERD (gastroesophageal reflux disease)    OCC  . Headache    MIGRAINES  . Schizophrenia ([place])   . Stroke [facility])     Patient Active Problem List   Diagnosis Date Noted  . Acute ischemic stroke ([place])   . Left sided numbness 01/09/2018  . Depressed   . Bipolar 1 disorder [facility])     Past Surgical History:  Procedure Laterality Date  . INCISION AND DRAINAGE Left 01/09/2018   Procedure: INCISION AND DRAINAGE-below fascia foot; multiple;  Surgeon: [name], DPM;   Location: [facility] ORS;  Service: Podiatry;  Laterality: Left;  . INCISION AND DRAINAGE Left 04/17/2018   Procedure: I&D; BELOW FASCIA FOOT; MULTIPLE;  Surgeon: [name], DPM;  Location: [facility] ORS;  Service: Podiatry;  Laterality: Left;  . REPAIR EXTENSOR TENDON Left 09/05/2017   Procedure: REPAIR EXTENSOR TENDON;  Surgeon: [name], DPM;  Location: [facility] ORS;  Service: Podiatry;  Laterality: Left;    Prior to Admission medications   Medication Sig Start Date End Date Taking? Authorizing Provider  aspirin 81 MG chewable tablet Chew 1 tablet (81 mg total) by mouth daily. Patient taking differently: Chew 81 mg by mouth at bedtime as needed for moderate pain.  01/11/18   Dustin Flock, MD  ibuprofen (ADVIL,MOTRIN) 600 MG tablet Take 600 mg by mouth every 6 (six) hours as needed.    [provider]    Allergies Shrimp [shellfish allergy]; Penicillins; and Tomato  Family History  Problem Relation Age of Onset  . Arthritis Mother   . Prostate cancer Father   . Diabetes Paternal Aunt   . Breast cancer Neg Hx     Social History Social History   Tobacco Use  . Smoking status: Current Every Day Smoker    Packs/day: 1.00    Years: 23.00    Pack years: 23.00    Types: Cigarettes    Last attempt to quit: 08/22/2017    Years since quitting:  0.8  . Smokeless tobacco: Never Used  Substance Use Topics  . Alcohol use: Yes    Comment: occasionally  . Drug use: Yes    Types: Marijuana    Review of Systems  Constitutional: Negative for fever. Cardiovascular: Negative for chest pain. Respiratory: Negative for shortness of breath. Musculoskeletal: Negative for back pain. Left foot wound as above.  Skin: Negative for rash. Neurological: Negative for headaches, focal weakness or numbness. ____________________________________________  PHYSICAL EXAM:  VITAL SIGNS: ED Triage Vitals  Enc Vitals Group     BP 07/16/18 1614 119/75     Pulse Rate 07/16/18 1614 76     Resp  07/16/18 1614 17     Temp 07/16/18 1614 98.4 F (36.9 C)     Temp Source 07/16/18 1614 Oral     SpO2 07/16/18 1614 97 %     Weight 07/16/18 1553 178 lb (80.7 kg)     Height 07/16/18 1553 5\' 7"  (1.702 m)     Head Circumference --      Peak Flow --      Pain Score 07/16/18 1553 9     Pain Loc --      Pain Edu? --      Excl. in [place]? --     Constitutional: Alert and oriented. Well appearing and in no distress. Head: Normocephalic and atraumatic. Eyes: Conjunctivae are normal. Normal extraocular movements Cardiovascular: Normal rate, regular rhythm. Normal distal pulses. Respiratory: Normal respiratory effort.  Musculoskeletal: Nontender with normal range of motion in all extremities.  Neurologic:  Normal gross sensation. Normal speech and language. No gross focal neurologic deficits are appreciated. Skin:  Skin is warm, dry and intact. No rash noted. Left foot with well-healed surgical scars dorsally and medially. The medial scar has a small, superficial, 1 cm area of fresh eschar and honey-colored crusting. This area appears to have been created by beign abraded or chafed. No abscess, purulence, or pointing noted. There is no erythema, edema, or lymphangitis appreciated. A stable appearing cystic formation noted over the navicular without fluctuance or induration. No  Psychiatric: Mood and affect are normal. Patient exhibits appropriate insight and judgment. ____________________________________________  PROCEDURES  Procedures Wound cleansing (saline & chlorhexidine soap) Xeroform gauze applied Tube gauze applied ____________________________________________  INITIAL IMPRESSION / ASSESSMENT AND PLAN / ED COURSE  Patient with ED evaluation of a local area of irritation to a previous surgical wound of the left foot.  While the patient is with vital signs are stable and no signs of any acute cellulitis, but will order systemic infection, or significant surgical wound dehiscence. Patient's  wound is cleansed and dressed with Xeroform gauze and tube dressing.  Area is without any signs of acute infection, abscess formation, or local cellulitis.  Patient is encouraged to keep the wound clean, dry, covered she should follow-up with Dr. Vickki Muff for ongoing wound care management.  Precautions have been reviewed. ____________________________________________  FINAL CLINICAL IMPRESSION(S) / ED DIAGNOSES  Final diagnoses:  Dehiscence of operative wound, initial encounter      Melvenia Needles, PA-C 07/17/18 0002    Merlyn Lot, MD 07/20/18 [phone]

## 2018-10-08 ENCOUNTER — Ambulatory Visit: Payer: Self-pay

## 2018-10-13 ENCOUNTER — Other Ambulatory Visit: Payer: Self-pay

## 2018-10-14 ENCOUNTER — Other Ambulatory Visit: Payer: Self-pay

## 2018-10-14 ENCOUNTER — Ambulatory Visit: Payer: Medicaid Other | Attending: Oncology

## 2018-10-14 ENCOUNTER — Ambulatory Visit
Admission: RE | Admit: 2018-10-14 | Discharge: 2018-10-14 | Disposition: A | Discharge status: Home / Self Care | Payer: Medicaid Other | Source: Ambulatory Visit | Attending: Oncology | Admitting: Oncology

## 2018-10-14 VITALS — BP 124/82 | HR 82 | Temp 96.5°F | Ht 68.0 in | Wt 174.0 lb

## 2018-10-14 DIAGNOSIS — R920 Mammographic microcalcification found on diagnostic imaging of breast: Secondary | ICD-10-CM

## 2018-10-14 DIAGNOSIS — N63 Unspecified lump in unspecified breast: Secondary | ICD-10-CM

## 2018-10-14 NOTE — Progress Notes (Addendum)
  Subjective:     Patient ID: Carol Crawford, female   DOB: [birth year], 43 y.o.   MRN: [id number]  HPI   Review of Systems     Objective:   Physical Exam Chest:     Breasts:        Right: Mass present. No swelling, bleeding, inverted nipple, nipple discharge, skin change or tenderness.        Left: No swelling, bleeding, inverted nipple, mass, nipple discharge, skin change or tenderness.       Comments: Mobile right breast mass 11:00 4 cm from areola       Assessment:     43 year old patient returns for 6 month follow-up right breast calcifications.  Patient screened, and meets BCCCP eligibility.  Patient does not have insurance, Medicare or Medicaid.  Instructed patient on breast self awareness using teach back method.  Palpated a 1.5 cm mass at 11:00 right breast which was identified on previous exam in June 2019.   Risk Assessment    Risk Scores      10/14/2018 09/01/2017   Last edited by: Rico Junker, RN Theodore Demark, RN   5-year risk: 0.6 % 0.5 %   Lifetime risk: 9.6 % 9.7 %            Plan:     Sent for diagnostic mammogram, and ultrasound.

## 2018-10-21 ENCOUNTER — Other Ambulatory Visit: Payer: Self-pay

## 2018-10-21 ENCOUNTER — Ambulatory Visit
Admission: RE | Admit: 2018-10-21 | Discharge: 2018-10-21 | Disposition: A | Discharge status: Home / Self Care | Payer: Medicaid Other | Source: Ambulatory Visit | Attending: Oncology | Admitting: Oncology

## 2018-10-21 DIAGNOSIS — R920 Mammographic microcalcification found on diagnostic imaging of breast: Secondary | ICD-10-CM

## 2018-10-21 HISTORY — PX: BREAST BIOPSY: SHX20

## 2018-10-23 NOTE — Progress Notes (Signed)
  Oncology Nurse Navigator Documentation  Navigator Location: [facility] (10/23/18 1300)   )Navigator Encounter Type: Introductory Phone Call (10/23/18 1300)   Abnormal Finding Date: 10/14/18 (10/23/18 1300) Confirmed Diagnosis Date: 10/21/18 (10/23/18 1300)               Patient Visit Type: Initial (10/23/18 1300)   Barriers/Navigation Needs: Coordination of Care;Education;Financial (10/23/18 1300)                          Time Spent with Patient: 45 (10/23/18 1300)   BCCCP patient notified of positive breast cancer results.  Referral sent to [facility] per patient request.  [facility] application explained to patient.  She will complete next week. Notifeid navigators at [facility].

## 2018-10-26 ENCOUNTER — Encounter: Payer: Self-pay | Admitting: *Deleted

## 2018-10-26 NOTE — Progress Notes (Signed)
Talked to patient today.  She is to come by and sign her BCCCP Medicaid application.  I will fax to DSS as soon as it is signed.

## 2018-10-28 LAB — SURGICAL PATHOLOGY

## 2018-10-29 ENCOUNTER — Other Ambulatory Visit: Payer: Self-pay | Admitting: Oncology

## 2018-10-29 NOTE — Progress Notes (Unsigned)
MD.

## 2018-11-04 ENCOUNTER — Ambulatory Visit: Payer: Self-pay | Admitting: General Surgery

## 2018-11-04 DIAGNOSIS — C50311 Malignant neoplasm of lower-inner quadrant of right female breast: Secondary | ICD-10-CM

## 2018-11-04 DIAGNOSIS — Z170 Estrogen receptor positive status [ER+]: Secondary | ICD-10-CM

## 2018-11-05 ENCOUNTER — Telehealth: Payer: Self-pay | Admitting: Radiation Oncology

## 2018-11-05 ENCOUNTER — Telehealth: Payer: Self-pay | Admitting: Hematology and Oncology

## 2018-11-05 ENCOUNTER — Other Ambulatory Visit: Payer: Self-pay | Admitting: General Surgery

## 2018-11-05 DIAGNOSIS — C50311 Malignant neoplasm of lower-inner quadrant of right female breast: Secondary | ICD-10-CM

## 2018-11-05 DIAGNOSIS — Z170 Estrogen receptor positive status [ER+]: Secondary | ICD-10-CM

## 2018-11-05 NOTE — Telephone Encounter (Signed)
Received referrals from Dr. [name] at [place] for med onc and genetics. I cld and scheduled Ms. Moynahan to see Dr. Lindi Adie on 8/18 at 415pm and genetics with Roma Kayser on 8/19 at 1pm. Pt has agreed to both appts. She will check mychart for address to our facility.

## 2018-11-05 NOTE — Telephone Encounter (Signed)
New Message: ° ° °LVM for patient to return call to schedule appt from referral received. °

## 2018-11-09 ENCOUNTER — Telehealth: Payer: Self-pay | Admitting: Radiation Oncology

## 2018-11-09 NOTE — Telephone Encounter (Signed)
New Message: ° ° °LVM for patient to return call to schedule appt from referral received. °

## 2018-11-09 NOTE — Progress Notes (Signed)
[place] [place] CONSULT NOTE  Patient Care Team: Kristian Covey, MD as PCP - General (Obstetrics and Gynecology) Rico Junker, RN as Registered Nurse Theodore Demark, RN as Registered Nurse  CHIEF COMPLAINTS/PURPOSE OF CONSULTATION:  Newly diagnosed breast cancer  HISTORY OF PRESENTING ILLNESS:  Carol Crawford 43 y.o. female is here because of recent diagnosis of invasive mammary carcinoma of the right breast. The patient's PCP palpated a lump in the 11 o'clock position of the right breast and noted right nipple discharge. Diagnostic mammogram on 04/09/18 showed a group of probably benign calcifications in the upper outer right breast. 78-monthfollow-up mammogram on 10/14/18 showed calcifications increased in size, spanning 1.5cm, with no discrete mass. Biopsy on 10/21/18 showed invasive mammary carcinoma, grade 1, HER-2 negative (0), ER+ 51-90%, PR+ >90%. She presents to the clinic today for initial evaluation and discussion of treatment options.   I reviewed her records extensively and collaborated the history with the patient.  SUMMARY OF ONCOLOGIC HISTORY: Oncology History  Malignant neoplasm of upper-outer quadrant of right breast in female, [place]  10/21/2018 Initial Diagnosis   663-monthollow-up of breast calcifications showed calcifications increased in size, spanning 1.5cm, with no discrete mass. Biopsy on 10/21/18 showed invasive mammary carcinoma, grade 1, HER-2 negative (0), ER+ 51-90%, PR[facility]90% [facility]    MEDICAL HISTORY:  Past Medical History:  Diagnosis Date  . ADHD   . [place] 1 disorder [place]  . Depressed   . Drug overdose 2010  . GERD (gastroesophageal reflux disease)    OCC  . Headache    MIGRAINES  . Migraine   . [place]ia [place]  . [facility]    SURGICAL HISTORY: Past Surgical History:  Procedure Laterality Date  . BREAST BIOPSY Right 10/21/2018   Affirm Bx-"X" clip-path pending  . INCISION AND  DRAINAGE Left 01/09/2018   Procedure: INCISION AND DRAINAGE-below fascia foot; multiple;  Surgeon: FoSamara DeistDPM;  Location: [facility] ORS;  Service: Podiatry;  Laterality: Left;  . INCISION AND DRAINAGE Left 04/17/2018   Procedure: I&D; BELOW FASCIA FOOT; MULTIPLE;  Surgeon: FoSamara DeistDPM;  Location: [facility] ORS;  Service: Podiatry;  Laterality: Left;  . REPAIR EXTENSOR TENDON Left 09/05/2017   Procedure: REPAIR EXTENSOR TENDON;  Surgeon: FoSamara DeistDPM;  Location: [facility] ORS;  Service: Podiatry;  Laterality: Left;    SOCIAL HISTORY: Social History   Socioeconomic History  . Marital status: Single    Spouse name: Not on file  . Number of children: Not on file  . Years of education: Not on file  . Highest education level: Not on file  Occupational History  . Not on file  Social Needs  . Financial resource strain: Not on file  . Food insecurity    Worry: Not on file    Inability: Not on file  . Transportation needs    Medical: Not on file    Non-medical: Not on file  Tobacco Use  . Smoking status: Current Every Day Smoker    Packs/day: 1.00    Years: 23.00    Pack years: 23.00    Types: Cigarettes    Last attempt to quit: 08/22/2017    Years since quitting: 1.2  . Smokeless tobacco: Never Used  Substance and Sexual Activity  . Alcohol use: Yes    Comment: occasionally  . Drug use: Yes    Types: Marijuana  . Sexual activity: Yes  Lifestyle  . Physical  activity    Days per week: Not on file    Minutes per session: Not on file  . Stress: Not on file  Relationships  . Social Herbalist on phone: Not on file    Gets together: Not on file    Attends religious service: Not on file    Active member of club or organization: Not on file    Attends meetings of clubs or organizations: Not on file    Relationship status: Not on file  . Intimate partner violence    Fear of current or ex partner: Not on file    Emotionally abused: Not on file    Physically  abused: Not on file    Forced sexual activity: Not on file  Other Topics Concern  . Not on file  Social History Narrative  . Not on file    FAMILY HISTORY: Family History  Problem Relation Age of Onset  . Arthritis Mother   . Hypertension Mother   . Prostate cancer Father   . Lung cancer Father   . Diabetes Paternal Aunt   . ADD / ADHD Daughter   . Bipolar disorder Daughter   . Hypertension Maternal Grandmother   . Stroke Maternal Grandmother   . Prostate cancer Maternal Grandfather   . Hypertension Maternal Grandfather   . Hypertension Paternal Grandfather   . Stroke Paternal Grandfather   . Breast cancer Neg Hx     ALLERGIES:  is allergic to shrimp [shellfish allergy]; penicillins; and tomato.  MEDICATIONS:  Current Outpatient Medications  Medication Sig Dispense Refill  . aspirin 81 MG chewable tablet Chew 1 tablet (81 mg total) by mouth daily. (Patient taking differently: Chew 81 mg by mouth at bedtime as needed for moderate pain. )    . ibuprofen (ADVIL,MOTRIN) 600 MG tablet Take 600 mg by mouth every 6 (six) hours as needed.     No current facility-administered medications for this visit.     REVIEW OF SYSTEMS:   Constitutional: Denies fevers, chills or abnormal night sweats Eyes: Denies blurriness of vision, double vision or watery eyes Ears, nose, mouth, throat, and face: Denies mucositis or sore throat Respiratory: Denies cough, dyspnea or wheezes Cardiovascular: Denies palpitation, chest discomfort or lower extremity swelling Gastrointestinal:  Denies nausea, heartburn or change in bowel habits Skin: Denies abnormal skin rashes Lymphatics: Denies new lymphadenopathy or easy bruising Neurological:Denies numbness, tingling or new weaknesses Behavioral/Psych: Mood is stable, no new changes  Breast: Right breast mass, nipple discharge  Extremity: Right foot nonhealing wound (Mirror fell on her foot led to frequent infections) All other systems were reviewed  with the patient and are negative.  PHYSICAL EXAMINATION: ECOG PERFORMANCE STATUS: 1 - Symptomatic but completely ambulatory  Vitals:   11/10/18 1619  BP: 126/80  Pulse: 74  Resp: 20  Temp: 98.9 F (37.2 C)  SpO2: 100%   Filed Weights   11/10/18 1619  Weight: 167 lb (75.8 kg)    GENERAL:alert, no distress and comfortable SKIN: skin color, texture, turgor are normal, no rashes or significant lesions EYES: normal, conjunctiva are pink and non-injected, sclera clear OROPHARYNX:no exudate, no erythema and lips, buccal mucosa, and tongue normal  NECK: supple, thyroid normal size, non-tender, without nodularity LYMPH:  no palpable lymphadenopathy in the cervical, axillary or inguinal LUNGS: clear to auscultation and percussion with normal breathing effort HEART: regular rate & rhythm and no murmurs and no lower extremity edema ABDOMEN:abdomen soft, non-tender and normal bowel sounds Musculoskeletal:no cyanosis  of digits and no clubbing  PSYCH: alert & oriented x 3 with fluent speech NEURO: no focal motor/sensory deficits BREAST: No palpable nodules in breast. No palpable axillary or supraclavicular lymphadenopathy (exam performed in the presence of a chaperone)   LABORATORY DATA:  I have reviewed the data as listed Lab Results  Component Value Date   WBC 7.2 03/31/2018   HGB 14.2 03/31/2018   HCT 41.7 03/31/2018   MCV 95.2 03/31/2018   PLT 322 03/31/2018   Lab Results  Component Value Date   NA 138 03/31/2018   K 3.6 03/31/2018   CL 105 03/31/2018   CO2 27 03/31/2018    RADIOGRAPHIC STUDIES: I have personally reviewed the radiological reports and agreed with the findings in the report.  ASSESSMENT AND PLAN:  Malignant neoplasm of upper-outer quadrant of right breast in female, estrogen receptor positive ([place]) 10/21/2018: 87-monthfollow-up mammogram revealed increasing left breast calcifications measuring 1.5 cm biopsy revealed invasive mammary cancer grade 1 ER PR  [facility]  Pathology and radiology counseling:Discussed with the patient, the details of pathology including the type of breast cancer,the clinical staging, the significance of ER, PR and HER-2/neu receptors and the implications for treatment. After reviewing the pathology in detail, we proceeded to discuss the different treatment options between surgery, radiation, chemotherapy, antiestrogen therapies.  Recommendations: 1.  Patient is contemplating on mastectomy 2. Oncotype DX testing to determine if chemotherapy would be of any benefit followed by 3. Adjuvant antiestrogen therapy  No role of radiation if she undergoes mastectomy.  Oncotype counseling: I discussed Oncotype DX test. I explained to the patient that this is a 21 gene panel to evaluate patient tumors DNA to calculate recurrence score. This would help determine whether patient has high risk or intermediate risk or low risk breast cancer. She understands that if her tumor was found to be high risk, she would benefit from systemic chemotherapy. If low risk, no need of chemotherapy. If she was found to be intermediate risk, we would need to evaluate the score as well as other risk factors and determine if an abbreviated chemotherapy may be of benefit.  Return to clinic after surgery to discuss final pathology report and then determine if Oncotype DX testing will need to be sent (Doximity video visit).     All questions were answered. The patient knows to call the clinic with any problems, questions or concerns.   VRulon Eisenmenger MD 11/10/2018    I, Molly Dorshimer, am acting as scribe for VNicholas Lose MD.  I have reviewed the above documentation for accuracy and completeness, and I agree with the above.

## 2018-11-10 ENCOUNTER — Other Ambulatory Visit: Payer: Self-pay

## 2018-11-10 ENCOUNTER — Encounter: Payer: Self-pay | Admitting: *Deleted

## 2018-11-10 ENCOUNTER — Telehealth: Payer: Self-pay | Admitting: *Deleted

## 2018-11-10 ENCOUNTER — Inpatient Hospital Stay: Payer: Medicaid Other | Attending: Hematology and Oncology | Admitting: Hematology and Oncology

## 2018-11-10 DIAGNOSIS — Z170 Estrogen receptor positive status [ER+]: Secondary | ICD-10-CM | POA: Insufficient documentation

## 2018-11-10 DIAGNOSIS — C50411 Malignant neoplasm of upper-outer quadrant of right female breast: Secondary | ICD-10-CM | POA: Diagnosis not present

## 2018-11-10 DIAGNOSIS — F17210 Nicotine dependence, cigarettes, uncomplicated: Secondary | ICD-10-CM | POA: Diagnosis not present

## 2018-11-10 NOTE — Telephone Encounter (Signed)
Spoke with patient regarding her new patient appointment today with Dr. Lindi Adie. Discussed navigation resources and gave her contact information.  Informed her that radiation oncology has been trying to reach her to schedule a consult as well.  She will check her messages and I will let them know.  No further questions at this time.  Encouraged her to call with any needs or concerns.

## 2018-11-10 NOTE — Assessment & Plan Note (Addendum)
10/21/2018: 82-monthfollow-up mammogram revealed increasing left breast calcifications measuring 1.5 cm biopsy revealed invasive mammary cancer grade 1 ER PR positive [facility] T[facility] M0 stage Ia clinical stage  Pathology and radiology counseling:Discussed with the patient, the details of pathology including the type of breast cancer,the clinical staging, the significance of ER, PR and HER-2/neu receptors and the implications for treatment. After reviewing the pathology in detail, we proceeded to discuss the different treatment options between surgery, radiation, chemotherapy, antiestrogen therapies.  Recommendations: 1.  Patient is contemplating on mastectomy 2. Oncotype DX testing to determine if chemotherapy would be of any benefit followed by 3. Adjuvant antiestrogen therapy  No role of radiation if she undergoes mastectomy.  Oncotype counseling: I discussed Oncotype DX test. I explained to the patient that this is a 21 gene panel to evaluate patient tumors DNA to calculate recurrence score. This would help determine whether patient has high risk or intermediate risk or low risk breast cancer. She understands that if her tumor was found to be high risk, she would benefit from systemic chemotherapy. If low risk, no need of chemotherapy. If she was found to be intermediate risk, we would need to evaluate the score as well as other risk factors and determine if an abbreviated chemotherapy may be of benefit.  Return to clinic after surgery to discuss final pathology report and then determine if Oncotype DX testing will need to be sent (Doximity video visit).

## 2018-11-11 ENCOUNTER — Inpatient Hospital Stay: Payer: Medicaid Other

## 2018-11-11 ENCOUNTER — Other Ambulatory Visit: Payer: Self-pay

## 2018-11-11 ENCOUNTER — Telehealth: Payer: Self-pay | Admitting: Radiation Oncology

## 2018-11-11 ENCOUNTER — Encounter: Payer: Self-pay | Admitting: Genetic Counselor

## 2018-11-11 ENCOUNTER — Inpatient Hospital Stay (HOSPITAL_BASED_OUTPATIENT_CLINIC_OR_DEPARTMENT_OTHER): Payer: Medicaid Other | Admitting: Genetic Counselor

## 2018-11-11 ENCOUNTER — Other Ambulatory Visit: Payer: Self-pay | Admitting: Genetic Counselor

## 2018-11-11 DIAGNOSIS — Z8042 Family history of malignant neoplasm of prostate: Secondary | ICD-10-CM

## 2018-11-11 DIAGNOSIS — Z803 Family history of malignant neoplasm of breast: Secondary | ICD-10-CM

## 2018-11-11 DIAGNOSIS — C50411 Malignant neoplasm of upper-outer quadrant of right female breast: Secondary | ICD-10-CM

## 2018-11-11 DIAGNOSIS — Z800 Family history of malignant neoplasm of digestive organs: Secondary | ICD-10-CM

## 2018-11-11 DIAGNOSIS — Z1379 Encounter for other screening for genetic and chromosomal anomalies: Secondary | ICD-10-CM

## 2018-11-11 DIAGNOSIS — Z170 Estrogen receptor positive status [ER+]: Secondary | ICD-10-CM

## 2018-11-11 NOTE — Telephone Encounter (Signed)
New message: ° ° °LVM for patient to return call to schedule from referral received. °

## 2018-11-11 NOTE — Progress Notes (Signed)
REFERRING PROVIDER: Nicholas Lose, MD [place],  [place] [id number]  PRIMARY PROVIDER:  Kristian Covey, MD  PRIMARY REASON FOR VISIT:  1. Malignant neoplasm of upper-outer quadrant of right breast in female, estrogen receptor positive ([place])   2. Family history of breast cancer   3. Family history of prostate cancer   4. Family history of colon cancer      HISTORY OF PRESENT ILLNESS:   Carol Crawford, a 43 y.o. female, was seen for a [place] cancer genetics consultation at the request of Dr. Lindi Adie due to a personal and family history of cancer.  Carol Crawford presents to clinic today to discuss the possibility of a hereditary predisposition to cancer, genetic testing, and to further clarify her future cancer risks, as well as potential cancer risks for family members.   In 2020, at the age of 43, Carol Crawford was diagnosed with invasive ductal carcinoma of the right breast. The tumor is ER/PR+ and Her2-.     CANCER HISTORY:  Oncology History  Malignant neoplasm of upper-outer quadrant of right breast in female, estrogen receptor positive ([place])  10/21/2018 Initial Diagnosis   13-monthfollow-up of breast calcifications showed calcifications increased in size, spanning 1.5cm, with no discrete mass. Biopsy on 10/21/18 showed invasive mammary carcinoma, grade 1, HER-2 negative (0), ER+ 51-90%, PR+ >90% ([facility]   11/10/2018 Cancer Staging   Staging form: Breast, AJCC 8th Edition - Clinical stage from 11/10/2018: Stage IA (cT1c, cN0, cM0, G1, ER+, PR+, HER2-) - Signed by GNicholas Lose MD on 11/10/2018      RISK FACTORS:  Menarche was at age 43  First live birth at age 43  OCP use for approximately 7-8 years.  Ovaries intact: yes.  Hysterectomy: no.  Menopausal status: premenopausal.  HRT use: 0 years. Colonoscopy: no; not examined. Mammogram within the last year: yes. Number of breast biopsies: 1. Up to date with pelvic exams: yes. Any excessive radiation  exposure in the past: no  Past Medical History:  Diagnosis Date  . ADHD   . Bipolar 1 disorder ([place]   . Depressed   . Drug overdose 2010  . Family history of breast cancer   . Family history of colon cancer   . Family history of prostate cancer   . GERD (gastroesophageal reflux disease)    OCC  . Headache    MIGRAINES  . Migraine   . [place]izophrenia [place]   . Stroke ([facility]     Past Surgical History:  Procedure Laterality Date  . BREAST BIOPSY Right 10/21/2018   Affirm Bx-"X" clip-path pending  . INCISION AND DRAINAGE Left 01/09/2018   Procedure: INCISION AND DRAINAGE-below fascia foot; multiple;  Surgeon: FSamara Deist DPM;  Location: [facility] ORS;  Service: Podiatry;  Laterality: Left;  . INCISION AND DRAINAGE Left 04/17/2018   Procedure: I&D; BELOW FASCIA FOOT; MULTIPLE;  Surgeon: FSamara Deist DPM;  Location: [facility] ORS;  Service: Podiatry;  Laterality: Left;  . REPAIR EXTENSOR TENDON Left 09/05/2017   Procedure: REPAIR EXTENSOR TENDON;  Surgeon: FSamara Deist DPM;  Location: [facility] ORS;  Service: Podiatry;  Laterality: Left;    Social History   Socioeconomic History  . Marital status: Single    Spouse name: Not on file  . Number of children: Not on file  . Years of education: Not on file  . Highest education level: Not on file  Occupational History  . Not on file  Social Needs  . Financial resource strain: Not  on file  . Food insecurity    Worry: Not on file    Inability: Not on file  . Transportation needs    Medical: Not on file    Non-medical: Not on file  Tobacco Use  . Smoking status: Current Every Day Smoker    Packs/day: 1.00    Years: 23.00    Pack years: 23.00    Types: Cigarettes    Last attempt to quit: 08/22/2017    Years since quitting: 1.2  . Smokeless tobacco: Never Used  Substance and Sexual Activity  . Alcohol use: Yes    Comment: occasionally  . Drug use: Yes    Types: Marijuana  . Sexual activity: Yes  Lifestyle  . Physical activity     Days per week: Not on file    Minutes per session: Not on file  . Stress: Not on file  Relationships  . Social Herbalist on phone: Not on file    Gets together: Not on file    Attends religious service: Not on file    Active member of club or organization: Not on file    Attends meetings of clubs or organizations: Not on file    Relationship status: Not on file  Other Topics Concern  . Not on file  Social History Narrative  . Not on file     FAMILY HISTORY:  We obtained a detailed, 4-generation family history.  Significant diagnoses are listed below: Family History  Problem Relation Age of Onset  . Arthritis Mother   . Hypertension Mother   . Cervical cancer Mother   . Prostate cancer Father   . Lung cancer Father   . Diabetes Paternal Aunt   . Breast cancer Paternal Aunt   . ADD / ADHD Daughter   . Bipolar disorder Daughter   . Hypertension Maternal Grandmother   . Stroke Maternal Grandmother   . Prostate cancer Maternal Grandfather   . Hypertension Maternal Grandfather   . Colon cancer Maternal Grandfather   . Hypertension Paternal Grandfather   . Stroke Paternal Grandfather   . Breast cancer Cousin     The patient has two daughters who are cancer free.  She has two full sisters and a full brother who are cancer free.  She also has a paternal half brother who does not have cancer.  Her father is deceased and her mother is living.   The patient's mother was diagnosed with cervical cancer at 76.  She has two sisters and two brothers.  One brother died of HIV.  The maternal grandparents are deceased.  The grandfather had colon and prostate cancer.  The patient's father died of prostate cancer.  He had two brothers, one who had a daughter with breast cancer.  He also had four sisters, one who had breast cancer.  The paternal grandparents are deceased.  Ms. Castagnola is unaware of previous family history of genetic testing for hereditary cancer risks. Patient's  maternal ancestors are of African American descent, and paternal ancestors are of African American descent. There is no reported Ashkenazi Jewish ancestry. There is no known consanguinity.  GENETIC COUNSELING ASSESSMENT: Ms. Kjos is a 43 y.o. female with a personal and family history of cancer which is somewhat suggestive of a hereditary breast cancer syndrome and predisposition to cancer given her young age of onset of breast cancer. We, therefore, discussed and recommended the following at today's visit.   DISCUSSION: We discussed that 5 - 10% of  breast cancer is hereditary, with most cases associated with BRCA mutations.  There are other genes that can be associated with hereditary breast cancer syndromes.  These include ATM, CHEK2 and PALB2.  We discussed that testing is beneficial for several reasons including knowing how to follow individuals after completing their treatment, identifying whether potential treatment options such as PARP inhibitors would be beneficial, and understand if other family members could be at risk for cancer and allow them to undergo genetic testing.   We reviewed the characteristics, features and inheritance patterns of hereditary cancer syndromes. We also discussed genetic testing, including the appropriate family members to test, the process of testing, insurance coverage and turn-around-time for results. We discussed the implications of a negative, positive and/or variant of uncertain significant result. In order to get genetic test results in a timely manner so that Ms. Sabine can use these genetic test results for surgical decisions, we recommended Ms. Crume pursue genetic testing for the 9-gene STAT panel. Once complete, we recommend Ms. Tomas pursue reflex genetic testing to the multi cancer gene panel. The Multi-Gene Panel offered by [facility] includes sequencing and/or deletion duplication testing of the following 85 genes: AIP, ALK, APC, ATM, AXIN2,BAP1,  BARD1, BLM, BMPR1A,  BRCA1, BRCA2, BRIP1, CASR, CDC73, CDH1, CDK4, CDKN1B, CDKN1C, CDKN2A (p14ARF), CDKN2A (p16INK4a), CEBPA, CHEK2, CTNNA1, DICER1, DIS3L2, EGFR (c.2369C>T, p.Thr790Met variant only), EPCAM (Deletion/duplication testing only), FH, FLCN, GATA2, GPC3, GREM1 (Promoter region deletion/duplication testing only), HOXB13 (c.251G>A, p.Gly84Glu), HRAS, KIT, MAX, MEN1, MET, MITF (c.952G>A, p.Glu318Lys variant only), MLH1, MSH2, MSH3, MSH6, MUTYH, NBN, NF1, NF2, NTHL1, PALB2, PDGFRA, PHOX2B, PMS2, POLD1, POLE, POT1, PRKAR1A, PTCH1, PTEN, RAD50, RAD51C, RAD51D, RB1, RECQL4, RET, RNF43, RUNX1, SDHAF2, SDHA (sequence changes only), SDHB, SDHC, SDHD, SMAD4, SMARCA4, SMARCB1, SMARCE1, STK11, SUFU, TERC, TERT, TMEM127, TP53, TSC1, TSC2, VHL, WRN and WT1.    Based on Ms. Draughn's personal and family history of cancer, she meets medical criteria for genetic testing. Despite that she meets criteria, she may still have an out of pocket cost.   PLAN: After considering the risks, benefits, and limitations, Ms. Gudiel provided informed consent to pursue genetic testing and the blood sample was sent to [facility] for analysis of the STAT panel and Multi cancer panel. Results should be available within approximately 7-12 days, with an additional 2-3 weeks' time, at which point they will be disclosed by telephone to Ms. Casino, as will any additional recommendations warranted by these results. Ms. Allshouse will receive a summary of her genetic counseling visit and a copy of her results once available. This information will also be available in Epic.   Lastly, we encouraged Ms. Dickenson to remain in contact with cancer genetics annually so that we can continuously update the family history and inform her of any changes in cancer genetics and testing that may be of benefit for this family.   Ms. [place] questions were answered to her satisfaction today. Our contact information was provided should additional questions or concerns arise. Thank you  for the referral and allowing [place] to share in the care of your patient.   Karen P. Florene Glen, [place], [facility] Licensed, Insurance risk surveyor [email or web address] phone: [phone]  The patient was seen for a total of 45 minutes in face-to-face genetic counseling.  This patient was discussed with Drs. Magrinat, Lindi Adie and/or Burr Medico who agrees with the above.    _______________________________________________________________________ For Office Staff:  Number of people involved in session: 1 Was an Intern/ student involved with case: yes [employer]

## 2018-11-12 ENCOUNTER — Telehealth: Payer: Self-pay | Admitting: Hematology and Oncology

## 2018-11-12 NOTE — Telephone Encounter (Signed)
I could not reach regarding schedule for doximity

## 2018-11-19 ENCOUNTER — Encounter: Payer: Self-pay | Admitting: *Deleted

## 2018-11-19 ENCOUNTER — Telehealth: Payer: Self-pay | Admitting: Genetic Counselor

## 2018-11-19 NOTE — Telephone Encounter (Signed)
Revealed negative genetic testing on STAT panel.  We will call when the remainder of testing is completed.

## 2018-11-23 ENCOUNTER — Other Ambulatory Visit: Payer: Self-pay

## 2018-11-23 ENCOUNTER — Encounter (HOSPITAL_BASED_OUTPATIENT_CLINIC_OR_DEPARTMENT_OTHER): Payer: Self-pay

## 2018-11-25 ENCOUNTER — Other Ambulatory Visit: Payer: Self-pay

## 2018-11-25 ENCOUNTER — Emergency Department
Admission: EM | Admit: 2018-11-25 | Discharge: 2018-11-25 | Disposition: A | Discharge status: Home / Self Care | Payer: Medicaid Other | Attending: Emergency Medicine | Admitting: Emergency Medicine

## 2018-11-25 DIAGNOSIS — N644 Mastodynia: Secondary | ICD-10-CM | POA: Diagnosis present

## 2018-11-25 DIAGNOSIS — F1210 Cannabis abuse, uncomplicated: Secondary | ICD-10-CM | POA: Diagnosis not present

## 2018-11-25 DIAGNOSIS — M5412 Radiculopathy, cervical region: Secondary | ICD-10-CM | POA: Diagnosis not present

## 2018-11-25 DIAGNOSIS — Z8673 Personal history of transient ischemic attack (TIA), and cerebral infarction without residual deficits: Secondary | ICD-10-CM | POA: Diagnosis not present

## 2018-11-25 DIAGNOSIS — F17210 Nicotine dependence, cigarettes, uncomplicated: Secondary | ICD-10-CM | POA: Diagnosis not present

## 2018-11-25 DIAGNOSIS — Z853 Personal history of malignant neoplasm of breast: Secondary | ICD-10-CM | POA: Insufficient documentation

## 2018-11-25 DIAGNOSIS — Z7982 Long term (current) use of aspirin: Secondary | ICD-10-CM | POA: Diagnosis not present

## 2018-11-25 MED ORDER — KETOROLAC TROMETHAMINE 30 MG/ML IJ SOLN
30.0000 mg | Freq: Once | INTRAMUSCULAR | Status: AC
  Administered 2018-11-25: 30 mg via INTRAMUSCULAR
  Filled 2018-11-25: qty 1

## 2018-11-25 MED ORDER — OXYCODONE HCL 5 MG PO TABS
5.0000 mg | ORAL_TABLET | Freq: Once | ORAL | Status: AC
  Administered 2018-11-25: 5 mg via ORAL
  Filled 2018-11-25: qty 1

## 2018-11-25 MED ORDER — OXYCODONE HCL 5 MG PO TABS: 5.0000 mg | ORAL_TABLET | Freq: Three times a day (TID) | ORAL | 0 refills | Status: DC | PRN

## 2018-11-25 NOTE — ED Triage Notes (Signed)
Pt states she has right breast pain and while at work tonight while working she may have over exterted herself and pain spread down the right arm and into the fingertips. Pt has a new dx of breast cancer and next week she goes to have a seed implanted on the right breast.

## 2018-11-25 NOTE — ED Provider Notes (Signed)
[facility] Emergency Department Provider Note ____________________________________________   First MD Initiated Contact with Patient 11/25/18 2022     (approximate)  I have reviewed the triage vital signs and the nursing notes.   HISTORY  Chief Complaint Breast Pain  HPI Carol Crawford is a 43 y.o. female who presents to the emergency department for treatment and evaluation of right side breast pain.  Patient has known breast cancer and is scheduled for seed and mastectomy next week. While at work tonight, she had her arms raised over her head and felt a sudden cramplike, sharp pain. She states that she jerked her arm to the side in a quick motion, then felt a tingling sensation from her neck to her fingertips. The breast pain is still there, but the tingling sensation is resolving. No alleviating measures attempted.   Past Medical History:  Diagnosis Date  . ADHD   . Bipolar 1 disorder ([place])   . Depressed   . Drug overdose 2010  . Family history of breast cancer   . Family history of colon cancer   . Family history of prostate cancer   . GERD (gastroesophageal reflux disease)    OCC  . Headache    MIGRAINES  . History of MRSA infection    foot infection  . Migraine   . Schizophrenia ([place])   . Stroke [facility])    after foot surgery, left side was numb, fully recovered    Patient Active Problem List   Diagnosis Date Noted  . Family history of breast cancer   . Family history of prostate cancer   . Family history of colon cancer   . Malignant neoplasm of upper-outer quadrant of right breast in female, estrogen receptor positive ([place]) 11/10/2018  . Acute ischemic stroke ([place])   . Left sided numbness 01/09/2018  . Depressed   . Bipolar 1 disorder [facility])     Past Surgical History:  Procedure Laterality Date  . BREAST BIOPSY Right 10/21/2018   Affirm Bx-"X" clip-path pending  . INCISION AND DRAINAGE Left 01/09/2018   Procedure: INCISION AND  DRAINAGE-below fascia foot; multiple;  Surgeon: [name], DPM;  Location: [facility] ORS;  Service: Podiatry;  Laterality: Left;  . INCISION AND DRAINAGE Left 04/17/2018   Procedure: I&D; BELOW FASCIA FOOT; MULTIPLE;  Surgeon: [name], DPM;  Location: [facility] ORS;  Service: Podiatry;  Laterality: Left;  . REPAIR EXTENSOR TENDON Left 09/05/2017   Procedure: REPAIR EXTENSOR TENDON;  Surgeon: [name], DPM;  Location: [facility] ORS;  Service: Podiatry;  Laterality: Left;    Prior to Admission medications   Medication Sig Start Date End Date Taking? Authorizing Provider  aspirin 81 MG chewable tablet Chew 1 tablet (81 mg total) by mouth daily. Patient taking differently: Chew 81 mg by mouth at bedtime as needed for moderate pain.  01/11/18   Dustin Flock, MD  ibuprofen (ADVIL,MOTRIN) 600 MG tablet Take 600 mg by mouth every 6 (six) hours as needed.    [provider]  oxyCODONE (ROXICODONE) 5 MG immediate release tablet Take 1 tablet (5 mg total) by mouth every 8 (eight) hours as needed. 11/25/18 11/25/19  Victorino Dike, FNP    Allergies Shrimp [shellfish allergy], Penicillins, and Tomato  Family History  Problem Relation Age of Onset  . Arthritis Mother   . Hypertension Mother   . Cervical cancer Mother   . Prostate cancer Father   . Lung cancer Father   . Diabetes Paternal Aunt   .  Breast cancer Paternal Aunt   . ADD / ADHD Daughter   . Bipolar disorder Daughter   . Hypertension Maternal Grandmother   . Stroke Maternal Grandmother   . Prostate cancer Maternal Grandfather   . Hypertension Maternal Grandfather   . Colon cancer Maternal Grandfather   . Hypertension Paternal Grandfather   . Stroke Paternal Grandfather   . Breast cancer Cousin     Social History Social History   Tobacco Use  . Smoking status: Current Every Day Smoker    Packs/day: 0.50    Years: 23.00    Pack years: 11.50    Types: Cigarettes    Last attempt to quit: 08/22/2017    Years since  quitting: 1.2  . Smokeless tobacco: Never Used  Substance Use Topics  . Alcohol use: Yes    Comment: occasionally  . Drug use: Yes    Types: Marijuana    Comment: 1-2x week    Review of Systems  Constitutional: No fever/chills. Eyes: No visual changes. ENT: No sore throat. Cardiovascular: Denies chest pain. Respiratory: Denies shortness of breath. Gastrointestinal: No abdominal pain.  No nausea, no vomiting.  No diarrhea.  No constipation. Genitourinary: Negative for dysuria. Musculoskeletal: Negative for back pain. Skin: Negative for rash. Positive for breast pain. Neurological: Positive for radiculopathy. ____________________________________________   PHYSICAL EXAM:  VITAL SIGNS: ED Triage Vitals  Enc Vitals Group     BP 11/25/18 1948 (!) 136/92     Pulse Rate 11/25/18 1948 70     Resp 11/25/18 1948 18     Temp 11/25/18 1948 98.7 F (37.1 C)     Temp Source 11/25/18 1948 Oral     SpO2 11/25/18 1948 100 %     Weight 11/25/18 1951 167 lb (75.8 kg)     Height 11/25/18 1951 5\' 7"  (1.702 m)     Head Circumference --      Peak Flow --      Pain Score 11/25/18 1950 9     Pain Loc --      Pain Edu? --      Excl. in [place]? --     Constitutional: Alert and oriented. Well appearing and in no acute distress. Eyes: Conjunctivae are normal. PERRL. EOMI. Head: Atraumatic. Nose: No congestion/rhinnorhea. Mouth/Throat: Mucous membranes are moist.  Oropharynx non-erythematous. Neck: No stridor.   Hematological/Lymphatic/Immunilogical: No cervical lymphadenopathy. Cardiovascular: Normal rate, regular rhythm. Grossly normal heart sounds.  Good peripheral circulation. Respiratory: Normal respiratory effort.  No retractions. Lungs CTAB. Gastrointestinal: Soft and nontender. No distention. No abdominal bruits. No CVA tenderness. Genitourinary: n/a Musculoskeletal: No lower extremity tenderness nor edema.  No joint effusions. Neurologic:  Normal speech and language. No gross focal  neurologic deficits are appreciated. No gait instability.  Two-point discretion intact in the right upper extremity Skin:  Skin is warm, dry and intact. No rash noted.  Area of indicated pain in the left breast is without erythema or lesion Psychiatric: Mood and affect are normal.  Appropriately tearful in relation to do diagnosis of breast cancer.  ____________________________________________   LABS (all labs ordered are listed, but only abnormal results are displayed)  Labs Reviewed - No data to display ____________________________________________  EKG  Not indicated ____________________________________________  RADIOLOGY  ED MD interpretation:    Not indicated  Official radiology report(s): No results found.  ____________________________________________   PROCEDURES  Procedure(s) performed (including Critical Care):  Procedures  ____________________________________________   INITIAL IMPRESSION / ASSESSMENT AND PLAN     43 year old female  presenting to the emergency department for evaluation of breast pain and radiculopathy in the right upper extremity.  See HPI for further details.  The radiculopathy seems most related to the rapid motion that she made while moving her arm from overhead.  Exam is reassuring.  Patient feels that she "overdid it" while at work this evening.  She denies any new constitutional symptoms of concern.    ED COURSE  Toradol and oxycodone given while here.  Patient states that she feels much better and feels that she is ready to go home.  The radiculopathy in the neck to fingertips has significantly lessened and she states that it is now only in her fingertips.  She will be discharged home and given a few tablets of Roxicodone but was encouraged to only take them if the pain continues to be severe.  She was advised that she should call her oncologist if pain does not improve over the next day or so.  She was encouraged to return to the  emergency department for any symptom changes or worsens if she is unable to schedule an appointment. ____________________________________________   FINAL CLINICAL IMPRESSION(S) / ED DIAGNOSES  Final diagnoses:  Breast pain in female  Cervical radiculopathy     ED Discharge Orders         Ordered    oxyCODONE (ROXICODONE) 5 MG immediate release tablet  Every 8 hours PRN     11/25/18 2118           Note:  This document was prepared using Dragon voice recognition software and may include unintentional dictation errors.   Victorino Dike, FNP 11/25/18 2326    Harvest Dark, MD 11/26/18 2219

## 2018-11-25 NOTE — ED Notes (Addendum)
Pt has right breast pain.  Pt dx with breast cancer and is having a mastectomy.  Pt reports no drainage from nipple.  Pt has a marker in the right breast and is concerned pain may be coming from the marker.  Pt also reports pain in right arm and fingers.   Pt alert.

## 2018-11-26 ENCOUNTER — Encounter: Payer: Self-pay | Admitting: Genetic Counselor

## 2018-11-26 ENCOUNTER — Ambulatory Visit: Payer: Self-pay | Admitting: Genetic Counselor

## 2018-11-26 ENCOUNTER — Telehealth: Payer: Self-pay | Admitting: Genetic Counselor

## 2018-11-26 DIAGNOSIS — C50411 Malignant neoplasm of upper-outer quadrant of right female breast: Secondary | ICD-10-CM

## 2018-11-26 DIAGNOSIS — Z1379 Encounter for other screening for genetic and chromosomal anomalies: Secondary | ICD-10-CM

## 2018-11-26 DIAGNOSIS — Z170 Estrogen receptor positive status [ER+]: Secondary | ICD-10-CM

## 2018-11-26 NOTE — Progress Notes (Signed)
HPI:  Carol Crawford was previously seen in the [place] clinic due to a personal and family history of breast cancer and concerns regarding a hereditary predisposition to cancer. Please refer to our prior cancer genetics clinic note for more information regarding our discussion, assessment and recommendations, at the time. Carol Crawford recent genetic test results were disclosed to her, as were recommendations warranted by these results. These results and recommendations are discussed in more detail below.  CANCER HISTORY:  Oncology History  Malignant neoplasm of upper-outer quadrant of right breast in female, estrogen receptor positive ([facility])  10/21/2018 Initial Diagnosis   21-monthfollow-up of breast calcifications showed calcifications increased in size, spanning 1.5cm, with no discrete mass. Biopsy on 10/21/18 showed invasive mammary carcinoma, grade 1, HER-2 negative (0), ER+ 51-90%, PR+ >90% [facility]   11/10/2018 Cancer Staging   Staging form: Breast, AJCC 8th Edition - Clinical stage from 11/10/2018: Stage IA (cT1c, cN0, cM0, G1, ER+, PR+, HER2-) - Signed by GNicholas Lose MD on 11/10/2018   11/25/2018 Genetic Testing   Negative genetic testing on a STAT panel.  The STAT Breast cancer panel offered by [facility] includes sequencing and rearrangement analysis for the following 9 genes:  ATM, BRCA1, BRCA2, CDH1, CHEK2, PALB2, PTEN, STK11 and TP53.   The report date is 11/18/2018.    SMARCA4 c.925C>A VUS identified on the multicancer panel.  The Multi-Gene Panel offered by [facility] includes sequencing and/or deletion duplication testing of the following 85 genes: AIP, ALK, APC, ATM, AXIN2,BAP1,  BARD1, BLM, BMPR1A, BRCA1, BRCA2, BRIP1, CASR, CDC73, CDH1, CDK4, CDKN1B, CDKN1C, CDKN2A (p14ARF), CDKN2A (p16INK4a), CEBPA, CHEK2, CTNNA1, DICER1, DIS3L2, EGFR (c.2369C>T, p.Thr790Met variant only), EPCAM (Deletion/duplication testing only), FH, FLCN, GATA2, GPC3, GREM1 (Promoter region  deletion/duplication testing only), HOXB13 (c.251G>A, p.Gly84Glu), HRAS, KIT, MAX, MEN1, MET, MITF (c.952G>A, p.Glu318Lys variant only), MLH1, MSH2, MSH3, MSH6, MUTYH, NBN, NF1, NF2, NTHL1, PALB2, PDGFRA, PHOX2B, PMS2, POLD1, POLE, POT1, PRKAR1A, PTCH1, PTEN, RAD50, RAD51C, RAD51D, RB1, RECQL4, RET, RNF43, RUNX1, SDHAF2, SDHA (sequence changes only), SDHB, SDHC, SDHD, SMAD4, SMARCA4, SMARCB1, SMARCE1, STK11, SUFU, TERC, TERT, TMEM127, TP53, TSC1, TSC2, VHL, WRN and WT1.  The report date is November 25, 2018.     FAMILY HISTORY:  We obtained a detailed, 4-generation family history.  Significant diagnoses are listed below: Family History  Problem Relation Age of Onset   Arthritis Mother    Hypertension Mother    Cervical cancer Mother    Prostate cancer Father    Lung cancer Father    Diabetes Paternal Aunt    Breast cancer Paternal Aunt    ADD / ADHD Daughter    Bipolar disorder Daughter    Hypertension Maternal Grandmother    Stroke Maternal Grandmother    Prostate cancer Maternal Grandfather    Hypertension Maternal Grandfather    Colon cancer Maternal Grandfather    Hypertension Paternal Grandfather    Stroke Paternal Grandfather    Breast cancer Cousin     The patient has two daughters who are cancer free.  She has two full sisters and a full brother who are cancer free.  She also has a paternal half brother who does not have cancer.  Her father is deceased and her mother is living.   The patient's mother was diagnosed with cervical cancer at 263  She has two sisters and two brothers.  One brother died of HIV.  The maternal grandparents are deceased.  The grandfather had colon and prostate cancer.  The patient's  father died of prostate cancer.  He had two brothers, one who had a daughter with breast cancer.  He also had four sisters, one who had breast cancer.  The paternal grandparents are deceased.  Carol Crawford is unaware of previous family history of genetic  testing for hereditary cancer risks. Patient's maternal ancestors are of African American descent, and paternal ancestors are of African American descent. There is no reported Ashkenazi Jewish ancestry. There is no known consanguinity.    GENETIC TEST RESULTS: Genetic testing reported out on November 25, 2018 through the Multi-cancer panel found no pathogenic mutations. The Multi-Gene Panel offered by [facility] includes sequencing and/or deletion duplication testing of the following 85 genes: AIP, ALK, APC, ATM, AXIN2,BAP1,  BARD1, BLM, BMPR1A, BRCA1, BRCA2, BRIP1, CASR, CDC73, CDH1, CDK4, CDKN1B, CDKN1C, CDKN2A (p14ARF), CDKN2A (p16INK4a), CEBPA, CHEK2, CTNNA1, DICER1, DIS3L2, EGFR (c.2369C>T, p.Thr790Met variant only), EPCAM (Deletion/duplication testing only), FH, FLCN, GATA2, GPC3, GREM1 (Promoter region deletion/duplication testing only), HOXB13 (c.251G>A, p.Gly84Glu), HRAS, KIT, MAX, MEN1, MET, MITF (c.952G>A, p.Glu318Lys variant only), MLH1, MSH2, MSH3, MSH6, MUTYH, NBN, NF1, NF2, NTHL1, PALB2, PDGFRA, PHOX2B, PMS2, POLD1, POLE, POT1, PRKAR1A, PTCH1, PTEN, RAD50, RAD51C, RAD51D, RB1, RECQL4, RET, RNF43, RUNX1, SDHAF2, SDHA (sequence changes only), SDHB, SDHC, SDHD, SMAD4, SMARCA4, SMARCB1, SMARCE1, STK11, SUFU, TERC, TERT, TMEM127, TP53, TSC1, TSC2, VHL, WRN and WT1.  The test report has been scanned into EPIC and is located under the Molecular Pathology section of the Results Review tab.  A portion of the result report is included below for reference.     We discussed with Carol Crawford that because current genetic testing is not perfect, it is possible there may be a gene mutation in one of these genes that current testing cannot detect, but that chance is small.  We also discussed, that there could be another gene that has not yet been discovered, or that we have not yet tested, that is responsible for the cancer diagnoses in the family. It is also possible there is a hereditary cause for the cancer in  the family that Carol Crawford did not inherit and therefore was not identified in her testing.  Therefore, it is important to remain in touch with cancer genetics in the future so that we can continue to offer Carol Crawford the most up to date genetic testing.   Genetic testing did identify a variant of uncertain significance (VUS) was identified in the [facility] gene called c.925G>A.  At this time, it is unknown if this variant is associated with increased cancer risk or if this is a normal finding, but most variants such as this get reclassified to being inconsequential. It should not be used to make medical management decisions. With time, we suspect the lab will determine the significance of this variant, if any. If we do learn more about it, we will try to contact Carol Crawford to discuss it further. However, it is important to stay in touch with [place] periodically and keep the address and phone number up to date.  ADDITIONAL GENETIC TESTING: We discussed with Carol Crawford that her genetic testing was fairly extensive.  If there are genes identified to increase cancer risk that can be analyzed in the future, we would be happy to discuss and coordinate this testing at that time.    CANCER SCREENING RECOMMENDATIONS: Carol Crawford test result is considered negative (normal).  This means that we have not identified a hereditary cause for her personal and family history of cancer at this time. Most cancers  happen by chance and this negative test suggests that her cancer may fall into this category.    While reassuring, this does not definitively rule out a hereditary predisposition to cancer. It is still possible that there could be genetic mutations that are undetectable by current technology. There could be genetic mutations in genes that have not been tested or identified to increase cancer risk.  Therefore, it is recommended she continue to follow the cancer management and screening guidelines provided by her oncology and primary  healthcare provider.   An individual's cancer risk and medical management are not determined by genetic test results alone. Overall cancer risk assessment incorporates additional factors, including personal medical history, family history, and any available genetic information that may result in a personalized plan for cancer prevention and surveillance  RECOMMENDATIONS FOR FAMILY MEMBERS:  Individuals in this family might be at some increased risk of developing cancer, over the general population risk, simply due to the family history of cancer.  We recommended women in this family have a yearly mammogram beginning at age 33, or 13 years younger than the earliest onset of cancer, an annual clinical breast exam, and perform monthly breast self-exams. Women in this family should also have a gynecological exam as recommended by their primary provider. All family members should have a colonoscopy by age 71.  FOLLOW-UP: Lastly, we discussed with Carol Crawford that cancer genetics is a rapidly advancing field and it is possible that new genetic tests will be appropriate for her and/or her family members in the future. We encouraged her to remain in contact with cancer genetics on an annual basis so we can update her personal and family histories and let her know of advances in cancer genetics that may benefit this family.   Our contact number was provided. Carol Crawford questions were answered to her satisfaction, and she knows she is welcome to call us at anytime with additional questions or concerns.   Roma Kayser, [place], [facility] Licensed, Certified Genetic Counselor [email or web address]

## 2018-11-26 NOTE — Telephone Encounter (Signed)
Revealed negative genetic testing.  Discussed that we do not know why she has breast cancer or why there is cancer in the family. It could be due to a different gene that we are not testing, or maybe our current technology may not be able to pick something up.  It will be important for her to keep in contact with genetics to keep up with whether additional testing may be needed.   SMARCA4 VUS identified.  This will not change her medical management.

## 2018-11-28 ENCOUNTER — Inpatient Hospital Stay (HOSPITAL_COMMUNITY): Admission: RE | Admit: 2018-11-28 | Payer: Medicaid Other | Source: Ambulatory Visit

## 2018-11-28 NOTE — Progress Notes (Signed)
Contacted patient about missed COVID appointment today, patient said that she got confused and also didn't have a ride.  The patient stated that she will come on 9/8 at 8 am and we will need to run a Rapid test because the patient will be having a seed placed the same day

## 2018-12-01 ENCOUNTER — Other Ambulatory Visit: Payer: Self-pay

## 2018-12-01 ENCOUNTER — Other Ambulatory Visit (HOSPITAL_COMMUNITY)
Admission: RE | Admit: 2018-12-01 | Discharge: 2018-12-01 | Disposition: A | Discharge status: Home / Self Care | Payer: Medicaid Other | Source: Ambulatory Visit | Attending: Orthopedic Surgery | Admitting: Orthopedic Surgery

## 2018-12-01 ENCOUNTER — Ambulatory Visit
Admission: RE | Admit: 2018-12-01 | Discharge: 2018-12-01 | Disposition: A | Discharge status: Home / Self Care | Payer: Medicaid Other | Source: Ambulatory Visit | Attending: General Surgery | Admitting: General Surgery

## 2018-12-01 DIAGNOSIS — Z01812 Encounter for preprocedural laboratory examination: Secondary | ICD-10-CM | POA: Insufficient documentation

## 2018-12-01 DIAGNOSIS — C50311 Malignant neoplasm of lower-inner quadrant of right female breast: Secondary | ICD-10-CM

## 2018-12-01 DIAGNOSIS — Z20828 Contact with and (suspected) exposure to other viral communicable diseases: Secondary | ICD-10-CM | POA: Diagnosis present

## 2018-12-01 DIAGNOSIS — Z170 Estrogen receptor positive status [ER+]: Secondary | ICD-10-CM

## 2018-12-01 LAB — SARS CORONAVIRUS 2 BY RT PCR (HOSPITAL ORDER, PERFORMED IN ~~LOC~~ HOSPITAL LAB): SARS Coronavirus 2: NEGATIVE

## 2018-12-01 NOTE — Progress Notes (Signed)

## 2018-12-02 ENCOUNTER — Ambulatory Visit (HOSPITAL_BASED_OUTPATIENT_CLINIC_OR_DEPARTMENT_OTHER)
Admission: RE | Admit: 2018-12-02 | Discharge: 2018-12-02 | Disposition: A | Discharge status: Home / Self Care | Payer: Medicaid Other | Attending: General Surgery | Admitting: General Surgery

## 2018-12-02 ENCOUNTER — Ambulatory Visit (HOSPITAL_BASED_OUTPATIENT_CLINIC_OR_DEPARTMENT_OTHER): Payer: Medicaid Other | Admitting: Anesthesiology

## 2018-12-02 ENCOUNTER — Ambulatory Visit
Admission: RE | Admit: 2018-12-02 | Discharge: 2018-12-02 | Disposition: A | Discharge status: Home / Self Care | Payer: Medicaid Other | Source: Ambulatory Visit | Attending: General Surgery | Admitting: General Surgery

## 2018-12-02 ENCOUNTER — Ambulatory Visit (HOSPITAL_COMMUNITY)
Admission: RE | Admit: 2018-12-02 | Discharge: 2018-12-02 | Disposition: A | Discharge status: Home / Self Care | Payer: Medicaid Other | Source: Ambulatory Visit | Attending: General Surgery | Admitting: General Surgery

## 2018-12-02 ENCOUNTER — Encounter (HOSPITAL_BASED_OUTPATIENT_CLINIC_OR_DEPARTMENT_OTHER): Payer: Self-pay | Admitting: *Deleted

## 2018-12-02 ENCOUNTER — Encounter (HOSPITAL_BASED_OUTPATIENT_CLINIC_OR_DEPARTMENT_OTHER)
Admission: RE | Disposition: A | Discharge status: Home / Self Care | Payer: Self-pay | Source: Home / Self Care | Attending: General Surgery

## 2018-12-02 ENCOUNTER — Other Ambulatory Visit: Payer: Self-pay

## 2018-12-02 DIAGNOSIS — Z8673 Personal history of transient ischemic attack (TIA), and cerebral infarction without residual deficits: Secondary | ICD-10-CM | POA: Insufficient documentation

## 2018-12-02 DIAGNOSIS — R921 Mammographic calcification found on diagnostic imaging of breast: Secondary | ICD-10-CM | POA: Diagnosis not present

## 2018-12-02 DIAGNOSIS — F329 Major depressive disorder, single episode, unspecified: Secondary | ICD-10-CM | POA: Diagnosis not present

## 2018-12-02 DIAGNOSIS — C50411 Malignant neoplasm of upper-outer quadrant of right female breast: Secondary | ICD-10-CM | POA: Insufficient documentation

## 2018-12-02 DIAGNOSIS — F419 Anxiety disorder, unspecified: Secondary | ICD-10-CM | POA: Insufficient documentation

## 2018-12-02 DIAGNOSIS — Z170 Estrogen receptor positive status [ER+]: Secondary | ICD-10-CM | POA: Diagnosis not present

## 2018-12-02 DIAGNOSIS — F17200 Nicotine dependence, unspecified, uncomplicated: Secondary | ICD-10-CM | POA: Diagnosis not present

## 2018-12-02 DIAGNOSIS — Z880 Allergy status to penicillin: Secondary | ICD-10-CM | POA: Diagnosis not present

## 2018-12-02 DIAGNOSIS — K219 Gastro-esophageal reflux disease without esophagitis: Secondary | ICD-10-CM | POA: Diagnosis not present

## 2018-12-02 DIAGNOSIS — C50311 Malignant neoplasm of lower-inner quadrant of right female breast: Secondary | ICD-10-CM

## 2018-12-02 DIAGNOSIS — Z800 Family history of malignant neoplasm of digestive organs: Secondary | ICD-10-CM | POA: Insufficient documentation

## 2018-12-02 DIAGNOSIS — Z801 Family history of malignant neoplasm of trachea, bronchus and lung: Secondary | ICD-10-CM | POA: Insufficient documentation

## 2018-12-02 HISTORY — PX: BREAST LUMPECTOMY: SHX2

## 2018-12-02 HISTORY — PX: BREAST LUMPECTOMY WITH RADIOACTIVE SEED AND SENTINEL LYMPH NODE BIOPSY: SHX6550

## 2018-12-02 HISTORY — DX: Personal history of Methicillin resistant Staphylococcus aureus infection: Z86.14

## 2018-12-02 LAB — POCT PREGNANCY, URINE: Preg Test, Ur: NEGATIVE

## 2018-12-02 SURGERY — BREAST LUMPECTOMY WITH RADIOACTIVE SEED AND SENTINEL LYMPH NODE BIOPSY
Anesthesia: General | Site: Breast | Laterality: Right

## 2018-12-02 MED ORDER — ONDANSETRON HCL 4 MG/2ML IJ SOLN
INTRAMUSCULAR | Status: DC | PRN
  Administered 2018-12-02: 4 mg via INTRAVENOUS

## 2018-12-02 MED ORDER — FENTANYL CITRATE (PF) 100 MCG/2ML IJ SOLN
INTRAMUSCULAR | Status: AC
  Filled 2018-12-02: qty 2

## 2018-12-02 MED ORDER — VANCOMYCIN HCL IN DEXTROSE 1-5 GM/200ML-% IV SOLN
1000.0000 mg | INTRAVENOUS | Status: AC
  Administered 2018-12-02: 1000 mg via INTRAVENOUS

## 2018-12-02 MED ORDER — BUPIVACAINE-EPINEPHRINE (PF) 0.25% -1:200000 IJ SOLN
INTRAMUSCULAR | Status: DC | PRN
  Administered 2018-12-02: 10 mL

## 2018-12-02 MED ORDER — HYDROCODONE-ACETAMINOPHEN 5-325 MG PO TABS: 1.0000 | ORAL_TABLET | Freq: Four times a day (QID) | ORAL | 0 refills | Status: DC | PRN

## 2018-12-02 MED ORDER — EPHEDRINE SULFATE 50 MG/ML IJ SOLN
INTRAMUSCULAR | Status: DC | PRN
  Administered 2018-12-02 (×2): 10 mg via INTRAVENOUS

## 2018-12-02 MED ORDER — BUPIVACAINE-EPINEPHRINE (PF) 0.5% -1:200000 IJ SOLN
INTRAMUSCULAR | Status: DC | PRN
  Administered 2018-12-02: 30 mL via PERINEURAL

## 2018-12-02 MED ORDER — GABAPENTIN 300 MG PO CAPS
300.0000 mg | ORAL_CAPSULE | ORAL | Status: AC
  Administered 2018-12-02: 300 mg via ORAL

## 2018-12-02 MED ORDER — CELECOXIB 200 MG PO CAPS
200.0000 mg | ORAL_CAPSULE | ORAL | Status: AC
  Administered 2018-12-02: 10:00:00 200 mg via ORAL

## 2018-12-02 MED ORDER — CHLORHEXIDINE GLUCONATE CLOTH 2 % EX PADS: 6.0000 | MEDICATED_PAD | Freq: Once | CUTANEOUS | Status: DC

## 2018-12-02 MED ORDER — DEXAMETHASONE SODIUM PHOSPHATE 4 MG/ML IJ SOLN
INTRAMUSCULAR | Status: DC | PRN
  Administered 2018-12-02: 10 mg via INTRAVENOUS

## 2018-12-02 MED ORDER — PROPOFOL 10 MG/ML IV BOLUS
INTRAVENOUS | Status: AC
  Filled 2018-12-02: qty 20

## 2018-12-02 MED ORDER — FENTANYL CITRATE (PF) 100 MCG/2ML IJ SOLN
25.0000 ug | INTRAMUSCULAR | Status: DC | PRN
  Administered 2018-12-02: 25 ug via INTRAVENOUS
  Administered 2018-12-02: 50 ug via INTRAVENOUS

## 2018-12-02 MED ORDER — ACETAMINOPHEN 500 MG PO TABS
ORAL_TABLET | ORAL | Status: AC
  Filled 2018-12-02: qty 2

## 2018-12-02 MED ORDER — MIDAZOLAM HCL 2 MG/2ML IJ SOLN
INTRAMUSCULAR | Status: AC
  Filled 2018-12-02: qty 2

## 2018-12-02 MED ORDER — FENTANYL CITRATE (PF) 100 MCG/2ML IJ SOLN
50.0000 ug | INTRAMUSCULAR | Status: DC | PRN
  Administered 2018-12-02: 11:00:00 100 ug via INTRAVENOUS
  Administered 2018-12-02: 12:00:00 50 ug via INTRAVENOUS

## 2018-12-02 MED ORDER — ACETAMINOPHEN 500 MG PO TABS
1000.0000 mg | ORAL_TABLET | ORAL | Status: AC
  Administered 2018-12-02: 1000 mg via ORAL

## 2018-12-02 MED ORDER — PROMETHAZINE HCL 25 MG/ML IJ SOLN: 6.2500 mg | INTRAMUSCULAR | Status: DC | PRN

## 2018-12-02 MED ORDER — OXYCODONE HCL 5 MG PO TABS
5.0000 mg | ORAL_TABLET | Freq: Once | ORAL | Status: AC
  Administered 2018-12-02: 14:00:00 5 mg via ORAL

## 2018-12-02 MED ORDER — MEPERIDINE HCL 25 MG/ML IJ SOLN: 6.2500 mg | INTRAMUSCULAR | Status: DC | PRN

## 2018-12-02 MED ORDER — LIDOCAINE 2% (20 MG/ML) 5 ML SYRINGE
INTRAMUSCULAR | Status: DC | PRN
  Administered 2018-12-02: 20 mg via INTRAVENOUS
  Administered 2018-12-02: 60 mg via INTRAVENOUS

## 2018-12-02 MED ORDER — LACTATED RINGERS IV SOLN
INTRAVENOUS | Status: DC
  Administered 2018-12-02: 10:00:00 via INTRAVENOUS

## 2018-12-02 MED ORDER — CELECOXIB 200 MG PO CAPS
ORAL_CAPSULE | ORAL | Status: AC
  Filled 2018-12-02: qty 1

## 2018-12-02 MED ORDER — VANCOMYCIN HCL IN DEXTROSE 1-5 GM/200ML-% IV SOLN
INTRAVENOUS | Status: AC
  Filled 2018-12-02: qty 200

## 2018-12-02 MED ORDER — LIDOCAINE 2% (20 MG/ML) 5 ML SYRINGE
INTRAMUSCULAR | Status: AC
  Filled 2018-12-02: qty 5

## 2018-12-02 MED ORDER — PROPOFOL 10 MG/ML IV BOLUS
INTRAVENOUS | Status: DC | PRN
  Administered 2018-12-02: 50 mg via INTRAVENOUS
  Administered 2018-12-02: 200 mg via INTRAVENOUS
  Administered 2018-12-02: 50 mg via INTRAVENOUS

## 2018-12-02 MED ORDER — SCOPOLAMINE 1 MG/3DAYS TD PT72: 1.0000 | MEDICATED_PATCH | Freq: Once | TRANSDERMAL | Status: DC

## 2018-12-02 MED ORDER — TECHNETIUM TC 99M SULFUR COLLOID FILTERED
1.0000 | Freq: Once | INTRAVENOUS | Status: AC | PRN
  Administered 2018-12-02: 1 via INTRADERMAL

## 2018-12-02 MED ORDER — DEXAMETHASONE SODIUM PHOSPHATE 10 MG/ML IJ SOLN
INTRAMUSCULAR | Status: AC
  Filled 2018-12-02: qty 1

## 2018-12-02 MED ORDER — MIDAZOLAM HCL 2 MG/2ML IJ SOLN
1.0000 mg | INTRAMUSCULAR | Status: DC | PRN
  Administered 2018-12-02 (×4): 1 mg via INTRAVENOUS

## 2018-12-02 MED ORDER — OXYCODONE HCL 5 MG PO TABS
ORAL_TABLET | ORAL | Status: AC
  Filled 2018-12-02: qty 1

## 2018-12-02 MED ORDER — MIDAZOLAM HCL 2 MG/2ML IJ SOLN: 0.5000 mg | Freq: Once | INTRAMUSCULAR | Status: DC | PRN

## 2018-12-02 MED ORDER — GABAPENTIN 300 MG PO CAPS
ORAL_CAPSULE | ORAL | Status: AC
  Filled 2018-12-02: qty 1

## 2018-12-02 MED ORDER — ONDANSETRON HCL 4 MG/2ML IJ SOLN
INTRAMUSCULAR | Status: AC
  Filled 2018-12-02: qty 2

## 2018-12-02 SURGICAL SUPPLY — 37 items
APPLIER CLIP 9.375 MED OPEN (MISCELLANEOUS) ×3
BLADE SURG 15 STRL LF DISP TIS (BLADE) ×1 IMPLANT
BLADE SURG 15 STRL SS (BLADE) ×2
CHLORAPREP W/TINT 26 (MISCELLANEOUS) ×3 IMPLANT
CLIP APPLIE 9.375 MED OPEN (MISCELLANEOUS) ×1 IMPLANT
COVER BACK TABLE REUSABLE LG (DRAPES) ×3 IMPLANT
COVER MAYO STAND REUSABLE (DRAPES) ×3 IMPLANT
COVER PROBE W GEL 5X96 (DRAPES) ×3 IMPLANT
DERMABOND ADVANCED (GAUZE/BANDAGES/DRESSINGS) ×2
DERMABOND ADVANCED .7 DNX12 (GAUZE/BANDAGES/DRESSINGS) ×1 IMPLANT
DRAPE LAPAROSCOPIC ABDOMINAL (DRAPES) ×3 IMPLANT
DRAPE UTILITY XL STRL (DRAPES) ×3 IMPLANT
ELECT COATED BLADE 2.86 ST (ELECTRODE) ×3 IMPLANT
ELECT REM PT RETURN 9FT ADLT (ELECTROSURGICAL) ×3
ELECTRODE REM PT RTRN 9FT ADLT (ELECTROSURGICAL) ×1 IMPLANT
GLOVE BIO SURGEON STRL SZ 6.5 (GLOVE) ×4 IMPLANT
GLOVE BIO SURGEON STRL SZ7.5 (GLOVE) ×3 IMPLANT
GLOVE BIO SURGEONS STRL SZ 6.5 (GLOVE) ×2
GLOVE BIOGEL PI IND STRL 6.5 (GLOVE) ×2 IMPLANT
GLOVE BIOGEL PI INDICATOR 6.5 (GLOVE) ×4
GOWN STRL REUS W/ TWL LRG LVL3 (GOWN DISPOSABLE) ×3 IMPLANT
GOWN STRL REUS W/TWL LRG LVL3 (GOWN DISPOSABLE) ×6
KIT MARKER MARGIN INK (KITS) ×3 IMPLANT
NEEDLE HYPO 25X1 1.5 SAFETY (NEEDLE) ×3 IMPLANT
NS IRRIG 1000ML POUR BTL (IV SOLUTION) ×3 IMPLANT
PACK BASIN DAY SURGERY FS (CUSTOM PROCEDURE TRAY) ×3 IMPLANT
PENCIL BUTTON HOLSTER BLD 10FT (ELECTRODE) ×3 IMPLANT
SLEEVE SCD COMPRESS KNEE MED (MISCELLANEOUS) ×3 IMPLANT
SPONGE LAP 18X18 RF (DISPOSABLE) ×3 IMPLANT
SUT MON AB 4-0 PC3 18 (SUTURE) ×6 IMPLANT
SUT VICRYL 3-0 CR8 SH (SUTURE) ×3 IMPLANT
SYR CONTROL 10ML LL (SYRINGE) ×3 IMPLANT
TOWEL GREEN STERILE FF (TOWEL DISPOSABLE) ×3 IMPLANT
TRAY FAXITRON CT DISP (TRAY / TRAY PROCEDURE) ×3 IMPLANT
TUBE CONNECTING 20'X1/4 (TUBING) ×1
TUBE CONNECTING 20X1/4 (TUBING) ×2 IMPLANT
YANKAUER SUCT BULB TIP NO VENT (SUCTIONS) ×3 IMPLANT

## 2018-12-02 NOTE — Transfer of Care (Signed)
Immediate Anesthesia Transfer of Care Note  Patient: Carol Crawford  Procedure(s) Performed: RIGHT BREAST LUMPECTOMY WITH RADIOACTIVE SEED AND SENTINEL LYMPH NODE BIOPSY (Right Breast)  Patient Location: PACU  Anesthesia Type:GA combined with regional for post-op pain  Level of Consciousness: sedated  Airway & Oxygen Therapy: Patient Spontanous Breathing and Patient connected to nasal cannula oxygen  Post-op Assessment: Report given to RN and Post -op Vital signs reviewed and stable  Post vital signs: Reviewed and stable  Last Vitals:  Vitals Value Taken Time  BP 126/82 12/02/18 1252  Temp    Pulse 79 12/02/18 1256  Resp 22 12/02/18 1256  SpO2 100 % 12/02/18 1256  Vitals shown include unvalidated device data.  Last Pain:  Vitals:   12/02/18 0942  TempSrc: Oral  PainSc: 4          Complications: No apparent anesthesia complications

## 2018-12-02 NOTE — H&P (Signed)
Carol Crawford  Location: [facility] Patient #: [id number] DOB: 03/04/1976 Single / Language: Carol Crawford / Race: Black or African American Female   History of Present Illness  The patient is a 43 year old female who presents with breast cancer. We are asked to see the patient in consultation by Dr. Grayland Ormond to evaluate her for a new right breast cancer. The patient is a 43 year old black female who was being followed for some calcifications in the upper outer quadrant of the right breast. These calcifications measured about 1.5 cm. The number of calcifications increased on the most recent mammogram which prompted a biopsy. The biopsy came back as a grade 1 invasive breast cancer that was ER and PR positive and HER-2 negative. She has no family history of breast cancer. She does have a family history of lung cancer in her father and colon cancer in both grandfathers. She is otherwise in pretty good health. She apparently had a mini stroke about a year ago when she was having some significant foot surgery   Past Surgical History Breast Biopsy  Right.  Diagnostic Studies History Mammogram  within last year Pap Smear  1-5 years ago  Allergies  Penicillins  Allergies Reconciled   Medication History  Tylenol ('325MG'$  Tablet, Oral) Active. Medications Reconciled  Social History  Alcohol use  Occasional alcohol use. Caffeine use  Coffee, Tea. Illicit drug use  Prefer to discuss with provider. Tobacco use  Current every day smoker.  Family History Alcohol Abuse  Brother, Father. Colon Cancer  Father. Depression  Mother, Sister. Heart disease in female family member before age 37  Hypertension  Mother. Respiratory Condition  Father.  Pregnancy / Birth History  Age at menarche  70 years. Gravida  2 Length (months) of breastfeeding  3-6 Maternal age  7-20 Para  2  Other Problems  Anxiety Disorder  Back Pain  Breast Cancer  Depression      Review of Systems  General Present- Appetite Loss and Night Sweats. Not Present- Chills, Fatigue, Fever, Weight Gain and Weight Loss. HEENT Present- Ringing in the Ears and Wears glasses/contact lenses. Not Present- Earache, Hearing Loss, Hoarseness, Nose Bleed, Oral Ulcers, Seasonal Allergies, Sinus Pain, Sore Throat, Visual Disturbances and Yellow Eyes. Cardiovascular Present- Swelling of Extremities. Not Present- Chest Pain, Difficulty Breathing Lying Down, Leg Cramps, Palpitations, Rapid Heart Rate and Shortness of Breath. Gastrointestinal Present- Hemorrhoids. Not Present- Abdominal Pain, Bloating, Bloody Stool, Change in Bowel Habits, Chronic diarrhea, Constipation, Difficulty Swallowing, Excessive gas, Gets full quickly at meals, Indigestion, Nausea, Rectal Pain and Vomiting. Female Genitourinary Present- Frequency. Not Present- Nocturia, Painful Urination, Pelvic Pain and Urgency. Musculoskeletal Present- Back Pain, Joint Pain, Joint Stiffness, Muscle Pain, Muscle Weakness and Swelling of Extremities. Neurological Present- Decreased Memory and Weakness. Not Present- Fainting, Headaches, Numbness, Seizures, Tingling, Tremor and Trouble walking. Psychiatric Present- Anxiety and Frequent crying. Not Present- Bipolar, Change in Sleep Pattern, Depression and Fearful. Endocrine Present- Hot flashes. Not Present- Cold Intolerance, Excessive Hunger, Hair Changes, Heat Intolerance and New Diabetes. Hematology Present- Easy Bruising. Not Present- Blood Thinners, Excessive bleeding, Gland problems, HIV and Persistent Infections.  Vitals Weight: 167.8 lb Height: 67in Body Surface Area: 1.88 m Body Mass Index: 26.28 kg/m  Temp.: 97.9F (Temporal)  Pulse: 99 (Regular)  BP: 116/72(Sitting, Left Arm, Standard)       Physical Exam General Mental Status-Alert. General Appearance-Consistent with stated age. Hydration-Well hydrated. Voice-Normal.  Head and  Neck Head-normocephalic, atraumatic with no lesions or palpable masses. Trachea-midline. Thyroid  Gland Characteristics - normal size and consistency.  Eye Eyeball - Bilateral-Extraocular movements intact. Sclera/Conjunctiva - Bilateral-No scleral icterus.  Chest and Lung Exam Chest and lung exam reveals -quiet, even and easy respiratory effort with no use of accessory muscles and on auscultation, normal breath sounds, no adventitious sounds and normal vocal resonance. Inspection Chest Wall - Normal. Back - normal.  Breast Note: She has symmetric dense breast tissue in the upper and upper outer portions of both breasts. There is no other significant palpable mass in either breast. There is no palpable axillary, supraclavicular, or cervical lymphadenopathy.   Cardiovascular Cardiovascular examination reveals -normal heart sounds, regular rate and rhythm with no murmurs and normal pedal pulses bilaterally.  Abdomen Inspection Inspection of the abdomen reveals - No Hernias. Skin - Scar - no surgical scars. Palpation/Percussion Palpation and Percussion of the abdomen reveal - Soft, Non Tender, No Rebound tenderness, No Rigidity (guarding) and No hepatosplenomegaly. Auscultation Auscultation of the abdomen reveals - Bowel sounds normal.  Neurologic Neurologic evaluation reveals -alert and oriented x 3 with no impairment of recent or remote memory. Mental Status-Normal.  Musculoskeletal Normal Exam - Left-Upper Extremity Strength Normal and Lower Extremity Strength Normal. Normal Exam - Right-Upper Extremity Strength Normal and Lower Extremity Strength Normal.  Lymphatic Head & Neck  General Head & Neck Lymphatics: Bilateral - Description - Normal. Axillary  General Axillary Region: Bilateral - Description - Normal. Tenderness - Non Tender. Femoral & Inguinal  Generalized Femoral & Inguinal Lymphatics: Bilateral - Description - Normal. Tenderness - Non  Tender.    Assessment & Plan   MALIGNANT NEOPLASM OF UPPER-OUTER QUADRANT OF RIGHT BREAST IN FEMALE, ESTROGEN RECEPTOR POSITIVE (C50.411) Impression: The patient appears to have a small stage I grade 1 cancer of the upper outer right breast. I have talked to her in detail about the different options for treatment and at this point she seems to favor breast conservation. I think this is a very reasonable way of treating her cancer. She is also a good candidate for sentinel node mapping. She would require a radioactive seed for localization. I have discussed with her in detail the risks and benefits of the operation as well as some the technical aspects and she understands and wishes to proceed. Given her age she may also need genetic testing. I will go ahead and refer her to medical and radiation oncology to talk about adjuvant therapy.  Current Plans Referred to Oncology, for evaluation and follow up (Oncology). Routine. Referred to Genetic Counseling, for evaluation and follow up [employer]). Routine. Pt Education - Breast Cancer: discussed with patient and provided information.

## 2018-12-02 NOTE — Progress Notes (Signed)
Assisted Dr. Daiva Huge with right, ultrasound guided, pectoralis block. Side rails up, monitors on throughout procedure. See vital signs in flow sheet. Tolerated Procedure well.

## 2018-12-02 NOTE — Interval H&P Note (Signed)
History and Physical Interval Note:  12/02/2018 10:45 AM  Carol Crawford  has presented today for surgery, with the diagnosis of RIGHT BREAST CANCER.  The various methods of treatment have been discussed with the patient and family. After consideration of risks, benefits and other options for treatment, the patient has consented to  Procedure(s): RIGHT BREAST LUMPECTOMY WITH RADIOACTIVE SEED AND SENTINEL LYMPH NODE BIOPSY (Right) as a surgical intervention.  The patient's history has been reviewed, patient examined, no change in status, stable for surgery.  I have reviewed the patient's chart and labs.  Questions were answered to the patient's satisfaction.     [name]

## 2018-12-02 NOTE — Interval H&P Note (Signed)
History and Physical Interval Note:  12/02/2018 10:46 AM  Carol Crawford  has presented today for surgery, with the diagnosis of RIGHT BREAST CANCER.  The various methods of treatment have been discussed with the patient and family. After consideration of risks, benefits and other options for treatment, the patient has consented to  Procedure(s): RIGHT BREAST LUMPECTOMY WITH RADIOACTIVE SEED AND SENTINEL LYMPH NODE BIOPSY (Right) as a surgical intervention.  The patient's history has been reviewed, patient examined, no change in status, stable for surgery.  I have reviewed the patient's chart and labs.  Questions were answered to the patient's satisfaction.     [name]

## 2018-12-02 NOTE — Discharge Instructions (Signed)
Next Dose of pain medication at 8:15 PM   Post Anesthesia Home Care Instructions  Activity: Get plenty of rest for the remainder of the day. A responsible individual must stay with you for 24 hours following the procedure.  For the next 24 hours, DO NOT: -Drive a car -Paediatric nurse -Drink alcoholic beverages -Take any medication unless instructed by your physician -Make any legal decisions or sign important papers.  Meals: Start with liquid foods such as gelatin or soup. Progress to regular foods as tolerated. Avoid greasy, spicy, heavy foods. If nausea and/or vomiting occur, drink only clear liquids until the nausea and/or vomiting subsides. Call your physician if vomiting continues.  Special Instructions/Symptoms: Your throat may feel dry or sore from the anesthesia or the breathing tube placed in your throat during surgery. If this causes discomfort, gargle with warm salt water. The discomfort should disappear within 24 hours.  If you had a scopolamine patch placed behind your ear for the management of post- operative nausea and/or vomiting:  1. The medication in the patch is effective for 72 hours, after which it should be removed.  Wrap patch in a tissue and discard in the trash. Wash hands thoroughly with soap and water. 2. You may remove the patch earlier than 72 hours if you experience unpleasant side effects which may include dry mouth, dizziness or visual disturbances. 3. Avoid touching the patch. Wash your hands with soap and water after contact with the patch.

## 2018-12-02 NOTE — Op Note (Signed)
12/02/2018  12:43 PM  PATIENT:  Carol Crawford  43 y.o. female  PRE-OPERATIVE DIAGNOSIS:  RIGHT BREAST CANCER  POST-OPERATIVE DIAGNOSIS:  RIGHT BREAST CANCER  PROCEDURE:  Procedure(s): RIGHT BREAST LUMPECTOMY WITH RADIOACTIVE SEED LOCALIZATION AND DEEP RIGHT AXILLARY SENTINEL LYMPH NODE BIOPSY (Right)  SURGEON:  Surgeon(s) and Role:    * Jovita Kussmaul, MD - Primary  PHYSICIAN ASSISTANT:   ASSISTANTS: none   ANESTHESIA:   local and general  EBL:  10 mL   BLOOD ADMINISTERED:none  DRAINS: none   LOCAL MEDICATIONS USED:  MARCAINE     SPECIMEN:  Source of Specimen:  right breast tissue with additiional lateral margin and sentinel nodes X 3  DISPOSITION OF SPECIMEN:  PATHOLOGY  COUNTS:  YES  TOURNIQUET:  * No tourniquets in log *  DICTATION: .Dragon Dictation   After informed consent was obtained the patient was brought to the operating room and placed in the supine position on the operating table.  After adequate induction of general anesthesia the patient's right chest, breast, and axillary area were prepped with ChloraPrep, allowed to dry, and draped in usual sterile manner.  An appropriate timeout was performed.  Previously an I-125 seed was placed in the upper outer quadrant of the right breast to mark an area of invasive breast cancer.  Also earlier in the day the patient underwent injection of 1 mCi of technetium sulfur colloid in the subareolar position on the right.  Using the neoprobe the position of the seed and the lymph nodes were checked.  They did not appear to be far from each other in the upper outer quadrant of the breast and axilla.  Because of this I made a curvilinear incision at the edge of the axilla with a 15 blade knife after infiltrating this area with quarter percent Marcaine.  The incision was carried through the skin and subcutaneous tissue sharply with electrocautery until the chest wall was encountered.  Dissection was then carried into the upper outer  quadrant of the right breast between the breast tissue and the chest wall.  Next the dissection was carried out in the upper outer quadrant of the right breast between the breast tissue and the subcutaneous fatty tissue and skin.  Once this was accomplished I then was able to remove a wedge of breast tissue around the radioactive seed while checking the area of radioactivity frequently.  Once the specimen was removed it was oriented with the appropriate paint colors.  A specimen radiograph was obtained that showed the clip and seed to be near the center of the specimen.  I did take an additional lateral margin.  This was marked appropriately and also sent to pathology.  Next the dissection was carried into the deep right axillary space under the direction of the neoprobe.  Using the neoprobe to direct blunt hemostat dissection I was able to identify 3 lymph nodes.  2 had increased radioactivity and one was palpable.  These were all excised sharply with the electrocautery and the surrounding lymphatics and small vessels were controlled with clips.  Ex vivo counts on the 2 nodes with radioactivity ranged from 50 to 300.  All 3 nodes were sent as sentinel nodes to pathology.  Hemostasis was achieved using the Bovie electrocautery.  The wound was irrigated with saline and infiltrated with more quarter percent Marcaine.  The lumpectomy cavity was marked with clips.  The axilla was then closed with interrupted 3-0 Vicryl stitches.  The lumpectomy cavity was then  also closed with layers of interrupted 3-0 Vicryl stitches.  The skin was then closed with a running 4-0 Monocryl subcuticular stitch.  Dermabond dressings were applied.  The patient tolerated the procedure well.  At the end of the case all needle sponge and instrument counts were correct.  The patient was then awakened and taken to recovery in stable condition.  PLAN OF CARE: Discharge to home after PACU  PATIENT DISPOSITION:  PACU - hemodynamically stable.    Delay start of Pharmacological VTE agent (>24hrs) due to surgical blood loss or risk of bleeding: not applicable

## 2018-12-02 NOTE — Anesthesia Postprocedure Evaluation (Signed)
Anesthesia Post Note  Patient: Carol Crawford  Procedure(s) Performed: RIGHT BREAST LUMPECTOMY WITH RADIOACTIVE SEED AND SENTINEL LYMPH NODE BIOPSY (Right Breast)     Patient location during evaluation: PACU Anesthesia Type: General Level of consciousness: awake and alert and oriented Pain management: pain level controlled Vital Signs Assessment: post-procedure vital signs reviewed and stable Respiratory status: spontaneous breathing, nonlabored ventilation and respiratory function stable Cardiovascular status: blood pressure returned to baseline Postop Assessment: no apparent nausea or vomiting Anesthetic complications: no               Brennan Bailey

## 2018-12-02 NOTE — Anesthesia Procedure Notes (Signed)
Anesthesia Regional Block: Pectoralis block   Pre-Anesthetic Checklist: ,, timeout performed, Correct Patient, Correct Site, Correct Laterality, Correct Procedure, Correct Position, site marked, Risks and benefits discussed, pre-op evaluation,  At surgeon's request and post-op pain management  Laterality: Right  Prep: Maximum Sterile Barrier Precautions used, chloraprep       Needles:  Injection technique: Single-shot  Needle Type: Echogenic Stimulator Needle     Needle Length: 9cm  Needle Gauge: 22     Additional Needles:   Procedures:,,,, ultrasound used (permanent image in chart),,,,  Narrative:  Start time: 12/02/2018 10:56 AM End time: 12/02/2018 10:58 AM Injection made incrementally with aspirations every 5 mL.  Performed by: Personally  Anesthesiologist: Brennan Bailey, MD  Additional Notes: Risks, benefits, and alternative discussed. Patient gave consent for procedure. Patient prepped and draped in sterile fashion. Sedation administered, patient remains easily responsive to voice. Relevant anatomy identified with ultrasound guidance. Local anesthetic given in 5cc increments with no signs or symptoms of intravascular injection. No pain or paraesthesias with injection. Patient monitored throughout procedure with signs of LAST or immediate complications. Tolerated well. Ultrasound image placed in chart.  Tawny Asal, MD

## 2018-12-02 NOTE — Anesthesia Preprocedure Evaluation (Addendum)
Anesthesia Evaluation  Patient identified by MRN, date of birth, ID band Patient awake    Reviewed: Allergy & Precautions, NPO status , Patient's Chart, lab work & pertinent test results  History of Anesthesia Complications Negative for: history of anesthetic complications  Airway Mallampati: II  TM Distance: >3 FB Neck ROM: Full    Dental  (+) Dental Advisory Given, Missing   Pulmonary Current Smoker and Patient abstained from smoking.,  12/01/2018 SARS coronavirus NEG   breath sounds clear to auscultation       Cardiovascular negative cardio ROS   Rhythm:Regular Rate:Normal  '18 ECHO: normal LVF, normal valves   Neuro/Psych Depression Bipolar Disorder Schizophrenia CVA, No Residual Symptoms    GI/Hepatic Neg liver ROS, GERD  Controlled,  Endo/Other  negative endocrine ROS  Renal/GU negative Renal ROS     Musculoskeletal   Abdominal   Peds  Hematology negative hematology ROS (+)   Anesthesia Other Findings   Reproductive/Obstetrics                            Anesthesia Physical Anesthesia Plan  ASA: III  Anesthesia Plan: General   Post-op Pain Management: GA combined w/ Regional for post-op pain   Induction: Intravenous  PONV Risk Score and Plan: 2 and Ondansetron, Dexamethasone, Treatment may vary due to age or medical condition and Midazolam  Airway Management Planned: LMA  Additional Equipment:   Intra-op Plan:   Post-operative Plan: Extubation in OR  Informed Consent: I have reviewed the patients History and Physical, chart, labs and discussed the procedure including the risks, benefits and alternatives for the proposed anesthesia with the patient or authorized representative who has indicated his/her understanding and acceptance.     Dental advisory given  Plan Discussed with: CRNA and Surgeon  Anesthesia Plan Comments: (Plan routine monitors, GA with pec block for  post op analgesia)      Anesthesia Quick Evaluation

## 2018-12-02 NOTE — Anesthesia Procedure Notes (Signed)
Procedure Name: Intubation Date/Time: 12/02/2018 11:27 AM Performed by: Maryella Shivers, CRNA Pre-anesthesia Checklist: Patient identified, Emergency Drugs available, Suction available and Patient being monitored Patient Re-evaluated:Patient Re-evaluated prior to induction Oxygen Delivery Method: Circle system utilized Preoxygenation: Pre-oxygenation with 100% oxygen Induction Type: IV induction Ventilation: Mask ventilation without difficulty Laryngoscope Size: Mac and 3 Grade View: Grade I Tube type: Oral Tube size: 7.0 mm Number of attempts: 1 Airway Equipment and Method: Stylet and Oral airway Placement Confirmation: ETT inserted through vocal cords under direct vision,  positive ETCO2 and breath sounds checked- equal and bilateral Secured at: 21 cm Tube secured with: Tape Dental Injury: Teeth and Oropharynx as per pre-operative assessment

## 2018-12-03 ENCOUNTER — Encounter (HOSPITAL_BASED_OUTPATIENT_CLINIC_OR_DEPARTMENT_OTHER): Payer: Self-pay | Admitting: General Surgery

## 2018-12-07 ENCOUNTER — Telehealth: Payer: Self-pay | Admitting: *Deleted

## 2018-12-07 NOTE — Telephone Encounter (Signed)
Received order for oncoytpe testing. Requisition faxed to pathology and [facility] 

## 2018-12-08 NOTE — Progress Notes (Signed)
HEMATOLOGY-ONCOLOGY DOXIMITY VISIT PROGRESS NOTE  I connected with Carol Crawford on 12/09/2018 at 10:15 AM EDT by Doximity video conference and verified that I am speaking with the correct person using two identifiers.  I discussed the limitations, risks, security and privacy concerns of performing an evaluation and management service by Doximity and the availability of in person appointments.  I also discussed with the patient that there may be a patient responsible charge related to this service. The patient expressed understanding and agreed to proceed.  Patient's Location: Home Physician Location: Clinic  Because of technology issues we were unable to connect through video chat.  I conducted the visit through audio conference.  CHIEF COMPLIANT: Follow-up s/p lumpectomy to review pathology   INTERVAL HISTORY: Carol Crawford is a 43 y.o. female with above-mentioned history of right breast cancer. Genetic testing was negative. She underwent a lumpectomy on 12/02/18 with Dr. [name] for which pathology confirmed invasive ductal carcinoma with DCIS, grade 1, 1.4cm, 4 axillary lymph nodes negative for carcinoma. She presents over Doximity today to review the pathology report and discuss further treatment.  Patient is complaining of a lot of pain in the axilla and what appears to be a seroma.  She has an appointment this Friday with the central current surgery to possibly drain the seroma.  Oncology History  Malignant neoplasm of upper-outer quadrant of right breast in female, estrogen receptor positive ([place])  10/21/2018 Initial Diagnosis   76-monthfollow-up of breast calcifications showed calcifications increased in size, spanning 1.5cm, with no discrete mass. Biopsy on 10/21/18 showed invasive mammary carcinoma, grade 1, HER-2 negative (0), ER+ 51-90%, PR+ >90% [facility]   11/10/2018 Cancer Staging   Staging form: Breast, AJCC 8th Edition - Clinical stage from 11/10/2018: Stage IA (cT1c, cN0,  cM0, G1, ER+, PR+, HER2-) - Signed by GNicholas Lose MD on 11/10/2018   11/25/2018 Genetic Testing   Negative genetic testing on a STAT panel.  The STAT Breast cancer panel offered by [facility] includes sequencing and rearrangement analysis for the following 9 genes:  ATM, BRCA1, BRCA2, CDH1, CHEK2, PALB2, PTEN, STK11 and TP53.   The report date is 11/18/2018.    SMARCA4 c.925C>A VUS identified on the multicancer panel.  The Multi-Gene Panel offered by [facility] includes sequencing and/or deletion duplication testing of the following 85 genes: AIP, ALK, APC, ATM, AXIN2,BAP1,  BARD1, BLM, BMPR1A, BRCA1, BRCA2, BRIP1, CASR, CDC73, CDH1, CDK4, CDKN1B, CDKN1C, CDKN2A (p14ARF), CDKN2A (p16INK4a), CEBPA, CHEK2, CTNNA1, DICER1, DIS3L2, EGFR (c.2369C>T, p.Thr790Met variant only), EPCAM (Deletion/duplication testing only), FH, FLCN, GATA2, GPC3, GREM1 (Promoter region deletion/duplication testing only), HOXB13 (c.251G>A, p.Gly84Glu), HRAS, KIT, MAX, MEN1, MET, MITF (c.952G>A, p.Glu318Lys variant only), MLH1, MSH2, MSH3, MSH6, MUTYH, NBN, NF1, NF2, NTHL1, PALB2, PDGFRA, PHOX2B, PMS2, POLD1, POLE, POT1, PRKAR1A, PTCH1, PTEN, RAD50, RAD51C, RAD51D, RB1, RECQL4, RET, RNF43, RUNX1, SDHAF2, SDHA (sequence changes only), SDHB, SDHC, SDHD, SMAD4, SMARCA4, SMARCB1, SMARCE1, STK11, SUFU, TERC, TERT, TMEM127, TP53, TSC1, TSC2, VHL, WRN and WT1.  The report date is November 25, 2018.   12/02/2018 Surgery   Right lumpectomy ([name]: IDC with DCIS, grade 1, 1.4cm, 4 axillary lymph nodes negative     REVIEW OF SYSTEMS:   Constitutional: Denies fevers, chills or abnormal weight loss Eyes: Denies blurriness of vision Ears, nose, mouth, throat, and face: Denies mucositis or sore throat Respiratory: Denies cough, dyspnea or wheezes Cardiovascular: Denies palpitation, chest discomfort Gastrointestinal:  Denies nausea, heartburn or change in bowel habits Skin: Denies abnormal skin rashes Lymphatics: Denies  new lymphadenopathy or easy  bruising Neurological:Denies numbness, tingling or new weaknesses Behavioral/Psych: Mood is stable, no new changes  Extremities: No lower extremity edema Breast: s/p right lumpectomy, possible seroma in the right axilla All other systems were reviewed with the patient and are negative.  Observations/Objective:  There were no vitals filed for this visit. There is no height or weight on file to calculate BMI.  I have reviewed the data as listed CMP Latest Ref Rng & Units 03/31/2018 01/09/2018 08/18/2017  Glucose 70 - 99 mg/dL 108(H) 101(H) 95  BUN 6 - 20 mg/dL 9 8 10   Creatinine 0.44 - 1.00 mg/dL 1.09(H) 0.78 0.99  Sodium 135 - 145 mmol/L 138 139 140  Potassium 3.5 - 5.1 mmol/L 3.6 3.9 3.4(L)  Chloride 98 - 111 mmol/L 105 107 111  CO2 22 - 32 mmol/L 27 25 21(L)  Calcium 8.9 - 10.3 mg/dL 8.9 8.8(L) 9.3  Total Protein 6.5 - 8.1 g/dL 7.3 6.8 -  Total Bilirubin 0.3 - 1.2 mg/dL 0.5 0.5 -  Alkaline Phos 38 - 126 U/L 78 64 -  AST 15 - 41 U/L 22 25 -  ALT 0 - 44 U/L 20 20 -    Lab Results  Component Value Date   WBC 7.2 03/31/2018   HGB 14.2 03/31/2018   HCT 41.7 03/31/2018   MCV 95.2 03/31/2018   PLT 322 03/31/2018   NEUTROABS 3.1 03/31/2018      Assessment Plan:  Malignant neoplasm of upper-outer quadrant of right breast in female, estrogen receptor positive ([place]) 12/02/2018:Right lumpectomy [name]): IDC with DCIS, grade 1, 1.4cm, 4 axillary lymph nodes negative HER-2 negative (0), ER+ 51-90%, PR+ >90% (done at [facility])  Pathology counseling: I discussed the final pathology report of the patient provided  a copy of this report. I discussed the margins as well as lymph node surgeries. We also discussed the final staging along with previously performed ER/PR and HER-2/neu testing.  Treatment plan: 1.  Oncotype DX to determine if she would benefit from chemo. 2.  Adjuvant radiation therapy 3.  Followed by adjuvant antiestrogen therapy  Return to clinic based upon Oncotype DX test result   I discussed the assessment and treatment plan with the patient. The patient was provided an opportunity to ask questions and all were answered. The patient agreed with the plan and demonstrated an understanding of the instructions. The patient was advised to call back or seek an in-person evaluation if the symptoms worsen or if the condition fails to improve as anticipated.   I provided 15 minutes of face-to-face Doximity time during this encounter.    Rulon Eisenmenger, MD 12/09/2018   I, Molly Dorshimer, am acting as scribe for Nicholas Lose, MD.  I have reviewed the above documentation for accuracy and completeness, and I agree with the above.

## 2018-12-09 ENCOUNTER — Inpatient Hospital Stay: Payer: Medicaid Other | Attending: Hematology and Oncology | Admitting: Hematology and Oncology

## 2018-12-09 DIAGNOSIS — Z170 Estrogen receptor positive status [ER+]: Secondary | ICD-10-CM | POA: Diagnosis not present

## 2018-12-09 DIAGNOSIS — C50411 Malignant neoplasm of upper-outer quadrant of right female breast: Secondary | ICD-10-CM

## 2018-12-09 NOTE — Assessment & Plan Note (Signed)
12/02/2018:Right lumpectomy [name]): IDC with DCIS, grade 1, 1.4cm, 4 axillary lymph nodes negative HER-2 negative (0), ER+ 51-90%, PR+ >90% (done at [facility])  Pathology counseling: I discussed the final pathology report of the patient provided  a copy of this report. I discussed the margins as well as lymph node surgeries. We also discussed the final staging along with previously performed ER/PR and HER-2/neu testing.  Treatment plan: 1.  Oncotype DX to determine if she would benefit from chemo. 2.  Adjuvant radiation therapy 3.  Followed by adjuvant antiestrogen therapy  Return to clinic based upon Oncotype DX test result

## 2018-12-10 ENCOUNTER — Encounter (HOSPITAL_COMMUNITY): Payer: Self-pay

## 2018-12-10 ENCOUNTER — Emergency Department (HOSPITAL_COMMUNITY)
Admission: EM | Admit: 2018-12-10 | Discharge: 2018-12-10 | Disposition: A | Discharge status: Home / Self Care | Payer: Medicaid Other | Attending: Emergency Medicine | Admitting: Emergency Medicine

## 2018-12-10 DIAGNOSIS — Z170 Estrogen receptor positive status [ER+]: Secondary | ICD-10-CM | POA: Diagnosis not present

## 2018-12-10 DIAGNOSIS — N644 Mastodynia: Secondary | ICD-10-CM | POA: Diagnosis present

## 2018-12-10 DIAGNOSIS — F17210 Nicotine dependence, cigarettes, uncomplicated: Secondary | ICD-10-CM | POA: Diagnosis not present

## 2018-12-10 DIAGNOSIS — Z8673 Personal history of transient ischemic attack (TIA), and cerebral infarction without residual deficits: Secondary | ICD-10-CM | POA: Diagnosis not present

## 2018-12-10 DIAGNOSIS — N6489 Other specified disorders of breast: Secondary | ICD-10-CM

## 2018-12-10 DIAGNOSIS — L7682 Other postprocedural complications of skin and subcutaneous tissue: Secondary | ICD-10-CM | POA: Insufficient documentation

## 2018-12-10 DIAGNOSIS — Z79899 Other long term (current) drug therapy: Secondary | ICD-10-CM | POA: Insufficient documentation

## 2018-12-10 DIAGNOSIS — C50411 Malignant neoplasm of upper-outer quadrant of right female breast: Secondary | ICD-10-CM | POA: Insufficient documentation

## 2018-12-10 LAB — CBC WITH DIFFERENTIAL/PLATELET
Abs Immature Granulocytes: 0 10*3/uL (ref 0.00–0.07)
Basophils Absolute: 0 10*3/uL (ref 0.0–0.1)
Basophils Relative: 1 %
Eosinophils Absolute: 0.1 10*3/uL (ref 0.0–0.5)
Eosinophils Relative: 1 %
HCT: 42 % (ref 36.0–46.0)
Hemoglobin: 13.9 g/dL (ref 12.0–15.0)
Immature Granulocytes: 0 %
Lymphocytes Relative: 60 %
Lymphs Abs: 2.7 10*3/uL (ref 0.7–4.0)
MCH: 32.6 pg (ref 26.0–34.0)
MCHC: 33.1 g/dL (ref 30.0–36.0)
MCV: 98.4 fL (ref 80.0–100.0)
Monocytes Absolute: 0.2 10*3/uL (ref 0.1–1.0)
Monocytes Relative: 5 %
Neutro Abs: 1.5 10*3/uL — ABNORMAL LOW (ref 1.7–7.7)
Neutrophils Relative %: 33 %
Platelets: 257 10*3/uL (ref 150–400)
RBC: 4.27 MIL/uL (ref 3.87–5.11)
RDW: 12.6 % (ref 11.5–15.5)
WBC: 4.4 10*3/uL (ref 4.0–10.5)
nRBC: 0 % (ref 0.0–0.2)

## 2018-12-10 LAB — BASIC METABOLIC PANEL
Anion gap: 8 (ref 5–15)
BUN: 11 mg/dL (ref 6–20)
CO2: 25 mmol/L (ref 22–32)
Calcium: 9.5 mg/dL (ref 8.9–10.3)
Chloride: 107 mmol/L (ref 98–111)
Creatinine, Ser: 0.78 mg/dL (ref 0.44–1.00)
GFR calc Af Amer: >60 mL/min (ref >60)
GFR calc non Af Amer: >60 mL/min (ref >60)
Glucose, Bld: 101 mg/dL — ABNORMAL HIGH (ref 70–99)
Potassium: 3.7 mmol/L (ref 3.5–5.1)
Sodium: 140 mmol/L (ref 135–145)

## 2018-12-10 MED ORDER — MORPHINE SULFATE (PF) 4 MG/ML IV SOLN
4.0000 mg | Freq: Once | INTRAVENOUS | Status: AC
  Administered 2018-12-10: 4 mg via INTRAVENOUS
  Filled 2018-12-10: qty 1

## 2018-12-10 NOTE — ED Provider Notes (Signed)
[place] DEPT Provider Note   CSN: [id number] Arrival date & time: 12/10/18  1319     History   Chief Complaint Chief Complaint  Patient presents with   Breast Pain    HPI Carol Crawford is a 43 y.o. female with history of breast cancer, migraine, bipolar, schizophrenia, CVA, GERD.  Patient underwent right breast lumpectomy with radioactive seed and sentinel lymph node biopsy on 12/02/2018.  She reports that 2 days later she developed some swelling and pain just superior to her right axillary incisional site.  She reports that symptoms have progressed since that time.  She describes a severe in intensity constant sharp throbbing sensation worsened with movement of the arm and palpation and without alleviating factors or radiation.  She was seen by her primary oncologist yesterday and provided with pain medication oxycodone which she has been taking without relief.  Patient reports that she has a follow-up appointment with [facility] surgery tomorrow regarding her problem.  She denies fever/chills, headache, neck pain, chest pain/shortness of breath, abdominal pain, nausea/vomiting, diarrhea, numbness/weakness, tingling or any additional concerns.    HPI  Past Medical History:  Diagnosis Date   ADHD    Bipolar 1 disorder ([place])    Depressed    Drug overdose 2010   Family history of breast cancer    Family history of colon cancer    Family history of prostate cancer    GERD (gastroesophageal reflux disease)    OCC   Headache    MIGRAINES   History of MRSA infection    foot infection   Migraine    Schizophrenia ([place])    Stroke ([place])    after foot surgery, left side was numb, fully recovered    Patient Active Problem List   Diagnosis Date Noted   Genetic testing 11/26/2018   Family history of breast cancer    Family history of prostate cancer    Family history of colon cancer    Malignant neoplasm of upper-outer  quadrant of right breast in female, estrogen receptor positive ([place]) 11/10/2018   Acute ischemic stroke ([place])    Left sided numbness 01/09/2018   Depressed    Bipolar 1 disorder ([place])     Past Surgical History:  Procedure Laterality Date   BREAST BIOPSY Right 10/21/2018   Affirm Bx-"X" clip-path pending   BREAST LUMPECTOMY WITH RADIOACTIVE SEED AND SENTINEL LYMPH NODE BIOPSY Right 12/02/2018   Procedure: RIGHT BREAST LUMPECTOMY WITH RADIOACTIVE SEED AND SENTINEL LYMPH NODE BIOPSY;  Surgeon: Jovita Kussmaul, MD;  Location: [place];  Service: General;  Laterality: Right;   INCISION AND DRAINAGE Left 01/09/2018   Procedure: INCISION AND DRAINAGE-below fascia foot; multiple;  Surgeon: [name], DPM;  Location: [facility];  Service: Podiatry;  Laterality: Left;   INCISION AND DRAINAGE Left 04/17/2018   Procedure: I&D; BELOW FASCIA FOOT; MULTIPLE;  Surgeon: [name], DPM;  Location: [facility];  Service: Podiatry;  Laterality: Left;   REPAIR EXTENSOR TENDON Left 09/05/2017   Procedure: REPAIR EXTENSOR TENDON;  Surgeon: [name], DPM;  Location: [facility];  Service: Podiatry;  Laterality: Left;     OB History   No obstetric history on file.      Home Medications    Prior to Admission medications   Medication Sig Start Date End Date Taking? Authorizing Provider  aspirin 81 MG chewable tablet Chew 1 tablet (81 mg total) by mouth daily. Patient taking differently: Chew 81 mg by mouth  at bedtime as needed for moderate pain.  01/11/18  Yes Dustin Flock, MD  chlorhexidine (PERIDEX) 0.12 % solution 15 mLs by Mouth Rinse route 2 (two) times daily. 10/06/18  Yes [provider]  doxycycline (VIBRA-TABS) 100 MG tablet Take 100 mg by mouth 2 (two) times daily. 12/07/18  Yes [provider]  HYDROcodone-acetaminophen (NORCO/VICODIN) 5-325 MG tablet Take 1-2 tablets by mouth every 6 (six) hours as needed for moderate pain or severe pain. Patient  taking differently: Take 1-2 tablets by mouth every 4 (four) hours as needed for moderate pain or severe pain.  12/02/18  Yes [name], MD  ibuprofen (ADVIL,MOTRIN) 600 MG tablet Take 600 mg by mouth every 6 (six) hours as needed.   Yes [provider]  medroxyPROGESTERone (DEPO-PROVERA) 150 MG/ML injection Inject 150 mg into the muscle every 3 (three) months.   Yes [provider]  mupirocin ointment (BACTROBAN) 2 % Place 1 application into the nose 2 (two) times daily. 10/06/18  Yes [provider]  oxyCODONE (ROXICODONE) 5 MG immediate release tablet Take 1 tablet (5 mg total) by mouth every 8 (eight) hours as needed. Patient taking differently: Take 5 mg by mouth See admin instructions. Takes every 4-6 hours as needed for pain. 11/25/18 11/25/19 Yes Triplett, Cari B, FNP  vitamin E 100 UNIT capsule Take by mouth daily.   Yes [provider]    Family History Family History  Problem Relation Age of Onset   Arthritis Mother    Hypertension Mother    Cervical cancer Mother    Prostate cancer Father    Lung cancer Father    Diabetes Paternal Aunt    Breast cancer Paternal 26    ADD / ADHD Daughter    Bipolar disorder Daughter    Hypertension Maternal Grandmother    Stroke Maternal Grandmother    Prostate cancer Maternal Grandfather    Hypertension Maternal Grandfather    Colon cancer Maternal Grandfather    Hypertension Paternal Grandfather    Stroke Paternal Grandfather    Breast cancer Cousin     Social History Social History   Tobacco Use   Smoking status: Current Every Day Smoker    Packs/day: 0.50    Years: 23.00    Pack years: 11.50    Types: Cigarettes    Last attempt to quit: 08/22/2017    Years since quitting: 1.3   Smokeless tobacco: Never Used  Substance Use Topics   Alcohol use: Yes    Comment: occasionally   Drug use: Yes    Types: Marijuana    Comment: 1-2x week     Allergies   Shrimp  [shellfish allergy], Penicillins, and Tomato   Review of Systems Review of Systems Ten systems are reviewed and are negative for acute change except as noted in the HPI   Physical Exam Updated Vital Signs BP 114/78 (BP Location: Left Arm)    Pulse 63    Temp 98.4 F (36.9 C) (Oral)    Resp 18    Ht 5\' 7"  (1.702 m)    Wt 75.8 kg    SpO2 100%    BMI 26.16 kg/m   Physical Exam Constitutional:      General: She is not in acute distress.    Appearance: Normal appearance. She is well-developed. She is not ill-appearing or diaphoretic.  HENT:     Head: Normocephalic and atraumatic.     Right Ear: External ear normal.     Left Ear:  External ear normal.     Nose: Nose normal.  Eyes:     General: Vision grossly intact. Gaze aligned appropriately.     Pupils: Pupils are equal, round, and reactive to light.  Neck:     Musculoskeletal: Normal range of motion.     Trachea: Trachea and phonation normal. No tracheal deviation.  Pulmonary:     Effort: Pulmonary effort is normal. No respiratory distress.  Abdominal:     General: There is no distension.     Palpations: Abdomen is soft.     Tenderness: There is no abdominal tenderness. There is no guarding or rebound.  Musculoskeletal: Normal range of motion.  Skin:    General: Skin is warm and dry.     Comments: Well-healing incision of the right axilla.  Just inferior to the incision there is a 2 cm x 3 cm area of mild erythema that is nontender.  Directly superior to the incisional site there is approximately a 5 cm in diameter tender area of fluctuance without overlying skin changes.  Neurological:     Mental Status: She is alert.     GCS: GCS eye subscore is 4. GCS verbal subscore is 5. GCS motor subscore is 6.     Comments: Speech is clear and goal oriented, follows commands Major Cranial nerves without deficit, no facial droop Moves extremities without ataxia, coordination intact  Psychiatric:        Behavior: Behavior normal.     ED Treatments / Results  Labs (all labs ordered are listed, but only abnormal results are displayed) Labs Reviewed  CBC WITH DIFFERENTIAL/PLATELET - Abnormal; Notable for the following components:      Result Value   Neutro Abs 1.5 (*)    All other components within normal limits  BASIC METABOLIC PANEL - Abnormal; Notable for the following components:   Glucose, Bld 101 (*)    All other components within normal limits    EKG None  Radiology No results found.  Procedures Procedures (including critical care time)  Medications Ordered in ED Medications  morphine 4 MG/ML injection 4 mg (4 mg Intravenous Given 12/10/18 1433)     Initial Impression / Assessment and Plan / ED Course  I have reviewed the triage vital signs and the nursing notes.  Pertinent labs & imaging results that were available during my care of the patient were reviewed by me and considered in my medical decision making (see chart for details).  Clinical Course as of Dec 09 1641  Thu Dec 10, 2018  1527 General surgery Jerene Pitch; general surgery reports they aspirated approximately 60 cc of yellow fluid from seroma.  They advise discharge and have scheduled patient with follow-up appointment next week in their office.   [BM]    Clinical Course User Index [BM] Deliah Boston, PA-C       43 year old female presents today with pain and swelling following lumpectomy, radioactive seed placement and lymph node biopsy 8 days ago. Superior to her incisional site she has a significant amount of swelling to this area that is acutely tender, somewhat fluctuant but without overlying skin changes.  NVI right upper extremity.  Review of virtual visit with oncologist from yesterday question whether patient is developing a seroma, she is scheduled to see [place] surgery tomorrow for further evaluation of this area.  Plan of care at this time is pain control, basic blood work and consult to general surgery to see  if they would like to see  the patient today instead of tomorrow. - CBC nonacute BMP nonacute - Discussed case with on-call general surgery team who will be seeing patient in ER. - Received call from [facility] from general surgery team, she reports they aspirated approximately 60 cc of clear yellow fluid from patient's seroma.  They advise discharge and have scheduled patient follow-up visit next week in the office.  No indication for antibiotics at this time. - Patient reevaluated resting comfortably no acute distress states great improvement of pain following drainage today.  She states understanding of plan of care and to follow-up next week with general surgery for reevaluation.  She has pain medication provided to her by her oncologist that she may use and does not need any refills today.  At this time there does not appear to be any evidence of an acute emergency medical condition and the patient appears stable for discharge with appropriate outpatient follow up. Diagnosis was discussed with patient who verbalizes understanding of care plan and is agreeable to discharge. I have discussed return precautions with patient who verbalizes understanding of return precautions. Patient encouraged to follow-up with their PCP and general surgery.  All questions answered.  Patient has been discharged in good condition.   Note: Portions of this report may have been transcribed using voice recognition software. Every effort was made to ensure accuracy; however, inadvertent computerized transcription errors may still be present. Final Clinical Impressions(s) / ED Diagnoses   Final diagnoses:  Seroma of breast    ED Discharge Orders    None       Gari Crown 12/10/18 1645    Charlesetta Shanks, MD 12/10/18 1755

## 2018-12-10 NOTE — Progress Notes (Signed)
Patient seen and examined with Dr. Harlow Asa. 60cc serous fluid aspirated from right axillary seroma. Dry dressing applied. Follow up in our office as scheduled next week, or she knows to call sooner with any concerns.  Wellington Hampshire, [facility] Surgery 12/10/2018, 3:28 PM Pager: [phone] Mon 7:00 am -11:30 AM Tues-Fri 7:00 am-4:30 pm Sat-Sun 7:00 am-11:30 am

## 2018-12-10 NOTE — Discharge Instructions (Addendum)
You have been diagnosed today with seroma of the right breast.  At this time there does not appear to be the presence of an emergent medical condition, however there is always the potential for conditions to change. Please read and follow the below instructions.  Please return to the Emergency Department immediately for any new or worsening symptoms. Please be sure to follow up with your Primary Care Provider within one week regarding your visit today; please call their office to schedule an appointment even if you are feeling better for a follow-up visit. Please call your surgeon's office today to schedule your follow-up appointment for next week for reevaluation.  Get Help right away if: You have a fever. You have redness or pain at the site of the seroma. You have fluid or pus coming from the seroma. Your seroma is more swollen or is getting bigger. Your seroma is warm to the touch. You have any new/concerning or worsening symptoms.  Please read the additional information packets attached to your discharge summary.  Do not take your medicine if  develop an itchy rash, swelling in your mouth or lips, or difficulty breathing; call 911 and seek immediate emergency medical attention if this occurs.

## 2018-12-10 NOTE — ED Triage Notes (Signed)
Pt presents from home via EMS with c/o lump under her right arm. Pt reports she started feeling pain in that area this morning. Pt is scheduled to start chemo tomorrow for breast cancer.

## 2018-12-11 ENCOUNTER — Telehealth: Payer: Self-pay | Admitting: *Deleted

## 2018-12-11 NOTE — Telephone Encounter (Signed)
FYI F/U call to Carol Crawford [phone]) after earlier call reporting "Area under right armpit drained in ED yesterday has returned today.  Not as large but able to fill palm of my hand.  No thermometer to check but do not feel feverish."    "I just left Dr. Ethlyn Gallery office.  Area under right armpit drained again today."  Advised to obtain thermometer, monitor area and call surgeon if any changes.   Currently no further needs or questions.

## 2018-12-13 ENCOUNTER — Other Ambulatory Visit: Payer: Self-pay

## 2018-12-13 ENCOUNTER — Emergency Department (HOSPITAL_COMMUNITY)
Admission: EM | Admit: 2018-12-13 | Discharge: 2018-12-13 | Disposition: A | Discharge status: Home / Self Care | Payer: Medicaid Other | Attending: Emergency Medicine | Admitting: Emergency Medicine

## 2018-12-13 ENCOUNTER — Encounter (HOSPITAL_COMMUNITY): Payer: Self-pay | Admitting: Emergency Medicine

## 2018-12-13 ENCOUNTER — Encounter: Payer: Self-pay | Admitting: Emergency Medicine

## 2018-12-13 ENCOUNTER — Emergency Department
Admission: EM | Admit: 2018-12-13 | Discharge: 2018-12-13 | Disposition: A | Discharge status: Home / Self Care | Payer: Medicaid Other | Attending: Emergency Medicine | Admitting: Emergency Medicine

## 2018-12-13 DIAGNOSIS — Z853 Personal history of malignant neoplasm of breast: Secondary | ICD-10-CM | POA: Insufficient documentation

## 2018-12-13 DIAGNOSIS — Z79899 Other long term (current) drug therapy: Secondary | ICD-10-CM | POA: Diagnosis not present

## 2018-12-13 DIAGNOSIS — Z7982 Long term (current) use of aspirin: Secondary | ICD-10-CM | POA: Insufficient documentation

## 2018-12-13 DIAGNOSIS — N644 Mastodynia: Secondary | ICD-10-CM | POA: Diagnosis not present

## 2018-12-13 DIAGNOSIS — R2231 Localized swelling, mass and lump, right upper limb: Secondary | ICD-10-CM | POA: Diagnosis present

## 2018-12-13 DIAGNOSIS — Z5321 Procedure and treatment not carried out due to patient leaving prior to being seen by health care provider: Secondary | ICD-10-CM | POA: Diagnosis not present

## 2018-12-13 DIAGNOSIS — N6489 Other specified disorders of breast: Secondary | ICD-10-CM | POA: Diagnosis not present

## 2018-12-13 DIAGNOSIS — F17210 Nicotine dependence, cigarettes, uncomplicated: Secondary | ICD-10-CM | POA: Insufficient documentation

## 2018-12-13 LAB — BASIC METABOLIC PANEL
Anion gap: 8 (ref 5–15)
BUN: 11 mg/dL (ref 6–20)
CO2: 25 mmol/L (ref 22–32)
Calcium: 9.2 mg/dL (ref 8.9–10.3)
Chloride: 104 mmol/L (ref 98–111)
Creatinine, Ser: 0.78 mg/dL (ref 0.44–1.00)
GFR calc Af Amer: >60 mL/min (ref >60)
GFR calc non Af Amer: >60 mL/min (ref >60)
Glucose, Bld: 96 mg/dL (ref 70–99)
Potassium: 3.5 mmol/L (ref 3.5–5.1)
Sodium: 137 mmol/L (ref 135–145)

## 2018-12-13 LAB — CBC WITH DIFFERENTIAL/PLATELET
Abs Immature Granulocytes: 0.01 10*3/uL (ref 0.00–0.07)
Basophils Absolute: 0 10*3/uL (ref 0.0–0.1)
Basophils Relative: 0 %
Eosinophils Absolute: 0.1 10*3/uL (ref 0.0–0.5)
Eosinophils Relative: 1 %
HCT: 40.9 % (ref 36.0–46.0)
Hemoglobin: 14.2 g/dL (ref 12.0–15.0)
Immature Granulocytes: 0 %
Lymphocytes Relative: 56 %
Lymphs Abs: 3.2 10*3/uL (ref 0.7–4.0)
MCH: 32.6 pg (ref 26.0–34.0)
MCHC: 34.7 g/dL (ref 30.0–36.0)
MCV: 94 fL (ref 80.0–100.0)
Monocytes Absolute: 0.3 10*3/uL (ref 0.1–1.0)
Monocytes Relative: 6 %
Neutro Abs: 2.1 10*3/uL (ref 1.7–7.7)
Neutrophils Relative %: 37 %
Platelets: 253 10*3/uL (ref 150–400)
RBC: 4.35 MIL/uL (ref 3.87–5.11)
RDW: 11.9 % (ref 11.5–15.5)
WBC: 5.7 10*3/uL (ref 4.0–10.5)
nRBC: 0 % (ref 0.0–0.2)

## 2018-12-13 LAB — TROPONIN I (HIGH SENSITIVITY): Troponin I (High Sensitivity): 3 ng/L (ref <18)

## 2018-12-13 MED ORDER — LIDOCAINE-EPINEPHRINE 2 %-1:100000 IJ SOLN
20.0000 mL | Freq: Once | INTRAMUSCULAR | Status: DC
  Filled 2018-12-13: qty 1

## 2018-12-13 NOTE — ED Triage Notes (Addendum)
Had left breast surgery last Wednesday, had lymph nodes removed.  Arrives today with complaints of continued severe pain to right axilla and breast.  Has been taking oxycodone 5 mg (last taken yesterday at 1730 -- now out of pills)m gabapentin 100 mg (last taken yesterday at 1730), and doxycycline 100 mg (last taken today at 1100).  Surgery done at [facility].  Since surgery, patient has had painful area drained twice, last on Friday by surgeon.

## 2018-12-13 NOTE — ED Triage Notes (Addendum)
Dx with breast cancer, had right breast surgery last Wednesday. Arrives with complaints of right breast pain and swelling, states has has had it drained two times this week already. Painful lump under right armpit, tender upon palpation.

## 2018-12-13 NOTE — Discharge Instructions (Addendum)
Contact a health care provider if: You have a fever. You have redness or pain at the site of the seroma. You have fluid or pus coming from the seroma. Your seroma is more swollen or is getting bigger. Your seroma is warm to the touch.

## 2018-12-13 NOTE — ED Provider Notes (Signed)
[name]   CSN: [id number] Arrival date & time: 12/13/18  1944     History   Chief Complaint Chief Complaint  Patient presents with  . lump in armpit    HPI Carol Crawford is a 43 y.o. female who presents emergency department chief complaint of swelling in her right arm pit.  Patient had a lumpectomy last Wednesday.  She has had 3 episodes of recurrent seroma accumulation in the right axilla with severe pain and paresthesia in the axillary/ radial nerve distribution running down the back of her tricep. She denies fever or chills. She last saw Dr. [name] 2 days ago and had >60 cc serous fluid removed.     HPI  Past Medical History:  Diagnosis Date  . ADHD   . Bipolar 1 disorder ([place])   . Depressed   . Drug overdose 2010  . Family history of breast cancer   . Family history of colon cancer   . Family history of prostate cancer   . GERD (gastroesophageal reflux disease)    OCC  . Headache    MIGRAINES  . History of MRSA infection    foot infection  . Migraine   . Schizophrenia ([place])   . Stroke [facility])    after foot surgery, left side was numb, fully recovered    Patient Active Problem List   Diagnosis Date Noted  . Genetic testing 11/26/2018  . Family history of breast cancer   . Family history of prostate cancer   . Family history of colon cancer   . Malignant neoplasm of upper-outer quadrant of right breast in female, estrogen receptor positive ([place]) 11/10/2018  . Acute ischemic stroke ([place])   . Left sided numbness 01/09/2018  . Depressed   . Bipolar 1 disorder [facility])     Past Surgical History:  Procedure Laterality Date  . BREAST BIOPSY Right 10/21/2018   Affirm Bx-"X" clip-path pending  . BREAST LUMPECTOMY WITH RADIOACTIVE SEED AND SENTINEL LYMPH NODE BIOPSY Right 12/02/2018   Procedure: RIGHT BREAST LUMPECTOMY WITH RADIOACTIVE SEED AND SENTINEL LYMPH NODE BIOPSY;  Surgeon: Carol Kussmaul, MD;  Location: [place];  Service: General;  Laterality: Right;  . INCISION AND DRAINAGE Left 01/09/2018   Procedure: INCISION AND DRAINAGE-below fascia foot; multiple;  Surgeon: Carol Crawford, DPM;  Location: [facility];  Service: Podiatry;  Laterality: Left;  . INCISION AND DRAINAGE Left 04/17/2018   Procedure: I&D; BELOW FASCIA FOOT; MULTIPLE;  Surgeon: Carol Crawford, DPM;  Location: [facility];  Service: Podiatry;  Laterality: Left;  . REPAIR EXTENSOR TENDON Left 09/05/2017   Procedure: REPAIR EXTENSOR TENDON;  Surgeon: Carol Crawford, DPM;  Location: [facility];  Service: Podiatry;  Laterality: Left;     OB History   No obstetric history on file.      Home Medications    Prior to Admission medications   Medication Sig Start Date End Date Taking? Authorizing Provider  aspirin 81 MG chewable tablet Chew 1 tablet (81 mg total) by mouth daily. Patient taking differently: Chew 81 mg by mouth at bedtime as needed for moderate pain.  01/11/18  Yes Carol Flock, MD  chlorhexidine (PERIDEX) 0.12 % solution 15 mLs by Mouth Rinse route 2 (two) times daily. 10/06/18  Yes [provider]  doxycycline (VIBRA-TABS) 100 MG tablet Take 100 mg by mouth 2 (two) times daily. 12/07/18  Yes [provider]  gabapentin (NEURONTIN) 100 MG capsule Take 100 mg by mouth  3 (three) times daily. 12/11/18  Yes [provider]  ibuprofen (ADVIL,MOTRIN) 600 MG tablet Take 600 mg by mouth every 6 (six) hours as needed for headache or moderate pain.    Yes [provider]  mupirocin ointment (BACTROBAN) 2 % Place 1 application into the nose 2 (two) times daily. 10/06/18  Yes [provider]  vitamin E 100 UNIT capsule Take by mouth daily.   Yes [provider]  HYDROcodone-acetaminophen (NORCO/VICODIN) 5-325 MG tablet Take 1-2 tablets by mouth every 6 (six) hours as needed for moderate pain or severe pain. Patient not taking: Reported on 12/13/2018 12/02/18   Carol Messing III, MD   medroxyPROGESTERone (DEPO-PROVERA) 150 MG/ML injection Inject 150 mg into the muscle every 3 (three) months.    [provider]  oxyCODONE (ROXICODONE) 5 MG immediate release tablet Take 1 tablet (5 mg total) by mouth every 8 (eight) hours as needed. Patient not taking: Reported on 12/13/2018 11/25/18 11/25/19  Carol Dike, FNP    Family History Family History  Problem Relation Age of Onset  . Arthritis Mother   . Hypertension Mother   . Cervical cancer Mother   . Prostate cancer Father   . Lung cancer Father   . Diabetes Paternal Aunt   . Breast cancer Paternal Aunt   . ADD / ADHD Daughter   . Bipolar disorder Daughter   . Hypertension Maternal Grandmother   . Stroke Maternal Grandmother   . Prostate cancer Maternal Grandfather   . Hypertension Maternal Grandfather   . Colon cancer Maternal Grandfather   . Hypertension Paternal Grandfather   . Stroke Paternal Grandfather   . Breast cancer Cousin     Social History Social History   Tobacco Use  . Smoking status: Current Every Day Smoker    Packs/day: 0.50    Years: 23.00    Pack years: 11.50    Types: Cigarettes    Last attempt to quit: 08/22/2017    Years since quitting: 1.3  . Smokeless tobacco: Never Used  Substance Use Topics  . Alcohol use: Yes    Comment: occasionally  . Drug use: Yes    Types: Marijuana    Comment: 1-2x week     Allergies   Shrimp [shellfish allergy], Penicillins, and Tomato   Review of Systems Review of Systems Ten systems reviewed and are negative for acute change, except as noted in the HPI.   Physical Exam Updated Vital Signs BP 114/71   Pulse 70   Temp 98.2 F (36.8 C) (Oral)   Resp 15   Ht 5\' 7"  (1.702 m)   Wt 75.8 kg   SpO2 100%   BMI 26.16 kg/m   Physical Exam Vitals signs and nursing note reviewed.  Constitutional:      General: She is not in acute distress.    Appearance: She is well-developed. She is not diaphoretic.  HENT:     Head: Normocephalic  and atraumatic.  Eyes:     General: No scleral icterus.    Conjunctiva/sclera: Conjunctivae normal.  Neck:     Musculoskeletal: Normal range of motion.  Cardiovascular:     Rate and Rhythm: Normal rate and regular rhythm.     Heart sounds: Normal heart sounds. No murmur. No friction rub. No gallop.   Pulmonary:     Effort: Pulmonary effort is normal. No respiratory distress.     Breath sounds: Normal breath sounds.  Abdominal:     General: Bowel sounds are normal. There  is no distension.     Palpations: Abdomen is soft. There is no mass.     Tenderness: There is no abdominal tenderness. There is no guarding.  Skin:    General: Skin is warm and dry.  Neurological:     Mental Status: She is alert and oriented to person, place, and time.  Psychiatric:        Behavior: Behavior normal.      ED Treatments / Results  Labs (all labs ordered are listed, but only abnormal results are displayed) Labs Reviewed - No data to display  EKG None  Radiology No results found.  Procedures .Marland KitchenIncision and Drainage  Date/Time: 12/13/2018 10:58 PM Performed by: Margarita Mail, PA-C Authorized by: Margarita Mail, PA-C   Consent:    Consent obtained:  Verbal   Consent given by:  Patient   Risks discussed:  Incomplete drainage, bleeding and infection   Alternatives discussed:  Delayed treatment Location:    Type:  Seroma   Location:  Trunk   Trunk location:  R breast Pre-procedure details:    Skin preparation:  Betadine Anesthesia (see MAR for exact dosages):    Anesthesia method:  Local infiltration   Local anesthetic:  Lidocaine 2% WITH epi Procedure type:    Complexity:  Simple Procedure details:    Needle aspiration: yes     Needle size:  18 G   Drainage:  Serous   Drainage amount:  Copious Post-procedure details:    Patient tolerance of procedure:  Tolerated well, no immediate complications Comments:     I was able to withdraw 97 cc of clear, yellow serous fluid from  the seroma.   (including critical care time)  Medications Ordered in ED Medications  lidocaine-EPINEPHrine (XYLOCAINE W/EPI) 2 %-1:100000 (with pres) injection 20 mL (has no administration in time range)     Initial Impression / Assessment and Plan / ED Course  I have reviewed the triage vital signs and the nursing notes.  Pertinent labs & imaging results that were available during my care of the patient were reviewed by me and considered in my medical decision making (see chart for details).      Spoke with Dr. Barry Dienes about the patient.  She does state that the patient very likely needs to have a seroma drain placed however they do not have any on the premises she would need to have this placed in the office.  She asked me to aspirate for fluid. Pain greatly improved after aspiration of seroma.  No evidence of infection.  Bandage applied.  Of asked her to call the office tomorrow morning to make sure she is getting follow-up for drain placement.  She appears appropriate for discharge at this time   Final Clinical Impressions(s) / ED Diagnoses   Final diagnoses:  Seroma of breast    ED Discharge Orders    None       Margarita Mail, PA-C 12/13/18 2300    Daleen Bo, MD 12/13/18 [phone]

## 2018-12-13 NOTE — ED Notes (Signed)
Pt now c/o chest pain. Pt taken to the back for EKG and blood work.

## 2018-12-13 NOTE — ED Notes (Signed)
Pt observed walking out of [facility] parking lot towards [address].

## 2018-12-13 NOTE — ED Notes (Signed)
Patient attempted to call daughters to pick her up, one does not have headlights on her car and the other is already asleep, patient will attempt to contact her "baby daddy" to give her a ride.

## 2018-12-13 NOTE — ED Notes (Signed)
Pt observed walking out of ED, in NAD

## 2018-12-15 ENCOUNTER — Other Ambulatory Visit (HOSPITAL_COMMUNITY)
Admission: RE | Admit: 2018-12-15 | Discharge: 2018-12-15 | Disposition: A | Discharge status: Home / Self Care | Payer: Medicaid Other | Source: Ambulatory Visit | Attending: General Surgery | Admitting: General Surgery

## 2018-12-15 DIAGNOSIS — Z20828 Contact with and (suspected) exposure to other viral communicable diseases: Secondary | ICD-10-CM | POA: Diagnosis not present

## 2018-12-15 DIAGNOSIS — Z01812 Encounter for preprocedural laboratory examination: Secondary | ICD-10-CM | POA: Diagnosis present

## 2018-12-16 ENCOUNTER — Telehealth: Payer: Self-pay | Admitting: *Deleted

## 2018-12-16 LAB — NOVEL CORONAVIRUS, NAA (HOSP ORDER, SEND-OUT TO REF LAB; TAT 18-24 HRS): SARS-CoV-2, NAA: NOT DETECTED

## 2018-12-16 NOTE — Telephone Encounter (Signed)
Received oncotype results of 20/6%.  Patient is aware.  Referral placed for Dr. Lisbeth Renshaw.

## 2018-12-17 ENCOUNTER — Ambulatory Visit
Admission: RE | Admit: 2018-12-17 | Discharge: 2018-12-17 | Disposition: A | Discharge status: Home / Self Care | Payer: Medicaid Other | Source: Ambulatory Visit | Attending: Radiation Oncology | Admitting: Radiation Oncology

## 2018-12-17 ENCOUNTER — Other Ambulatory Visit: Payer: Self-pay

## 2018-12-17 ENCOUNTER — Encounter (HOSPITAL_COMMUNITY): Payer: Self-pay | Admitting: *Deleted

## 2018-12-17 ENCOUNTER — Encounter: Payer: Self-pay | Admitting: Radiation Oncology

## 2018-12-17 ENCOUNTER — Inpatient Hospital Stay
Admission: RE | Admit: 2018-12-17 | Discharge: 2018-12-17 | Disposition: A | Discharge status: Home / Self Care | Payer: Medicaid Other | Source: Ambulatory Visit | Attending: Radiation Oncology | Admitting: Radiation Oncology

## 2018-12-17 VITALS — Ht 67.0 in | Wt 167.0 lb

## 2018-12-17 DIAGNOSIS — C50411 Malignant neoplasm of upper-outer quadrant of right female breast: Secondary | ICD-10-CM

## 2018-12-17 DIAGNOSIS — Z170 Estrogen receptor positive status [ER+]: Secondary | ICD-10-CM

## 2018-12-17 NOTE — Progress Notes (Signed)
Spoke with pt for pre-op call. Pt denies cardiac hx, HTN or diabetes. Pt had her Covid test done on 12/15/18 and it is negative. She states she has been in quarantine since the test was done. She voiced understanding of the visitation policy.

## 2018-12-17 NOTE — Progress Notes (Signed)
Location of Breast Cancer: Malignant neoplasm of upper outer quadrant of right breast, + + -   Staging form: Breast, AJCC 8th Edition - Clinical stage from 11/10/2018: Stage IA (cT1c, cN0, cM0, G1, ER+, PR+, HER2-) - Signed by Nicholas Lose, MD on 11/10/2018  24-monthfollow-up of breast calcifications showed calcifications increased in size, spanning 1.5cm, with no discrete mass. Biopsy on 10/21/18 showed invasive mammary carcinoma, grade 1, HER-2 negative (0), ER+ 51-90%, PR+ >90% ([facility]  Did patient present with symptoms (if so, please note symptoms) or was this found on screening mammography?:   Histology per Pathology Report: Right Lumpectomy 12/02/2018   Receptor Status: ER(+), PR (+), Her2-neu (-), Ki-()    Past/Anticipated interventions by surgeon, if any: Dr. TMarlou Starks9/11/2018  -The patient appears to have a small stage I grade 1 cancer of the upper outer right breast. -I have talked to her in detail about the different options for treatment and at this point she seems to favor breast conservation. I think this is a very reasonable way of treating her cancer. -Given her age she may also need genetic testing. I will go ahead and refer her to medical and radiation oncology to talk about adjuvant therapy. -Right Breast lumpectomy with radioactive seed and sentinel lymph node biopsy 12/02/2018- 1.4 cm, 4 axillary lymph nodes negative -Placement of drain right axilla 12/18/2018  Past/Anticipated interventions by medical oncology, if any: Chemotherapy  Dr. GLindi Adie9/16/2020 -   Lymphedema issues, if any: Right armpit  Pain issues, if any:    SAFETY ISSUES:  Prior radiation?   Pacemaker/ICD?   Possible current pregnancy?  Is the patient on methotrexate?   Current Complaints / other details:   -Negative genetic testing    SCori Razor RN 12/17/2018,8:00 AM

## 2018-12-17 NOTE — Progress Notes (Signed)
Location of Breast Cancer: Malignant neoplasm of upper outer quadrant of right breast, + + -   Staging form: Breast, AJCC 8th Edition - Clinical stage from 11/10/2018: Stage IA (cT1c, cN0, cM0, G1, ER+, PR+, HER2-) - Signed by Nicholas Lose, MD on 11/10/2018  14-monthfollow-up of breast calcifications showed calcifications increased in size, spanning 1.5cm, with no discrete mass. Biopsy on 10/21/18 showed invasive mammary carcinoma, grade 1, HER-2 negative (0), ER+ 51-90%, PR+ >90% ([facility]  Did patient present with symptoms (if so, please note symptoms) or was this found on screening mammography?:   Histology per Pathology Report: Right Lumpectomy 12/02/2018   Receptor Status: ER(+), PR (+), Her2-neu (-), Ki-()    Past/Anticipated interventions by surgeon, if any: Dr. TMarlou Starks9/11/2018  -The patient appears to have a small stage I grade 1 cancer of the upper outer right breast. -I have talked to her in detail about the different options for treatment and at this point she seems to favor breast conservation. I think this is a very reasonable way of treating her cancer. -Given her age she may also need genetic testing. I will go ahead and refer her to medical and radiation oncology to talk about adjuvant therapy. -Right Breast lumpectomy with radioactive seed and sentinel lymph node biopsy 12/02/2018- 1.4 cm, 4 axillary lymph nodes negative -Placement of drain right axilla 12/18/2018 -Axillary drained x4 since surgery.  Past/Anticipated interventions by medical oncology, if any: Chemotherapy  Dr. GLindi Adie9/16/2020 -12/02/2018:Right lumpectomy ([name]: IDC with DCIS, grade 1, 1.4cm, 4 axillary lymph nodes negative HER-2 negative (0), ER+ 51-90%, PR+ >90% ([facility]  Pathology counseling: I discussed the final pathology report of the patient provided  a copy of this report. I discussed the margins as well as lymph node surgeries. We also discussed the final staging along with previously  performed ER/PR and HER-2/neu testing.  Treatment plan: 1.  Oncotype DX to determine if she would benefit from chemo. 2.  Adjuvant radiation therapy 3.  Followed by adjuvant antiestrogen therapy  Return to clinic based upon Oncotype DX test result  Lymphedema issues, if any: Right armpit  Pain issues, if any:  Under right arm and side of breast.  SAFETY ISSUES:  Prior radiation? No  Pacemaker/ICD? No  Possible current pregnancy? No  Is the patient on methotrexate? No  Current Complaints / other details:   -Negative genetic testing    SCori Razor RN 12/17/2018,12:41 PM

## 2018-12-17 NOTE — Progress Notes (Signed)
Radiation Oncology         [phone] ________________________________  Initial Outpatient Consultation - Conducted via telephone due to current COVID-19 concerns for limiting patient exposure  I spoke with the patient to conduct this consult visit via telephone to spare the patient unnecessary potential exposure in the healthcare setting during the current COVID-19 pandemic. The patient was notified in advance and was offered a [place] meeting to allow for face to face communication but unfortunately reported that they did not have the appropriate resources/technology to support such a visit and instead preferred to proceed with a telephone consult.  ________________________________  Name: Carol Crawford        MRN: [id number]  Date of Service: 12/17/2018 DOB: [birth year]  FI:EPPIRJ, Penni Homans, MD  Jovita Kussmaul, MD     REFERRING PHYSICIAN: [name], MD   DIAGNOSIS: The encounter diagnosis was Malignant neoplasm of upper-outer quadrant of right breast in female, estrogen receptor positive ([place]).   HISTORY OF PRESENT ILLNESS: Carol Crawford is a 43 y.o. female with a diagnosis of right breast cancer. The patient was noted to have calcifications in the right breast while being followed for calcifications at six-month intervals.  This revealed a grouping of 1.5 cm of calcifications without any discrete mass.  She underwent a biopsy the same day which revealed a grade 1 invasive mammary carcinoma that was ER positive, PR positive, HER-2 negative. On 12/02/2018 underwent right lumpectomy with sentinel lymph node biopsy with Dr. [name].She was counseled on the rationale to proceed with lumpectomy and on final pathology revealed a grade 1 invasive ductal carcinoma measuring 1.4 cm.  Her tumor was less than 1 mm from the medial and posterior margin.  There was associated DCIS, and 4 sampled lymph nodes were all clear of carcinoma.  She has had seromatous changes in the operative site requiring  aspiration on 4 or 5 occasions. She is planning to have a drain placed with Dr. [name] tomorrow. She is contacted today to discuss the rationale for adjuvant radiotherapy.   PREVIOUS RADIATION THERAPY: No   PAST MEDICAL HISTORY:  Past Medical History:  Diagnosis Date   ADHD    Bipolar 1 disorder ([place])    Depressed    Drug overdose 2010   Family history of breast cancer    Family history of colon cancer    Family history of prostate cancer    GERD (gastroesophageal reflux disease)    OCC   Headache    MIGRAINES   History of MRSA infection    foot infection   Migraine    Schizophrenia ([place])    Stroke ([place])    after foot surgery, left side was numb, fully recovered       PAST SURGICAL HISTORY: Past Surgical History:  Procedure Laterality Date   BREAST BIOPSY Right 10/21/2018   Affirm Bx-"X" clip-path pending   BREAST LUMPECTOMY WITH RADIOACTIVE SEED AND SENTINEL LYMPH NODE BIOPSY Right 12/02/2018   Procedure: RIGHT BREAST LUMPECTOMY WITH RADIOACTIVE SEED AND SENTINEL LYMPH NODE BIOPSY;  Surgeon: Jovita Kussmaul, MD;  Location: [facility];  Service: General;  Laterality: Right;   INCISION AND DRAINAGE Left 01/09/2018   Procedure: INCISION AND DRAINAGE-below fascia foot; multiple;  Surgeon: [name], DPM;  Location: [facility];  Service: Podiatry;  Laterality: Left;   INCISION AND DRAINAGE Left 04/17/2018   Procedure: I&D; BELOW FASCIA FOOT; MULTIPLE;  Surgeon: [name], DPM;  Location: [facility];  Service: Podiatry;  Left;  °• REPAIR EXTENSOR TENDON Left 09/05/2017  ° Procedure: REPAIR EXTENSOR TENDON;  Surgeon: Fowler, Justin, DPM;  Location: [facility];  Service: Podiatry;  Laterality: Left;  ° ° ° °FAMILY HISTORY:  °Family History  °Problem Relation Age of Onset  °• Arthritis Mother   °• Hypertension Mother   °• Cervical cancer Mother   °• Prostate cancer Father   °• Lung cancer Father   °• Diabetes Paternal Aunt   °• Breast cancer  Paternal Aunt   °• ADD / ADHD Daughter   °• Bipolar disorder Daughter   °• Hypertension Maternal Grandmother   °• Stroke Maternal Grandmother   °• Prostate cancer Maternal Grandfather   °• Hypertension Maternal Grandfather   °• Colon cancer Maternal Grandfather   °• Hypertension Paternal Grandfather   °• Stroke Paternal Grandfather   °• Breast cancer Cousin   ° ° ° °SOCIAL HISTORY:  reports that she has been smoking cigarettes. She has a 11.50 pack-year smoking history. She has never used smokeless tobacco. She reports current alcohol use. She reports current drug use. Drug: Marijuana.  °She is single and lives in [place].  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 ° °ALLERGIES: Shrimp [shellfish allergy], Penicillins, and Tomato ° ° °MEDICATIONS:  °Current Outpatient Medications  °Medication Sig Dispense Refill  °• aspirin 81 MG chewable tablet Chew 1 tablet (81 mg total) by mouth daily. (Patient taking differently: Chew 81 mg by mouth at bedtime as needed for moderate pain. )    °• chlorhexidine (PERIDEX) 0.12 % solution 15 mLs by Mouth Rinse route 2 (two) times daily.    °• doxycycline (VIBRA-TABS) 100 MG tablet Take 100 mg by mouth 2 (two) times daily.    °• gabapentin (NEURONTIN) 100 MG capsule Take 100 mg by mouth 3 (three) times daily.    °• HYDROcodone-acetaminophen (NORCO/VICODIN) 5-325 MG tablet Take 1-2 tablets by mouth every 6 (six) hours as needed for moderate pain or severe pain. (Patient not taking: Reported on 12/13/2018) 15 tablet 0  °• ibuprofen (ADVIL,MOTRIN) 600 MG tablet Take 600 mg by mouth every 6 (six) hours as needed for headache or moderate pain.     °• medroxyPROGESTERone (DEPO-PROVERA) 150 MG/ML injection Inject 150 mg into the muscle every 3 (three) months.    °• mupirocin ointment (BACTROBAN) 2 % Place 1 application into the nose 2 (two) times daily.    °• oxyCODONE (ROXICODONE) 5 MG immediate release tablet Take 1 tablet (5 mg total) by mouth every 8 (eight) hours as needed. (Patient not  taking: Reported on 12/13/2018) 12 tablet 0  °• vitamin E 100 UNIT capsule Take by mouth daily.    ° °No current facility-administered medications for this encounter.   ° ° ° °REVIEW OF SYSTEMS: On review of systems, the patient reports that she is doing well overall. She denies any chest pain, shortness of breath, cough, fevers, chills, night sweats, unintended weight changes. She denies any bowel or bladder disturbances, and denies abdominal pain, nausea or vomiting. She denies any new musculoskeletal or joint aches or pains. A complete review of systems is obtained and is otherwise negative. ° °  ° °PHYSICAL EXAM:  °Wt Readings from Last 3 Encounters:  °12/13/18 167 lb (75.8 kg)  °12/10/18 167 lb (75.8 kg)  °12/02/18 164 lb 10.9 oz (74.7 kg)  ° °Unable to assess due to encounter type ° ° °ECOG = 1 ° °0 - Asymptomatic (Fully active, able to carry on all predisease activities without restriction) ° °1 -   1 - Symptomatic but completely ambulatory (Restricted in physically strenuous activity but ambulatory and able to carry out work of a light or sedentary nature. For example, light housework, office work)  2 - Symptomatic, <50% in bed during the day (Ambulatory and capable of all self care but unable to carry out any work activities. Up and about more than 50% of waking hours)  3 - Symptomatic, >50% in bed, but not bedbound (Capable of only limited self-care, confined to bed or chair 50% or more of waking hours)  4 - Bedbound (Completely disabled. Cannot carry on any self-care. Totally confined to bed or chair)  5 - Death   Carol Crawford MM, Creech RH, Tormey DC, et al. [phone]). "Toxicity and response criteria of the [facility]". [place] Oncol. 5 (6): 649-55    LABORATORY DATA:  Lab Results  Component Value Date   WBC 5.7 12/13/2018   HGB 14.2 12/13/2018   HCT 40.9 12/13/2018   MCV 94.0 12/13/2018   PLT 253 12/13/2018   Lab Results  Component Value Date   NA 137 12/13/2018   K  3.5 12/13/2018   CL 104 12/13/2018   CO2 25 12/13/2018   Lab Results  Component Value Date   ALT 20 03/31/2018   AST 22 03/31/2018   ALKPHOS 78 03/31/2018   BILITOT 0.5 03/31/2018      RADIOGRAPHY: Nm Sentinel Node Inj-no Rpt (breast)  Result Date: 12/02/2018 Sulfur colloid was injected by the nuclear medicine technologist for melanoma sentinel node.   Mm Breast Surgical Specimen  Result Date: 12/02/2018 CLINICAL DATA:  Patient status post right breast lumpectomy. EXAM: SPECIMEN RADIOGRAPH OF THE RIGHT BREAST COMPARISON:  Previous exam(s). FINDINGS: Status post excision of the right breast. The radioactive seed and biopsy marker clip are present, completely intact, and were marked for pathology. IMPRESSION: Specimen radiograph of the right breast. Electronically Signed   By: Lovey Newcomer M.D.   On: 12/02/2018 12:09   Mm Rt Radioactive Seed Loc Mammo Guide  Result Date: 12/01/2018 CLINICAL DATA:  The patient presents for needle localization prior to lumpectomy for invasive mammary carcinoma in the RIGHT breast. EXAM: MAMMOGRAPHIC GUIDED RADIOACTIVE SEED LOCALIZATION OF THE RIGHT BREAST COMPARISON:  Previous exam(s). FINDINGS: As I spoke with the patient about the procedure, she raises the issue of mastectomy. I spoke with the patient at length regarding the planned lumpectomy procedure, which is scheduled for tomorrow. I asked her if she would like to postpone surgery so that she can meet with Dr. [name] to discuss these options again. However, she is clear that she does not want to postpone surgery. She wants to proceed with lumpectomy tomorrow, but she may opt for mastectomy later. Patient states she wants to proceed with localization today for the lumpectomy tomorrow. Patient presents for radioactive seed localization prior to lumpectomy. I met with the patient and we discussed the procedure of seed localization including benefits and alternatives. We discussed the high likelihood of a successful  procedure. We discussed the risks of the procedure including infection, bleeding, tissue injury and further surgery. We discussed the low dose of radioactivity involved in the procedure. Informed, written consent was given. The usual time-out protocol was performed immediately prior to the procedure. Using mammographic guidance, sterile technique, 1% lidocaine and an I-125 radioactive seed, the X shaped clip in the LATERAL portion of the RIGHT breast was localized using a LATERAL to MEDIAL approach. The follow-up mammogram images confirm the seed in the expected  were marked for Dr. Toth. Follow-up survey of the patient confirms presence of the radioactive seed. Order number of I-125 seed:  [id number]. Total activity:  0.245 millicuries reference Date: 11/23/2018 The patient tolerated the procedure well and was [facility]. She was given instructions regarding seed removal. IMPRESSION: Radioactive seed localization right breast. No apparent complications. Electronically Signed   By: Elizabeth  Brown M.D.   On: 12/01/2018 16:13  ° °   ° °IMPRESSION/PLAN: °1. Stage IA, pT1cN0M0 grade 1, ER/PR positive invasive ductal carcinoma of the right breast. Dr. Moody discusses the pathology findings and reviews the nature of right breast disease. She continues to heal, but will not need systemic therapy. We discussed the role of external radiotherapy to the breast followed by antiestrogen therapy to reduce the risks of recurrence. We discussed the risks, benefits, short, and long term effects of radiotherapy, and the patient is interested in proceeding. Dr. Moody discusses the delivery and logistics of radiotherapy and anticipates a course of 6 1/2 weeks of radiotherapy. We will be in contact with her to coordinate simulation in a few weeks. She is in agreement with this plan. °2. Postoperative seroma. The patient will proceed with drain placement tomorrow with Dr. Toth. We will simulate once the  drain has been removed and her seroma has resolved. °3. Contraceptive counseling.  The patient uses Depo-Provera injections for contraception.  She will continue to discuss contraception with Dr. Gudena in the long-term and will be using antiestrogen therapy to reduce the risk of recurrence.  It appears that her last depo injection was due last week but she has not had this done yet. We will proceed with  a urine pregnancy prior to simulation ° ° ° °Given current concerns for patient exposure during the COVID-19 pandemic, this encounter was conducted via telephone.  °The patient has given verbal consent for this type of encounter. °The time spent during this encounter was 30 minutes and 50% of that time was spent in the coordination of her care. °The attendants for this meeting included Dr. Moody, LaToya Silva, RN,  , PAC and Huyen L Tolen . Her mother was able to join the call by cell phone also. During the encounter, Dr. Moody, and LaToya Silva, RN and   PAC were located at [facility] Radiation Oncology Department.  °Carol Crawford  was located at home. Her mother was located at her phone but was on the phone conference as well. ° °The above documentation reflects my direct findings during this shared patient visit. Please see the separate note by Dr. Moody on this date for the remainder of the patient's plan of care. ° ° ° ° C. , PAC ° °

## 2018-12-18 ENCOUNTER — Encounter (HOSPITAL_COMMUNITY): Payer: Self-pay

## 2018-12-18 ENCOUNTER — Ambulatory Visit (HOSPITAL_COMMUNITY): Payer: Medicaid Other | Admitting: Certified Registered Nurse Anesthetist

## 2018-12-18 ENCOUNTER — Ambulatory Visit (HOSPITAL_COMMUNITY)
Admission: RE | Admit: 2018-12-18 | Discharge: 2018-12-18 | Disposition: A | Discharge status: Home / Self Care | Payer: Medicaid Other | Attending: General Surgery | Admitting: General Surgery

## 2018-12-18 ENCOUNTER — Encounter (HOSPITAL_COMMUNITY)
Admission: RE | Disposition: A | Discharge status: Home / Self Care | Payer: Self-pay | Source: Home / Self Care | Attending: General Surgery

## 2018-12-18 ENCOUNTER — Other Ambulatory Visit: Payer: Self-pay

## 2018-12-18 DIAGNOSIS — Z811 Family history of alcohol abuse and dependence: Secondary | ICD-10-CM | POA: Insufficient documentation

## 2018-12-18 DIAGNOSIS — R51 Headache: Secondary | ICD-10-CM | POA: Insufficient documentation

## 2018-12-18 DIAGNOSIS — Z836 Family history of other diseases of the respiratory system: Secondary | ICD-10-CM | POA: Diagnosis not present

## 2018-12-18 DIAGNOSIS — Z170 Estrogen receptor positive status [ER+]: Secondary | ICD-10-CM | POA: Diagnosis not present

## 2018-12-18 DIAGNOSIS — Z91018 Allergy to other foods: Secondary | ICD-10-CM | POA: Diagnosis not present

## 2018-12-18 DIAGNOSIS — L7634 Postprocedural seroma of skin and subcutaneous tissue following other procedure: Secondary | ICD-10-CM | POA: Diagnosis present

## 2018-12-18 DIAGNOSIS — Z8673 Personal history of transient ischemic attack (TIA), and cerebral infarction without residual deficits: Secondary | ICD-10-CM | POA: Diagnosis not present

## 2018-12-18 DIAGNOSIS — F17200 Nicotine dependence, unspecified, uncomplicated: Secondary | ICD-10-CM | POA: Insufficient documentation

## 2018-12-18 DIAGNOSIS — Z818 Family history of other mental and behavioral disorders: Secondary | ICD-10-CM | POA: Insufficient documentation

## 2018-12-18 DIAGNOSIS — C50411 Malignant neoplasm of upper-outer quadrant of right female breast: Secondary | ICD-10-CM | POA: Insufficient documentation

## 2018-12-18 DIAGNOSIS — Z8249 Family history of ischemic heart disease and other diseases of the circulatory system: Secondary | ICD-10-CM | POA: Diagnosis not present

## 2018-12-18 DIAGNOSIS — F419 Anxiety disorder, unspecified: Secondary | ICD-10-CM | POA: Diagnosis not present

## 2018-12-18 DIAGNOSIS — Z880 Allergy status to penicillin: Secondary | ICD-10-CM | POA: Diagnosis not present

## 2018-12-18 DIAGNOSIS — Z79899 Other long term (current) drug therapy: Secondary | ICD-10-CM | POA: Diagnosis not present

## 2018-12-18 DIAGNOSIS — F209 Schizophrenia, unspecified: Secondary | ICD-10-CM | POA: Insufficient documentation

## 2018-12-18 DIAGNOSIS — K219 Gastro-esophageal reflux disease without esophagitis: Secondary | ICD-10-CM | POA: Insufficient documentation

## 2018-12-18 DIAGNOSIS — Z91013 Allergy to seafood: Secondary | ICD-10-CM | POA: Insufficient documentation

## 2018-12-18 DIAGNOSIS — F319 Bipolar disorder, unspecified: Secondary | ICD-10-CM | POA: Diagnosis not present

## 2018-12-18 DIAGNOSIS — Y838 Other surgical procedures as the cause of abnormal reaction of the patient, or of later complication, without mention of misadventure at the time of the procedure: Secondary | ICD-10-CM | POA: Insufficient documentation

## 2018-12-18 DIAGNOSIS — Z800 Family history of malignant neoplasm of digestive organs: Secondary | ICD-10-CM | POA: Insufficient documentation

## 2018-12-18 HISTORY — PX: INCISION AND DRAINAGE: SHX5863

## 2018-12-18 LAB — POCT PREGNANCY, URINE: Preg Test, Ur: NEGATIVE

## 2018-12-18 SURGERY — INCISION AND DRAINAGE
Anesthesia: General | Site: Breast | Laterality: Right

## 2018-12-18 MED ORDER — SCOPOLAMINE 1 MG/3DAYS TD PT72
1.0000 | MEDICATED_PATCH | Freq: Once | TRANSDERMAL | Status: DC
  Administered 2018-12-18: 11:00:00 1.5 mg via TRANSDERMAL

## 2018-12-18 MED ORDER — FENTANYL CITRATE (PF) 250 MCG/5ML IJ SOLN
INTRAMUSCULAR | Status: AC
  Filled 2018-12-18: qty 5

## 2018-12-18 MED ORDER — DEXAMETHASONE SODIUM PHOSPHATE 10 MG/ML IJ SOLN
INTRAMUSCULAR | Status: DC | PRN
  Administered 2018-12-18: 10 mg via INTRAVENOUS

## 2018-12-18 MED ORDER — HYDROCODONE-ACETAMINOPHEN 5-325 MG PO TABS: 1.0000 | ORAL_TABLET | Freq: Four times a day (QID) | ORAL | 0 refills | Status: DC | PRN

## 2018-12-18 MED ORDER — MIDAZOLAM HCL 2 MG/2ML IJ SOLN
INTRAMUSCULAR | Status: AC
  Filled 2018-12-18: qty 2

## 2018-12-18 MED ORDER — FENTANYL CITRATE (PF) 100 MCG/2ML IJ SOLN
25.0000 ug | INTRAMUSCULAR | Status: DC | PRN
  Administered 2018-12-18 (×2): 50 ug via INTRAVENOUS

## 2018-12-18 MED ORDER — BUPIVACAINE-EPINEPHRINE (PF) 0.25% -1:200000 IJ SOLN
INTRAMUSCULAR | Status: AC
  Filled 2018-12-18: qty 20

## 2018-12-18 MED ORDER — SCOPOLAMINE 1 MG/3DAYS TD PT72
MEDICATED_PATCH | TRANSDERMAL | Status: AC
  Administered 2018-12-18: 1.5 mg via TRANSDERMAL
  Filled 2018-12-18: qty 1

## 2018-12-18 MED ORDER — FENTANYL CITRATE (PF) 100 MCG/2ML IJ SOLN
INTRAMUSCULAR | Status: AC
  Filled 2018-12-18: qty 2

## 2018-12-18 MED ORDER — EPHEDRINE SULFATE-NACL 50-0.9 MG/10ML-% IV SOSY
PREFILLED_SYRINGE | INTRAVENOUS | Status: DC | PRN
  Administered 2018-12-18 (×4): 5 mg via INTRAVENOUS

## 2018-12-18 MED ORDER — LACTATED RINGERS IV SOLN
INTRAVENOUS | Status: DC
  Administered 2018-12-18: 11:00:00 via INTRAVENOUS

## 2018-12-18 MED ORDER — LIDOCAINE 2% (20 MG/ML) 5 ML SYRINGE
INTRAMUSCULAR | Status: DC | PRN
  Administered 2018-12-18: 40 mg via INTRAVENOUS

## 2018-12-18 MED ORDER — BUPIVACAINE HCL (PF) 0.25 % IJ SOLN
INTRAMUSCULAR | Status: DC | PRN
  Administered 2018-12-18: 10 mL

## 2018-12-18 MED ORDER — BUPIVACAINE HCL (PF) 0.25 % IJ SOLN
INTRAMUSCULAR | Status: AC
  Filled 2018-12-18: qty 30

## 2018-12-18 MED ORDER — VANCOMYCIN HCL IN DEXTROSE 1-5 GM/200ML-% IV SOLN
1000.0000 mg | Freq: Once | INTRAVENOUS | Status: AC
  Administered 2018-12-18: 12:00:00 1000 mg via INTRAVENOUS

## 2018-12-18 MED ORDER — ACETAMINOPHEN 500 MG PO TABS
1000.0000 mg | ORAL_TABLET | Freq: Once | ORAL | Status: AC
  Administered 2018-12-18: 1000 mg via ORAL
  Filled 2018-12-18: qty 2

## 2018-12-18 MED ORDER — MIDAZOLAM HCL 5 MG/5ML IJ SOLN
INTRAMUSCULAR | Status: DC | PRN
  Administered 2018-12-18 (×2): 1 mg via INTRAVENOUS

## 2018-12-18 MED ORDER — PROPOFOL 10 MG/ML IV BOLUS
INTRAVENOUS | Status: DC | PRN
  Administered 2018-12-18: 200 mg via INTRAVENOUS

## 2018-12-18 MED ORDER — VANCOMYCIN HCL IN DEXTROSE 1-5 GM/200ML-% IV SOLN
INTRAVENOUS | Status: AC
  Administered 2018-12-18: 1000 mg via INTRAVENOUS
  Filled 2018-12-18: qty 200

## 2018-12-18 MED ORDER — FENTANYL CITRATE (PF) 250 MCG/5ML IJ SOLN
INTRAMUSCULAR | Status: DC | PRN
  Administered 2018-12-18: 100 ug via INTRAVENOUS
  Administered 2018-12-18: 50 ug via INTRAVENOUS

## 2018-12-18 MED ORDER — ONDANSETRON HCL 4 MG/2ML IJ SOLN
INTRAMUSCULAR | Status: DC | PRN
  Administered 2018-12-18: 4 mg via INTRAVENOUS

## 2018-12-18 MED ORDER — 0.9 % SODIUM CHLORIDE (POUR BTL) OPTIME
TOPICAL | Status: DC | PRN
  Administered 2018-12-18: 1000 mL

## 2018-12-18 SURGICAL SUPPLY — 31 items
BAG DECANTER FOR FLEXI CONT (MISCELLANEOUS) ×1 IMPLANT
BIOPATCH RED 1 DISK 7.0 (GAUZE/BANDAGES/DRESSINGS) ×1 IMPLANT
BIOPATCH RED 1IN DISK 7.0MM (GAUZE/BANDAGES/DRESSINGS) ×1
CHLORAPREP W/TINT 10.5 ML (MISCELLANEOUS) ×3 IMPLANT
COVER SURGICAL LIGHT HANDLE (MISCELLANEOUS) ×3 IMPLANT
DECANTER SPIKE VIAL GLASS SM (MISCELLANEOUS) ×1 IMPLANT
DRAIN CHANNEL 15F RND FF W/TCR (WOUND CARE) ×2 IMPLANT
DRSG TEGADERM 4X4.75 (GAUZE/BANDAGES/DRESSINGS) ×2 IMPLANT
ELECT REM PT RETURN 9FT ADLT (ELECTROSURGICAL) ×3
ELECTRODE REM PT RTRN 9FT ADLT (ELECTROSURGICAL) ×1 IMPLANT
EVACUATOR SILICONE 100CC (DRAIN) ×2 IMPLANT
GLOVE BIO SURGEON STRL SZ7.5 (GLOVE) ×3 IMPLANT
GOWN STRL REUS W/ TWL LRG LVL3 (GOWN DISPOSABLE) ×2 IMPLANT
GOWN STRL REUS W/TWL LRG LVL3 (GOWN DISPOSABLE) ×4
KIT BASIN OR (CUSTOM PROCEDURE TRAY) ×3 IMPLANT
KIT TURNOVER KIT B (KITS) ×3 IMPLANT
NDL HYPO 25GX1X1/2 BEV (NEEDLE) IMPLANT
NEEDLE 22X1 1/2 (OR ONLY) (NEEDLE) IMPLANT
NEEDLE HYPO 25GX1X1/2 BEV (NEEDLE) ×3 IMPLANT
NS IRRIG 1000ML POUR BTL (IV SOLUTION) ×3 IMPLANT
PAD ARMBOARD 7.5X6 YLW CONV (MISCELLANEOUS) ×1 IMPLANT
PENCIL BUTTON HOLSTER BLD 10FT (ELECTRODE) ×3 IMPLANT
POSITIONER HEAD DONUT 9IN (MISCELLANEOUS) ×3 IMPLANT
SHEATH COOK PEEL AWAY SET 9F (SHEATH) IMPLANT
SUT ETHILON 2 0 FS 18 (SUTURE) ×2 IMPLANT
SUT PROLENE 2 0 SH 30 (SUTURE) ×1 IMPLANT
SYR 10ML LL (SYRINGE) IMPLANT
SYR 5ML LUER SLIP (SYRINGE) ×1 IMPLANT
SYR CONTROL 10ML LL (SYRINGE) ×2 IMPLANT
TOWEL GREEN STERILE (TOWEL DISPOSABLE) ×3 IMPLANT
TOWEL GREEN STERILE FF (TOWEL DISPOSABLE) ×3 IMPLANT

## 2018-12-18 NOTE — Transfer of Care (Signed)
Immediate Anesthesia Transfer of Care Note  Patient: Carol Crawford  Procedure(s) Performed: PLACEMENT OF DRAIN IN RIGHT AXILLA (Right Breast)  Patient Location: PACU  Anesthesia Type:General  Level of Consciousness: awake  Airway & Oxygen Therapy: Patient Spontanous Breathing and Patient connected to face mask oxygen  Post-op Assessment: Report given to RN and Post -op Vital signs reviewed and stable  Post vital signs: Reviewed  Last Vitals:  Vitals Value Taken Time  BP 114/63 12/18/18 1230  Temp    Pulse 63 12/18/18 1231  Resp 16 12/18/18 1231  SpO2 100 % 12/18/18 1231  Vitals shown include unvalidated device data.  Last Pain:  Vitals:   12/18/18 0954  TempSrc: Oral         Complications: No apparent anesthesia complications

## 2018-12-18 NOTE — Op Note (Signed)
12/18/2018  12:17 PM  PATIENT:  Carol Crawford  43 y.o. female  PRE-OPERATIVE DIAGNOSIS:  Right Breast Cancer with axillary seroma  POST-OPERATIVE DIAGNOSIS:  Right Breast Cancer with axillary seroma  PROCEDURE:  Procedure(s): PLACEMENT OF DRAIN RIGHT AXILLA (Right)  SURGEON:  Surgeon(s) and Role:    * Jovita Kussmaul, MD - Primary  PHYSICIAN ASSISTANT:   ASSISTANTS: none   ANESTHESIA:   local and general  EBL:  10 mL   BLOOD ADMINISTERED:none  DRAINS: none   LOCAL MEDICATIONS USED:  MARCAINE     SPECIMEN:  No Specimen  DISPOSITION OF SPECIMEN:  N/A  COUNTS:  YES  TOURNIQUET:  * No tourniquets in log *  DICTATION: .Dragon Dictation   After informed consent was obtained the patient was brought to the operating room and placed in the supine position on the operating table.  After adequate induction of general anesthesia the patient's right chest, breast, and axillary area were prepped with ChloraPrep, allowed to dry, and draped in usual sterile manner.  An appropriate timeout was performed.  An area for an incision was chosen in the right posterior inferior axilla.  This area was infiltrated with quarter percent Marcaine.  A small stab incision was made with a 15 blade knife.  A tonsil clamp was placed through this opening and used to dissected bluntly through the subcutaneous tissue until the seroma cavity was entered.  A 19 French round Blake drain was then placed along this tract and into the seroma cavity.  The drain was anchored to the skin with a 2-0 nylon stitch.  The drain was placed to bulb suction and a moderate amount of clear yellow fluid was able to be evacuated.  Sterile dressings were applied.  The patient tolerated the procedure well.  At the end of the case all needle sponge and instrument counts were correct.  The patient was then awakened and taken to recovery in stable condition.  PLAN OF CARE: Discharge to home after PACU  PATIENT DISPOSITION:  PACU -  hemodynamically stable.   Delay start of Pharmacological VTE agent (>24hrs) due to surgical blood loss or risk of bleeding: not applicable

## 2018-12-18 NOTE — Anesthesia Postprocedure Evaluation (Signed)
Anesthesia Post Note  Patient: Carol Crawford  Procedure(s) Performed: PLACEMENT OF DRAIN IN RIGHT AXILLA (Right Breast)     Patient location during evaluation: PACU Anesthesia Type: General Level of consciousness: awake and alert Pain management: pain level controlled Vital Signs Assessment: post-procedure vital signs reviewed and stable Respiratory status: spontaneous breathing, nonlabored ventilation, respiratory function stable and patient connected to nasal cannula oxygen Cardiovascular status: blood pressure returned to baseline and stable Postop Assessment: no apparent nausea or vomiting Anesthetic complications: no    Last Vitals:  Vitals:   12/18/18 1400 12/18/18 1401  BP:  125/77  Pulse: (!) 50 (!) 51  Resp: 12 16  Temp: (!) 36.1 C   SpO2: 100% 100%    Last Pain:  Vitals:   12/18/18 1300  TempSrc:   PainSc: 9                  Ahnika Hannibal L Dannon Perlow

## 2018-12-18 NOTE — Interval H&P Note (Signed)
History and Physical Interval Note:  12/18/2018 11:31 AM  Carol Crawford  has presented today for surgery, with the diagnosis of BREAST CANCER.  The various methods of treatment have been discussed with the patient and family. After consideration of risks, benefits and other options for treatment, the patient has consented to  Procedure(s): PLACEMENT OF DRAIN RIGHT AXILLA (Right) as a surgical intervention.  The patient's history has been reviewed, patient examined, no change in status, stable for surgery.  I have reviewed the patient's chart and labs.  Questions were answered to the patient's satisfaction.     [name]

## 2018-12-18 NOTE — Anesthesia Procedure Notes (Signed)
Procedure Name: LMA Insertion Date/Time: 12/18/2018 11:55 AM Performed by: Janene Harvey, CRNA Pre-anesthesia Checklist: Patient identified, Emergency Drugs available, Suction available and Patient being monitored Patient Re-evaluated:Patient Re-evaluated prior to induction Oxygen Delivery Method: Circle system utilized Preoxygenation: Pre-oxygenation with 100% oxygen Induction Type: IV induction LMA: LMA inserted LMA Size: 3.0 Dental Injury: Teeth and Oropharynx as per pre-operative assessment

## 2018-12-18 NOTE — H&P (Signed)
Carol Crawford  Location: [facility] Patient #: [id number] DOB: [birth year] Single / Language: Cleophus Molt / Race: Black or African American Female   History of Present Illness  The patient is a 43 year old female who presents for a follow-up for Breast cancer. The patient is a 43 year old white female who is about 2 weeks status post right breast lumpectomy and sentinel node biopsy for a T1 cN0 right breast cancer that was ER and PR positive and HER-2 negative. She tolerated the surgery well. Her postoperative course has been complicated by a seroma in the right axilla. She feels as though the fluid has built up again since yesterday. She is scheduled for drain placement on Friday  Past Surgical History ([place], [place]; 11/04/2018 3:55 PM) Breast Biopsy  Right.  Diagnostic Studies History Nance Pew, [place]; 11/04/2018 3:55 PM) Mammogram  within last year Pap Smear  1-5 years ago  Allergies Nance Pew, CMA; 11/04/2018 3:55 PM) Penicillins  Allergies Reconciled   Medication History Nance Pew, CMA; 11/04/2018 3:56 PM) Tylenol (325MG Tablet, Oral) Active. Medications Reconciled  Social History Nance Pew, CMA; 11/04/2018 3:55 PM) Alcohol use  Occasional alcohol use. Caffeine use  Coffee, Tea. Illicit drug use  Prefer to discuss with provider. Tobacco use  Current every day smoker.  Family History Nance Pew, [place]; 11/04/2018 3:55 PM) Alcohol Abuse  Brother, Father. Colon Cancer  Father. Depression  Mother, Sister. Heart disease in female family member before age 50  Hypertension  Mother. Respiratory Condition  Father.  Pregnancy / Birth History Nance Pew, [place]; 11/04/2018 3:55 PM) Age at menarche  77 years. Gravida  2 Length (months) of breastfeeding  3-6 Maternal age  64-20 Para  2  Other Problems Nance Pew, CMA; 11/04/2018 3:55 PM) Anxiety Disorder  Back Pain  Breast Cancer  Depression     Review of  Systems (Sabrina Canty CMA; 11/04/2018 3:55 PM) General Present- Appetite Loss and Night Sweats. Not Present- Chills, Fatigue, Fever, Weight Gain and Weight Loss. HEENT Present- Ringing in the Ears and Wears glasses/contact lenses. Not Present- Earache, Hearing Loss, Hoarseness, Nose Bleed, Oral Ulcers, Seasonal Allergies, Sinus Pain, Sore Throat, Visual Disturbances and Yellow Eyes. Cardiovascular Present- Swelling of Extremities. Not Present- Chest Pain, Difficulty Breathing Lying Down, Leg Cramps, Palpitations, Rapid Heart Rate and Shortness of Breath. Gastrointestinal Present- Hemorrhoids. Not Present- Abdominal Pain, Bloating, Bloody Stool, Change in Bowel Habits, Chronic diarrhea, Constipation, Difficulty Swallowing, Excessive gas, Gets full quickly at meals, Indigestion, Nausea, Rectal Pain and Vomiting. Female Genitourinary Present- Frequency. Not Present- Nocturia, Painful Urination, Pelvic Pain and Urgency. Musculoskeletal Present- Back Pain, Joint Pain, Joint Stiffness, Muscle Pain, Muscle Weakness and Swelling of Extremities. Neurological Present- Decreased Memory and Weakness. Not Present- Fainting, Headaches, Numbness, Seizures, Tingling, Tremor and Trouble walking. Psychiatric Present- Anxiety and Frequent crying. Not Present- Bipolar, Change in Sleep Pattern, Depression and Fearful. Endocrine Present- Hot flashes. Not Present- Cold Intolerance, Excessive Hunger, Hair Changes, Heat Intolerance and New Diabetes. Hematology Present- Easy Bruising. Not Present- Blood Thinners, Excessive bleeding, Gland problems, HIV and Persistent Infections.  Allergies  Penicillins  Tomato Flavor *PHARMACEUTICAL ADJUVANTS*  Shellfish  Allergies Reconciled   Medication History  Tylenol (325MG Tablet, Oral) Active. Neurontin (100MG Capsule, 1 (one) Oral three times daily, Taken starting 12/11/2018) Active. Medications Reconciled  Vitals  Weight: 162.5 lb Height: 67in Body Surface Area:  1.85 m Body Mass Index: 25.45 kg/m  Temp.: 97.27F  Pulse: 97 (Regular)  BP: 114/82(Sitting, Left Arm, Standard)  Physical Exam Breast Note: The right breast upper outer quadrant incision is healing nicely with no sign of infection. There is a moderate sized seroma in the right axilla. The skin was prepped with ChloraPrep and infiltrated with 1% lidocaine. I was able to aspirate 60 cc of clear yellow fluid and she tolerated this well.  General Mental Status-Alert. General Appearance-Consistent with stated age. Hydration-Well hydrated. Voice-Normal.  Head and Neck Head-normocephalic, atraumatic with no lesions or palpable masses. Trachea-midline. Thyroid Gland Characteristics - normal size and consistency.  Eye Eyeball - Bilateral-Extraocular movements intact. Sclera/Conjunctiva - Bilateral-No scleral icterus.  Chest and Lung Exam Chest and lung exam reveals -quiet, even and easy respiratory effort with no use of accessory muscles and on auscultation, normal breath sounds, no adventitious sounds and normal vocal resonance. Inspection Chest Wall - Normal. Back - normal.   Cardiovascular Cardiovascular examination reveals -normal heart sounds, regular rate and rhythm with no murmurs and normal pedal pulses bilaterally.  Abdomen Inspection Inspection of the abdomen reveals - No Hernias. Skin - Scar - no surgical scars. Palpation/Percussion Palpation and Percussion of the abdomen reveal - Soft, Non Tender, No Rebound tenderness, No Rigidity (guarding) and No hepatosplenomegaly. Auscultation Auscultation of the abdomen reveals - Bowel sounds normal.  Neurologic Neurologic evaluation reveals -alert and oriented x 3 with no impairment of recent or remote memory. Mental Status-Normal.  Musculoskeletal Normal Exam - Left-Upper Extremity Strength Normal and Lower Extremity Strength Normal. Normal Exam - Right-Upper Extremity Strength  Normal and Lower Extremity Strength Normal.  Lymphatic Head & Neck  General Head & Neck Lymphatics: Bilateral - Description - Normal. Axillary  General Axillary Region: Bilateral - Description - Normal. Tenderness - Non Tender. Femoral & Inguinal  Generalized Femoral & Inguinal Lymphatics: Bilateral - Description - Normal. Tenderness - Non Tender.   Assessment & Plan  MALIGNANT NEOPLASM OF UPPER-OUTER QUADRANT OF RIGHT BREAST IN FEMALE, ESTROGEN RECEPTOR POSITIVE (C50.411) Impression: The patient is about 2 weeks status post right breast lumpectomy for breast cancer. Her postoperative course has been complicated by a seroma in the right axilla. I was able to aspirate 60 cc today and she tolerated this well. She is scheduled for placement of a drain on Friday. I will plan to see her back next week for a recheck  Current Plans Patient was instructed to follow-up in one week. Started Norco 5-325 MG Oral Tablet, 1 (one) Tablet every six hours, as needed, #20, 12/16/2018, No Refill.

## 2018-12-18 NOTE — Anesthesia Preprocedure Evaluation (Addendum)
Anesthesia Evaluation  Patient identified by MRN, date of birth, ID band Patient awake    Reviewed: Allergy & Precautions, NPO status , Patient's Chart, lab work & pertinent test results  Airway Mallampati: I  TM Distance: >3 FB Neck ROM: Full    Dental no notable dental hx. (+) Teeth Intact, Dental Advisory Given   Pulmonary Current Smoker and Patient abstained from smoking., former smoker,    Pulmonary exam normal breath sounds clear to auscultation       Cardiovascular negative cardio ROS Normal cardiovascular exam Rhythm:Regular Rate:Normal  '18 ECHO: normal LVF, normal valves   Neuro/Psych  Headaches, PSYCHIATRIC DISORDERS Depression Bipolar Disorder Schizophrenia CVA, No Residual Symptoms    GI/Hepatic Neg liver ROS, GERD  ,  Endo/Other  negative endocrine ROS  Renal/GU negative Renal ROS  negative genitourinary   Musculoskeletal negative musculoskeletal ROS (+)   Abdominal   Peds  Hematology negative hematology ROS (+)   Anesthesia Other Findings   Reproductive/Obstetrics                            Anesthesia Physical Anesthesia Plan  ASA: III  Anesthesia Plan: General   Post-op Pain Management:    Induction: Intravenous  PONV Risk Score and Plan: 3 and Ondansetron, Dexamethasone and Midazolam  Airway Management Planned: LMA  Additional Equipment:   Intra-op Plan:   Post-operative Plan: Extubation in OR  Informed Consent: I have reviewed the patients History and Physical, chart, labs and discussed the procedure including the risks, benefits and alternatives for the proposed anesthesia with the patient or authorized representative who has indicated his/her understanding and acceptance.     Dental advisory given  Plan Discussed with: CRNA  Anesthesia Plan Comments:         Anesthesia Quick Evaluation

## 2018-12-19 ENCOUNTER — Encounter (HOSPITAL_COMMUNITY): Payer: Self-pay | Admitting: General Surgery

## 2018-12-22 ENCOUNTER — Encounter: Payer: Self-pay | Admitting: Radiation Oncology

## 2018-12-22 NOTE — Progress Notes (Signed)
Patient left voicemail for me to return her call.  Called patient and left voicemail with my return contact information.

## 2018-12-24 ENCOUNTER — Encounter: Payer: Self-pay | Admitting: *Deleted

## 2018-12-28 ENCOUNTER — Encounter: Payer: Self-pay | Admitting: Hematology and Oncology

## 2019-01-01 ENCOUNTER — Telehealth: Payer: Self-pay | Admitting: Radiation Oncology

## 2019-01-01 NOTE — Telephone Encounter (Signed)
I called the patient to see if her drain is out. She reports she still has the drain and it is still putting out 50-75 cc/day. She sees Dr. [name] again on 10/23. I let her know that our staff would reach out about having her come in the following week for simulation. She is in agreement. Scheduling is notified.

## 2019-01-20 ENCOUNTER — Ambulatory Visit
Admission: RE | Admit: 2019-01-20 | Discharge: 2019-01-20 | Disposition: A | Discharge status: Home / Self Care | Payer: Medicaid Other | Source: Ambulatory Visit | Attending: Radiation Oncology | Admitting: Radiation Oncology

## 2019-01-20 ENCOUNTER — Other Ambulatory Visit: Payer: Self-pay

## 2019-01-20 DIAGNOSIS — C50411 Malignant neoplasm of upper-outer quadrant of right female breast: Secondary | ICD-10-CM | POA: Insufficient documentation

## 2019-01-20 DIAGNOSIS — Z170 Estrogen receptor positive status [ER+]: Secondary | ICD-10-CM | POA: Insufficient documentation

## 2019-01-21 NOTE — Progress Notes (Signed)
  Radiation Oncology         [phone] ________________________________  Name: Carol Crawford MRN: [id number]  Date: 01/20/2019  DOB: Jun 03, 1975  Optical Surface Tracking Plan:  Since intensity modulated radiotherapy (IMRT) and 3D conformal radiation treatment methods are predicated on accurate and precise positioning for treatment, intrafraction motion monitoring is medically necessary to ensure accurate and safe treatment delivery.  The ability to quantify intrafraction motion without excessive ionizing radiation dose can only be performed with optical surface tracking. Accordingly, surface imaging offers the opportunity to obtain 3D measurements of patient position throughout IMRT and 3D treatments without excessive radiation exposure.  I am ordering optical surface tracking for this patient's upcoming course of radiotherapy. ________________________________  Kyung Rudd, MD 01/21/2019 11:15 AM    Reference:   Ursula Alert, J, et al. Surface imaging-based analysis of intrafraction motion for breast radiotherapy patients.Journal of [place], n. 6, nov. 2014. ISSN [id number].   Available at: <http://www.jacmp.org/index.php/jacmp/article/view/4957>.

## 2019-01-21 NOTE — Progress Notes (Signed)
  Radiation Oncology         [phone] ________________________________  Name: Carol Crawford MRN: [id number]  Date: 01/20/2019  DOB: [birth year]  DIAGNOSIS:     ICD-10-CM   1. Malignant neoplasm of upper-outer quadrant of right breast in female, estrogen receptor positive ([place])  C50.411    Z17.0      SIMULATION AND TREATMENT PLANNING NOTE  The patient presented for simulation prior to beginning her course of radiation treatment for her diagnosis of right-sided breast cancer. The patient was placed in a supine position on a breast board. A customized vac-lock bag was constructed and this complex treatment device will be used on a daily basis during her treatment. In this fashion, a CT scan was obtained through the chest area and an isocenter was placed near the chest wall within the breast.  The patient will be planned to receive a course of radiation initially to a dose of 50.4 Gy. This will consist of a whole breast radiotherapy technique. To accomplish this, 2 customized blocks have been designed which will correspond to medial and lateral whole breast tangent fields. This treatment will be accomplished at 1.8 Gy per fraction. A forward planning technique will also be evaluated to determine if this approach improves the plan. It is anticipated that the patient will then receive a 10 Gy boost to the seroma cavity which has been contoured. This will be accomplished at 2 Gy per fraction.   This initial treatment will consist of a 3-D conformal technique. The seroma has been contoured as the primary target structure. Additionally, dose volume histograms of both this target as well as the lungs and heart will also be evaluated. Such an approach is necessary to ensure that the target area is adequately covered while the nearby critical  normal structures are adequately spared.  Plan:  The final anticipated total dose therefore will correspond to 60.4 Gy.    _______________________________    Jodelle Gross, MD, PhD

## 2019-01-26 DIAGNOSIS — C50411 Malignant neoplasm of upper-outer quadrant of right female breast: Secondary | ICD-10-CM | POA: Insufficient documentation

## 2019-01-26 DIAGNOSIS — Z170 Estrogen receptor positive status [ER+]: Secondary | ICD-10-CM | POA: Diagnosis present

## 2019-01-27 ENCOUNTER — Telehealth: Payer: Self-pay | Admitting: Hematology and Oncology

## 2019-01-27 NOTE — Telephone Encounter (Signed)
Scheduled ed remappt per 11/3 sch message - mailed reminder letter with appt date and time

## 2019-01-28 ENCOUNTER — Ambulatory Visit
Admission: RE | Admit: 2019-01-28 | Discharge: 2019-01-28 | Disposition: A | Discharge status: Home / Self Care | Payer: Medicaid Other | Source: Ambulatory Visit | Attending: Radiation Oncology | Admitting: Radiation Oncology

## 2019-01-28 ENCOUNTER — Other Ambulatory Visit: Payer: Self-pay

## 2019-01-28 DIAGNOSIS — C50411 Malignant neoplasm of upper-outer quadrant of right female breast: Secondary | ICD-10-CM | POA: Diagnosis not present

## 2019-01-28 DIAGNOSIS — Z923 Personal history of irradiation: Secondary | ICD-10-CM

## 2019-01-28 HISTORY — DX: Personal history of irradiation: Z92.3

## 2019-01-29 ENCOUNTER — Other Ambulatory Visit: Payer: Self-pay

## 2019-01-29 ENCOUNTER — Ambulatory Visit
Admission: RE | Admit: 2019-01-29 | Discharge: 2019-01-29 | Disposition: A | Discharge status: Home / Self Care | Payer: Medicaid Other | Source: Ambulatory Visit | Attending: Radiation Oncology | Admitting: Radiation Oncology

## 2019-01-29 DIAGNOSIS — C50411 Malignant neoplasm of upper-outer quadrant of right female breast: Secondary | ICD-10-CM | POA: Diagnosis not present

## 2019-02-01 ENCOUNTER — Other Ambulatory Visit: Payer: Self-pay

## 2019-02-01 ENCOUNTER — Ambulatory Visit
Admission: RE | Admit: 2019-02-01 | Discharge: 2019-02-01 | Disposition: A | Discharge status: Home / Self Care | Payer: Medicaid Other | Source: Ambulatory Visit | Attending: Radiation Oncology | Admitting: Radiation Oncology

## 2019-02-01 DIAGNOSIS — C50411 Malignant neoplasm of upper-outer quadrant of right female breast: Secondary | ICD-10-CM | POA: Diagnosis not present

## 2019-02-02 ENCOUNTER — Ambulatory Visit
Admission: RE | Admit: 2019-02-02 | Discharge: 2019-02-02 | Disposition: A | Discharge status: Home / Self Care | Payer: Medicaid Other | Source: Ambulatory Visit | Attending: Radiation Oncology | Admitting: Radiation Oncology

## 2019-02-02 ENCOUNTER — Other Ambulatory Visit: Payer: Self-pay

## 2019-02-02 DIAGNOSIS — C50411 Malignant neoplasm of upper-outer quadrant of right female breast: Secondary | ICD-10-CM | POA: Diagnosis not present

## 2019-02-03 ENCOUNTER — Other Ambulatory Visit: Payer: Self-pay

## 2019-02-03 ENCOUNTER — Ambulatory Visit
Admission: RE | Admit: 2019-02-03 | Discharge: 2019-02-03 | Disposition: A | Discharge status: Home / Self Care | Payer: Medicaid Other | Source: Ambulatory Visit | Attending: Radiation Oncology | Admitting: Radiation Oncology

## 2019-02-03 DIAGNOSIS — C50411 Malignant neoplasm of upper-outer quadrant of right female breast: Secondary | ICD-10-CM | POA: Diagnosis not present

## 2019-02-04 ENCOUNTER — Ambulatory Visit
Admission: RE | Admit: 2019-02-04 | Discharge: 2019-02-04 | Disposition: A | Discharge status: Home / Self Care | Payer: Medicaid Other | Source: Ambulatory Visit | Attending: Radiation Oncology | Admitting: Radiation Oncology

## 2019-02-04 ENCOUNTER — Other Ambulatory Visit: Payer: Self-pay

## 2019-02-04 DIAGNOSIS — C50411 Malignant neoplasm of upper-outer quadrant of right female breast: Secondary | ICD-10-CM | POA: Diagnosis not present

## 2019-02-05 ENCOUNTER — Other Ambulatory Visit: Payer: Self-pay

## 2019-02-05 ENCOUNTER — Ambulatory Visit
Admission: RE | Admit: 2019-02-05 | Discharge: 2019-02-05 | Disposition: A | Discharge status: Home / Self Care | Payer: Medicaid Other | Source: Ambulatory Visit | Attending: Radiation Oncology | Admitting: Radiation Oncology

## 2019-02-05 DIAGNOSIS — C50411 Malignant neoplasm of upper-outer quadrant of right female breast: Secondary | ICD-10-CM | POA: Diagnosis not present

## 2019-02-08 ENCOUNTER — Ambulatory Visit: Payer: Medicaid Other

## 2019-02-08 ENCOUNTER — Ambulatory Visit
Admission: RE | Admit: 2019-02-08 | Discharge: 2019-02-08 | Disposition: A | Discharge status: Home / Self Care | Payer: Medicaid Other | Source: Ambulatory Visit | Attending: Radiation Oncology | Admitting: Radiation Oncology

## 2019-02-08 DIAGNOSIS — C50411 Malignant neoplasm of upper-outer quadrant of right female breast: Secondary | ICD-10-CM | POA: Diagnosis not present

## 2019-02-09 ENCOUNTER — Ambulatory Visit
Admission: RE | Admit: 2019-02-09 | Discharge: 2019-02-09 | Disposition: A | Discharge status: Home / Self Care | Payer: Medicaid Other | Source: Ambulatory Visit | Attending: Radiation Oncology | Admitting: Radiation Oncology

## 2019-02-09 ENCOUNTER — Other Ambulatory Visit: Payer: Self-pay

## 2019-02-09 DIAGNOSIS — C50411 Malignant neoplasm of upper-outer quadrant of right female breast: Secondary | ICD-10-CM | POA: Diagnosis not present

## 2019-02-10 ENCOUNTER — Ambulatory Visit
Admission: RE | Admit: 2019-02-10 | Discharge: 2019-02-10 | Disposition: A | Discharge status: Home / Self Care | Payer: Medicaid Other | Source: Ambulatory Visit | Attending: Radiation Oncology | Admitting: Radiation Oncology

## 2019-02-10 ENCOUNTER — Other Ambulatory Visit: Payer: Self-pay

## 2019-02-10 DIAGNOSIS — C50411 Malignant neoplasm of upper-outer quadrant of right female breast: Secondary | ICD-10-CM | POA: Diagnosis not present

## 2019-02-11 ENCOUNTER — Ambulatory Visit
Admission: RE | Admit: 2019-02-11 | Discharge: 2019-02-11 | Disposition: A | Discharge status: Home / Self Care | Payer: Medicaid Other | Source: Ambulatory Visit | Attending: Radiation Oncology | Admitting: Radiation Oncology

## 2019-02-11 ENCOUNTER — Other Ambulatory Visit: Payer: Self-pay

## 2019-02-11 DIAGNOSIS — C50411 Malignant neoplasm of upper-outer quadrant of right female breast: Secondary | ICD-10-CM | POA: Diagnosis not present

## 2019-02-12 ENCOUNTER — Ambulatory Visit
Admission: RE | Admit: 2019-02-12 | Discharge: 2019-02-12 | Disposition: A | Discharge status: Home / Self Care | Payer: Medicaid Other | Source: Ambulatory Visit | Attending: Radiation Oncology | Admitting: Radiation Oncology

## 2019-02-12 ENCOUNTER — Other Ambulatory Visit: Payer: Self-pay

## 2019-02-12 DIAGNOSIS — C50411 Malignant neoplasm of upper-outer quadrant of right female breast: Secondary | ICD-10-CM | POA: Diagnosis not present

## 2019-02-14 ENCOUNTER — Other Ambulatory Visit: Payer: Self-pay

## 2019-02-14 ENCOUNTER — Ambulatory Visit
Admission: RE | Admit: 2019-02-14 | Discharge: 2019-02-14 | Disposition: A | Discharge status: Home / Self Care | Payer: Medicaid Other | Source: Ambulatory Visit | Attending: Radiation Oncology | Admitting: Radiation Oncology

## 2019-02-14 DIAGNOSIS — C50411 Malignant neoplasm of upper-outer quadrant of right female breast: Secondary | ICD-10-CM | POA: Diagnosis not present

## 2019-02-15 ENCOUNTER — Other Ambulatory Visit: Payer: Self-pay

## 2019-02-15 ENCOUNTER — Ambulatory Visit
Admission: RE | Admit: 2019-02-15 | Discharge: 2019-02-15 | Disposition: A | Discharge status: Home / Self Care | Payer: Medicaid Other | Source: Ambulatory Visit | Attending: Radiation Oncology | Admitting: Radiation Oncology

## 2019-02-15 DIAGNOSIS — Z170 Estrogen receptor positive status [ER+]: Secondary | ICD-10-CM

## 2019-02-15 DIAGNOSIS — C50411 Malignant neoplasm of upper-outer quadrant of right female breast: Secondary | ICD-10-CM | POA: Diagnosis not present

## 2019-02-15 MED ORDER — SONAFINE EX EMUL
1.0000 "application " | Freq: Two times a day (BID) | CUTANEOUS | Status: DC
  Administered 2019-02-15: 1 via TOPICAL

## 2019-02-16 ENCOUNTER — Other Ambulatory Visit: Payer: Self-pay

## 2019-02-16 ENCOUNTER — Ambulatory Visit
Admission: RE | Admit: 2019-02-16 | Discharge: 2019-02-16 | Disposition: A | Discharge status: Home / Self Care | Payer: Medicaid Other | Source: Ambulatory Visit | Attending: Radiation Oncology | Admitting: Radiation Oncology

## 2019-02-16 DIAGNOSIS — C50411 Malignant neoplasm of upper-outer quadrant of right female breast: Secondary | ICD-10-CM | POA: Diagnosis not present

## 2019-02-17 ENCOUNTER — Other Ambulatory Visit: Payer: Self-pay

## 2019-02-17 ENCOUNTER — Ambulatory Visit
Admission: RE | Admit: 2019-02-17 | Discharge: 2019-02-17 | Disposition: A | Discharge status: Home / Self Care | Payer: Medicaid Other | Source: Ambulatory Visit | Attending: Radiation Oncology | Admitting: Radiation Oncology

## 2019-02-17 DIAGNOSIS — C50411 Malignant neoplasm of upper-outer quadrant of right female breast: Secondary | ICD-10-CM | POA: Diagnosis not present

## 2019-02-22 ENCOUNTER — Ambulatory Visit: Payer: Medicaid Other | Attending: General Surgery

## 2019-02-22 ENCOUNTER — Ambulatory Visit: Payer: Medicaid Other

## 2019-02-23 ENCOUNTER — Ambulatory Visit
Admission: RE | Admit: 2019-02-23 | Discharge: 2019-02-23 | Disposition: A | Discharge status: Home / Self Care | Payer: Medicaid Other | Source: Ambulatory Visit | Attending: Radiation Oncology | Admitting: Radiation Oncology

## 2019-02-23 ENCOUNTER — Other Ambulatory Visit: Payer: Self-pay

## 2019-02-23 DIAGNOSIS — Z170 Estrogen receptor positive status [ER+]: Secondary | ICD-10-CM | POA: Diagnosis present

## 2019-02-23 DIAGNOSIS — C50411 Malignant neoplasm of upper-outer quadrant of right female breast: Secondary | ICD-10-CM | POA: Diagnosis not present

## 2019-02-24 ENCOUNTER — Other Ambulatory Visit: Payer: Self-pay

## 2019-02-24 ENCOUNTER — Ambulatory Visit
Admission: RE | Admit: 2019-02-24 | Discharge: 2019-02-24 | Disposition: A | Discharge status: Home / Self Care | Payer: Medicaid Other | Source: Ambulatory Visit | Attending: Radiation Oncology | Admitting: Radiation Oncology

## 2019-02-24 DIAGNOSIS — C50411 Malignant neoplasm of upper-outer quadrant of right female breast: Secondary | ICD-10-CM | POA: Diagnosis not present

## 2019-02-25 ENCOUNTER — Other Ambulatory Visit: Payer: Self-pay

## 2019-02-25 ENCOUNTER — Ambulatory Visit
Admission: RE | Admit: 2019-02-25 | Discharge: 2019-02-25 | Disposition: A | Discharge status: Home / Self Care | Payer: Medicaid Other | Source: Ambulatory Visit | Attending: Radiation Oncology | Admitting: Radiation Oncology

## 2019-02-25 DIAGNOSIS — C50411 Malignant neoplasm of upper-outer quadrant of right female breast: Secondary | ICD-10-CM | POA: Diagnosis not present

## 2019-02-26 ENCOUNTER — Ambulatory Visit
Admission: RE | Admit: 2019-02-26 | Discharge: 2019-02-26 | Disposition: A | Discharge status: Home / Self Care | Payer: Medicaid Other | Source: Ambulatory Visit | Attending: Radiation Oncology | Admitting: Radiation Oncology

## 2019-02-26 ENCOUNTER — Other Ambulatory Visit: Payer: Self-pay

## 2019-02-26 DIAGNOSIS — C50411 Malignant neoplasm of upper-outer quadrant of right female breast: Secondary | ICD-10-CM | POA: Diagnosis not present

## 2019-02-26 DIAGNOSIS — Z170 Estrogen receptor positive status [ER+]: Secondary | ICD-10-CM

## 2019-02-26 MED ORDER — SONAFINE EX EMUL
1.0000 "application " | Freq: Two times a day (BID) | CUTANEOUS | Status: DC
  Administered 2019-02-26: 1 via TOPICAL

## 2019-02-26 NOTE — Progress Notes (Signed)
Dr. Lisbeth Renshaw notified of cream allergy for Sonfine.

## 2019-03-01 ENCOUNTER — Ambulatory Visit
Admission: RE | Admit: 2019-03-01 | Discharge: 2019-03-01 | Disposition: A | Discharge status: Home / Self Care | Payer: Medicaid Other | Source: Ambulatory Visit | Attending: Radiation Oncology | Admitting: Radiation Oncology

## 2019-03-01 ENCOUNTER — Other Ambulatory Visit: Payer: Self-pay

## 2019-03-01 DIAGNOSIS — C50411 Malignant neoplasm of upper-outer quadrant of right female breast: Secondary | ICD-10-CM | POA: Diagnosis not present

## 2019-03-02 ENCOUNTER — Ambulatory Visit
Admission: RE | Admit: 2019-03-02 | Discharge: 2019-03-02 | Disposition: A | Discharge status: Home / Self Care | Payer: Medicaid Other | Source: Ambulatory Visit | Attending: Radiation Oncology | Admitting: Radiation Oncology

## 2019-03-02 ENCOUNTER — Other Ambulatory Visit: Payer: Self-pay

## 2019-03-02 DIAGNOSIS — C50411 Malignant neoplasm of upper-outer quadrant of right female breast: Secondary | ICD-10-CM | POA: Diagnosis not present

## 2019-03-03 ENCOUNTER — Other Ambulatory Visit: Payer: Self-pay

## 2019-03-03 ENCOUNTER — Ambulatory Visit
Admission: RE | Admit: 2019-03-03 | Discharge: 2019-03-03 | Disposition: A | Discharge status: Home / Self Care | Payer: Medicaid Other | Source: Ambulatory Visit | Attending: Radiation Oncology | Admitting: Radiation Oncology

## 2019-03-03 DIAGNOSIS — C50411 Malignant neoplasm of upper-outer quadrant of right female breast: Secondary | ICD-10-CM | POA: Diagnosis not present

## 2019-03-04 ENCOUNTER — Ambulatory Visit: Payer: Medicaid Other

## 2019-03-04 ENCOUNTER — Telehealth: Payer: Self-pay | Admitting: Radiation Oncology

## 2019-03-04 ENCOUNTER — Other Ambulatory Visit: Payer: Self-pay

## 2019-03-04 DIAGNOSIS — Z20822 Contact with and (suspected) exposure to covid-19: Secondary | ICD-10-CM

## 2019-03-04 DIAGNOSIS — Z170 Estrogen receptor positive status [ER+]: Secondary | ICD-10-CM

## 2019-03-04 DIAGNOSIS — Z20828 Contact with and (suspected) exposure to other viral communicable diseases: Secondary | ICD-10-CM

## 2019-03-04 NOTE — Telephone Encounter (Signed)
I called and spoke with the patient. She came today and was stopped at the registration desk after telling them she was in contact with a covid positive person. Ms. Carol Crawford was treated yesterday for #23/33 treatments for her right breast cancer. 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 Ms. Carol Crawford friend is apparently asymptomatic but tested positive for covid Monday or Tuesday of this week (12/7-12/10/2018).   Ms. Carol Crawford had not seen this friend this week until yesterday evening. I let Ms. Carol Crawford know she needs to quarantine for 14 days or until she has a negative covid test. She should go for testing 5-7 days following her exposure, which is early next week. I've placed orders for the patient to get tested and given her the information on how to contact the testing line to schedule her appointment which she can do at [facility] also talked to the treatment staff to let them know she hadn't been exposed to this person while taking treatment this week and that we would postpone her treatment until she has a test result and she will will go into quarantine.

## 2019-03-05 ENCOUNTER — Ambulatory Visit: Payer: Medicaid Other | Admitting: Radiation Oncology

## 2019-03-05 ENCOUNTER — Other Ambulatory Visit: Payer: Self-pay

## 2019-03-05 ENCOUNTER — Ambulatory Visit: Payer: Medicaid Other

## 2019-03-05 DIAGNOSIS — Z20822 Contact with and (suspected) exposure to covid-19: Secondary | ICD-10-CM

## 2019-03-06 LAB — NOVEL CORONAVIRUS, NAA: SARS-CoV-2, NAA: NOT DETECTED

## 2019-03-08 ENCOUNTER — Ambulatory Visit: Payer: Medicaid Other

## 2019-03-08 ENCOUNTER — Telehealth: Payer: Self-pay | Admitting: Radiation Oncology

## 2019-03-08 ENCOUNTER — Telehealth: Payer: Self-pay | Admitting: Emergency Medicine

## 2019-03-08 ENCOUNTER — Other Ambulatory Visit: Payer: Self-pay | Admitting: Radiation Oncology

## 2019-03-08 DIAGNOSIS — C50411 Malignant neoplasm of upper-outer quadrant of right female breast: Secondary | ICD-10-CM

## 2019-03-08 DIAGNOSIS — Z20822 Contact with and (suspected) exposure to covid-19: Secondary | ICD-10-CM

## 2019-03-08 DIAGNOSIS — Z20828 Contact with and (suspected) exposure to other viral communicable diseases: Secondary | ICD-10-CM

## 2019-03-08 DIAGNOSIS — Z170 Estrogen receptor positive status [ER+]: Secondary | ICD-10-CM

## 2019-03-08 NOTE — Telephone Encounter (Signed)
I called the patient today to discuss the results of her negative Covid testing.  Unfortunately she went too soon to have a reliable test.  She told our nursing staff when they called her this morning that she was not willing to have another test, I spoke with Dr. Lisbeth Renshaw about the situation.  In summary again the patient was exposed ( we think it was a low risk exposure ) last Wednesday evening. She went for testing on 03/05/2019 despite being counseled ongoing today or tomorrow.  She states she does not understand why she would have a negative Covid test if she still possibly has it and I explained to her about the timing of viral shedding to be able to have a reliable test and that per CDC guidelines patients should be tested no earlier than 5 days from the time of exposure.  The options that she has are to retest her and to come out of quarantine 7 days after the exposure if negative versus continuing quarantine for 14 days from the date of exposure without testing which would be next Wednesday evening that she could come out of quarantine. That would mean that she could resume radiotherapy on March 18, 2019 if she does not opt to have retesting.  I gave her a choice between these options and she stated that she was not sure what she wanted to do.  She will let us know by the afternoon.

## 2019-03-09 ENCOUNTER — Ambulatory Visit: Payer: Medicaid Other

## 2019-03-09 ENCOUNTER — Telehealth: Payer: Self-pay

## 2019-03-09 ENCOUNTER — Telehealth: Payer: Self-pay | Admitting: Radiation Oncology

## 2019-03-09 DIAGNOSIS — C50411 Malignant neoplasm of upper-outer quadrant of right female breast: Secondary | ICD-10-CM | POA: Diagnosis not present

## 2019-03-09 NOTE — Telephone Encounter (Signed)
Patient left VM stating that she was having difficulty with website to schedule COVID-19 testing. Went to website with patient still on the phone and tried to select an appointment for her for Wed 12/16, but was not able to submit request. Asked patient if she had tried calling the [phone] number, she stated she had but wasn't able to speak with anyone. Informed patient I would give them a call for her and call her back after I talked with them. Patient agreeable to plan.  Called [phone] and while waiting to speak with someone, attempted again to virtually schedule patient for an appointment on Thurs 12/17 and was successful. Hung up phone and called patient back with update. She stated she received a notification on her phone about the appointment and was agreeable to date/time. No other issues or concerns identified at this time, but patient knows to call clinic should something arise in the meantime.   Alean Rinne and L3 updated on plan. MyChart message sent to let patient know to not to come to [place] until Monday pending negative test results.

## 2019-03-09 NOTE — Telephone Encounter (Signed)
I called the patient back today to see if she had decided what she'd like to do. She's decided she will go for repeat covid test and is going to schedule to have it done today. I let her know we'd follow up with the results prior to resuming treatment hopefully this Thursday.

## 2019-03-10 ENCOUNTER — Ambulatory Visit: Payer: Medicaid Other

## 2019-03-10 ENCOUNTER — Ambulatory Visit: Payer: Medicaid Other | Admitting: Radiation Oncology

## 2019-03-11 ENCOUNTER — Ambulatory Visit: Payer: Medicaid Other | Admitting: Radiation Oncology

## 2019-03-11 ENCOUNTER — Other Ambulatory Visit: Payer: Self-pay

## 2019-03-11 ENCOUNTER — Ambulatory Visit: Payer: Medicaid Other | Attending: Internal Medicine

## 2019-03-11 DIAGNOSIS — Z20822 Contact with and (suspected) exposure to covid-19: Secondary | ICD-10-CM

## 2019-03-12 ENCOUNTER — Ambulatory Visit: Payer: Medicaid Other

## 2019-03-12 ENCOUNTER — Ambulatory Visit: Payer: Medicaid Other | Admitting: Radiation Oncology

## 2019-03-12 LAB — NOVEL CORONAVIRUS, NAA: SARS-CoV-2, NAA: NOT DETECTED

## 2019-03-15 ENCOUNTER — Ambulatory Visit: Payer: Medicaid Other

## 2019-03-16 ENCOUNTER — Ambulatory Visit: Payer: Medicaid Other | Admitting: Radiation Oncology

## 2019-03-16 ENCOUNTER — Ambulatory Visit
Admission: RE | Admit: 2019-03-16 | Discharge: 2019-03-16 | Disposition: A | Discharge status: Home / Self Care | Payer: Medicaid Other | Source: Ambulatory Visit | Attending: Radiation Oncology | Admitting: Radiation Oncology

## 2019-03-16 ENCOUNTER — Ambulatory Visit: Payer: Medicaid Other | Admitting: Hematology and Oncology

## 2019-03-16 ENCOUNTER — Ambulatory Visit: Payer: Medicaid Other

## 2019-03-16 ENCOUNTER — Other Ambulatory Visit: Payer: Self-pay

## 2019-03-16 DIAGNOSIS — C50411 Malignant neoplasm of upper-outer quadrant of right female breast: Secondary | ICD-10-CM | POA: Diagnosis not present

## 2019-03-16 NOTE — Assessment & Plan Note (Deleted)
12/02/2018:Right lumpectomy [name]): IDC with DCIS, grade 1, 1.4cm, 4 axillary lymph nodes negative HER-2 negative (0), ER+ 51-90%, PR+ >90% (done at [facility]) Oncotype DX recurrence score 20: Distant recurrence at 9 years: 6% Based on tailor Rx data chemotherapy was not given. Adjuvant radiation therapy: 01/29/2019-03/16/2019  Treatment plan: Adjuvant antiestrogen therapy with tamoxifen 20 mg daily x10 years   Tamoxifen counseling:We discussed the risks and benefits of tamoxifen. These include but not limited to insomnia, hot flashes, mood changes, vaginal dryness, and weight gain. Although rare, serious side effects including endometrial cancer, risk of blood clots were also discussed. We strongly believe that the benefits far outweigh the risks. Patient understands these risks and consented to starting treatment. Planned treatment duration is 10 years.  Return to back in 3 months for survivorship care plan visit

## 2019-03-17 ENCOUNTER — Ambulatory Visit
Admission: RE | Admit: 2019-03-17 | Discharge: 2019-03-17 | Disposition: A | Discharge status: Home / Self Care | Payer: Medicaid Other | Source: Ambulatory Visit | Attending: Radiation Oncology | Admitting: Radiation Oncology

## 2019-03-17 ENCOUNTER — Ambulatory Visit: Payer: Medicaid Other

## 2019-03-17 ENCOUNTER — Other Ambulatory Visit: Payer: Self-pay | Admitting: Radiation Oncology

## 2019-03-17 ENCOUNTER — Other Ambulatory Visit: Payer: Self-pay

## 2019-03-17 DIAGNOSIS — C50411 Malignant neoplasm of upper-outer quadrant of right female breast: Secondary | ICD-10-CM | POA: Diagnosis not present

## 2019-03-17 MED ORDER — OXYCODONE-ACETAMINOPHEN 5-325 MG PO TABS: 1.0000 | ORAL_TABLET | Freq: Four times a day (QID) | ORAL | 0 refills | Status: DC | PRN

## 2019-03-18 ENCOUNTER — Other Ambulatory Visit: Payer: Self-pay

## 2019-03-18 ENCOUNTER — Encounter: Payer: Self-pay | Admitting: General Practice

## 2019-03-18 ENCOUNTER — Ambulatory Visit
Admission: RE | Admit: 2019-03-18 | Discharge: 2019-03-18 | Disposition: A | Discharge status: Home / Self Care | Payer: Medicaid Other | Source: Ambulatory Visit | Attending: Radiation Oncology | Admitting: Radiation Oncology

## 2019-03-18 ENCOUNTER — Inpatient Hospital Stay: Payer: Medicaid Other | Attending: Hematology and Oncology | Admitting: General Practice

## 2019-03-18 DIAGNOSIS — C50411 Malignant neoplasm of upper-outer quadrant of right female breast: Secondary | ICD-10-CM | POA: Diagnosis not present

## 2019-03-18 NOTE — Progress Notes (Signed)
[place] CSW Progress Notes  Met w patient in person to address her concerns re her living situation.  She wants help with environmental issues at her apartment.  States taht she has received $196 water bill (she lives alone), city has determined there is a leak in her apartment after investigation.  She has tried to get her landlord to fix this, to no avail.  Displays pictures of water damage in basement of apartment building.  Is also concerned about noisy neighbors overhead and possible illegal activity in the parking lot.  Feels all of the above disturbs her ability to recover from her radiation.  Is also in process of applying for disability as result of foot injury that occurred when glass fell on her foot.  Has third hearing shortly.  Is out of work since beginning of treatment for breast cancer.  Has gotten financial assistance from local agencies in [place] including [employer], [employer] and one other agency that she cannot remember name of.  Has also received full amount of [employer].  Reports she gets approx $260 in [place], does not drive so no transportation expenses.  She is not behind in her rent at this time.  Gave her information on [place] which provides housing mediation and eviction prevention services.  Also advised that she may need to contact Environmental Services/Public Health in [place].  Gave copies of [employer] and [facility].  She was working at time of breast cancer diagnosis.  States she will review these and bring supporting paperwork in next week.  CSW [place] informed.  Edwyna Shell, LCSW Clinical Social Worker Phone:  [phone] Cell:  [phone]

## 2019-03-19 ENCOUNTER — Ambulatory Visit: Payer: Medicaid Other

## 2019-03-22 ENCOUNTER — Other Ambulatory Visit: Payer: Self-pay

## 2019-03-22 ENCOUNTER — Ambulatory Visit
Admission: RE | Admit: 2019-03-22 | Discharge: 2019-03-22 | Disposition: A | Discharge status: Home / Self Care | Payer: Medicaid Other | Source: Ambulatory Visit | Attending: Radiation Oncology | Admitting: Radiation Oncology

## 2019-03-22 ENCOUNTER — Ambulatory Visit: Payer: Medicaid Other

## 2019-03-22 DIAGNOSIS — C50411 Malignant neoplasm of upper-outer quadrant of right female breast: Secondary | ICD-10-CM | POA: Diagnosis not present

## 2019-03-23 ENCOUNTER — Other Ambulatory Visit: Payer: Self-pay

## 2019-03-23 ENCOUNTER — Ambulatory Visit
Admission: RE | Admit: 2019-03-23 | Discharge: 2019-03-23 | Disposition: A | Discharge status: Home / Self Care | Payer: Medicaid Other | Source: Ambulatory Visit | Attending: Radiation Oncology | Admitting: Radiation Oncology

## 2019-03-23 ENCOUNTER — Encounter: Payer: Self-pay | Admitting: *Deleted

## 2019-03-23 DIAGNOSIS — C50411 Malignant neoplasm of upper-outer quadrant of right female breast: Secondary | ICD-10-CM | POA: Diagnosis not present

## 2019-03-24 ENCOUNTER — Telehealth: Payer: Self-pay | Admitting: Hematology and Oncology

## 2019-03-24 ENCOUNTER — Ambulatory Visit
Admission: RE | Admit: 2019-03-24 | Discharge: 2019-03-24 | Disposition: A | Discharge status: Home / Self Care | Payer: Medicaid Other | Source: Ambulatory Visit | Attending: Radiation Oncology | Admitting: Radiation Oncology

## 2019-03-24 ENCOUNTER — Other Ambulatory Visit: Payer: Self-pay

## 2019-03-24 DIAGNOSIS — C50411 Malignant neoplasm of upper-outer quadrant of right female breast: Secondary | ICD-10-CM | POA: Diagnosis not present

## 2019-03-24 NOTE — Telephone Encounter (Signed)
Per 12/29 sch message - pt is aware of appt date and time

## 2019-03-25 ENCOUNTER — Ambulatory Visit
Admission: RE | Admit: 2019-03-25 | Discharge: 2019-03-25 | Disposition: A | Discharge status: Home / Self Care | Payer: Medicaid Other | Source: Ambulatory Visit | Attending: Radiation Oncology | Admitting: Radiation Oncology

## 2019-03-25 ENCOUNTER — Ambulatory Visit: Payer: Medicaid Other | Admitting: Rehabilitation

## 2019-03-25 ENCOUNTER — Ambulatory Visit: Payer: Medicaid Other

## 2019-03-25 ENCOUNTER — Encounter: Payer: Self-pay | Admitting: *Deleted

## 2019-03-25 ENCOUNTER — Other Ambulatory Visit: Payer: Self-pay

## 2019-03-25 DIAGNOSIS — C50411 Malignant neoplasm of upper-outer quadrant of right female breast: Secondary | ICD-10-CM | POA: Diagnosis not present

## 2019-03-25 NOTE — Progress Notes (Signed)
[place] Work  Patient came by [place] office today to bring documents needed for financial assistance applications.  CSW reviewed with patient, still missing employer letter needed for [employer] application.  CSW emailed patient's employer on patient's behalf.  CSW team will fax applications once all documentation is obtained.  Gwinda Maine, LCSW  Clinical Social Worker [facility]

## 2019-03-29 ENCOUNTER — Ambulatory Visit
Admission: RE | Admit: 2019-03-29 | Discharge: 2019-03-29 | Disposition: A | Discharge status: Home / Self Care | Payer: Medicaid Other | Source: Ambulatory Visit | Attending: Radiation Oncology | Admitting: Radiation Oncology

## 2019-03-29 ENCOUNTER — Encounter: Payer: Self-pay | Admitting: *Deleted

## 2019-03-29 ENCOUNTER — Other Ambulatory Visit: Payer: Self-pay

## 2019-03-29 DIAGNOSIS — C50411 Malignant neoplasm of upper-outer quadrant of right female breast: Secondary | ICD-10-CM | POA: Diagnosis present

## 2019-03-29 DIAGNOSIS — Z170 Estrogen receptor positive status [ER+]: Secondary | ICD-10-CM | POA: Insufficient documentation

## 2019-03-29 NOTE — Progress Notes (Signed)
[place] Work  Holiday representative completed letter and submitted [place] on behalf of the patient.    Johnnye Lana, MSW, LCSW, OSW-C Clinical Social Worker [facility] [phone]

## 2019-03-29 NOTE — Progress Notes (Signed)
[place] Work  Holiday representative completed letter and submitted [place] on behalf of the patient.    Johnnye Lana, MSW, LCSW, OSW-C Clinical Social Worker [facility] [phone]

## 2019-03-30 ENCOUNTER — Ambulatory Visit
Admission: RE | Admit: 2019-03-30 | Discharge: 2019-03-30 | Disposition: A | Discharge status: Home / Self Care | Payer: Medicaid Other | Source: Ambulatory Visit | Attending: Radiation Oncology | Admitting: Radiation Oncology

## 2019-03-30 ENCOUNTER — Other Ambulatory Visit: Payer: Self-pay

## 2019-03-30 ENCOUNTER — Ambulatory Visit: Payer: Medicaid Other

## 2019-03-30 DIAGNOSIS — C50411 Malignant neoplasm of upper-outer quadrant of right female breast: Secondary | ICD-10-CM | POA: Diagnosis not present

## 2019-03-30 NOTE — Progress Notes (Signed)
Patient Care Team: Kristian Covey, MD as PCP - General (Obstetrics and Gynecology) Rico Junker, RN as Registered Nurse Theodore Demark, RN as Registered Nurse Rockwell Germany, RN as Oncology Nurse Navigator Mauro Kaufmann, RN as Oncology Nurse Navigator Jovita Kussmaul, MD as Consulting Physician (General Surgery)  DIAGNOSIS:    ICD-10-CM   1. Malignant neoplasm of upper-outer quadrant of right breast in female, estrogen receptor positive ([place])  C50.411    Z17.0     SUMMARY OF ONCOLOGIC HISTORY: Oncology History  Malignant neoplasm of upper-outer quadrant of right breast in female, estrogen receptor positive ([place])  10/21/2018 Initial Diagnosis   23-monthfollow-up of breast calcifications showed calcifications increased in size, spanning 1.5cm, with no discrete mass. Biopsy on 10/21/18 showed invasive mammary carcinoma, grade 1, HER-2 negative (0), ER+ 51-90%, PR+ >90% ([facility]   11/10/2018 Cancer Staging   Staging form: Breast, AJCC 8th Edition - Clinical stage from 11/10/2018: Stage IA (cT1c, cN0, cM0, G1, ER+, PR+, HER2-) - Signed by GNicholas Lose MD on 11/10/2018   11/25/2018 Genetic Testing   Negative genetic testing on a STAT panel.  The STAT Breast cancer panel offered by [facility] includes sequencing and rearrangement analysis for the following 9 genes:  ATM, BRCA1, BRCA2, CDH1, CHEK2, PALB2, PTEN, STK11 and TP53.   The report date is 11/18/2018.    SMARCA4 c.925C>A VUS identified on the multicancer panel.  The Multi-Gene Panel offered by [facility] includes sequencing and/or deletion duplication testing of the following 85 genes: AIP, ALK, APC, ATM, AXIN2,BAP1,  BARD1, BLM, BMPR1A, BRCA1, BRCA2, BRIP1, CASR, CDC73, CDH1, CDK4, CDKN1B, CDKN1C, CDKN2A (p14ARF), CDKN2A (p16INK4a), CEBPA, CHEK2, CTNNA1, DICER1, DIS3L2, EGFR (c.2369C>T, p.Thr790Met variant only), EPCAM (Deletion/duplication testing only), FH, FLCN, GATA2, GPC3, GREM1 (Promoter region deletion/duplication testing  only), HOXB13 (c.251G>A, p.Gly84Glu), HRAS, KIT, MAX, MEN1, MET, MITF (c.952G>A, p.Glu318Lys variant only), MLH1, MSH2, MSH3, MSH6, MUTYH, NBN, NF1, NF2, NTHL1, PALB2, PDGFRA, PHOX2B, PMS2, POLD1, POLE, POT1, PRKAR1A, PTCH1, PTEN, RAD50, RAD51C, RAD51D, RB1, RECQL4, RET, RNF43, RUNX1, SDHAF2, SDHA (sequence changes only), SDHB, SDHC, SDHD, SMAD4, SMARCA4, SMARCB1, SMARCE1, STK11, SUFU, TERC, TERT, TMEM127, TP53, TSC1, TSC2, VHL, WRN and WT1.  The report date is November 25, 2018.   12/02/2018 Surgery   Right lumpectomy ([name]: IDC with DCIS, grade 1, 1.4cm, 4 axillary lymph nodes negative   12/16/2018 Oncotype testing   Oncotype: score of 20, 6% of distant recurrence in 9 years with tamoxifen alone   01/29/2019 -  Radiation Therapy   Adjuvant radiation     CHIEF COMPLIANT: Follow-up to discuss anti-estrogen therapy  INTERVAL HISTORY: TRAEGHAN DEMETERis a 44y.o. with above-mentioned history of right breast cancer who underwent a lumpectomy and is currently undergoing radiation therapy. She presents to the clinic today for follow-up to discuss anti-estrogen therapy.  Apart from mild to moderate radiation dermatitis she is overall doing well.  ALLERGIES:  is allergic to shrimp [shellfish allergy]; penicillins; and tomato.  MEDICATIONS:  Current Outpatient Medications  Medication Sig Dispense Refill  . aspirin 81 MG chewable tablet Chew 1 tablet (81 mg total) by mouth daily. (Patient taking differently: Chew 81 mg by mouth at bedtime as needed for moderate pain. )    . chlorhexidine (PERIDEX) 0.12 % solution 15 mLs by Mouth Rinse route 2 (two) times daily.    .Marland Kitchendoxycycline (VIBRA-TABS) 100 MG tablet Take 100 mg by mouth 2 (two) times daily.    .Marland Kitchengabapentin (NEURONTIN) 100 MG capsule Take  100 mg by mouth 3 (three) times daily.    Marland Kitchen ibuprofen (ADVIL,MOTRIN) 600 MG tablet Take 600 mg by mouth every 6 (six) hours as needed for headache or moderate pain.     . medroxyPROGESTERone (DEPO-PROVERA) 150  MG/ML injection Inject 150 mg into the muscle every 3 (three) months.    . mupirocin ointment (BACTROBAN) 2 % Place 1 application into the nose 2 (two) times daily.    . nicotine (NICODERM CQ) 7 mg/24hr patch Place 1 patch (7 mg total) onto the skin daily. 28 patch 0  . oxyCODONE-acetaminophen (PERCOCET/ROXICET) 5-325 MG tablet Take 1 tablet by mouth every 6 (six) hours as needed for severe pain. 30 tablet 0  . tamoxifen (NOLVADEX) 20 MG tablet Take 1 tablet (20 mg total) by mouth daily. 90 tablet 3  . vitamin E 100 UNIT capsule Take by mouth daily.     No current facility-administered medications for this visit.    PHYSICAL EXAMINATION: ECOG PERFORMANCE STATUS: 1 - Symptomatic but completely ambulatory  Vitals:   03/31/19 1156  BP: 115/75  Pulse: (!) 57  Resp: 20  Temp: 98.3 F (36.8 C)  SpO2: 100%   Filed Weights   03/31/19 1156  Weight: 174 lb (78.9 kg)    LABORATORY DATA:  I have reviewed the data as listed CMP Latest Ref Rng & Units 12/13/2018 12/10/2018 03/31/2018  Glucose 70 - 99 mg/dL 96 101(H) 108(H)  BUN 6 - 20 mg/dL 11 11 9   Creatinine 0.44 - 1.00 mg/dL 0.78 0.78 1.09(H)  Sodium 135 - 145 mmol/L 137 140 138  Potassium 3.5 - 5.1 mmol/L 3.5 3.7 3.6  Chloride 98 - 111 mmol/L 104 107 105  CO2 22 - 32 mmol/L 25 25 27   Calcium 8.9 - 10.3 mg/dL 9.2 9.5 8.9  Total Protein 6.5 - 8.1 g/dL - - 7.3  Total Bilirubin 0.3 - 1.2 mg/dL - - 0.5  Alkaline Phos 38 - 126 U/L - - 78  AST 15 - 41 U/L - - 22  ALT 0 - 44 U/L - - 20    Lab Results  Component Value Date   WBC 5.7 12/13/2018   HGB 14.2 12/13/2018   HCT 40.9 12/13/2018   MCV 94.0 12/13/2018   PLT 253 12/13/2018   NEUTROABS 2.1 12/13/2018    ASSESSMENT & PLAN:  Malignant neoplasm of upper-outer quadrant of right breast in female, estrogen receptor positive ([place]) 12/02/2018:Right lumpectomy [name]): IDC with DCIS, grade 1, 1.4cm, 4 axillary lymph nodes negative HER-2 negative (0), ER+ 51-90%, PR+ >90% ([facility] at  [facility]) Oncotype: score of 20, 6% of distant recurrence in 9 years with tamoxifen alone (did not recommend chemotherapy) Adj XRT 01/29/19  Current treatment: Adjuvant antiestrogen therapy with tamoxifen 20 mg daily to be started in 2 weeks. Tamoxifen counseling: We discussed the risks and benefits of tamoxifen. These include but not limited to insomnia, hot flashes, mood changes, vaginal dryness, and weight gain. Although rare, serious side effects including endometrial cancer, risk of blood clots were also discussed. We strongly believe that the benefits far outweigh the risks. Patient understands these risks and consented to starting treatment. Planned treatment duration is 10 years.  Radiation Dermatitis: Mild-mod  Return to clinic in 3 months for survivorship care plan visit  No orders of the defined types were placed in this encounter.  The patient has a good understanding of the overall plan. she agrees with it. she will call with any problems that may develop before the next  visit here.  Total time spent: 30 mins including face to face time and time spent for planning, charting and coordination of care  Nicholas Lose, MD 03/31/2019  I, Cloyde Reams Dorshimer, am acting as scribe for Dr. Nicholas Lose.  I have reviewed the above documentation for accuracy and completeness, and I agree with the above.

## 2019-03-31 ENCOUNTER — Other Ambulatory Visit: Payer: Self-pay

## 2019-03-31 ENCOUNTER — Inpatient Hospital Stay: Payer: Medicaid Other | Attending: Hematology and Oncology | Admitting: Hematology and Oncology

## 2019-03-31 ENCOUNTER — Other Ambulatory Visit: Payer: Self-pay | Admitting: Radiation Oncology

## 2019-03-31 ENCOUNTER — Encounter: Payer: Self-pay | Admitting: Radiation Oncology

## 2019-03-31 ENCOUNTER — Ambulatory Visit
Admission: RE | Admit: 2019-03-31 | Discharge: 2019-03-31 | Disposition: A | Discharge status: Home / Self Care | Payer: Medicaid Other | Source: Ambulatory Visit | Attending: Radiation Oncology | Admitting: Radiation Oncology

## 2019-03-31 ENCOUNTER — Telehealth: Payer: Self-pay | Admitting: Hematology and Oncology

## 2019-03-31 ENCOUNTER — Encounter: Payer: Self-pay | Admitting: *Deleted

## 2019-03-31 DIAGNOSIS — L598 Other specified disorders of the skin and subcutaneous tissue related to radiation: Secondary | ICD-10-CM | POA: Insufficient documentation

## 2019-03-31 DIAGNOSIS — Z170 Estrogen receptor positive status [ER+]: Secondary | ICD-10-CM | POA: Diagnosis not present

## 2019-03-31 DIAGNOSIS — C50411 Malignant neoplasm of upper-outer quadrant of right female breast: Secondary | ICD-10-CM | POA: Insufficient documentation

## 2019-03-31 DIAGNOSIS — Z79899 Other long term (current) drug therapy: Secondary | ICD-10-CM | POA: Insufficient documentation

## 2019-03-31 MED ORDER — TAMOXIFEN CITRATE 20 MG PO TABS: 20.0000 mg | ORAL_TABLET | Freq: Every day | ORAL | 3 refills | Status: DC

## 2019-03-31 MED ORDER — NICOTINE 7 MG/24HR TD PT24: 7.0000 mg | MEDICATED_PATCH | Freq: Every day | TRANSDERMAL | 0 refills | Status: DC

## 2019-03-31 NOTE — Assessment & Plan Note (Signed)
12/02/2018:Right lumpectomy [name]): IDC with DCIS, grade 1, 1.4cm, 4 axillary lymph nodes negative HER-2 negative (0), ER+ 51-90%, PR+ >90% (done at [facility]) Oncotype: score of 20, 6% of distant recurrence in 9 years with tamoxifen alone (did not recommend chemotherapy) Adj XRT 01/29/19  Current treatment: Adjuvant antiestrogen therapy with tamoxifen 20 mg daily to be started in 2 weeks. Tamoxifen counseling: We discussed the risks and benefits of tamoxifen. These include but not limited to insomnia, hot flashes, mood changes, vaginal dryness, and weight gain. Although rare, serious side effects including endometrial cancer, risk of blood clots were also discussed. We strongly believe that the benefits far outweigh the risks. Patient understands these risks and consented to starting treatment. Planned treatment duration is 10 years.  Return to clinic in 3 months for survivorship care plan visit

## 2019-03-31 NOTE — Telephone Encounter (Signed)
I talk with patient regarding schedule  

## 2019-04-01 ENCOUNTER — Telehealth: Payer: Self-pay | Admitting: General Practice

## 2019-04-01 NOTE — Telephone Encounter (Signed)
[place] CSW Progress Notes  Call to patient - she needs letter from employer verifying her last day of work and her current status w this employer.  Letter is final document needed for [employer] application.  Per email from Leia Alf at [employer], they require written request from employee in order to release this letter.  Patient informed and asked to work w her employer to obtain this letter in order to apply for [employer].  Edwyna Shell, LCSW Clinical Social Worker Phone:  [phone]

## 2019-04-07 ENCOUNTER — Encounter: Payer: Self-pay | Admitting: *Deleted

## 2019-04-07 NOTE — Progress Notes (Signed)
CSW mailed [employer] application on patient's behalf.  Maryjean Morn, MSW, LCSW, OSW-C Clinical Social Worker [facility] [phone]

## 2019-04-21 ENCOUNTER — Telehealth: Payer: Self-pay | Admitting: Radiation Oncology

## 2019-04-21 ENCOUNTER — Ambulatory Visit: Payer: Medicaid Other

## 2019-04-21 NOTE — Telephone Encounter (Signed)
  Radiation Oncology         [phone] ________________________________  Name: Carol Crawford MRN: [id number]  Date of Service: 04/21/2019  DOB: 02-11-1976  Post Treatment Telephone Note  Diagnosis:   Stage IA, pT1cN0M0 grade 1, ER/PR positive invasive ductal carcinoma of the right breast.  Interval Since Last Radiation:  3 weeks   01/28/2019-03/31/2019: The right breast was treated to 50.4 Gy in 28 fractions followed by a 10 Gy boost in 5 fractions to the surgical cavity.  Narrative:  The patient was contacted today for routine follow-up. During treatment she did very well with radiotherapy and did not have significant desquamation. She reports she is doing pretty well, she still has pigmented change of the skin of the breast, she reports that it is been slightly tight and dry, and has been using several kinds of creams to moisturize..  Impression/Plan: 1. Stage IA, pT1cN0M0 grade 1, ER/PR positive invasive ductal carcinoma of the right breast.. The patient has been doing well since completion of radiotherapy. We discussed that we would be happy to continue to follow her as needed, but she will also continue to follow up with Dr. Lindi Adie in medical oncology. She was counseled on skin care as well as measures to avoid sun exposure to this area.  2. Survivorship. We discussed the importance of survivorship evaluation and encouraged her to attend her appointment in April.    Carola Rhine, PAC

## 2019-05-11 NOTE — Progress Notes (Signed)
  Radiation Oncology         [phone] ________________________________  Name: Carol Crawford MRN: [id number]  Date: 03/31/2019  DOB: 07-Oct-1975  End of Treatment Note  Diagnosis:   right-sided breast cancer     Indication for treatment:  Curative       Radiation treatment dates:   01/28/19 - 03/31/19  Site/dose:   The patient initially received a dose of 50.4 Gy in 28 fractions to the breast using whole-breast tangent fields. This was delivered using a 3-D conformal technique. The patient then received a boost to the seroma. This delivered an additional 10 Gy in 5 fractions using a 3-field photon boost technique. The total dose was 60.4 Gy.  Narrative: The patient tolerated radiation treatment relatively well.   The patient had some expected skin irritation as she progressed during treatment. Moist desquamation was not present at the end of treatment.  Plan: The patient has completed radiation treatment. The patient will return to radiation oncology clinic for routine followup in one month. I advised the patient to call or return sooner if they have any questions or concerns related to their recovery or treatment. ________________________________  Jodelle Gross, M.D., Ph.D.

## 2019-05-20 ENCOUNTER — Telehealth: Payer: Self-pay | Admitting: Adult Health

## 2019-05-20 NOTE — Telephone Encounter (Signed)
Rescheduled per provider. Called and left a msg. Mailing printout  

## 2019-06-09 ENCOUNTER — Encounter: Payer: Self-pay | Admitting: General Practice

## 2019-06-09 NOTE — Progress Notes (Signed)
[place] CSW Progress Notes  Patient cannot pay her rent - concerned about possible eviction.  Referred to housing mediation/eviction prevention resources via [facility] [employer] [facility].  Patient can also call [place] [facility] for additional help. She did receive funding from [facility].  Her application to [employer] was denied as she was not considered to be in active treatment during the funding period.  She reports that she has been approved for disability and will meet w them tomorrow.  Edwyna Shell, LCSW Clinical Social Worker Phone:  [phone]

## 2019-06-10 ENCOUNTER — Telehealth: Payer: Self-pay

## 2019-06-10 ENCOUNTER — Other Ambulatory Visit: Payer: Self-pay | Admitting: Hematology and Oncology

## 2019-06-10 NOTE — Telephone Encounter (Signed)
Spoke with patient she needs refill on her nicotine patches. Will discuss with Bryson Ha.

## 2019-06-23 NOTE — Telephone Encounter (Signed)
Dr. Lisbeth Renshaw, Sending this controlled prescription to you.   Gardiner Rhyme, RN

## 2019-07-08 ENCOUNTER — Encounter: Payer: Medicaid Other | Admitting: Adult Health

## 2019-07-21 NOTE — Progress Notes (Deleted)
SURVIVORSHIP VIRTUAL VISIT:  I connected with *** on 07/21/19 at 10:30 AM EDT by *** and verified that I am speaking with the correct person using two identifiers.  I discussed the limitations, risks, security and privacy concerns of performing an evaluation and management service by telephone and the availability of in person appointments. I also discussed with the patient that there may be a patient responsible charge related to this service. The patient expressed understanding and agreed to proceed.   BRIEF ONCOLOGIC HISTORY:  Oncology History  Malignant neoplasm of upper-outer quadrant of right breast in female, estrogen receptor positive ([place])  10/21/2018 Initial Diagnosis   42-monthfollow-up of breast calcifications showed calcifications increased in size, spanning 1.5cm, with no discrete mass. Biopsy on 10/21/18 showed invasive mammary carcinoma, grade 1, HER-2 negative (0), ER+ 51-90%, PR+ >90% [facility]   11/10/2018 Cancer Staging   Staging form: Breast, AJCC 8th Edition - Clinical stage from 11/10/2018: Stage IA (cT1c, cN0, cM0, G1, ER+, PR+, HER2-)   11/25/2018 Genetic Testing   Negative genetic testing on a STAT panel.  The STAT Breast cancer panel offered by [facility] includes sequencing and rearrangement analysis for the following 9 genes:  ATM, BRCA1, BRCA2, CDH1, CHEK2, PALB2, PTEN, STK11 and TP53.   The report date is 11/18/2018.    SMARCA4 c.925C>A VUS identified on the multicancer panel.  The Multi-Gene Panel offered by [facility] includes sequencing and/or deletion duplication testing of the following 85 genes: AIP, ALK, APC, ATM, AXIN2,BAP1,  BARD1, BLM, BMPR1A, BRCA1, BRCA2, BRIP1, CASR, CDC73, CDH1, CDK4, CDKN1B, CDKN1C, CDKN2A (p14ARF), CDKN2A (p16INK4a), CEBPA, CHEK2, CTNNA1, DICER1, DIS3L2, EGFR (c.2369C>T, p.Thr790Met variant only), EPCAM (Deletion/duplication testing only), FH, FLCN, GATA2, GPC3, GREM1 (Promoter region deletion/duplication testing only), HOXB13 (c.251G>A,  p.Gly84Glu), HRAS, KIT, MAX, MEN1, MET, MITF (c.952G>A, p.Glu318Lys variant only), MLH1, MSH2, MSH3, MSH6, MUTYH, NBN, NF1, NF2, NTHL1, PALB2, PDGFRA, PHOX2B, PMS2, POLD1, POLE, POT1, PRKAR1A, PTCH1, PTEN, RAD50, RAD51C, RAD51D, RB1, RECQL4, RET, RNF43, RUNX1, SDHAF2, SDHA (sequence changes only), SDHB, SDHC, SDHD, SMAD4, SMARCA4, SMARCB1, SMARCE1, STK11, SUFU, TERC, TERT, TMEM127, TP53, TSC1, TSC2, VHL, WRN and WT1.  The report date is November 25, 2018.   12/02/2018 Surgery   Right lumpectomy ([name] ([phone]: IDC with DCIS, grade 1, 1.4cm, 4 axillary lymph nodes negative   12/15/2018 Oncotype testing   Oncotype: score of 20, 6% of distant recurrence in 9 years with tamoxifen alone   01/28/2019 - 03/31/2019 Radiation Therapy   The patient initially received a dose of 50.4 Gy in 28 fractions to the breast using whole-breast tangent fields. This was delivered using a 3-D conformal technique. The patient then received a boost to the seroma. This delivered an additional 10 Gy in 5 fractions using a 3-field photon boost technique. The total dose was 60.4 Gy.   03/2019 - 03/2024 Anti-estrogen oral therapy   Tamoxifen     INTERVAL HISTORY:  Ms. DGranholmto review her survivorship care plan detailing her treatment course for breast cancer, as well as monitoring long-term side effects of that treatment, education regarding health maintenance, screening, and overall wellness and health promotion.     Overall, Ms. Verma reports feeling quite well   REVIEW OF SYSTEMS:  Review of Systems - Oncology Breast: Denies any new nodularity, masses, tenderness, nipple changes, or nipple discharge.      ONCOLOGY TREATMENT TEAM:  1. [facility] 2. Medical Oncologist: Dr. *Marland Kitchen 3. Radiation Oncologist: Dr. *Marland Kitchen   PAST  MEDICAL/SURGICAL HISTORY:  Past Medical History:  Diagnosis Date  . ADHD   . Bipolar 1 disorder ([place])   . Depressed   . Drug overdose 2010  . Family history of  breast cancer   . Family history of colon cancer   . Family history of prostate cancer   . GERD (gastroesophageal reflux disease)    OCC  . Headache    MIGRAINES  . History of MRSA infection    foot infection  . Migraine   . Schizophrenia ([place])   . Stroke [facility])    after foot surgery, left side was numb, fully recovered   Past Surgical History:  Procedure Laterality Date  . BREAST BIOPSY Right 10/21/2018   Affirm Bx-"X" clip-path pending  . BREAST LUMPECTOMY WITH RADIOACTIVE SEED AND SENTINEL LYMPH NODE BIOPSY Right 12/02/2018   Procedure: RIGHT BREAST LUMPECTOMY WITH RADIOACTIVE SEED AND SENTINEL LYMPH NODE BIOPSY;  Surgeon: Jovita Kussmaul, MD;  Location: [place];  Service: General;  Laterality: Right;  . INCISION AND DRAINAGE Left 01/09/2018   Procedure: INCISION AND DRAINAGE-below fascia foot; multiple;  Surgeon: [name], DPM;  Location: [facility];  Service: Podiatry;  Laterality: Left;  . INCISION AND DRAINAGE Left 04/17/2018   Procedure: I&D; BELOW FASCIA FOOT; MULTIPLE;  Surgeon: [name], DPM;  Location: [facility];  Service: Podiatry;  Laterality: Left;  . INCISION AND DRAINAGE Right 12/18/2018   Procedure: PLACEMENT OF DRAIN IN RIGHT AXILLA;  Surgeon: Jovita Kussmaul, MD;  Location: [place];  Service: General;  Laterality: Right;  . REPAIR EXTENSOR TENDON Left 09/05/2017   Procedure: REPAIR EXTENSOR TENDON;  Surgeon: [name], DPM;  Location: [facility];  Service: Podiatry;  Laterality: Left;     ALLERGIES:  Allergies  Allergen Reactions  . Shrimp [Shellfish Allergy] Hives  . Penicillins Hives and Other (See Comments)    Has patient had a PCN reaction causing immediate rash, facial/tongue/throat swelling, SOB or lightheadedness with hypotension: No Has patient had a PCN reaction causing severe rash involving mucus membranes or skin necrosis: Yes Has patient had a PCN reaction that required hospitalization: No Has patient had a PCN reaction  occurring within the last 10 years: Yes If all of the above answers are "NO", then may proceed with Cephalosporin use.   . Tomato Rash     CURRENT MEDICATIONS:  Outpatient Encounter Medications as of 07/23/2019  Medication Sig Note  . aspirin 81 MG chewable tablet Chew 1 tablet (81 mg total) by mouth daily. (Patient taking differently: Chew 81 mg by mouth at bedtime as needed for moderate pain. )   . chlorhexidine (PERIDEX) 0.12 % solution 15 mLs by Mouth Rinse route 2 (two) times daily.   Marland Kitchen doxycycline (VIBRA-TABS) 100 MG tablet Take 100 mg by mouth 2 (two) times daily. 12/10/2018: Will finish course on Oct 1  . gabapentin (NEURONTIN) 100 MG capsule Take 100 mg by mouth 3 (three) times daily.   Marland Kitchen ibuprofen (ADVIL,MOTRIN) 600 MG tablet Take 600 mg by mouth every 6 (six) hours as needed for headache or moderate pain.    . medroxyPROGESTERone (DEPO-PROVERA) 150 MG/ML injection Inject 150 mg into the muscle every 3 (three) months. 12/13/2018: Due for one this month, september  . mupirocin ointment (BACTROBAN) 2 % Place 1 application into the nose 2 (two) times daily.   . nicotine (NICODERM CQ - DOSED IN MG/24 HR) 7 mg/24hr patch APPLY 1 PATCH TOPICALLY ONCE DAILY   . oxyCODONE-acetaminophen (PERCOCET/ROXICET) 5-325 MG tablet  Take 1 tablet by mouth every 6 (six) hours as needed for severe pain.   . tamoxifen (NOLVADEX) 20 MG tablet Take 1 tablet (20 mg total) by mouth daily.   . vitamin E 100 UNIT capsule Take by mouth daily.    No facility-administered encounter medications on file as of 07/23/2019.     ONCOLOGIC FAMILY HISTORY:  Family History  Problem Relation Age of Onset  . Arthritis Mother   . Hypertension Mother   . Cervical cancer Mother   . Prostate cancer Father   . Lung cancer Father   . Diabetes Paternal Aunt   . Breast cancer Paternal Aunt   . ADD / ADHD Daughter   . Bipolar disorder Daughter   . Hypertension Maternal Grandmother   . Stroke Maternal Grandmother   .  Prostate cancer Maternal Grandfather   . Hypertension Maternal Grandfather   . Colon cancer Maternal Grandfather   . Hypertension Paternal Grandfather   . Stroke Paternal Grandfather   . Breast cancer Cousin      GENETIC COUNSELING/TESTING: ***  SOCIAL HISTORY:  Social History   Socioeconomic History  . Marital status: Single    Spouse name: Not on file  . Number of children: Not on file  . Years of education: Not on file  . Highest education level: Not on file  Occupational History  . Not on file  Tobacco Use  . Smoking status: Current Some Day Smoker    Packs/day: 0.25    Years: 23.00    Pack years: 5.75    Types: Cigarettes  . Smokeless tobacco: Never Used  . Tobacco comment: 2 cigs daily  Substance and Sexual Activity  . Alcohol use: Yes    Comment: occasionally  . Drug use: Yes    Types: Marijuana    Comment: 1-2x week  . Sexual activity: Yes  Other Topics Concern  . Not on file  Social History Narrative  . Not on file   Social Determinants of Health   Financial Resource Strain:   . Difficulty of Paying Living Expenses:   Food Insecurity:   . Worried About Charity fundraiser in the Last Year:   . Arboriculturist in the Last Year:   Transportation Needs: Unmet Transportation Needs  . Lack of Transportation (Medical): Yes  . Lack of Transportation (Non-Medical): Yes  Physical Activity:   . Days of Exercise per Week:   . Minutes of Exercise per Session:   Stress:   . Feeling of Stress :   Social Connections:   . Frequency of Communication with Friends and Family:   . Frequency of Social Gatherings with Friends and Family:   . Attends Religious Services:   . Active Member of Clubs or Organizations:   . Attends Archivist Meetings:   Marland Kitchen Marital Status:   Intimate Partner Violence:   . Fear of Current or Ex-Partner:   . Emotionally Abused:   Marland Kitchen Physically Abused:   . Sexually Abused:      OBSERVATIONS/OBJECTIVE:   LABORATORY DATA:   None for this visit.  DIAGNOSTIC IMAGING:  None for this visit.      ASSESSMENT AND PLAN:  Ms.. Merrihew is a pleasant 44 y.o. female with Stage *** right/left breast invasive ductal carcinoma, ER+/PR+/HER2-, diagnosed in ***, treated with lumpectomy, adjuvant radiation therapy, and anti-estrogen therapy with *** beginning in ***.  She presents to the Survivorship Clinic for our initial meeting and routine follow-up post-completion of treatment for  breast cancer.    1. Stage *** right/left breast cancer:  Ms. Cake is continuing to recover from definitive treatment for breast cancer. She will follow-up with her medical oncologist, Dr. Ross Ludwig in *** with history and physical exam per surveillance protocol.  She will continue her anti-estrogen therapy with ***. Thus far, she is tolerating the *** well, with minimal side effects. She was instructed to make Dr. Lindi Adie or myself aware if she begins to experience any worsening side effects of the medication and I could see her back in clinic to help manage those side effects, as needed. Her mammogram is due ***; orders placed today.  Her breast density is category ***. Today, a comprehensive survivorship care plan and treatment summary was reviewed with the patient today detailing her breast cancer diagnosis, treatment course, potential late/long-term effects of treatment, appropriate follow-up care with recommendations for the future, and patient education resources.  A copy of this summary, along with a letter will be sent to the patient's primary care provider via mail/fax/In Basket message after today's visit.    #. Problem(s) at Visit______________  #. Bone health:  Given Ms. Krakow's age/history of breast cancer and her current treatment regimen including anti-estrogen therapy with ***, she is at risk for bone demineralization.  Her last DEXA scan was ***, which showed ***.  In the meantime, she was encouraged to increase her consumption of  foods rich in calcium, as well as increase her weight-bearing activities.  She was given education on specific activities to promote bone health.  #. Cancer screening:  Due to Ms. Dowson's history and her age, she should receive screening for skin cancers, colon cancer, and gynecologic cancers.  The information and recommendations are listed on the patient's comprehensive care plan/treatment summary and were reviewed in detail with the patient.    #. Health maintenance and wellness promotion: Ms. Tiede was encouraged to consume 5-7 servings of fruits and vegetables per day. We reviewed the "Nutrition Rainbow" handout, as well as the handout "Take Control of Your Health and Reduce Your Cancer Risk" from the [place].  She was also encouraged to engage in moderate to vigorous exercise for 30 minutes per day most days of the week. We discussed the [employer] fitness program, which is designed for cancer survivors to help them become more physically fit after cancer treatments.  She was instructed to limit her alcohol consumption and continue to abstain from tobacco use/***was encouraged stop smoking.     #. Support services/counseling: It is not uncommon for this period of the patient's cancer care trajectory to be one of many emotions and stressors.  We discussed how this can be increasingly difficult during the times of quarantine and social distancing due to the COVID-19 pandemic.   She was given information regarding our available services and encouraged to contact me with any questions or for help enrolling in any of our support group/programs.    Follow up instructions:    -Return to cancer center ***  -Mammogram due in *** -Follow up with surgery *** -She is welcome to return back to the Survivorship Clinic at any time; no additional follow-up needed at this time.  -Consider referral back to survivorship as a long-term survivor for continued surveillance  The patient was  provided an opportunity to ask questions and all were answered. The patient agreed with the plan and demonstrated an understanding of the instructions.   The patient was advised to call back or seek an in-person  evaluation if the symptoms worsen or if the condition fails to improve as anticipated.   I provided *** minutes of {Blank single:19197::"face-to-face video visit time","non face-to-face telephone visit time"} during this encounter, and > 50% was spent counseling as documented under my assessment & plan.  Scot Dock, NP

## 2019-07-23 ENCOUNTER — Inpatient Hospital Stay: Payer: Medicaid Other | Admitting: Adult Health

## 2019-07-28 ENCOUNTER — Inpatient Hospital Stay: Payer: Medicaid Other | Attending: Adult Health | Admitting: Adult Health

## 2019-07-28 ENCOUNTER — Other Ambulatory Visit: Payer: Self-pay

## 2019-07-28 VITALS — BP 108/73 | HR 66 | Temp 98.3°F | Resp 18 | Ht 67.0 in | Wt 171.7 lb

## 2019-07-28 DIAGNOSIS — Z923 Personal history of irradiation: Secondary | ICD-10-CM | POA: Insufficient documentation

## 2019-07-28 DIAGNOSIS — Z79810 Long term (current) use of selective estrogen receptor modulators (SERMs): Secondary | ICD-10-CM | POA: Insufficient documentation

## 2019-07-28 DIAGNOSIS — N951 Menopausal and female climacteric states: Secondary | ICD-10-CM | POA: Diagnosis not present

## 2019-07-28 DIAGNOSIS — Z79899 Other long term (current) drug therapy: Secondary | ICD-10-CM | POA: Insufficient documentation

## 2019-07-28 DIAGNOSIS — Z170 Estrogen receptor positive status [ER+]: Secondary | ICD-10-CM | POA: Diagnosis not present

## 2019-07-28 DIAGNOSIS — C50411 Malignant neoplasm of upper-outer quadrant of right female breast: Secondary | ICD-10-CM | POA: Diagnosis not present

## 2019-07-28 DIAGNOSIS — G479 Sleep disorder, unspecified: Secondary | ICD-10-CM | POA: Insufficient documentation

## 2019-07-28 MED ORDER — VENLAFAXINE HCL ER 37.5 MG PO CP24: 37.5000 mg | ORAL_CAPSULE | Freq: Every day | ORAL | 12 refills | Status: DC

## 2019-07-28 NOTE — Progress Notes (Signed)
SURVIVORSHIP VISIT:   BRIEF ONCOLOGIC HISTORY:  Oncology History  Malignant neoplasm of upper-outer quadrant of right breast in female, estrogen receptor positive ([place])  10/21/2018 Initial Diagnosis   22-monthfollow-up of breast calcifications showed calcifications increased in size, spanning 1.5cm, with no discrete mass. Biopsy on 10/21/18 showed invasive mammary carcinoma, grade 1, HER-2 negative (0), ER+ 51-90%, PR+ >90% [facility]   11/10/2018 Cancer Staging   Staging form: Breast, AJCC 8th Edition - Clinical stage from 11/10/2018: Stage IA (cT1c, cN0, cM0, G1, ER+, PR+, HER2-)   11/25/2018 Genetic Testing   Negative genetic testing on a STAT panel.  The STAT Breast cancer panel offered by [facility] includes sequencing and rearrangement analysis for the following 9 genes:  ATM, BRCA1, BRCA2, CDH1, CHEK2, PALB2, PTEN, STK11 and TP53.   The report date is 11/18/2018.    SMARCA4 c.925C>A VUS identified on the multicancer panel.  The Multi-Gene Panel offered by [facility] includes sequencing and/or deletion duplication testing of the following 85 genes: AIP, ALK, APC, ATM, AXIN2,BAP1,  BARD1, BLM, BMPR1A, BRCA1, BRCA2, BRIP1, CASR, CDC73, CDH1, CDK4, CDKN1B, CDKN1C, CDKN2A (p14ARF), CDKN2A (p16INK4a), CEBPA, CHEK2, CTNNA1, DICER1, DIS3L2, EGFR (c.2369C>T, p.Thr790Met variant only), EPCAM (Deletion/duplication testing only), FH, FLCN, GATA2, GPC3, GREM1 (Promoter region deletion/duplication testing only), HOXB13 (c.251G>A, p.Gly84Glu), HRAS, KIT, MAX, MEN1, MET, MITF (c.952G>A, p.Glu318Lys variant only), MLH1, MSH2, MSH3, MSH6, MUTYH, NBN, NF1, NF2, NTHL1, PALB2, PDGFRA, PHOX2B, PMS2, POLD1, POLE, POT1, PRKAR1A, PTCH1, PTEN, RAD50, RAD51C, RAD51D, RB1, RECQL4, RET, RNF43, RUNX1, SDHAF2, SDHA (sequence changes only), SDHB, SDHC, SDHD, SMAD4, SMARCA4, SMARCB1, SMARCE1, STK11, SUFU, TERC, TERT, TMEM127, TP53, TSC1, TSC2, VHL, WRN and WT1.  The report date is November 25, 2018.   12/02/2018 Surgery   Right  lumpectomy ([name] [phone]: IDC with DCIS, grade 1, 1.4cm, 4 axillary lymph nodes negative   12/15/2018 Oncotype testing   Oncotype: score of 20, 6% of distant recurrence in 9 years with tamoxifen alone   01/28/2019 - 03/31/2019 Radiation Therapy   The patient initially received a dose of 50.4 Gy in 28 fractions to the breast using whole-breast tangent fields. This was delivered using a 3-D conformal technique. The patient then received a boost to the seroma. This delivered an additional 10 Gy in 5 fractions using a 3-field photon boost technique. The total dose was 60.4 Gy.   03/2019 - 03/2024 Anti-estrogen oral therapy   Tamoxifen     INTERVAL HISTORY:  Ms. DKilgourto review her survivorship care plan detailing her treatment course for breast cancer, as well as monitoring long-term side effects of that treatment, education regarding health maintenance, screening, and overall wellness and health promotion.     Overall, Ms. Tavella reports feeling moderately well.  She is taking tamoxifen daily.  She does have issues with distress, with memory and attention, fatigue, difficulty sleeping secondary to hot flashes.  She wants something to take for the hot flashes.  She notes that when she took gabapentin she felt like a zombie.  She has pain in her breast and is taking Ibuprofen for this.  Otherwise she is doing well today.   REVIEW OF SYSTEMS:  Review of Systems  Constitutional: Negative for appetite change, chills, fatigue, fever and unexpected weight change.  HENT:   Negative for hearing loss, lump/mass, sore throat and trouble swallowing.   Eyes: Negative for eye problems and icterus.  Respiratory: Negative for chest tightness, cough and shortness of breath.   Cardiovascular: Negative for chest pain, leg swelling and palpitations.  Gastrointestinal: Negative for abdominal distention, abdominal pain, constipation, diarrhea, nausea and vomiting.  Endocrine: Positive for hot flashes.  Genitourinary:  Negative for difficulty urinating.   Musculoskeletal: Negative for arthralgias.  Skin: Negative for itching and rash.  Neurological: Negative for dizziness, extremity weakness, headaches and numbness.  Hematological: Negative for adenopathy. Does not bruise/bleed easily.  Psychiatric/Behavioral: Positive for sleep disturbance. Negative for depression. The patient is not nervous/anxious.   Breast: Denies any new nodularity, masses, tenderness, nipple changes, or nipple discharge.      ONCOLOGY TREATMENT TEAM:  1. Surgeon:  Dr. [name] at [facility] 2. Medical Oncologist: Dr. Lindi Adie  3. Radiation Oncologist: Dr. Lisbeth Renshaw    PAST MEDICAL/SURGICAL HISTORY:  Past Medical History:  Diagnosis Date  . ADHD   . Bipolar 1 disorder ([place])   . Depressed   . Drug overdose 2010  . Family history of breast cancer   . Family history of colon cancer   . Family history of prostate cancer   . GERD (gastroesophageal reflux disease)    OCC  . Headache    MIGRAINES  . History of MRSA infection    foot infection  . Migraine   . Schizophrenia ([place])   . Stroke [facility])    after foot surgery, left side was numb, fully recovered   Past Surgical History:  Procedure Laterality Date  . BREAST BIOPSY Right 10/21/2018   Affirm Bx-"X" clip-path pending  . BREAST LUMPECTOMY WITH RADIOACTIVE SEED AND SENTINEL LYMPH NODE BIOPSY Right 12/02/2018   Procedure: RIGHT BREAST LUMPECTOMY WITH RADIOACTIVE SEED AND SENTINEL LYMPH NODE BIOPSY;  Surgeon: Jovita Kussmaul, MD;  Location: [place];  Service: General;  Laterality: Right;  . INCISION AND DRAINAGE Left 01/09/2018   Procedure: INCISION AND DRAINAGE-below fascia foot; multiple;  Surgeon: [name], DPM;  Location: [facility];  Service: Podiatry;  Laterality: Left;  . INCISION AND DRAINAGE Left 04/17/2018   Procedure: I&D; BELOW FASCIA FOOT; MULTIPLE;  Surgeon: [name], DPM;  Location: [facility];  Service: Podiatry;  Laterality:  Left;  . INCISION AND DRAINAGE Right 12/18/2018   Procedure: PLACEMENT OF DRAIN IN RIGHT AXILLA;  Surgeon: Jovita Kussmaul, MD;  Location: [place];  Service: General;  Laterality: Right;  . REPAIR EXTENSOR TENDON Left 09/05/2017   Procedure: REPAIR EXTENSOR TENDON;  Surgeon: [name], DPM;  Location: [facility];  Service: Podiatry;  Laterality: Left;     ALLERGIES:  Allergies  Allergen Reactions  . Shrimp [Shellfish Allergy] Hives  . Penicillins Hives and Other (See Comments)    Has patient had a PCN reaction causing immediate rash, facial/tongue/throat swelling, SOB or lightheadedness with hypotension: No Has patient had a PCN reaction causing severe rash involving mucus membranes or skin necrosis: Yes Has patient had a PCN reaction that required hospitalization: No Has patient had a PCN reaction occurring within the last 10 years: Yes If all of the above answers are "NO", then may proceed with Cephalosporin use.   . Tomato Rash     CURRENT MEDICATIONS:  Outpatient Encounter Medications as of 07/28/2019  Medication Sig Note  . aspirin 81 MG chewable tablet Chew 1 tablet (81 mg total) by mouth daily. (Patient taking differently: Chew 81 mg by mouth at bedtime as needed for moderate pain. )   . chlorhexidine (PERIDEX) 0.12 % solution 15 mLs by Mouth Rinse route 2 (two) times daily.   Marland Kitchen doxycycline (VIBRA-TABS) 100 MG tablet Take 100 mg by mouth 2 (two) times daily. 12/10/2018:  Will finish course on Oct 1  . gabapentin (NEURONTIN) 100 MG capsule Take 100 mg by mouth 3 (three) times daily.   Marland Kitchen ibuprofen (ADVIL,MOTRIN) 600 MG tablet Take 600 mg by mouth every 6 (six) hours as needed for headache or moderate pain.    . medroxyPROGESTERone (DEPO-PROVERA) 150 MG/ML injection Inject 150 mg into the muscle every 3 (three) months. 12/13/2018: Due for one this month, september  . mupirocin ointment (BACTROBAN) 2 % Place 1 application into the nose 2 (two) times daily.   . nicotine (NICODERM CQ -  DOSED IN MG/24 HR) 7 mg/24hr patch APPLY 1 PATCH TOPICALLY ONCE DAILY   . oxyCODONE-acetaminophen (PERCOCET/ROXICET) 5-325 MG tablet Take 1 tablet by mouth every 6 (six) hours as needed for severe pain.   . tamoxifen (NOLVADEX) 20 MG tablet Take 1 tablet (20 mg total) by mouth daily.   . vitamin E 100 UNIT capsule Take by mouth daily.    No facility-administered encounter medications on file as of 07/28/2019.     ONCOLOGIC FAMILY HISTORY:  Family History  Problem Relation Age of Onset  . Arthritis Mother   . Hypertension Mother   . Cervical cancer Mother   . Prostate cancer Father   . Lung cancer Father   . Diabetes Paternal Aunt   . Breast cancer Paternal Aunt   . ADD / ADHD Daughter   . Bipolar disorder Daughter   . Hypertension Maternal Grandmother   . Stroke Maternal Grandmother   . Prostate cancer Maternal Grandfather   . Hypertension Maternal Grandfather   . Colon cancer Maternal Grandfather   . Hypertension Paternal Grandfather   . Stroke Paternal Grandfather   . Breast cancer Cousin      GENETIC COUNSELING/TESTING: See above  SOCIAL HISTORY:  Social History   Socioeconomic History  . Marital status: Single    Spouse name: Not on file  . Number of children: Not on file  . Years of education: Not on file  . Highest education level: Not on file  Occupational History  . Not on file  Tobacco Use  . Smoking status: Current Some Day Smoker    Packs/day: 0.25    Years: 23.00    Pack years: 5.75    Types: Cigarettes  . Smokeless tobacco: Never Used  . Tobacco comment: 2 cigs daily  Substance and Sexual Activity  . Alcohol use: Yes    Comment: occasionally  . Drug use: Yes    Types: Marijuana    Comment: 1-2x week  . Sexual activity: Yes  Other Topics Concern  . Not on file  Social History Narrative  . Not on file   Social Determinants of Health   Financial Resource Strain:   . Difficulty of Paying Living Expenses:   Food Insecurity:   . Worried  About Charity fundraiser in the Last Year:   . Arboriculturist in the Last Year:   Transportation Needs: Unmet Transportation Needs  . Lack of Transportation (Medical): Yes  . Lack of Transportation (Non-Medical): Yes  Physical Activity:   . Days of Exercise per Week:   . Minutes of Exercise per Session:   Stress:   . Feeling of Stress :   Social Connections:   . Frequency of Communication with Friends and Family:   . Frequency of Social Gatherings with Friends and Family:   . Attends Religious Services:   . Active Member of Clubs or Organizations:   . Attends Archivist  Meetings:   Marland Kitchen Marital Status:   Intimate Partner Violence:   . Fear of Current or Ex-Partner:   . Emotionally Abused:   Marland Kitchen Physically Abused:   . Sexually Abused:      OBSERVATIONS/OBJECTIVE:  There were no vitals taken for this visit. GENERAL: Patient is a well appearing female in no acute distress HEENT:  Sclerae anicteric.  Oropharynx clear and moist. No ulcerations or evidence of oropharyngeal candidiasis. Neck is supple.  NODES:  No cervical, supraclavicular, or axillary lymphadenopathy palpated.  BREAST EXAM:  Right axillary with cording present, + tenderness,right breast is s/p lumpectomy with no sign of local recurrence, left breast benign. LUNGS:  Clear to auscultation bilaterally.  No wheezes or rhonchi. HEART:  Regular rate and rhythm. No murmur appreciated. ABDOMEN:  Soft, nontender.  Positive, normoactive bowel sounds. No organomegaly palpated. MSK:  No focal spinal tenderness to palpation. Full range of motion bilaterally in the upper extremities. EXTREMITIES:  No peripheral edema.   SKIN:  Clear with no obvious rashes or skin changes. No nail dyscrasia. NEURO:  Nonfocal. Well oriented.  Appropriate affect.    LABORATORY DATA:  None for this visit.  DIAGNOSTIC IMAGING:  None for this visit.      ASSESSMENT AND PLAN:  Ms.. Langton is a pleasant 44 y.o. female with Stage IA right  breast invasive ductal carcinoma, ER+/PR+/HER2-, diagnosed in 09/2018, treated with lumpectomy, adjuvant radiation therapy, and anti-estrogen therapy with Tamoxifne beginning in 03/2019.  She presents to the Survivorship Clinic for our initial meeting and routine follow-up post-completion of treatment for breast cancer.    1. Stage IA right breast cancer:  Ms. Stay is continuing to recover from definitive treatment for breast cancer. She will follow-up with her medical oncologist, Dr. Heath Gold history and physical exam per surveillance protocol.  She will continue her anti-estrogen therapy with Tamoxifen. Thus far, she is tolerating the Tamoxifen well, with minimal side effects. She was instructed to make Dr. Lindi Adie or myself aware if she begins to experience any worsening side effects of the medication and I could see her back in clinic to help manage those side effects, as needed. Her mammogram is due 09/2019; orders placed today. Today, a comprehensive survivorship care plan and treatment summary was reviewed with the patient today detailing her breast cancer diagnosis, treatment course, potential late/long-term effects of treatment, appropriate follow-up care with recommendations for the future, and patient education resources.  A copy of this summary, along with a letter will be sent to the patient's primary care provider via mail/fax/In Basket message after today's visit.    2. Axillary Web Syndrome, breast pain: Referral to PT placed.  3.  Positive distress screen: referral placed to social work, QOL-CSV completed.  Patient started on effexor for hot flashes, may help this as well.  4.  Hot flashes: Risk and benefit of Effexor reviewed with patient and she will start at 37.5 mg daily and increase to 2 tabs daily after a week.  She knows to call if she has any adverse effects.    5. Cognitive Dysfunction: Reviewed neuro oncology.  She will call me If she wants referral.  6.  Sleep  Disturbance:  Likely secondary to hot flashes as night.  Hopefully the effexor will help some, and I did recommend she get back in with PCP for sleep study.    7. Bone health:   She was given education on specific activities to promote bone health.  8. Cancer screening:  Due to  Ms. Elicker history and her age, she should receive screening for skin cancers, colon cancer, and gynecologic cancers.  The information and recommendations are listed on the patient's comprehensive care plan/treatment summary and were reviewed in detail with the patient.    9. Health maintenance and wellness promotion: Ms. Borras was encouraged to consume 5-7 servings of fruits and vegetables per day. We reviewed the "Nutrition Rainbow" handout, as well as the handout "Take Control of Your Health and Reduce Your Cancer Risk" from the [employer].  She was also encouraged to engage in moderate to vigorous exercise for 30 minutes per day most days of the week. We discussed the [employer] fitness program, which is designed for cancer survivors to help them become more physically fit after cancer treatments.  She was instructed to limit her alcohol consumption and continue to abstain from tobacco use.     10. Support services/counseling: It is not uncommon for this period of the patient's cancer care trajectory to be one of many emotions and stressors.  We discussed how this can be increasingly difficult during the times of quarantine and social distancing due to the COVID-19 pandemic.   She was given information regarding our available services and encouraged to contact me with any questions or for help enrolling in any of our support group/programs.    Follow up instructions:    -Return to cancer center 10/2019 for f/u with Dr. Lindi Adie  -Mammogram due in 09/2019 -Follow up with surgery 04/2020 -She is welcome to return back to the Survivorship Clinic at any time; no additional follow-up needed at this time.  -Consider  referral back to survivorship as a long-term survivor for continued surveillance  The patient was provided an opportunity to ask questions and all were answered. The patient agreed with the plan and demonstrated an understanding of the instructions.   Total encounter time: 45 minutes*  Wilber Bihari, NP 07/28/19 6:45 AM Medical Oncology and Hematology [facility] [place], [place] [address] Tel. [phone]    Fax. [phone]  *Total Encounter Time as defined by the Centers for Medicare and Medicaid Services includes, in addition to the face-to-face time of a patient visit (documented in the note above) non-face-to-face time: obtaining and reviewing outside history, ordering and reviewing medications, tests or procedures, care coordination (communications with other health care professionals or caregivers) and documentation in the medical record.

## 2019-07-29 ENCOUNTER — Telehealth: Payer: Self-pay | Admitting: Hematology and Oncology

## 2019-07-29 ENCOUNTER — Encounter: Payer: Self-pay | Admitting: Licensed Clinical Social Worker

## 2019-07-29 NOTE — Telephone Encounter (Signed)
Scheduled appts per 5/5 los. Pt confirmed appt date and time.

## 2019-07-29 NOTE — Progress Notes (Signed)
[facility] Quality of Life Screening Clinical Social Work  Clinical Social Work was referred by survivorship quality of life screening protocol.  The patient scored a 35.45 on the Quality of Life/ Cancer Survivor (QOL-CS) scale which indicates moderate quality of life.   Clinical Social Worker contacted patient by phone to assess for needs. Based on screener and patient report, physical well being, relationships, and support are concerns. Patient and NP are working on the physical concerns with medication changes. Per patient, relationships and support were a significant issue when she was undergoing radiation. Since then, she has decided to dedicate less energy to people who are not there for her and focus on those she can rely on which has been helping her mentally. Previously, patient had many financial stressors, but these have improved as she began receiving her disability checks in March 2021.   Discussed common themes in survivorship including fear of recurrence and psychological distress. CSW provided supportive listening and reviewed services available, including support group, if patient is interested in the future.    Follow up plan: 1. Patient to continue medication changes as prescribed by medical team and continue to focus on the fulfilling relationships in her life 2. Follow-up QOL screen to be sent in 1 months.     Marshay Slates E, LCSW

## 2019-08-03 ENCOUNTER — Ambulatory Visit: Payer: Medicaid Other | Attending: Adult Health | Admitting: Physical Therapy

## 2019-08-03 ENCOUNTER — Encounter: Payer: Self-pay | Admitting: Physical Therapy

## 2019-08-03 ENCOUNTER — Other Ambulatory Visit: Payer: Self-pay

## 2019-08-03 DIAGNOSIS — I890 Lymphedema, not elsewhere classified: Secondary | ICD-10-CM | POA: Insufficient documentation

## 2019-08-03 DIAGNOSIS — M25611 Stiffness of right shoulder, not elsewhere classified: Secondary | ICD-10-CM | POA: Insufficient documentation

## 2019-08-03 DIAGNOSIS — L599 Disorder of the skin and subcutaneous tissue related to radiation, unspecified: Secondary | ICD-10-CM | POA: Insufficient documentation

## 2019-08-03 DIAGNOSIS — M25511 Pain in right shoulder: Secondary | ICD-10-CM | POA: Insufficient documentation

## 2019-08-03 NOTE — Therapy (Signed)
Carol Crawford, [place], [address] Phone: [phone]   Fax:  [phone]  Physical Therapy Evaluation  Patient Details  Name: Carol Crawford MRN: [id number] Date of Birth: [birth year] Referring Provider (PT): Carol Crawford    Encounter Date: 08/03/2019  PT End of Session - 08/03/19 1712    Visit Number  1    Number of Visits  12    Date for PT Re-Evaluation  10/04/19    PT Start Time  1500    PT Stop Time  1545    PT Time Calculation (min)  45 min    Activity Tolerance  Patient tolerated treatment well    Behavior During Therapy  Carol Crawford for tasks assessed/performed       Past Medical History:  Diagnosis Date  . ADHD   . Bipolar 1 disorder ([place])   . Depressed   . Drug overdose 2010  . Family history of breast cancer   . Family history of colon cancer   . Family history of prostate cancer   . GERD (gastroesophageal reflux disease)    OCC  . Headache    MIGRAINES  . History of MRSA infection    foot infection  . Migraine   . Schizophrenia ([place])   . Stroke [facility])    after foot surgery, left side was numb, fully recovered    Past Surgical History:  Procedure Laterality Date  . BREAST BIOPSY Right 10/21/2018   Affirm Bx-"X" clip-path pending  . BREAST LUMPECTOMY WITH RADIOACTIVE SEED AND SENTINEL LYMPH NODE BIOPSY Right 12/02/2018   Procedure: RIGHT BREAST LUMPECTOMY WITH RADIOACTIVE SEED AND SENTINEL LYMPH NODE BIOPSY;  Surgeon: Carol Kussmaul, MD;  Location: [place];  Service: General;  Laterality: Right;  . INCISION AND DRAINAGE Left 01/09/2018   Procedure: INCISION AND DRAINAGE-below fascia foot; multiple;  Surgeon: Carol Crawford, DPM;  Location: [facility];  Service: Podiatry;  Laterality: Left;  . INCISION AND DRAINAGE Left 04/17/2018   Procedure: I&D; BELOW FASCIA FOOT; MULTIPLE;  Surgeon: Carol Crawford, DPM;  Location: [facility];  Service: Podiatry;  Laterality: Left;  .  INCISION AND DRAINAGE Right 12/18/2018   Procedure: PLACEMENT OF DRAIN IN RIGHT AXILLA;  Surgeon: Carol Kussmaul, MD;  Location: [place];  Service: General;  Laterality: Right;  . REPAIR EXTENSOR TENDON Left 09/05/2017   Procedure: REPAIR EXTENSOR TENDON;  Surgeon: Carol Crawford, DPM;  Location: [facility];  Service: Podiatry;  Laterality: Left;    There were no vitals filed for this visit.   Subjective Crawford - 08/03/19 1457    Subjective  She has burning,  swelling, numbness  in her right breast  with sharp pains from nipple back to axilla and into upper arm    Pertinent History  right breast cancer:  11/10/2018: Stage IA (cT1c, cN0, cM0, G1, ER+, PR+, HER2-)Right lumpectomy Carol Crawford)  IDC with DCIS, grade 1, 1.4cm, 4 axillary lymph nodes negative, Pt reports she has a seroma in that was drained 4-5 times and had a drain in place for a while  Pt with radiation 01/28/2019 - 03/31/2019 60.4 gray with boost to seroma. Pt has past history with injury to her foot in 2017 involving a laceration to several tendons.  She has had 9 surgeries and feels that she still has problems with it.    Patient Stated Goals  to get progress in her arm    Currently in Pain?  No/denies    Pain Score  9     Pain Location  Breast    Pain Orientation  Right    Pain Descriptors / Indicators  Sharp   pinching   Pain Frequency  Constant         Carol Crawford - 08/03/19 0001      Crawford   Medical Diagnosis  right breast cancer     Referring Provider (PT)  Carol Crawford     Onset Date/Surgical Date  11/10/18    Hand Dominance  Right      Precautions   Precautions  Other (comment)    Precaution Comments  at risk for lymphedema       Restrictions   Other Position/Activity Restrictions  pt has difficutly bearing weight on her left leg       Balance Screen   Has the patient fallen in the past 6 months  Yes   due to foot pain, but will not be addressed this episode    How many times?  2    Has the patient  had a decrease in activity level because of a fear of falling?   Yes    Is the patient reluctant to leave their home because of a fear of falling?   Yes      [place]  Private residence    Living Arrangements  Alone    Available Help at Discharge  Family;Available 24 hours/day      Prior Function   Level of Independence  Independent with basic ADLs    Vocation  Unemployed    Leisure  pt is trying to exercise her right arm and walks only about 20 steps to her mailbox , had to sit and keep her left foot elevated       Cognition   Overall Cognitive Status  Within Functional Limits for tasks assessed      Observation/Other Assessments   Observations  Pt with visible swelling in right breast with pittind edema and  discolored skin with enlarged pores. She has a palpable cord in her right axillia and lateral anterior elbow that is visible     Skin Integrity  no open area on upper quadrant  but pt had bandaids on left foot     Other Surveys   Quick Dash    Quick DASH   88.64      Sensation   Additional Comments  numbness down right arm and breast is tender       Coordination   Gross Motor Movements are Fluid and Coordinated  No   guarded movement of right arm due to pain      Posture/Postural Control   Posture/Postural Control  Postural limitations    Postural Limitations  Increased lumbar lordosis      ROM / Strength   AROM / PROM / Strength  AROM;Strength      AROM   Right Shoulder Flexion  100 Degrees    Right Shoulder ABduction  90 Degrees    Left Shoulder Flexion  175 Degrees    Left Shoulder ABduction  180 Degrees      Strength   Overall Strength  Deficits    Overall Strength Comments  Right arm limited by pain in right breast and upper quadrant     Right/Left Shoulder  Right;Left    Right Shoulder Flexion  2+/5    Right Shoulder ABduction  2+/5    Left Shoulder Flexion  5/5    Left Shoulder  ABduction  5/5      Palpation   Palpation  comment  Pt with fullness and congestion  in right breast and lateral chest with guitar string cording in right lateral anterior eblbow.         LYMPHEDEMA/ONCOLOGY QUESTIONNAIRE - 08/03/19 1531      Type   Cancer Type  right breast cancer       Surgeries   Number Lymph Nodes Removed  4      Treatment   Active Chemotherapy Treatment  No    Past Chemotherapy Treatment  No    Active Radiation Treatment  No    Past Radiation Treatment  Yes    Date  03/31/19    Body Site  right upper quadrant     Current Hormone Treatment  Yes    Drug Name  tamoxifen       What other symptoms do you have   Are you Having Heaviness or Tightness  Yes    Are you having Pain  Yes    Are you having pitting edema  Yes    Body Site  right breast     Is it Hard or Difficult finding clothes that fit  Yes    Do you have infections  No    Is there Decreased scar mobility  Yes    [place]mer Sign  No      Lymphedema Stage   Stage  STAGE 2 SPONTANEOUSLY IRREVERSIBLE      Lymphedema Assessments   Lymphedema Assessments  Upper extremities      Right Upper Extremity Lymphedema   10 cm Proximal to Olecranon Process  28.5 cm    Olecranon Process  25.5 cm    15 cm Proximal to Ulnar Styloid Process  26.3 cm    10 cm Proximal to Ulnar Styloid Process  22.5 cm    Just Proximal to Ulnar Styloid Process  16.1 cm    Across Hand at [employer]  20 cm    At [place] of 2nd Digit  6 cm      Left Upper Extremity Lymphedema   10 cm Proximal to Olecranon Process  28 cm    Olecranon Process  25.5 cm    15 cm Proximal to Ulnar Styloid Process  25 cm    10 cm Proximal to Ulnar Styloid Process  22 cm    Just Proximal to Ulnar Styloid Process  16 cm    Across Hand at [employer]  19.5 cm    At [place] of 2nd Digit  5.5 cm          Quick Dash - 08/03/19 0001    Open a tight or new jar  Severe difficulty    Do heavy household chores (wash walls, wash floors)  Severe difficulty    Carry a shopping bag or briefcase   Unable    Wash your back  Unable    Use a knife to cut food  Severe difficulty    Recreational activities in which you take some force or impact through your arm, shoulder, or hand (golf, hammering, tennis)  Unable    During the past week, to what extent has your arm, shoulder or hand problem interfered with your normal social activities with family, friends, neighbors, or groups?  Extremely    During the past week, to what extent has your arm, shoulder or hand problem limited your work or other regular daily activities  Quite a bit  Arm, shoulder, or hand pain.  Severe    Tingling (pins and needles) in your arm, shoulder, or hand  Extreme    Difficulty Sleeping  So much difficuSo much difficulty, I can't sleep    DASH Score  88.64 %        Objective measurements completed on examination: See above findings.                   PT Long Term Goals - 08/03/19 1556      PT LONG TERM GOAL #1   Title  Pt will reducue the Quick DASH score to < 50 indicating an improvment in function of right shoulder    Baseline  08/03/2019: 88.64    Time  8    Period  Weeks    Status  New      PT LONG TERM GOAL #2   Title  Pt will report the pain in her right breast is decreased to 5/10    Baseline  9/10 on 08/03/2019    Time  8    Period  Weeks    Status  New      PT LONG TERM GOAL #3   Title  Pt will have  active right shoulder abudction of  140 degrees so that she can perform her ADLS better    Baseline  90 degrees on 08/03/2019    Time  8    Period  Weeks    Status  New      PT LONG TERM GOAL #4   Title  Pt will know how to manage her right breast lymphedema with self MLD and compression    Baseline  no knowledge on 5/11/ 2021    Time  8    Period  Weeks    Status  New      PT LONG TERM GOAL #5   Title  Pt will be independent in a home exercise program for right shoulder ROM and stregth    Baseline  no knowledge    Time  8    Period  Weeks    Status  New              Plan - 08/03/19 1712    Clinical Impression Statement  Pt comes to PT with pain and swelling in right upper quadrant with decreased active shoulder motion and functional activities after surgery and radiation for treatment of right breast cancer    Personal Factors and Comorbidities  Comorbidity 3+    Comorbidities  surgery, 4 nodes removed, seroma with drainage, radiation    Examination-Activity Limitations  Reach Overhead;Carry;Dressing;Hygiene/Grooming    Stability/Clinical Decision Making  Stable/Uncomplicated    Clinical Decision Making  Low    Rehab Potential  Good    PT Frequency  1x / week   then 2x a week for 4 weeks after Medicaid reauthorization   PT Duration  3 weeks    PT Treatment/Interventions  ADLs/Self Care Home Management;Moist Heat;DME Instruction;Therapeutic activities;Therapeutic exercise;Patient/family education;Manual techniques;Manual lymph drainage;Orthotic Fit/Training;Passive range of motion;Scar mobilization;Compression bandaging;Taping    PT Next Visit Plan  Begin performing and teaching self MLD, ROM exercise to shoulder Pt to be fitted for compression bra and sleeve at 9:30 on May 21    Consulted and Agree with Plan of Care  Patient       Patient will benefit from skilled therapeutic intervention in order to improve the following deficits and impairments:  Decreased activity tolerance, Decreased knowledge of use  of DME, Decreased strength, Increased fascial restricitons, Impaired UE functional use, Pain, Increased edema, Decreased scar mobility, Decreased range of motion, Decreased knowledge of precautions, Decreased skin integrity  Visit Diagnosis: Lymphedema, not elsewhere classified  Acute pain of right shoulder  Stiffness of right shoulder, not elsewhere classified  Disorder of the skin and subcutaneous tissue related to radiation, unspecified     Problem List Patient Active Problem List   Diagnosis Date Noted  . Genetic testing  11/26/2018  . Family history of breast cancer   . Family history of prostate cancer   . Family history of colon cancer   . Malignant neoplasm of upper-outer quadrant of right breast in female, estrogen receptor positive ([place]) 11/10/2018  . Acute ischemic stroke ([place])   . Left sided numbness 01/09/2018  . Depressed   . Bipolar 1 disorder ([place])    Donato Heinz. Owens Shark PT  Norwood Levo 08/03/2019, 5:25 PM  [place], [place], [address] Phone: [phone]   Fax:  [phone]  Name: Carol Crawford MRN: [id number] Date of Birth: [birth year]

## 2019-08-12 ENCOUNTER — Encounter: Payer: Self-pay | Admitting: *Deleted

## 2019-08-12 NOTE — Progress Notes (Signed)
Successfully faxed office notes to [facility] for approval of compression garments.

## 2019-08-13 ENCOUNTER — Ambulatory Visit: Payer: Medicaid Other | Admitting: Rehabilitation

## 2019-08-13 ENCOUNTER — Encounter: Payer: Self-pay | Admitting: Rehabilitation

## 2019-08-13 ENCOUNTER — Other Ambulatory Visit: Payer: Self-pay

## 2019-08-13 DIAGNOSIS — M25511 Pain in right shoulder: Secondary | ICD-10-CM

## 2019-08-13 DIAGNOSIS — M25611 Stiffness of right shoulder, not elsewhere classified: Secondary | ICD-10-CM

## 2019-08-13 DIAGNOSIS — L599 Disorder of the skin and subcutaneous tissue related to radiation, unspecified: Secondary | ICD-10-CM

## 2019-08-13 DIAGNOSIS — I890 Lymphedema, not elsewhere classified: Secondary | ICD-10-CM

## 2019-08-13 NOTE — Patient Instructions (Addendum)
Self manual lymph drainage: Perform this sequence once a day.  Only give enough pressure no your skin to make the skin move.  Diaphragmatic - Supine   Inhale through nose making navel move out toward hands. Exhale through puckered lips, hands follow navel in. Repeat _5__ times. Rest _10__ seconds between repeats.   Copyright  VHI. All rights reserved.  Hug yourself.  Do circles at your neck just above your collarbones.  Repeat this 10 times.  Axilla - One at a Time   Using full weight of flat hand and fingers at center of uninvolved armpit, make _10__ in-place circles.   Copyright  VHI. All rights reserved.  LEG: Inguinal Nodes Stimulation   With small finger side of hand against hip crease on involved side, gently perform circles at the crease. Repeat __10_ times.   Copyright  VHI. All rights reserved.  1) Axilla to Inguinal Nodes - Sweep   On involved side, sweep _4__ times from armpit along side of trunk to hip crease. 2)   Now gently stretch skin from the involved side to the uninvolved side across the chest at the shoulder line.  Repeat that 4 times.   Draw an imaginary diagonal line from upper outer breast through the nipple area toward lower inner breast.  Direct fluid upward and inward from this line toward the pathway across your upper chest .  Do this in three rows to treat all of the upper inner breast tissue, and do each row 3-4x.      Direct fluid to treat all of lower outer breast tissue downward and outward toward      pathway that is aimed at the left groin.                            Finish by doing the pathways as described above going from your involved armpit to the same side groin and going across your upper chest from the involved shoulder to the uninvolved shoulder.   Copyright  VHI. All rights reserved.  Arm Posterior: Elbow to Shoulder - Sweep   Pump _5__ times from back of elbow to top of shoulder. Then inner to outer  upper arm _5_ times, then outer arm again _5_ times. Then back to the pathways _2-3_ times. Do _1__ time per day.  Copyright  VHI. All rights reserved.  ARM: [place] Wrist to Elbow - Sweep   Pump or stationary circles _5__ times from wrist to elbow making sure to do both sides of the forearm. Then retrace your steps to the outer arm, and the pathways _2-3_ times each. Do _1__ time per day.  Copyright  VHI. All rights reserved.  ARM: Dorsum of Hand to Shoulder - Sweep   Pump or stationary circles _5__ times on back of hand including knuckle spaces and individual fingers if needed working up towards the wrist, then retrace all your steps working back up the forearm, doing both sides; upper outer arm and back to your pathways _2-3_ times each. Then do 5 circles again at uninvolved armpit and involved groin where you started! Good job!! Do __1_ time per day.  Copyright  VHI. All rights reserved.

## 2019-08-13 NOTE — Therapy (Signed)
[place], [place], [address] Phone: [phone]   Fax:  [phone]  Physical Therapy Treatment  Patient Details  Name: Carol Crawford MRN: [id number] Date of Birth: [birth year] Referring Provider (PT): Narda Amber    Encounter Date: 08/13/2019  PT End of Session - 08/13/19 1100    Authorization - Visit Number  1    Authorization - Number of Visits  3       Past Medical History:  Diagnosis Date  . ADHD   . Bipolar 1 disorder ([place])   . Depressed   . Drug overdose 2010  . Family history of breast cancer   . Family history of colon cancer   . Family history of prostate cancer   . GERD (gastroesophageal reflux disease)    OCC  . Headache    MIGRAINES  . History of MRSA infection    foot infection  . Migraine   . Schizophrenia ([place])   . Stroke [facility])    after foot surgery, left side was numb, fully recovered    Past Surgical History:  Procedure Laterality Date  . BREAST BIOPSY Right 10/21/2018   Affirm Bx-"X" clip-path pending  . BREAST LUMPECTOMY WITH RADIOACTIVE SEED AND SENTINEL LYMPH NODE BIOPSY Right 12/02/2018   Procedure: RIGHT BREAST LUMPECTOMY WITH RADIOACTIVE SEED AND SENTINEL LYMPH NODE BIOPSY;  Surgeon: Jovita Kussmaul, MD;  Location: [place];  Service: General;  Laterality: Right;  . INCISION AND DRAINAGE Left 01/09/2018   Procedure: INCISION AND DRAINAGE-below fascia foot; multiple;  Surgeon: [name], DPM;  Location: [facility];  Service: Podiatry;  Laterality: Left;  . INCISION AND DRAINAGE Left 04/17/2018   Procedure: I&D; BELOW FASCIA FOOT; MULTIPLE;  Surgeon: [name], DPM;  Location: [facility];  Service: Podiatry;  Laterality: Left;  . INCISION AND DRAINAGE Right 12/18/2018   Procedure: PLACEMENT OF DRAIN IN RIGHT AXILLA;  Surgeon: Jovita Kussmaul, MD;  Location: [place];  Service: General;  Laterality: Right;  . REPAIR EXTENSOR TENDON Left 09/05/2017    Procedure: REPAIR EXTENSOR TENDON;  Surgeon: [name], DPM;  Location: [facility];  Service: Podiatry;  Laterality: Left;    There were no vitals filed for this visit.  Subjective Assessment - 08/13/19 1004    Subjective  A little sore.  I got measured for a bra, sleeve for day and night.    Pertinent History  right breast cancer:  11/10/2018: Stage IA (cT1c, cN0, cM0, G1, ER+, PR+, HER2-)Right lumpectomy [name])  IDC with DCIS, grade 1, 1.4cm, 4 axillary lymph nodes negative, Pt reports she has a seroma in that was drained 4-5 times and had a drain in place for a while  Pt with radiation 01/28/2019 - 03/31/2019 60.4 gray with boost to seroma. Pt has past history with injury to her foot in 2017 involving a laceration to several tendons.  She has had 9 surgeries and feels that she still has problems with it.    Currently in Pain?  Yes    Pain Score  9     Pain Location  Breast    Pain Orientation  Right    Pain Descriptors / Indicators  Sharp;Aching    Pain Type  Surgical pain    Pain Radiating Towards  the Rt whole arm    Pain Onset  More than a month ago    Pain Frequency  Constant    Aggravating Factors   movement    Pain  Relieving Factors  pressure                        [facility] Adult PT Treatment/Exercise - 08/13/19 0001      Manual Therapy   Manual Therapy  Manual Lymphatic Drainage (MLD);Edema management    Manual therapy comments  pt consent for breast massage    Edema Management  made 1/2" gray foam rectangle for use in the lateral bra.  Pt not with the best bra but should get a compression bra soon as she was measured today.  Also gave pt postop small pillow for the axilla    Manual Lymphatic Drainage (MLD)  Focus on self MLD education for the Rt breast and UE.  PT performed steps supine with hand over hand instruction for the patient after each step.  Min/Mod cues needed but overall good technique by patient.  5 deep breaths, SCF bil, bil axillary nodes, Rt  inguinal nodes, anterior interaxillary anastamosis, Rt axillo inguinal anastamosis, Then Rt breast directing superior portion in 3 rows towards the neck and the inferior and lateral breast directing towards the Rt  groin.  Then PT performed in sidelying more posterior interaxillary work and gentle axillary and lateral trunk axilloinguinal work.  Then in seated educated pt on the UE portions from shoulder to hand and then reversing.               PT Education - 08/13/19 1057    Education Details  self MLD    Person(s) Educated  Patient    Methods  Explanation;Demonstration;Tactile cues;Verbal cues;Handout    Comprehension  Verbalized understanding;Returned demonstration;Verbal cues required;Tactile cues required;Need further instruction          PT Long Term Goals - 08/03/19 1556      PT LONG TERM GOAL #1   Title  Pt will reducue the Quick DASH score to < 50 indicating an improvment in function of right shoulder    Baseline  08/03/2019: 88.64    Time  8    Period  Weeks    Status  New      PT LONG TERM GOAL #2   Title  Pt will report the pain in her right breast is decreased to 5/10    Baseline  9/10 on 08/03/2019    Time  8    Period  Weeks    Status  New      PT LONG TERM GOAL #3   Title  Pt will have  active right shoulder abudction of  140 degrees so that she can perform her ADLS better    Baseline  90 degrees on 08/03/2019    Time  8    Period  Weeks    Status  New      PT LONG TERM GOAL #4   Title  Pt will know how to manage her right breast lymphedema with self MLD and compression    Baseline  no knowledge on 5/11/ 2021    Time  8    Period  Weeks    Status  New      PT LONG TERM GOAL #5   Title  Pt will be independent in a home exercise program for right shoulder ROM and stregth    Baseline  no knowledge    Time  8    Period  Weeks    Status  New            Plan -  08/13/19 1058    Clinical Impression Statement  First session of Rt breast MLD and  Rt UE MLD with focus on self MLD education.  General edema noted whole Rt breast, SCF, axilla and lateral trunk with possible return of seroma or seroma still present.  Pt very painful and tender to light touch in the axilla but tolerated treatment well    PT Frequency  1x / week    PT Duration  3 weeks    PT Treatment/Interventions  ADLs/Self Care Home Management;Moist Heat;DME Instruction;Therapeutic activities;Therapeutic exercise;Patient/family education;Manual techniques;Manual lymph drainage;Orthotic Fit/Training;Passive range of motion;Scar mobilization;Compression bandaging;Taping    PT Next Visit Plan  how was foam, get bra?, how was MLD? cont performing and teaching self MLD for the Rt UE and breast, ROM exercise to shoulder    PT Home Exercise Plan  self MLD    Consulted and Agree with Plan of Care  Patient       Patient will benefit from skilled therapeutic intervention in order to improve the following deficits and impairments:     Visit Diagnosis: Lymphedema, not elsewhere classified  Acute pain of right shoulder  Stiffness of right shoulder, not elsewhere classified  Disorder of the skin and subcutaneous tissue related to radiation, unspecified     Problem List Patient Active Problem List   Diagnosis Date Noted  . Genetic testing 11/26/2018  . Family history of breast cancer   . Family history of prostate cancer   . Family history of colon cancer   . Malignant neoplasm of upper-outer quadrant of right breast in female, estrogen receptor positive ([place]) 11/10/2018  . Acute ischemic stroke ([place])   . Left sided numbness 01/09/2018  . Depressed   . Bipolar 1 disorder ([place])     Stark Bray 08/13/2019, 11:01 AM  [place], [place], [address] Phone: [phone]   Fax:  [phone]  Name: Carol Crawford MRN: [id number] Date of Birth: Dec 10, 1975

## 2019-08-17 ENCOUNTER — Other Ambulatory Visit: Payer: Self-pay

## 2019-08-17 ENCOUNTER — Encounter: Payer: Self-pay | Admitting: Physical Therapy

## 2019-08-17 ENCOUNTER — Ambulatory Visit: Payer: Medicaid Other | Admitting: Physical Therapy

## 2019-08-17 DIAGNOSIS — I890 Lymphedema, not elsewhere classified: Secondary | ICD-10-CM

## 2019-08-17 DIAGNOSIS — L599 Disorder of the skin and subcutaneous tissue related to radiation, unspecified: Secondary | ICD-10-CM

## 2019-08-17 DIAGNOSIS — M25511 Pain in right shoulder: Secondary | ICD-10-CM

## 2019-08-17 DIAGNOSIS — M25611 Stiffness of right shoulder, not elsewhere classified: Secondary | ICD-10-CM

## 2019-08-17 NOTE — Patient Instructions (Signed)
[email or web address] Resources Self care videos Self mld for upper extremitties Introduction, then right upper extremity

## 2019-08-17 NOTE — Therapy (Signed)
[place], [place], [address] Phone: [phone]   Fax:  [phone]  Physical Therapy Treatment  Patient Details  Name: Carol Crawford MRN: [id number] Date of Birth: 1975/09/30 Referring Provider (PT): Narda Amber    Encounter Date: 08/17/2019  PT End of Session - 08/17/19 1722    Visit Number  3    Number of Visits  12    Date for PT Re-Evaluation  10/04/19    PT Start Time  1500    PT Stop Time  1545    PT Time Calculation (min)  45 min    Activity Tolerance  Patient tolerated treatment well    Behavior During Therapy  [facility] for tasks assessed/performed       Past Medical History:  Diagnosis Date  . ADHD   . Bipolar 1 disorder ([place])   . Depressed   . Drug overdose 2010  . Family history of breast cancer   . Family history of colon cancer   . Family history of prostate cancer   . GERD (gastroesophageal reflux disease)    OCC  . Headache    MIGRAINES  . History of MRSA infection    foot infection  . Migraine   . Schizophrenia ([place])   . Stroke [facility])    after foot surgery, left side was numb, fully recovered    Past Surgical History:  Procedure Laterality Date  . BREAST BIOPSY Right 10/21/2018   Affirm Bx-"X" clip-path pending  . BREAST LUMPECTOMY WITH RADIOACTIVE SEED AND SENTINEL LYMPH NODE BIOPSY Right 12/02/2018   Procedure: RIGHT BREAST LUMPECTOMY WITH RADIOACTIVE SEED AND SENTINEL LYMPH NODE BIOPSY;  Surgeon: Jovita Kussmaul, MD;  Location: [place];  Service: General;  Laterality: Right;  . INCISION AND DRAINAGE Left 01/09/2018   Procedure: INCISION AND DRAINAGE-below fascia foot; multiple;  Surgeon: [name], DPM;  Location: [facility];  Service: Podiatry;  Laterality: Left;  . INCISION AND DRAINAGE Left 04/17/2018   Procedure: I&D; BELOW FASCIA FOOT; MULTIPLE;  Surgeon: [name], DPM;  Location: [facility];  Service: Podiatry;  Laterality: Left;  .  INCISION AND DRAINAGE Right 12/18/2018   Procedure: PLACEMENT OF DRAIN IN RIGHT AXILLA;  Surgeon: Jovita Kussmaul, MD;  Location: [place];  Service: General;  Laterality: Right;  . REPAIR EXTENSOR TENDON Left 09/05/2017   Procedure: REPAIR EXTENSOR TENDON;  Surgeon: [name], DPM;  Location: [facility];  Service: Podiatry;  Laterality: Left;    There were no vitals filed for this visit.  Subjective Assessment - 08/17/19 1714    Subjective  Pt reports she is doing her self MLD and trying to do the dowel exercises, but she is having a lot of pain in her shoulder    Pertinent History  right breast cancer:  11/10/2018: Stage IA (cT1c, cN0, cM0, G1, ER+, PR+, HER2-)Right lumpectomy Carol Crawford)  IDC with DCIS, grade 1, 1.4cm, 4 axillary lymph nodes negative, Pt reports she has a seroma in that was drained 4-5 times and had a drain in place for a while  Pt with radiation 01/28/2019 - 03/31/2019 60.4 gray with boost to seroma. Pt has past history with injury to her foot in 2017 involving a laceration to several tendons.  She has had 9 surgeries and feels that she still has problems with it.    Patient Stated Goals  to get progress in her arm    Currently in Pain?  Yes    Pain  Score  9    and a half   Pain Location  Shoulder    Pain Orientation  Right    Pain Descriptors / Indicators  Aching    Pain Type  Acute pain    Pain Radiating Towards  whole right arm    Pain Onset  More than a month ago   pt states she shoulder feels worse   Pain Frequency  Constant    Aggravating Factors   movement of shoulder    Pain Relieving Factors  no moving arm                        [facility] Adult PT Treatment/Exercise - 08/17/19 0001      Exercises   Exercises  Shoulder      Shoulder Exercises: Supine   Protraction  AROM;Right;5 reps    Flexion  AAROM;Right;10 reps    Flexion Limitations  limited by pain     Other Supine Exercises  small circles with hand pointed to celiling       Shoulder Exercises:  Sidelying   ABduction  AROM;Right;5 reps    ABduction Limitations  limited ROM    Other Sidelying Exercises  small circles with hand to ceiling       Manual Therapy   Manual Therapy  Edema management;Joint mobilization;Manual Lymphatic Drainage (MLD);Passive ROM    Manual therapy comments  Pt given link for klosetraining link for self MLD     Edema Management  provided medium tg soft for right arm     Joint Mobilization  gentle glide to [place] , AC and GH joint     Manual Lymphatic Drainage (MLD)  in supine, short neck, superficial and deep abdominals, right inguinal nodes and right axillo-inguinal anastamosis, left axillary nodes and anterior interaxiallry anastamosis, right breast, shoulder arm and hand with return along pathways, then to sidelying for back and posterior interaxillary anastamosis     Passive ROM  to right shoulder as tolerated with elbow bent              PT Education - 08/17/19 1722    Education Details  self MLD    Person(s) Educated  Patient    Methods  Handout;Explanation;Demonstration    Comprehension  Verbalized understanding;Returned demonstration          PT Long Term Goals - 08/03/19 1556      PT LONG TERM GOAL #1   Title  Pt will reducue the Quick DASH score to < 50 indicating an improvment in function of right shoulder    Baseline  08/03/2019: 88.64    Time  8    Period  Weeks    Status  New      PT LONG TERM GOAL #2   Title  Pt will report the pain in her right breast is decreased to 5/10    Baseline  9/10 on 08/03/2019    Time  8    Period  Weeks    Status  New      PT LONG TERM GOAL #3   Title  Pt will have  active right shoulder abudction of  140 degrees so that she can perform her ADLS better    Baseline  90 degrees on 08/03/2019    Time  8    Period  Weeks    Status  New      PT LONG TERM GOAL #4   Title  Pt will know how  to manage her right breast lymphedema with self MLD and compression    Baseline  no knowledge on 5/11/ 2021     Time  8    Period  Weeks    Status  New      PT LONG TERM GOAL #5   Title  Pt will be independent in a home exercise program for right shoulder ROM and stregth    Baseline  no knowledge    Time  8    Period  Weeks    Status  New            Plan - 08/17/19 1723    Clinical Impression Statement  Pt very limited by shoulder pain with decreased movment today. She reports she is doing well with self MLD and gets some relief from compression patch. Added tg soft for arm today and pt said it helped her    Personal Factors and Comorbidities  Comorbidity 3+    Comorbidities  surgery, 4 nodes removed, seroma with drainage, radiation    Examination-Activity Limitations  Reach Overhead;Carry;Dressing;Hygiene/Grooming    Stability/Clinical Decision Making  Stable/Uncomplicated    Clinical Decision Making  Low    Rehab Potential  Good    PT Frequency  1x / week    PT Duration  3 weeks    PT Treatment/Interventions  ADLs/Self Care Home Management;Moist Heat;DME Instruction;Therapeutic activities;Therapeutic exercise;Patient/family education;Manual techniques;Manual lymph drainage;Orthotic Fit/Training;Passive range of motion;Scar mobilization;Compression bandaging;Taping    PT Next Visit Plan  check goals and sent to Medicaid for more visits.  How was tg soft for arm. focus on shoulder movment and see if she can tolerate pulleys  cont performing and teaching self MLD for the Rt UE and breast, ROM exercise to shoulder    PT Home Exercise Plan  self MLD    Consulted and Agree with Plan of Care  Patient       Patient will benefit from skilled therapeutic intervention in order to improve the following deficits and impairments:     Visit Diagnosis: Lymphedema, not elsewhere classified  Acute pain of right shoulder  Stiffness of right shoulder, not elsewhere classified  Disorder of the skin and subcutaneous tissue related to radiation, unspecified     Problem List Patient Active Problem  List   Diagnosis Date Noted  . Genetic testing 11/26/2018  . Family history of breast cancer   . Family history of prostate cancer   . Family history of colon cancer   . Malignant neoplasm of upper-outer quadrant of right breast in female, estrogen receptor positive ([place]) 11/10/2018  . Acute ischemic stroke ([place])   . Left sided numbness 01/09/2018  . Depressed   . Bipolar 1 disorder ([place])    Donato Heinz. Owens Shark PT  Norwood Levo 08/17/2019, 5:26 PM  [place], [place], [address] Phone: [phone]   Fax:  [phone]  Name: CARROLL RANNEY MRN: [id number] Date of Birth: 02/27/1976

## 2019-08-30 ENCOUNTER — Encounter: Payer: Self-pay | Admitting: Rehabilitation

## 2019-08-30 ENCOUNTER — Ambulatory Visit: Payer: Medicaid Other | Attending: Adult Health | Admitting: Rehabilitation

## 2019-08-30 ENCOUNTER — Other Ambulatory Visit: Payer: Self-pay

## 2019-08-30 ENCOUNTER — Telehealth: Payer: Self-pay | Admitting: *Deleted

## 2019-08-30 DIAGNOSIS — M25511 Pain in right shoulder: Secondary | ICD-10-CM

## 2019-08-30 DIAGNOSIS — M25611 Stiffness of right shoulder, not elsewhere classified: Secondary | ICD-10-CM | POA: Insufficient documentation

## 2019-08-30 DIAGNOSIS — L599 Disorder of the skin and subcutaneous tissue related to radiation, unspecified: Secondary | ICD-10-CM | POA: Insufficient documentation

## 2019-08-30 DIAGNOSIS — I890 Lymphedema, not elsewhere classified: Secondary | ICD-10-CM

## 2019-08-30 NOTE — Therapy (Signed)
[place], [place], [address] Phone: [phone]   Fax:  [phone]  Physical Therapy Treatment  Patient Details  Name: Carol Crawford MRN: [id number] Date of Birth: Mar 27, 1975 Referring Provider (PT): Narda Amber    Encounter Date: 08/30/2019  PT End of Session - 08/30/19 1637    Visit Number  4    Number of Visits  12    Date for PT Re-Evaluation  10/04/19    PT Start Time  1608    PT Stop Time  1630    PT Time Calculation (min)  22 min    Activity Tolerance  Patient limited by pain;Treatment limited secondary to medical complications (Comment)    Behavior During Therapy  [facility] for tasks assessed/performed       Past Medical History:  Diagnosis Date   ADHD    Bipolar 1 disorder ([place])    Depressed    Drug overdose 2010   Family history of breast cancer    Family history of colon cancer    Family history of prostate cancer    GERD (gastroesophageal reflux disease)    OCC   Headache    MIGRAINES   History of MRSA infection    foot infection   Migraine    Schizophrenia ([place])    Stroke ([place])    after foot surgery, left side was numb, fully recovered    Past Surgical History:  Procedure Laterality Date   BREAST BIOPSY Right 10/21/2018   Affirm Bx-"X" clip-path pending   BREAST LUMPECTOMY WITH RADIOACTIVE SEED AND SENTINEL LYMPH NODE BIOPSY Right 12/02/2018   Procedure: RIGHT BREAST LUMPECTOMY WITH RADIOACTIVE SEED AND SENTINEL LYMPH NODE BIOPSY;  Surgeon: Jovita Kussmaul, MD;  Location: [place];  Service: General;  Laterality: Right;   INCISION AND DRAINAGE Left 01/09/2018   Procedure: INCISION AND DRAINAGE-below fascia foot; multiple;  Surgeon: [name], DPM;  Location: [facility];  Service: Podiatry;  Laterality: Left;   INCISION AND DRAINAGE Left 04/17/2018   Procedure: I&D; BELOW FASCIA FOOT; MULTIPLE;  Surgeon: [name], DPM;  Location: [facility];  Service: Podiatry;  Laterality: Left;   INCISION AND DRAINAGE Right 12/18/2018   Procedure: PLACEMENT OF DRAIN IN RIGHT AXILLA;  Surgeon: Jovita Kussmaul, MD;  Location: [place];  Service: General;  Laterality: Right;   REPAIR EXTENSOR TENDON Left 09/05/2017   Procedure: REPAIR EXTENSOR TENDON;  Surgeon: [name], DPM;  Location: [facility];  Service: Podiatry;  Laterality: Left;    There were no vitals filed for this visit.  Subjective Assessment - 08/30/19 1610    Subjective  Normally it feels better but now over the weekend I have a hard spot and now it seems pretty tender and swollen    Pertinent History  right breast cancer:  11/10/2018: Stage IA (cT1c, cN0, cM0, G1, ER+, PR+, HER2-)Right lumpectomy [name])  IDC with DCIS, grade 1, 1.4cm, 4 axillary lymph nodes negative, Pt reports she has a seroma in that was drained 4-5 times and had a drain in place for a while  Pt with radiation 01/28/2019 - 03/31/2019 60.4 gray with boost to seroma. Pt has past history with injury to her foot in 2017 involving a laceration to several tendons.  She has had 9 surgeries and feels that she still has problems with it.    Currently in Pain?  Yes    Pain Score  8     Pain Location  Breast  Pain Orientation  Right    Pain Descriptors / Indicators  Aching    Pain Type  Acute pain    Pain Onset  In the past 7 days    Pain Frequency  Constant    Aggravating Factors   wearing bra    Pain Relieving Factors  no bra, less movement           [facility] PT Assessment - 08/30/19 0001      Observation/Other Assessments   Observations  Assessed right breast with mild redness inferior breast lessening after some rest on the table, mild heat compared to the right side, and one small tender spot at the inferior breast that appears like a whitehead.  Due to her history of seroma and infection picture and note sent to MD today.                                   PT Long Term Goals -  08/30/19 1641      PT LONG TERM GOAL #1   Title  Pt will reducue the Quick DASH score to < 50 indicating an improvment in function of right shoulder    Baseline  08/03/2019: 88.64    Time  8    Period  Weeks    Status  Not Met      PT LONG TERM GOAL #2   Title  Pt will report the pain in her right breast is decreased to 5/10    Baseline  9/10 on 08/03/2019, 8/10 on 08/30/19    Time  8    Period  Weeks    Status  Not Met      PT LONG TERM GOAL #3   Title  Pt will have  active right shoulder abudction of  140 degrees so that she can perform her ADLS better    Status  Partially Met      PT LONG TERM GOAL #4   Title  Pt will know how to manage her right breast lymphedema with self MLD and compression    Status  Partially Met      PT LONG TERM GOAL #5   Title  Pt will be independent in a home exercise program for right shoulder ROM and stregth    Status  On-going            Plan - 08/30/19 1640    Clinical Impression Statement  Pt arrives wanting [place] to look at a new painful spot under the Rt breast which appears most likely a whitehead but pt does have some mild redness and increased pain to where a bra is now uncomfortable so note was sent to MD for advice.  Pt declined further treatment today reporting she just did not feel like doing anything else.    PT Frequency  2x / week    PT Duration  3 weeks    PT Treatment/Interventions  ADLs/Self Care Home Management;Moist Heat;DME Instruction;Therapeutic activities;Therapeutic exercise;Patient/family education;Manual techniques;Manual lymph drainage;Orthotic Fit/Training;Passive range of motion;Scar mobilization;Compression bandaging;Taping    PT Next Visit Plan  How is the breast pain and the new spot? focus on shoulder movment and see if she can tolerate pulleys  cont performing and teaching self MLD for the Rt UE and breast, ROM exercise to shoulder    PT Home Exercise Plan  self MLD    Consulted and Agree with Plan of Care  Patient  Patient will benefit from skilled therapeutic intervention in order to improve the following deficits and impairments:  Decreased activity tolerance, Decreased knowledge of use of DME, Decreased strength, Increased fascial restricitons, Impaired UE functional use, Pain, Increased edema, Decreased scar mobility, Decreased range of motion, Decreased knowledge of precautions, Decreased skin integrity  Visit Diagnosis: Lymphedema, not elsewhere classified  Acute pain of right shoulder  Stiffness of right shoulder, not elsewhere classified  Disorder of the skin and subcutaneous tissue related to radiation, unspecified     Problem List Patient Active Problem List   Diagnosis Date Noted   Genetic testing 11/26/2018   Family history of breast cancer    Family history of prostate cancer    Family history of colon cancer    Malignant neoplasm of upper-outer quadrant of right breast in female, estrogen receptor positive ([place]) 11/10/2018   Acute ischemic stroke ([place])    Left sided numbness 01/09/2018   Depressed    Bipolar 1 disorder ([place])     Stark Bray 08/30/2019, 4:43 PM  [place], [place], [address] Phone: [phone]   Fax:  [phone]  Name: LAKISA LOTZ MRN: [id number] Date of Birth: [birth year]

## 2019-08-30 NOTE — Telephone Encounter (Signed)
Received inbasket for PT that noted pt has on left breast an appearance of a "whitehead" with increased breast pain and mild redness and warmth.  Dr. Lindi Adie request pt to be seen in [facility]. Msg sent to [facility] regarding pt symptoms. Pt will be seen in [facility] on 6/8. Urgent inbasket sent.

## 2019-08-31 ENCOUNTER — Ambulatory Visit (HOSPITAL_BASED_OUTPATIENT_CLINIC_OR_DEPARTMENT_OTHER): Payer: Medicaid Other | Admitting: Medical

## 2019-08-31 ENCOUNTER — Telehealth: Payer: Self-pay | Admitting: Medical

## 2019-08-31 DIAGNOSIS — C50411 Malignant neoplasm of upper-outer quadrant of right female breast: Secondary | ICD-10-CM

## 2019-08-31 DIAGNOSIS — Z170 Estrogen receptor positive status [ER+]: Secondary | ICD-10-CM

## 2019-08-31 NOTE — Telephone Encounter (Signed)
Scheduled appt per 6/7 sch message - unable to reach pt .left message with appt date and time   

## 2019-09-02 NOTE — Progress Notes (Signed)
No show

## 2019-09-13 ENCOUNTER — Other Ambulatory Visit: Payer: Self-pay

## 2019-09-13 ENCOUNTER — Encounter: Payer: Self-pay | Admitting: Rehabilitation

## 2019-09-13 ENCOUNTER — Ambulatory Visit: Payer: Medicaid Other | Admitting: Rehabilitation

## 2019-09-13 DIAGNOSIS — M25611 Stiffness of right shoulder, not elsewhere classified: Secondary | ICD-10-CM | POA: Diagnosis present

## 2019-09-13 DIAGNOSIS — L599 Disorder of the skin and subcutaneous tissue related to radiation, unspecified: Secondary | ICD-10-CM | POA: Diagnosis present

## 2019-09-13 DIAGNOSIS — I890 Lymphedema, not elsewhere classified: Secondary | ICD-10-CM | POA: Diagnosis present

## 2019-09-13 DIAGNOSIS — M25511 Pain in right shoulder: Secondary | ICD-10-CM

## 2019-09-13 NOTE — Therapy (Signed)
[place], [place], [address] Phone: [phone]   Fax:  [phone]  Physical Therapy Treatment  Patient Details  Name: Carol Crawford MRN: [id number] Date of Birth: 1976/02/06 Referring Provider (PT): Carol Crawford    Encounter Date: 09/13/2019   PT End of Session - 09/13/19 1355    Visit Number 5    Number of Visits 12    Date for PT Re-Evaluation 10/04/19    Authorization - Visit Number 1    Authorization - Number of Visits 8    PT Start Time 6948    PT Stop Time 1353    PT Time Calculation (min) 51 min    Activity Tolerance Patient tolerated treatment well    Behavior During Therapy [facility] for tasks assessed/performed           Past Medical History:  Diagnosis Date  . ADHD   . Bipolar 1 disorder ([place])   . Depressed   . Drug overdose 2010  . Family history of breast cancer   . Family history of colon cancer   . Family history of prostate cancer   . GERD (gastroesophageal reflux disease)    OCC  . Headache    MIGRAINES  . History of MRSA infection    foot infection  . Migraine   . Schizophrenia ([place])   . Stroke [facility])    after foot surgery, left side was numb, fully recovered    Past Surgical History:  Procedure Laterality Date  . BREAST BIOPSY Right 10/21/2018   Affirm Bx-"X" clip-path pending  . BREAST LUMPECTOMY WITH RADIOACTIVE SEED AND SENTINEL LYMPH NODE BIOPSY Right 12/02/2018   Procedure: RIGHT BREAST LUMPECTOMY WITH RADIOACTIVE SEED AND SENTINEL LYMPH NODE BIOPSY;  Surgeon: Carol Kussmaul, MD;  Location: [place];  Service: General;  Laterality: Right;  . INCISION AND DRAINAGE Left 01/09/2018   Procedure: INCISION AND DRAINAGE-below fascia foot; multiple;  Surgeon: Carol Crawford, DPM;  Location: [facility];  Service: Podiatry;  Laterality: Left;  . INCISION AND DRAINAGE Left 04/17/2018   Procedure: I&D; BELOW FASCIA FOOT; MULTIPLE;  Surgeon: Carol Crawford,  DPM;  Location: [facility];  Service: Podiatry;  Laterality: Left;  . INCISION AND DRAINAGE Right 12/18/2018   Procedure: PLACEMENT OF DRAIN IN RIGHT AXILLA;  Surgeon: Carol Kussmaul, MD;  Location: [place];  Service: General;  Laterality: Right;  . REPAIR EXTENSOR TENDON Left 09/05/2017   Procedure: REPAIR EXTENSOR TENDON;  Surgeon: Carol Crawford, DPM;  Location: [facility];  Service: Podiatry;  Laterality: Left;    There were no vitals filed for this visit.   Subjective Assessment - 09/13/19 1305    Subjective My mom told me to put a piece of fat back meat on it to help it but it did not.  Its still there but not as painful.  It is just irritating now.    Pertinent History right breast cancer:  11/10/2018: Stage IA (cT1c, cN0, cM0, G1, ER+, PR+, HER2-)Right lumpectomy Carol Crawford)  IDC with DCIS, grade 1, 1.4cm, 4 axillary lymph nodes negative, Pt reports she has a seroma in that was drained 4-5 times and had a drain in place for a while  Pt with radiation 01/28/2019 - 03/31/2019 60.4 gray with boost to seroma. Pt has past history with injury to her foot in 2017 involving a laceration to several tendons.  She has had 9 surgeries and feels that she still has problems with it.  Patient Stated Goals to get progress in her arm    Currently in Pain? Yes    Pain Score 5     Pain Location Breast    Pain Orientation Right    Pain Descriptors / Indicators Aching    Pain Type Acute pain    Pain Onset More than a month ago    Pain Frequency Constant                             [facility] Adult PT Treatment/Exercise - 09/13/19 0001      Manual Therapy   Manual Therapy Soft tissue mobilization    Edema Management helped pt donn jovipak bra and bellisse bra which fits well.  Also gave pt new tg soft for th eRt UE which she notes helps alot      Soft tissue mobilization in sidelying to the Rt latissimus with tension and trigger points noted near axilla     Manual Lymphatic Drainage (MLD) in supine,  short neck, superficial and deep abdominals, right inguinal nodes and right axillo-inguinal anastamosis, left axillary nodes and anterior interaxiallry anastamosis, right breast, shoulder arm and hand with return along pathways, then to sidelying for back and posterior interaxillary anastamosis     Passive ROM to right shoulder as tolerated with elbow bent                        PT Long Term Goals - 08/30/19 1641      PT LONG TERM GOAL #1   Title Pt will reducue the Quick DASH score to < 50 indicating an improvment in function of right shoulder    Baseline 08/03/2019: 88.64    Time 8    Period Weeks    Status Not Met      PT LONG TERM GOAL #2   Title Pt will report the pain in her right breast is decreased to 5/10    Baseline 9/10 on 08/03/2019, 8/10 on 08/30/19    Time 8    Period Weeks    Status Not Met      PT LONG TERM GOAL #3   Title Pt will have  active right shoulder abudction of  140 degrees so that she can perform her ADLS better    Status Partially Met      PT LONG TERM GOAL #4   Title Pt will know how to manage her right breast lymphedema with self MLD and compression    Status Partially Met      PT LONG TERM GOAL #5   Title Pt will be independent in a home exercise program for right shoulder ROM and stregth    Status On-going                 Plan - 09/13/19 1356    Clinical Impression Statement Pt returns today without Rt breast pain and tenderness of visit 2 weeks ago although pt decided not to have the MD check the inferior breast spot.  This spot now appears like a healing pimple/ingrown hair with no concerning factors at this point.  Pt with improved breast status and minimal fibrosis although seroma still present laterally.  pt is improving PROM and AROM globally and now has her compression bra and insert..  Emailed about sleeve today    PT Frequency 2x / week    PT Duration 3 weeks    PT Treatment/Interventions ADLs/Self  Care Home  Management;Moist Heat;DME Instruction;Therapeutic activities;Therapeutic exercise;Patient/family education;Manual techniques;Manual lymph drainage;Orthotic Fit/Training;Passive range of motion;Scar mobilization;Compression bandaging;Taping    PT Next Visit Plan check goals/AROM, continue Rt breast and arm MLD, STM latissimus and Rt quadrant PRN,  see if she can tolerate pulleys    PT Home Exercise Plan self MLD    Recommended Other Services pt has bra and insert jovi pad, no sleeve yet    Consulted and Agree with Plan of Care Patient           Patient will benefit from skilled therapeutic intervention in order to improve the following deficits and impairments:     Visit Diagnosis: Lymphedema, not elsewhere classified  Acute pain of right shoulder  Stiffness of right shoulder, not elsewhere classified  Disorder of the skin and subcutaneous tissue related to radiation, unspecified     Problem List Patient Active Problem List   Diagnosis Date Noted  . Genetic testing 11/26/2018  . Family history of breast cancer   . Family history of prostate cancer   . Family history of colon cancer   . Malignant neoplasm of upper-outer quadrant of right breast in female, estrogen receptor positive ([place]) 11/10/2018  . Acute ischemic stroke ([place])   . Left sided numbness 01/09/2018  . Depressed   . Bipolar 1 disorder ([place])     Stark Bray 09/13/2019, 1:59 PM  [place], [place], [address] Phone: [phone]   Fax:  [phone]  Name: Carol Crawford MRN: [id number] Date of Birth: 1976/03/16

## 2019-09-15 ENCOUNTER — Encounter: Payer: Medicaid Other | Admitting: Physical Therapy

## 2019-09-17 ENCOUNTER — Ambulatory Visit (LOCAL_COMMUNITY_HEALTH_CENTER): Payer: Medicaid Other | Admitting: Advanced Practice Midwife

## 2019-09-17 ENCOUNTER — Other Ambulatory Visit: Payer: Self-pay

## 2019-09-17 ENCOUNTER — Encounter: Payer: Self-pay | Admitting: Advanced Practice Midwife

## 2019-09-17 VITALS — BP 124/88 | Ht 67.0 in | Wt 165.0 lb

## 2019-09-17 DIAGNOSIS — Z803 Family history of malignant neoplasm of breast: Secondary | ICD-10-CM

## 2019-09-17 DIAGNOSIS — Z3009 Encounter for other general counseling and advice on contraception: Secondary | ICD-10-CM

## 2019-09-17 DIAGNOSIS — F1210 Cannabis abuse, uncomplicated: Secondary | ICD-10-CM

## 2019-09-17 DIAGNOSIS — Z30018 Encounter for initial prescription of other contraceptives: Secondary | ICD-10-CM

## 2019-09-17 DIAGNOSIS — Z7289 Other problems related to lifestyle: Secondary | ICD-10-CM

## 2019-09-17 DIAGNOSIS — F209 Schizophrenia, unspecified: Secondary | ICD-10-CM

## 2019-09-17 DIAGNOSIS — C50411 Malignant neoplasm of upper-outer quadrant of right female breast: Secondary | ICD-10-CM

## 2019-09-17 DIAGNOSIS — F1010 Alcohol abuse, uncomplicated: Secondary | ICD-10-CM

## 2019-09-17 DIAGNOSIS — Z170 Estrogen receptor positive status [ER+]: Secondary | ICD-10-CM

## 2019-09-17 DIAGNOSIS — F17200 Nicotine dependence, unspecified, uncomplicated: Secondary | ICD-10-CM

## 2019-09-17 LAB — WET PREP FOR TRICH, YEAST, CLUE
Trichomonas Exam: NEGATIVE
Yeast Exam: NEGATIVE

## 2019-09-17 LAB — PREGNANCY, URINE: Preg Test, Ur: NEGATIVE

## 2019-09-17 NOTE — Progress Notes (Signed)
Family Planning Visit- Initial Visit  Subjective:  Carol Crawford is a 44 y.o.  [id number] smoker  being seen today for an initial well woman visit and to discuss family planning options.  She is currently using nothing for pregnancy prevention. Patient reports she does not want a pregnancy in the next year.  Patient has the following medical conditions has Depressed; Bipolar 1 disorder ([place]) with SA 2020 (OD pain pills); Left sided numbness; Acute ischemic stroke ([place]); Malignant neoplasm of upper-outer quadrant of right breast in female, estrogen receptor positive ([place]); Family history of breast cancer 09/2018 with lumpectomy 12/02/18, radiation 01/28/19-03/31/19, Tamoxifen 03/2019-03/2024; Family history of prostate cancer; Family history of colon cancer; Genetic testing; Schizophrenia ([place]); Self-mutilation 2007; Marijuana abuse daily 1-2 blunts; and Alcohol abuse 12 pack q weekend on their problem list.  Chief Complaint  Patient presents with   Contraception    Physical and Depo    Patient reports right breast cancer dx'd 09/2018, lumpectomy 12/02/18, radiation 01/28/19-03/31/19, Tamoxifen 03/2019-03/2024.  Has f/u appt with oncologist 10/2019 Dr. Lindi Adie.  Last PE 08/06/2017 with referral to [facility].  CBE due 2020.  Will have mammogram next month.  Last pap 08/06/2017 neg HPV neg and due 2024.  LMP 08/17/19. Smoker 2-4 cpd, daily MJ, ETOH 2-3x/wk with last use 09/15/19 (2 shots Petrone + 2 24 oz beer) q weekend 12 pack. Bipolar and schizophrenia.  Last sex 08/29/19 without condom; with partner x 11 years.  Cutter 2007 when DSS took her kids away.  Last SA 2020 (OD pain pills)  Patient denies SI/SA, cutting  Body mass index is 25.84 kg/m. - Patient is eligible for diabetes screening based on BMI and age >55?  no HA1C ordered? no  Patient reports 1 of partners in last year. Desires STI screening?  Yes  Has patient been screened once for HCV in the past?  No  No results found for: HCVAB  Does the patient have  current of drug use, have a partner with drug use, and/or has been incarcerated since last result? yes If yes-- Screen for HCV through [facility]   Does the patient meet criteria for HBV testing? No  Criteria:  -Household, sexual or needle sharing contact with HBV -History of drug use -HIV positive -Those with known Hep C   Health Maintenance Due  Topic Date Due   Hepatitis C Screening  Never done   COVID-19 Vaccine (1) Never done   TETANUS/TDAP  Never done    Review of Systems  All other systems reviewed and are negative.   The following portions of the patient's history were reviewed and updated as appropriate: allergies, current medications, past family history, past medical history, past social history, past surgical history and problem list. Problem list updated.   See flowsheet for other program required questions.  Objective:   Vitals:   09/17/19 1119  BP: 124/88  Weight: 165 lb (74.8 kg)  Height: 5\' 7"  (1.702 m)    Physical Exam Constitutional:      Appearance: Normal appearance. She is normal weight.  HENT:     Head: Normocephalic and atraumatic.     Mouth/Throat:     Mouth: Mucous membranes are moist.  Eyes:     Conjunctiva/sclera: Conjunctivae normal.  Cardiovascular:     Rate and Rhythm: Normal rate and regular rhythm.  Pulmonary:     Effort: Pulmonary effort is normal.     Breath sounds: Normal breath sounds.  Abdominal:     Palpations:  Abdomen is soft.     Comments: Soft without tenderness or masses, fair tone  Genitourinary:    General: Normal vulva.     Exam position: Lithotomy position.     Rectum: Normal.  Musculoskeletal:        General: Normal range of motion.     Cervical back: Normal range of motion and neck supple.  Skin:    General: Skin is warm and dry.  Neurological:     Mental Status: She is alert.  Psychiatric:        Mood and Affect: Mood normal.   Lymphadema on right arm--pt wearing compression sleeve and has PT  2x/wk Left foot with scar and 2 areas that appear to be draining--pt states has had numerous surgeries and infections since original 2018 surgery by Dr. Vickki Muff    Assessment and Plan:  Carol Crawford is a 44 y.o. female presenting to the [facility] Department for an initial well woman exam/family planning visit  Contraception counseling: Reviewed all forms of birth control options in the tiered based approach. available including abstinence; over the counter/barrier methods; hormonal contraceptive medication including pill, patch, ring, injection,contraceptive implant, ECP; hormonal and nonhormonal IUDs; permanent sterilization options including vasectomy and the various tubal sterilization modalities. Risks, benefits, and typical effectiveness rates were reviewed.  Questions were answered.  Written information was also given to the patient to review.  Patient desires DMPA, this was not prescribed for patient. She will follow up with her oncologist to discuss appropriate birth control options with her estrogen receptor + breast cancer tumor. She was told to call with any further questions, or with any concerns about this method of contraception.  Emphasized use of condoms 100% of the time for STI prevention. ECP was not offered to pt due to her +estrogen receptor tumor history  1. Family planning Treat wet mount per standing orders Immunization nurse consult Pt agrees to talk to oncologist and get recommendations for appropriate birth control with her history +estrogen receptor breast cancer history and Tamoxifen daily, smoker.  Oncologist needs to notify [place] either by phone or in writing re: birth control Referred back to Dr. Vickki Muff for left foot issues due to surgery 2018   - Pregnancy, urine - WET PREP FOR [place], YEAST, CLUE - Syphilis Serology, [facility]  2. Encounter for initial prescription of other  contraceptives Please give condoms to pt  3. Schizophrenia, unspecified type ([place]) Pt states will f/u with oncology team in [place] to get back on meds and couonseling Denies current SI/HI PHQ-9=14  4. Self-mutilation 2007 Pt plans to contact counselor  5. Marijuana abuse daily 1-2 blunts Counseled not to use 6. Alcohol abuse 12 pack q weekend Pt declines ETOH cessation assistance  7. Family history of breast cancer 09/2018 with lumpectomy 12/02/18, radiation 01/28/19-03/31/19, Tamoxifen 03/2019-03/2024   8. Malignant neoplasm of upper-outer quadrant of right breast in female, estrogen receptor positive ([place]) Pt agrees to call oncologist and get appropriate recommendations for birth control and vaginal dryness and will notify us of recommendations     No follow-ups on file.  Future Appointments  Date Time Provider [place]  09/21/2019  1:00 PM Kipp Laurence, PT [facility] None  09/23/2019  1:00 PM Kipp Laurence, PT [facility] None  09/28/2019  1:00 PM Kipp Laurence, PT [facility] None  09/30/2019  1:00 PM Kipp Laurence, PT [facility] None  11/01/2019 11:15 AM Lindi Adie,  Loleta Dicker, MD [place] None    Herbie Saxon, CNM

## 2019-09-17 NOTE — Progress Notes (Signed)
UPT is negative today. Wet mount reviewed with provider and no treatment needed for wet mount per E. Sciora, CNM verbal order as pt is not having any symptoms. Counseled pt per provider orders and pt states understanding. Counseled pt to keep upcoming appt with oncologist and gave pt our phone # and fax # per Ola Spurr, CNM verbal order. Provider orders completed.

## 2019-09-17 NOTE — Progress Notes (Signed)
Pt here for physical and to start Depo. Pt filling out PHQ9. Pt reports periods are irregular. Pt reports last sex was ~3 weeks ago. Pt desires STD screening and blood work today. Pt currently going through treatment for breast cancer per pt.

## 2019-09-21 ENCOUNTER — Ambulatory Visit: Payer: Medicaid Other | Admitting: Physical Therapy

## 2019-09-21 ENCOUNTER — Encounter: Payer: Self-pay | Admitting: Physical Therapy

## 2019-09-21 ENCOUNTER — Other Ambulatory Visit: Payer: Self-pay

## 2019-09-21 DIAGNOSIS — I890 Lymphedema, not elsewhere classified: Secondary | ICD-10-CM | POA: Diagnosis not present

## 2019-09-21 DIAGNOSIS — M25611 Stiffness of right shoulder, not elsewhere classified: Secondary | ICD-10-CM

## 2019-09-21 DIAGNOSIS — L599 Disorder of the skin and subcutaneous tissue related to radiation, unspecified: Secondary | ICD-10-CM

## 2019-09-21 DIAGNOSIS — M25511 Pain in right shoulder: Secondary | ICD-10-CM

## 2019-09-21 NOTE — Patient Instructions (Signed)

## 2019-09-21 NOTE — Therapy (Signed)
[place], [place], [address] Phone: [phone]   Fax:  [phone]  Physical Therapy Treatment  Patient Details  Name: Carol Crawford MRN: [id number] Date of Birth: [birth year] Referring Provider (PT): Narda Amber    Encounter Date: 09/21/2019   PT End of Session - 09/21/19 1747    Visit Number 7    Number of Visits 12    Date for PT Re-Evaluation 10/04/19    PT Start Time 1306    PT Stop Time 1350    PT Time Calculation (min) 44 min    Activity Tolerance Patient tolerated treatment well    Behavior During Therapy [facility] for tasks assessed/performed           Past Medical History:  Diagnosis Date  . ADHD   . Bipolar 1 disorder ([place])   . Breast cancer ([place])    currently in treatment for  . Breast disorder    right breast cancer  . Depressed   . Drug overdose 2010  . Family history of breast cancer   . Family history of colon cancer   . Family history of prostate cancer   . GERD (gastroesophageal reflux disease)    OCC  . Headache    MIGRAINES  . History of MRSA infection    foot infection  . Migraine   . Migraine   . Schizophrenia ([place])   . Stroke [facility])    after foot surgery, left side was numb, fully recovered    Past Surgical History:  Procedure Laterality Date  . BREAST BIOPSY Right 10/21/2018   Affirm Bx-"X" clip-path pending  . BREAST LUMPECTOMY WITH RADIOACTIVE SEED AND SENTINEL LYMPH NODE BIOPSY Right 12/02/2018   Procedure: RIGHT BREAST LUMPECTOMY WITH RADIOACTIVE SEED AND SENTINEL LYMPH NODE BIOPSY;  Surgeon: Jovita Kussmaul, MD;  Location: [place];  Service: General;  Laterality: Right;  . INCISION AND DRAINAGE Left 01/09/2018   Procedure: INCISION AND DRAINAGE-below fascia foot; multiple;  Surgeon: [name], DPM;  Location: [facility];  Service: Podiatry;  Laterality: Left;  . INCISION AND DRAINAGE Left 04/17/2018   Procedure: I&D; BELOW FASCIA  FOOT; MULTIPLE;  Surgeon: [name], DPM;  Location: [facility];  Service: Podiatry;  Laterality: Left;  . INCISION AND DRAINAGE Right 12/18/2018   Procedure: PLACEMENT OF DRAIN IN RIGHT AXILLA;  Surgeon: Jovita Kussmaul, MD;  Location: [place];  Service: General;  Laterality: Right;  . REPAIR EXTENSOR TENDON Left 09/05/2017   Procedure: REPAIR EXTENSOR TENDON;  Surgeon: [name], DPM;  Location: [facility];  Service: Podiatry;  Laterality: Left;    There were no vitals filed for this visit.   Subjective Assessment - 09/21/19 1307    Subjective Pt states things are going "great"  She is still having a little pain in the right shoulder. She is wearing a compression sleeve and glove that looks like it fits well.  She just got it Friday and is not having any trouble getting it on or off She also got a compression bra ( Belissa with lumpectomy jovi swell pad that goes around to the back) She sometimes wears it at night and she is doing with that too but she cannot zip it because of right arm pain and limitation    Pertinent History right breast cancer:  11/10/2018: Stage IA (cT1c, cN0, cM0, G1, ER+, PR+, HER2-)Right lumpectomy [name])  IDC with DCIS, grade 1, 1.4cm, 4 axillary lymph nodes negative, Pt reports  she has a seroma in that was drained 4-5 times and had a drain in place for a while  Pt with radiation 01/28/2019 - 03/31/2019 60.4 gray with boost to seroma. Pt has past history with injury to her foot in 2017 involving a laceration to several tendons.  She has had 9 surgeries and feels that she still has problems with it.    Patient Stated Goals to get progress in her arm    Currently in Pain? Yes    Pain Score 5     Pain Location Shoulder    Pain Descriptors / Indicators Aching    Pain Type Chronic pain    Pain Onset More than a month ago    Aggravating Factors  certain motions of her arm  and lifting                 LYMPHEDEMA/ONCOLOGY QUESTIONNAIRE - 09/21/19 0001      Right Upper  Extremity Lymphedema   10 cm Proximal to Olecranon Process 28.2 cm    Olecranon Process 25.6 cm    15 cm Proximal to Ulnar Styloid Process 26.9 cm    10 cm Proximal to Ulnar Styloid Process 23.2 cm    Just Proximal to Ulnar Styloid Process 16.1 cm    Across Hand at [employer] 20 cm    At [place] of 2nd Digit 5.9 cm                      [facility] Adult PT Treatment/Exercise - 09/21/19 0001      Exercises   Exercises Shoulder      Shoulder Exercises: Supine   Horizontal ABduction Strengthening;Both;5 reps;Theraband    Theraband Level (Shoulder Horizontal ABduction) Level 1 (Yellow)    External Rotation Strengthening;Both;5 reps;Theraband    Theraband Level (Shoulder External Rotation) Level 1 (Yellow)    Flexion Strengthening;Both;5 reps;Theraband   wide and narrow grip    Theraband Level (Shoulder Flexion) Level 1 (Yellow)    Diagonals Strengthening;Both;5 reps;Theraband    Theraband Level (Shoulder Diagonals) Level 1 (Yellow)      Shoulder Exercises: Pulleys   Flexion 2 minutes    Scaption 2 minutes      Shoulder Exercises: ROM/Strengthening   Ball on Wall 10 reps with yellow ball with lean in at the end     Other ROM/Strengthening Exercises forearms on bolster for scpualar mobility up the wall +568                  PT Education - 09/21/19 1746    Education Details supine scapular series    Person(s) Educated Patient    Methods Explanation;Demonstration;Handout    Comprehension Verbalized understanding;Returned demonstration               PT Long Term Goals - 09/21/19 1311      PT LONG TERM GOAL #1   Title Pt will reducue the Quick DASH score to < 50 indicating an improvment in function of right shoulder    Baseline 08/03/2019: 88.64    Status On-going      PT LONG TERM GOAL #2   Title Pt will report the pain in her right breast is decreased to 5/10    Baseline 9/10 on 08/03/2019, 8/10 on 08/30/19, 5/10 on 09/21/2019    Status Achieved      PT  LONG TERM GOAL #3   Title Pt will have  active right shoulder abudction of  140 degrees so  that she can perform her ADLS better    Baseline 90 degrees on 08/03/2019,      PT LONG TERM GOAL #4   Title Pt will know how to manage her right breast lymphedema with self MLD and compression    Baseline no knowledge on 5/11/ 2021, pt got her comprssion bra and she knows how to do MLD    Status Achieved      PT LONG TERM GOAL #5   Title Pt will be independent in a home exercise program for right shoulder ROM and stregth    Baseline no knowledge    Time 8    Period Weeks    Status On-going                 Plan - 09/21/19 1747    Clinical Impression Statement Pt is making good progress. She is doing well with Belisse bra and swell pad. Breast is soft appears to be reduced and less painful Pt is making progress with shoulder ROM,yellow  theraband ex added today    Personal Factors and Comorbidities Comorbidity 3+    Comorbidities surgery, 4 nodes removed, seroma with drainage, radiation    Examination-Activity Limitations Reach Overhead;Carry;Dressing;Hygiene/Grooming    Stability/Clinical Decision Making Stable/Uncomplicated    Rehab Potential Good    PT Frequency 2x / week    PT Duration 3 weeks    PT Treatment/Interventions ADLs/Self Care Home Management;Moist Heat;DME Instruction;Therapeutic activities;Therapeutic exercise;Patient/family education;Manual techniques;Manual lymph drainage;Orthotic Fit/Training;Passive range of motion;Scar mobilization;Compression bandaging;Taping    PT Next Visit Plan supine scapu ok? if so progress ROM and exercise check goals/AROM, continue Rt breast and arm MLD, STM latissimus and Rt quadrant PRN,  see if she can tolerate pulleys    Consulted and Agree with Plan of Care Patient           Patient will benefit from skilled therapeutic intervention in order to improve the following deficits and impairments:  Decreased activity tolerance, Decreased  knowledge of use of DME, Decreased strength, Increased fascial restricitons, Impaired UE functional use, Pain, Increased edema, Decreased scar mobility, Decreased range of motion, Decreased knowledge of precautions, Decreased skin integrity  Visit Diagnosis: Lymphedema, not elsewhere classified  Acute pain of right shoulder  Stiffness of right shoulder, not elsewhere classified  Disorder of the skin and subcutaneous tissue related to radiation, unspecified     Problem List Patient Active Problem List   Diagnosis Date Noted  . Schizophrenia ([place]) 09/17/2019  . Self-mutilation 2007 09/17/2019  . Marijuana abuse daily 1-2 blunts 09/17/2019  . Alcohol abuse 12 pack q weekend 09/17/2019  . Smoker 2-4 cpd 09/17/2019  . Genetic testing 11/26/2018  . Family history of breast cancer 09/2018 with lumpectomy 12/02/18, radiation 01/28/19-03/31/19, Tamoxifen 03/2019-03/2024   . Family history of prostate cancer   . Family history of colon cancer   . Malignant neoplasm of upper-outer quadrant of right breast in female, estrogen receptor positive ([place]) 11/10/2018  . Acute ischemic stroke ([place])   . Left sided numbness 01/09/2018  . Depressed   . Bipolar 1 disorder ([place]) with SA 2020 (OD pain pills)    Donato Heinz. Owens Shark PT  Norwood Levo 09/21/2019, 5:50 PM  [place], [place], [address] Phone: [phone]   Fax:  [phone]  Name: Carol Crawford MRN: [id number] Date of Birth: [birth year]

## 2019-09-23 ENCOUNTER — Other Ambulatory Visit: Payer: Self-pay

## 2019-09-23 ENCOUNTER — Ambulatory Visit: Payer: Medicaid Other | Attending: Adult Health | Admitting: Physical Therapy

## 2019-09-23 DIAGNOSIS — L599 Disorder of the skin and subcutaneous tissue related to radiation, unspecified: Secondary | ICD-10-CM | POA: Diagnosis present

## 2019-09-23 DIAGNOSIS — M25511 Pain in right shoulder: Secondary | ICD-10-CM | POA: Insufficient documentation

## 2019-09-23 DIAGNOSIS — I890 Lymphedema, not elsewhere classified: Secondary | ICD-10-CM | POA: Insufficient documentation

## 2019-09-23 DIAGNOSIS — M25611 Stiffness of right shoulder, not elsewhere classified: Secondary | ICD-10-CM | POA: Diagnosis present

## 2019-09-23 NOTE — Therapy (Signed)
[place], [place], [address] Phone: [phone]   Fax:  [phone]  Physical Therapy Treatment  Patient Details  Name: Carol Crawford MRN: [id number] Date of Birth: 02-Aug-1975 Referring Provider (PT): Narda Amber    Encounter Date: 09/23/2019   PT End of Session - 09/23/19 1400    Visit Number 8    Number of Visits 12    Date for PT Re-Evaluation 10/04/19    PT Start Time 1300    PT Stop Time 1345    PT Time Calculation (min) 45 min    Activity Tolerance Patient tolerated treatment well    Behavior During Therapy [facility] for tasks assessed/performed           Past Medical History:  Diagnosis Date  . ADHD   . Bipolar 1 disorder ([place])   . Breast cancer ([place])    currently in treatment for  . Breast disorder    right breast cancer  . Depressed   . Drug overdose 2010  . Family history of breast cancer   . Family history of colon cancer   . Family history of prostate cancer   . GERD (gastroesophageal reflux disease)    OCC  . Headache    MIGRAINES  . History of MRSA infection    foot infection  . Migraine   . Migraine   . Schizophrenia ([place])   . Stroke [facility])    after foot surgery, left side was numb, fully recovered    Past Surgical History:  Procedure Laterality Date  . BREAST BIOPSY Right 10/21/2018   Affirm Bx-"X" clip-path pending  . BREAST LUMPECTOMY WITH RADIOACTIVE SEED AND SENTINEL LYMPH NODE BIOPSY Right 12/02/2018   Procedure: RIGHT BREAST LUMPECTOMY WITH RADIOACTIVE SEED AND SENTINEL LYMPH NODE BIOPSY;  Surgeon: Jovita Kussmaul, MD;  Location: [place];  Service: General;  Laterality: Right;  . INCISION AND DRAINAGE Left 01/09/2018   Procedure: INCISION AND DRAINAGE-below fascia foot; multiple;  Surgeon: [name], DPM;  Location: [facility];  Service: Podiatry;  Laterality: Left;  . INCISION AND DRAINAGE Left 04/17/2018   Procedure: I&D; BELOW FASCIA FOOT;  MULTIPLE;  Surgeon: [name], DPM;  Location: [facility];  Service: Podiatry;  Laterality: Left;  . INCISION AND DRAINAGE Right 12/18/2018   Procedure: PLACEMENT OF DRAIN IN RIGHT AXILLA;  Surgeon: Jovita Kussmaul, MD;  Location: [place];  Service: General;  Laterality: Right;  . REPAIR EXTENSOR TENDON Left 09/05/2017   Procedure: REPAIR EXTENSOR TENDON;  Surgeon: [name], DPM;  Location: [facility];  Service: Podiatry;  Laterality: Left;    There were no vitals filed for this visit.   Subjective Assessment - 09/23/19 1313    Subjective brings in her her nighttime garments ( breast swell spot and tribute with outer jacket) Reviewed how to apply them and they appear to fit well she was able to do the theraband exercises at home    Pertinent History right breast cancer:  11/10/2018: Stage IA (cT1c, cN0, cM0, G1, ER+, PR+, HER2-)Right lumpectomy [name])  IDC with DCIS, grade 1, 1.4cm, 4 axillary lymph nodes negative, Pt reports she has a seroma in that was drained 4-5 times and had a drain in place for a while  Pt with radiation 01/28/2019 - 03/31/2019 60.4 gray with boost to seroma. Pt has past history with injury to her foot in 2017 involving a laceration to several tendons.  She has had 9 surgeries and feels  that she still has problems with it.    Patient Stated Goals to get progress in her arm    Currently in Pain? Yes    Pain Score 5     Pain Location Axilla    Pain Orientation Right    Pain Descriptors / Indicators Aching    Pain Type Chronic pain    Pain Radiating Towards to right elbow    Pain Onset More than a month ago                             [facility] Adult PT Treatment/Exercise - 09/23/19 0001      Exercises   Exercises Shoulder      Shoulder Exercises: Stretch   Cross Chest Stretch 3 reps    Wall Stretch - Flexion 3 reps    Wall Stretch - ABduction 3 reps      Manual Therapy   Manual Therapy Soft tissue mobilization;Myofascial release;Manual Lymphatic  Drainage (MLD)    Soft tissue mobilization in sidelying, with eurcerin cream,  to the Rt latissimus with tension and trigger points noted near axilla     Manual Lymphatic Drainage (MLD) briefly, to left lateral chest     Passive ROM to right shoulder in end range of motion                        PT Long Term Goals - 09/21/19 1311      PT LONG TERM GOAL #1   Title Pt will reducue the Quick DASH score to < 50 indicating an improvment in function of right shoulder    Baseline 08/03/2019: 88.64    Status On-going      PT LONG TERM GOAL #2   Title Pt will report the pain in her right breast is decreased to 5/10    Baseline 9/10 on 08/03/2019, 8/10 on 08/30/19, 5/10 on 09/21/2019    Status Achieved      PT LONG TERM GOAL #3   Title Pt will have  active right shoulder abudction of  140 degrees so that she can perform her ADLS better    Baseline 90 degrees on 08/03/2019,      PT LONG TERM GOAL #4   Title Pt will know how to manage her right breast lymphedema with self MLD and compression    Baseline no knowledge on 5/11/ 2021, pt got her comprssion bra and she knows how to do MLD    Status Achieved      PT LONG TERM GOAL #5   Title Pt will be independent in a home exercise program for right shoulder ROM and stregth    Baseline no knowledge    Time 8    Period Weeks    Status On-going                 Plan - 09/23/19 1401    Clinical Impression Statement Pt continues to improve.  She brings in her nighttime compresstion garments today that appear to fit well, so she has day and night garments.  Pt continues to have tightness and soreness in pec major muscles even after some softening with soft tissue work.    Personal Factors and Comorbidities Comorbidity 3+    Comorbidities surgery, 4 nodes removed, seroma with drainage, radiation    Examination-Activity Limitations Reach Overhead;Carry;Dressing;Hygiene/Grooming    Stability/Clinical Decision Making Stable/Uncomplicated     Rehab Potential Good  PT Frequency 2x / week    PT Duration 3 weeks    PT Treatment/Interventions ADLs/Self Care Home Management;Moist Heat;DME Instruction;Therapeutic activities;Therapeutic exercise;Patient/family education;Manual techniques;Manual lymph drainage;Orthotic Fit/Training;Passive range of motion;Scar mobilization;Compression bandaging;Taping    PT Next Visit Plan , continue Rt breast and arm MLD, STM latissimus and Rt quadrant PRN, teach Strength ABC prior to discharge    PT Home Exercise Plan self MLD    Consulted and Agree with Plan of Care Patient           Patient will benefit from skilled therapeutic intervention in order to improve the following deficits and impairments:  Decreased activity tolerance, Decreased knowledge of use of DME, Decreased strength, Increased fascial restricitons, Impaired UE functional use, Pain, Increased edema, Decreased scar mobility, Decreased range of motion, Decreased knowledge of precautions, Decreased skin integrity  Visit Diagnosis: Lymphedema, not elsewhere classified  Acute pain of right shoulder  Stiffness of right shoulder, not elsewhere classified  Disorder of the skin and subcutaneous tissue related to radiation, unspecified     Problem List Patient Active Problem List   Diagnosis Date Noted  . Schizophrenia ([place]) 09/17/2019  . Self-mutilation 2007 09/17/2019  . Marijuana abuse daily 1-2 blunts 09/17/2019  . Alcohol abuse 12 pack q weekend 09/17/2019  . Smoker 2-4 cpd 09/17/2019  . Genetic testing 11/26/2018  . Family history of breast cancer 09/2018 with lumpectomy 12/02/18, radiation 01/28/19-03/31/19, Tamoxifen 03/2019-03/2024   . Family history of prostate cancer   . Family history of colon cancer   . Malignant neoplasm of upper-outer quadrant of right breast in female, estrogen receptor positive ([place]) 11/10/2018  . Acute ischemic stroke ([place])   . Left sided numbness 01/09/2018  . Depressed   . Bipolar 1 disorder  ([place]) with SA 2020 (OD pain pills)    Donato Heinz. Owens Shark PT  Norwood Levo 09/23/2019, 2:04 PM  [place], [place], [address] Phone: [phone]   Fax:  [phone]  Name: Carol Crawford MRN: [id number] Date of Birth: June 25, 1975

## 2019-09-28 ENCOUNTER — Encounter: Payer: Self-pay | Admitting: Physical Therapy

## 2019-09-28 ENCOUNTER — Ambulatory Visit: Payer: Medicaid Other | Admitting: Physical Therapy

## 2019-09-28 ENCOUNTER — Other Ambulatory Visit: Payer: Self-pay

## 2019-09-28 DIAGNOSIS — M25611 Stiffness of right shoulder, not elsewhere classified: Secondary | ICD-10-CM

## 2019-09-28 DIAGNOSIS — M25511 Pain in right shoulder: Secondary | ICD-10-CM

## 2019-09-28 DIAGNOSIS — L599 Disorder of the skin and subcutaneous tissue related to radiation, unspecified: Secondary | ICD-10-CM

## 2019-09-28 DIAGNOSIS — I890 Lymphedema, not elsewhere classified: Secondary | ICD-10-CM

## 2019-09-28 NOTE — Therapy (Signed)
[place], [place], [address] Phone: [phone]   Fax:  [phone]  Physical Therapy Treatment  Patient Details  Name: Carol Crawford MRN: [id number] Date of Birth: 04-21-1975 Referring Provider (PT): Narda Amber    Encounter Date: 09/28/2019   PT End of Session - 09/28/19 1348    Visit Number 9    Number of Visits 12    Date for PT Re-Evaluation 10/04/19    PT Start Time 1302    PT Stop Time 1345    PT Time Calculation (min) 43 min    Activity Tolerance Patient tolerated treatment well    Behavior During Therapy [facility] for tasks assessed/performed           Past Medical History:  Diagnosis Date  . ADHD   . Bipolar 1 disorder ([place])   . Breast cancer ([place])    currently in treatment for  . Breast disorder    right breast cancer  . Depressed   . Drug overdose 2010  . Family history of breast cancer   . Family history of colon cancer   . Family history of prostate cancer   . GERD (gastroesophageal reflux disease)    OCC  . Headache    MIGRAINES  . History of MRSA infection    foot infection  . Migraine   . Migraine   . Schizophrenia ([place])   . Stroke [facility])    after foot surgery, left side was numb, fully recovered    Past Surgical History:  Procedure Laterality Date  . BREAST BIOPSY Right 10/21/2018   Affirm Bx-"X" clip-path pending  . BREAST LUMPECTOMY WITH RADIOACTIVE SEED AND SENTINEL LYMPH NODE BIOPSY Right 12/02/2018   Procedure: RIGHT BREAST LUMPECTOMY WITH RADIOACTIVE SEED AND SENTINEL LYMPH NODE BIOPSY;  Surgeon: Jovita Kussmaul, MD;  Location: [place];  Service: General;  Laterality: Right;  . INCISION AND DRAINAGE Left 01/09/2018   Procedure: INCISION AND DRAINAGE-below fascia foot; multiple;  Surgeon: [name], DPM;  Location: [facility];  Service: Podiatry;  Laterality: Left;  . INCISION AND DRAINAGE Left 04/17/2018   Procedure: I&D; BELOW FASCIA FOOT;  MULTIPLE;  Surgeon: [name], DPM;  Location: [facility];  Service: Podiatry;  Laterality: Left;  . INCISION AND DRAINAGE Right 12/18/2018   Procedure: PLACEMENT OF DRAIN IN RIGHT AXILLA;  Surgeon: Jovita Kussmaul, MD;  Location: [place];  Service: General;  Laterality: Right;  . REPAIR EXTENSOR TENDON Left 09/05/2017   Procedure: REPAIR EXTENSOR TENDON;  Surgeon: [name], DPM;  Location: [facility];  Service: Podiatry;  Laterality: Left;    There were no vitals filed for this visit.   Subjective Assessment - 09/28/19 1312    Subjective She said a little swelling over the weekend, but she used her garments and self MLD and she feels better. She haing a little pain in her breast today and does not have her compression bar on at this time even though right breast is visibly larger through her tshirt.  She said she took off her compression bra this morning and will put it back on when she gets home    Pertinent History right breast cancer:  11/10/2018: Stage IA (cT1c, cN0, cM0, G1, ER+, PR+, HER2-)Right lumpectomy [name])  IDC with DCIS, grade 1, 1.4cm, 4 axillary lymph nodes negative, Pt reports she has a seroma in that was drained 4-5 times and had a drain in place for a while  Pt with  radiation 01/28/2019 - 03/31/2019 60.4 gray with boost to seroma. Pt has past history with injury to her foot in 2017 involving a laceration to several tendons.  She has had 9 surgeries and feels that she still has problems with it.    Patient Stated Goals to get progress in her arm    Currently in Pain? Yes    Pain Score 6     Pain Location Axilla    Pain Orientation Right    Pain Descriptors / Indicators Aching    Pain Type Chronic pain    Pain Radiating Towards to breast    Pain Onset More than a month ago                             [facility] Adult PT Treatment/Exercise - 09/28/19 0001      Exercises   Exercises Other Exercises    Other Exercises  Instructed in basics of Stregth ABC program  with warm up, stretches and progress.  Pt acknowledged understanding.  Modified bird dog to do in standing on arm chair.  Omitted curl ups and added sidelying clam and hip abduciton that pt had some difficulty with due to deconditioning of hip abductors. She did well with all other exercises as she has experience in the gym previously.                    PT Education - 09/28/19 1348    Education Details Strength ABC program    Person(s) Educated Patient    Methods Explanation;Demonstration;Tactile cues;Verbal cues;Handout    Comprehension Verbalized understanding;Returned demonstration               PT Long Term Goals - 09/21/19 1311      PT LONG TERM GOAL #1   Title Pt will reducue the Quick DASH score to < 50 indicating an improvment in function of right shoulder    Baseline 08/03/2019: 88.64    Status On-going      PT LONG TERM GOAL #2   Title Pt will report the pain in her right breast is decreased to 5/10    Baseline 9/10 on 08/03/2019, 8/10 on 08/30/19, 5/10 on 09/21/2019    Status Achieved      PT LONG TERM GOAL #3   Title Pt will have  active right shoulder abudction of  140 degrees so that she can perform her ADLS better    Baseline 90 degrees on 08/03/2019,      PT LONG TERM GOAL #4   Title Pt will know how to manage her right breast lymphedema with self MLD and compression    Baseline no knowledge on 5/11/ 2021, pt got her comprssion bra and she knows how to do MLD    Status Achieved      PT LONG TERM GOAL #5   Title Pt will be independent in a home exercise program for right shoulder ROM and stregth    Baseline no knowledge    Time 8    Period Weeks    Status On-going                 Plan - 09/28/19 1349    Clinical Impression Statement Pt is improving overall, but she continues to have pain and swelling in her left axilla and breast.  She reports she knows how to do self MLD and use of compression to manage it. She was instructed in  Strength ABC  program today and did well with it, but would benefit from a review next session.    Comorbidities surgery, 4 nodes removed, seroma with drainage, radiation    Examination-Activity Limitations Reach Overhead;Carry;Dressing;Hygiene/Grooming    Stability/Clinical Decision Making Stable/Uncomplicated    Rehab Potential Good    PT Frequency 2x / week    PT Duration 3 weeks    PT Treatment/Interventions ADLs/Self Care Home Management;Moist Heat;DME Instruction;Therapeutic activities;Therapeutic exercise;Patient/family education;Manual techniques;Manual lymph drainage;Orthotic Fit/Training;Passive range of motion;Scar mobilization;Compression bandaging;Taping    PT Next Visit Plan Reassess for goals consider discharge,  Pt may need  a renewal and reauthorizaion  if she wants to continue. Review strenth ABC, stretched and use of compression and MLD. consider Flexitouch if pt continues with pesistent breast swelling    PT Home Exercise Plan self MLD    Consulted and Agree with Plan of Care Patient           Patient will benefit from skilled therapeutic intervention in order to improve the following deficits and impairments:  Decreased activity tolerance, Decreased knowledge of use of DME, Decreased strength, Increased fascial restricitons, Impaired UE functional use, Pain, Increased edema, Decreased scar mobility, Decreased range of motion, Decreased knowledge of precautions, Decreased skin integrity  Visit Diagnosis: Lymphedema, not elsewhere classified  Acute pain of right shoulder  Stiffness of right shoulder, not elsewhere classified  Disorder of the skin and subcutaneous tissue related to radiation, unspecified     Problem List Patient Active Problem List   Diagnosis Date Noted  . Schizophrenia ([place]) 09/17/2019  . Self-mutilation 2007 09/17/2019  . Marijuana abuse daily 1-2 blunts 09/17/2019  . Alcohol abuse 12 pack q weekend 09/17/2019  . Smoker 2-4 cpd 09/17/2019  . Genetic  testing 11/26/2018  . Family history of breast cancer 09/2018 with lumpectomy 12/02/18, radiation 01/28/19-03/31/19, Tamoxifen 03/2019-03/2024   . Family history of prostate cancer   . Family history of colon cancer   . Malignant neoplasm of upper-outer quadrant of right breast in female, estrogen receptor positive ([place]) 11/10/2018  . Acute ischemic stroke ([place])   . Left sided numbness 01/09/2018  . Depressed   . Bipolar 1 disorder ([place]) with SA 2020 (OD pain pills)    Donato Heinz. Owens Shark PT  [place] Levo 09/28/2019, 1:54 PM  [place], [place], [address] Phone: [phone]   Fax:  [phone]  Name: Carol Crawford MRN: [id number] Date of Birth: 08-30-1975

## 2019-09-30 ENCOUNTER — Other Ambulatory Visit: Payer: Self-pay | Admitting: Hematology and Oncology

## 2019-09-30 ENCOUNTER — Encounter: Payer: Medicaid Other | Admitting: Physical Therapy

## 2019-10-01 ENCOUNTER — Ambulatory Visit: Payer: Medicaid Other | Admitting: Rehabilitation

## 2019-10-08 IMAGING — CT CT HEAD CODE STROKE
3 of 4 series · 16 of 47 positions shown, 19 images · non-contrast
Comparison: Head CT without contrast 08/12/2010.

CLINICAL DATA: Code stroke. 41-year-old female with left side
weakness, extremity numbness.

EXAM:
CT HEAD WITHOUT CONTRAST
TECHNIQUE: Contiguous axial images were obtained from the base of the skull
through the vertex without intravenous contrast.

[Series 3: head wo · axial · 0.41mm/px · z∈[+262,+382]mm · 10 of 29 slices shown, 13 images]
[im 3/29  brain]
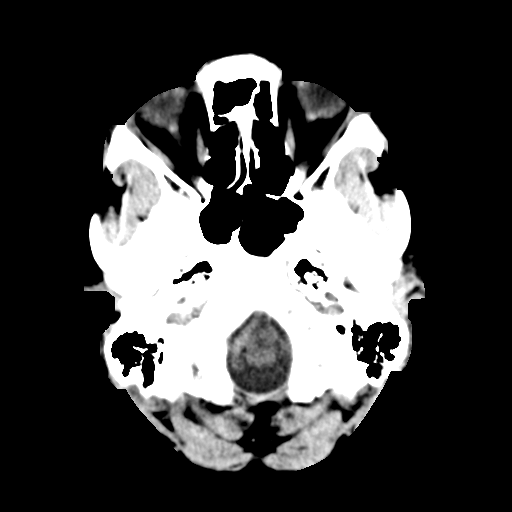
[im 3/29  bone]
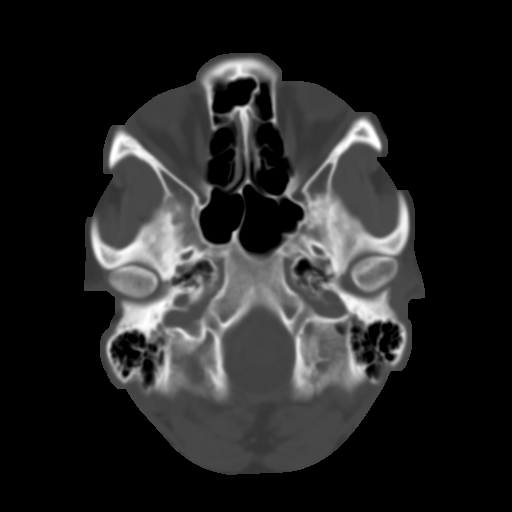
[im 5/29  brain]
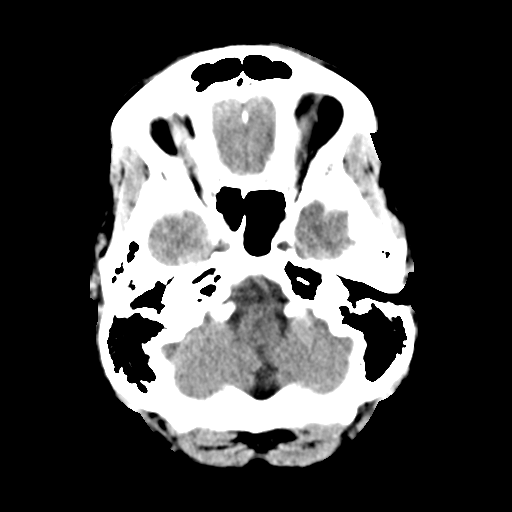
[im 9/29  brain]
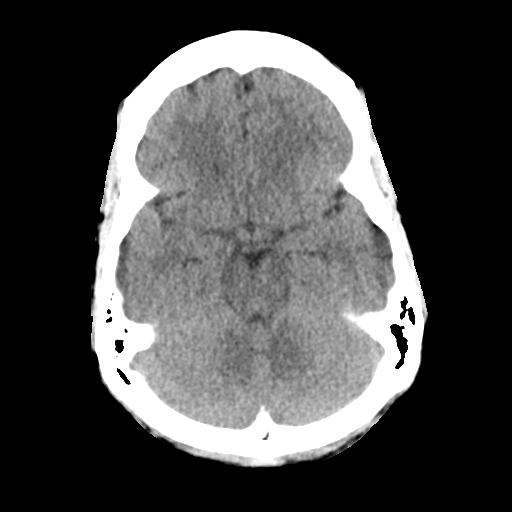
[im 11/29  brain]
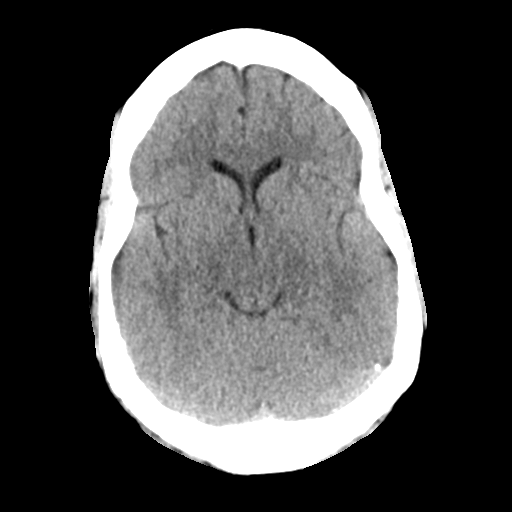
[im 13/29  brain]
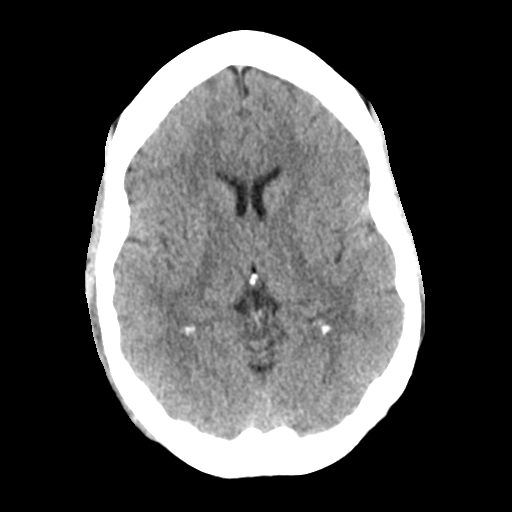
[im 13/29  bone]
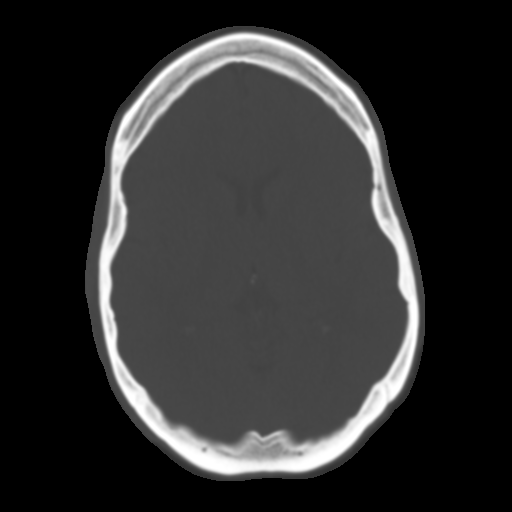
[im 17/29  brain]
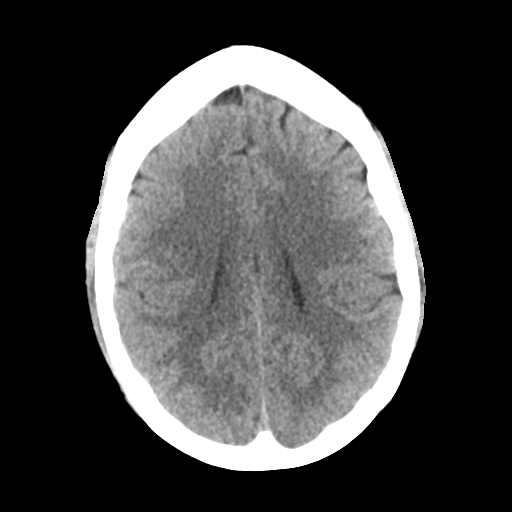
[im 19/29  brain]
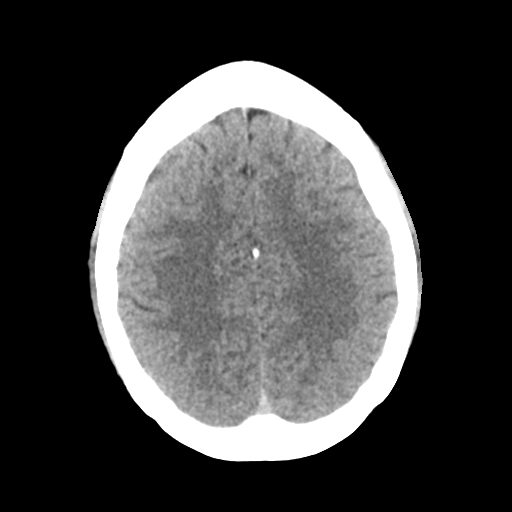
[im 21/29  brain]
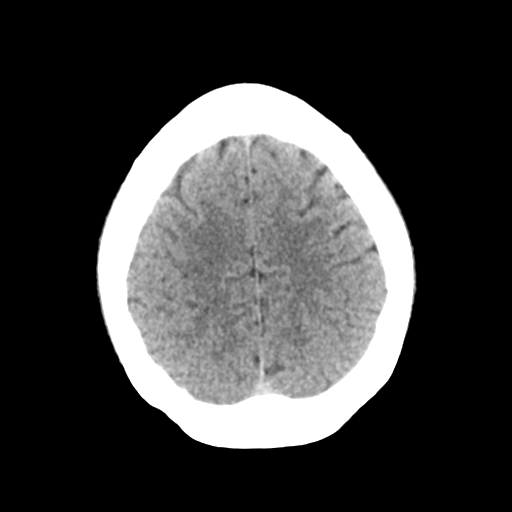
[im 25/29  brain]
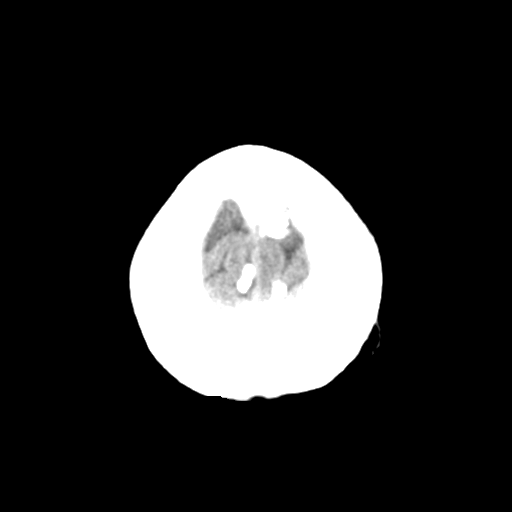
[im 25/29  bone]
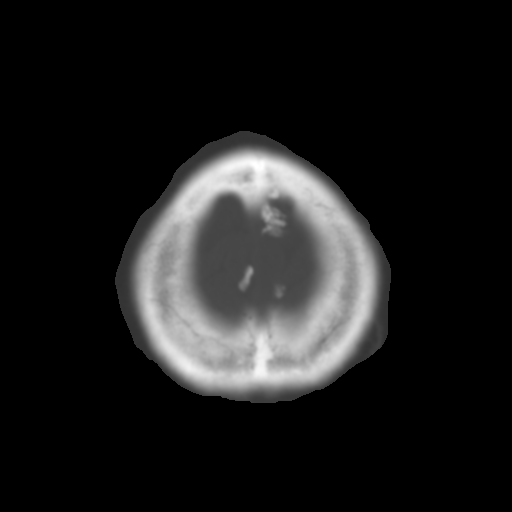
[im 27/29  brain]
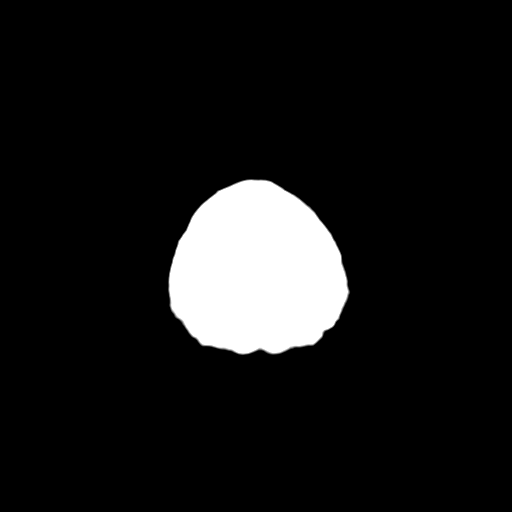

[Series 4: coronal soft tissue · coronal · 0.33mm/px · 3 of 62 slices shown]
[im 21/62  brain]
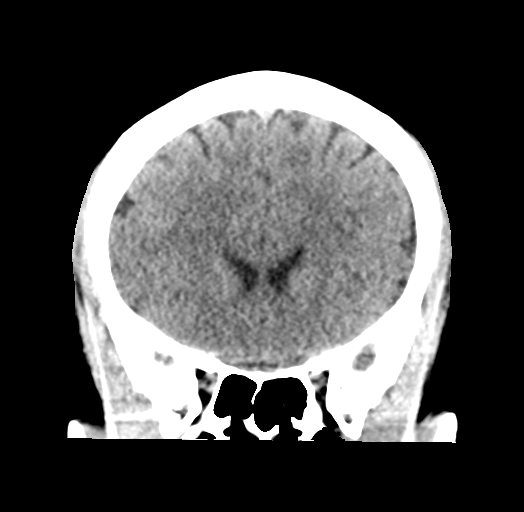
[im 28/62  brain]
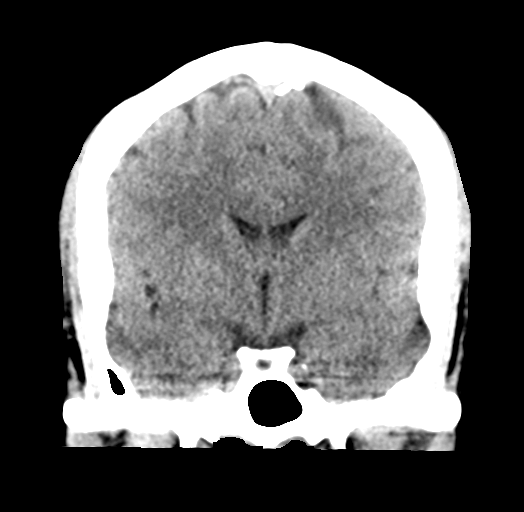
[im 34/62  brain]
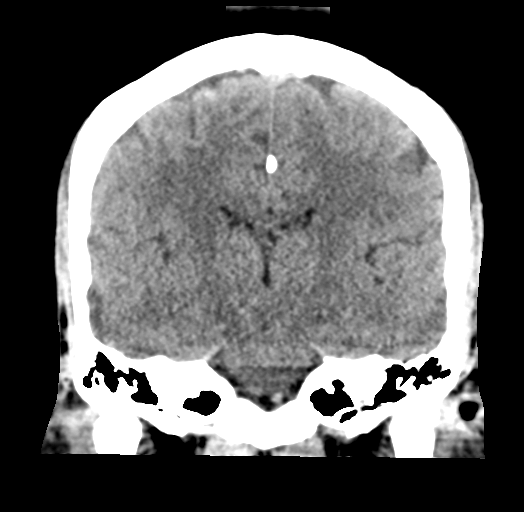

[Series 5: sagittal soft tissue · sagittal · 0.32mm/px · 3 of 50 slices shown]
[im 17/50  brain]
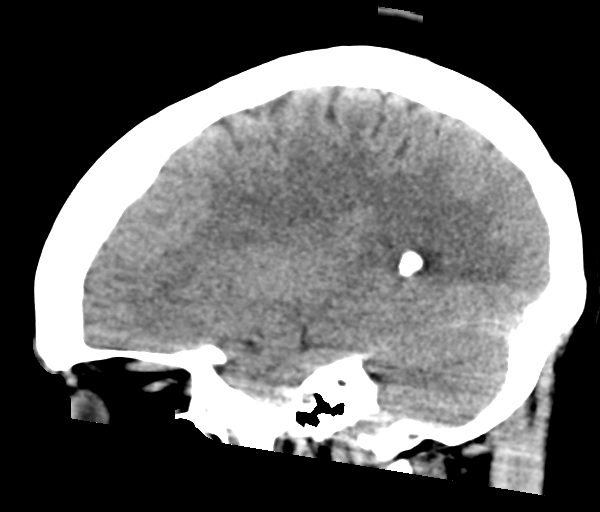
[im 25/50  brain]
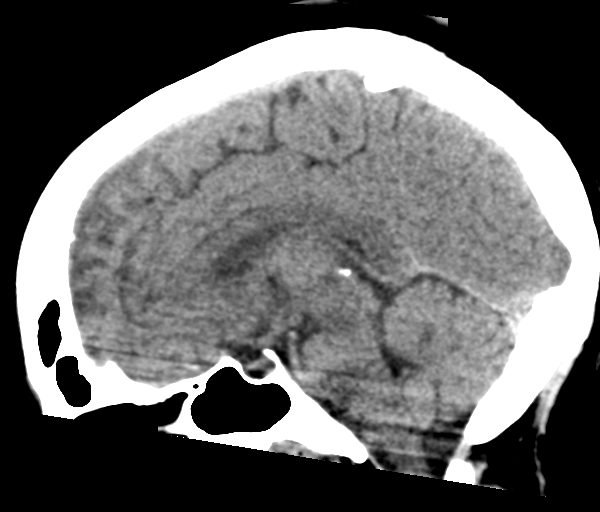
[im 33/50  brain]
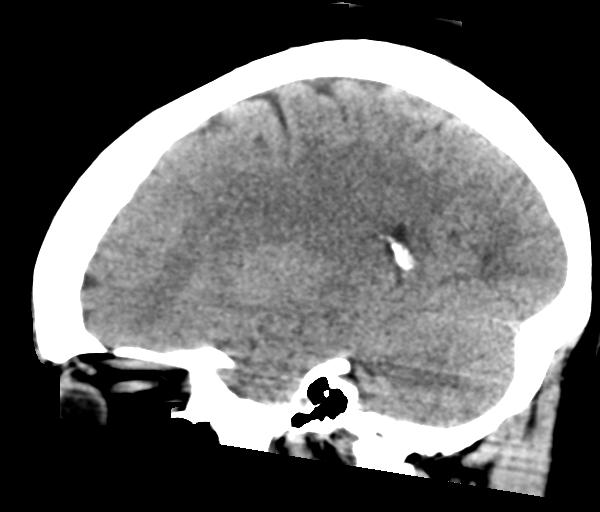

[16 of 47 positions shown; findings below may reference images not displayed]

FINDINGS: Brain: Normal cerebral volume. No midline shift, ventriculomegaly,
mass effect, evidence of mass lesion, intracranial hemorrhage or
evidence of cortically based acute infarction. Gray-white matter
differentiation is within normal limits throughout the brain.

Vascular: No suspicious intracranial vascular hyperdensity.

Skull: Negative.

Sinuses/Orbits: Visualized paranasal sinuses and mastoids are stable
and well pneumatized.

Other: Visualized orbit soft tissues are within normal limits. No
acute scalp soft tissue findings.

ASPECTS ([place] Stroke Program Early CT Score)

- Ganglionic level infarction (caudate, lentiform nuclei, internal
capsule, insula, M1-M3 cortex): 7

- Supraganglionic infarction (M4-M6 cortex): 3

Total score (0-10 with 10 being normal): 10
IMPRESSION: 1. Stable and normal noncontrast CT appearance of the brain.
2. ASPECTS is 10.
3. These results were communicated to Dr. JAIDER DAVID NARANJA at [DATE]
Thuy Linh 01/09/2018by text page via the [employer] messaging system.

## 2019-10-31 NOTE — Progress Notes (Signed)
Patient Care Team: Kristian Covey, MD as PCP - General (Obstetrics and Gynecology) Rico Junker, RN as Registered Nurse Theodore Demark, RN as Registered Nurse Jovita Kussmaul, MD as Consulting Physician (General Surgery) Nicholas Lose, MD as Consulting Physician (Hematology and Oncology) Kyung Rudd, MD as Consulting Physician (Radiation Oncology)  DIAGNOSIS:    ICD-10-CM   1. Malignant neoplasm of upper-outer quadrant of right breast in female, estrogen receptor positive ([place])  C50.411    Z17.0     SUMMARY OF ONCOLOGIC HISTORY: Oncology History  Malignant neoplasm of upper-outer quadrant of right breast in female, estrogen receptor positive ([place])  10/21/2018 Initial Diagnosis   21-monthfollow-up of breast calcifications showed calcifications increased in size, spanning 1.5cm, with no discrete mass. Biopsy on 10/21/18 showed invasive mammary carcinoma, grade 1, HER-2 negative (0), ER+ 51-90%, PR+ >90% (done at [facility]   11/10/2018 Cancer Staging   Staging form: Breast, AJCC 8th Edition - Clinical stage from 11/10/2018: Stage IA (cT1c, cN0, cM0, G1, ER+, PR+, HER2-)   11/25/2018 Genetic Testing   Negative genetic testing on a STAT panel.  The STAT Breast cancer panel offered by [facility] includes sequencing and rearrangement analysis for the following 9 genes:  ATM, BRCA1, BRCA2, CDH1, CHEK2, PALB2, PTEN, STK11 and TP53.   The report date is 11/18/2018.    SMARCA4 c.925C>A VUS identified on the multicancer panel.  The Multi-Gene Panel offered by [facility] includes sequencing and/or deletion duplication testing of the following 85 genes: AIP, ALK, APC, ATM, AXIN2,BAP1,  BARD1, BLM, BMPR1A, BRCA1, BRCA2, BRIP1, CASR, CDC73, CDH1, CDK4, CDKN1B, CDKN1C, CDKN2A (p14ARF), CDKN2A (p16INK4a), CEBPA, CHEK2, CTNNA1, DICER1, DIS3L2, EGFR (c.2369C>T, p.Thr790Met variant only), EPCAM (Deletion/duplication testing only), FH, FLCN, GATA2, GPC3, GREM1 (Promoter region deletion/duplication testing only), HOXB13  (c.251G>A, p.Gly84Glu), HRAS, KIT, MAX, MEN1, MET, MITF (c.952G>A, p.Glu318Lys variant only), MLH1, MSH2, MSH3, MSH6, MUTYH, NBN, NF1, NF2, NTHL1, PALB2, PDGFRA, PHOX2B, PMS2, POLD1, POLE, POT1, PRKAR1A, PTCH1, PTEN, RAD50, RAD51C, RAD51D, RB1, RECQL4, RET, RNF43, RUNX1, SDHAF2, SDHA (sequence changes only), SDHB, SDHC, SDHD, SMAD4, SMARCA4, SMARCB1, SMARCE1, STK11, SUFU, TERC, TERT, TMEM127, TP53, TSC1, TSC2, VHL, WRN and WT1.  The report date is November 25, 2018.   12/02/2018 Surgery   Right lumpectomy ([name] ([phone]: IDC with DCIS, grade 1, 1.4cm, 4 axillary lymph nodes negative   12/15/2018 Oncotype testing   Oncotype: score of 20, 6% of distant recurrence in 9 years with tamoxifen alone   01/28/2019 - 03/31/2019 Radiation Therapy   The patient initially received a dose of 50.4 Gy in 28 fractions to the breast using whole-breast tangent fields. This was delivered using a 3-D conformal technique. The patient then received a boost to the seroma. This delivered an additional 10 Gy in 5 fractions using a 3-field photon boost technique. The total dose was 60.4 Gy.   03/2019 - 03/2024 Anti-estrogen oral therapy   Tamoxifen     CHIEF COMPLIANT: Follow-up of right breast cancer on tamoxifen  INTERVAL HISTORY: Carol SOUTERis a 44y.o. with above-mentioned history of right breast cancer who underwent a lumpectomy, radiation, and is currently on antiestrogen therapy with tamoxifen. She presents to the clinic today for follow-up.  She does have a few adverse effects to tamoxifen therapy which include hot flashes fatigue and feeling of lack of energy and dullness in the mind.  ALLERGIES:  is allergic to shrimp [shellfish allergy], penicillins, and tomato.  MEDICATIONS:  Current Outpatient Medications  Medication Sig Dispense Refill  . aspirin 81  MG chewable tablet Chew 1 tablet (81 mg total) by mouth daily. (Patient not taking: Reported on 09/17/2019)    . chlorhexidine (PERIDEX) 0.12 % solution 15  mLs by Mouth Rinse route 2 (two) times daily.    Marland Kitchen ibuprofen (ADVIL,MOTRIN) 600 MG tablet Take 600 mg by mouth every 6 (six) hours as needed for headache or moderate pain.     . mupirocin ointment (BACTROBAN) 2 % Place 1 application into the nose 2 (two) times daily. (Patient not taking: Reported on 09/17/2019)    . nicotine (NICODERM CQ - DOSED IN MG/24 HR) 7 mg/24hr patch APPLY 1 PATCH TOPICALLY ONCE DAILY 28 patch 0  . tamoxifen (NOLVADEX) 20 MG tablet Take 1 tablet (20 mg total) by mouth daily. 90 tablet 3  . venlafaxine XR (EFFEXOR-XR) 37.5 MG 24 hr capsule Take 1 capsule (37.5 mg total) by mouth daily with breakfast. Increase to two tablets daily after 1 week (Patient not taking: Reported on 09/17/2019) 60 capsule 12  . vitamin E 100 UNIT capsule Take by mouth daily.     No current facility-administered medications for this visit.    PHYSICAL EXAMINATION: ECOG PERFORMANCE STATUS: 1 - Symptomatic but completely ambulatory  Vitals:   11/01/19 1111  BP: 126/90  Pulse: (!) 58  Resp: 17  Temp: (!) 97.1 F (36.2 C)  SpO2: 100%   Filed Weights   11/01/19 1111  Weight: 167 lb 1.6 oz (75.8 kg)    BREAST: No palpable masses or nodules in either right or left breasts. No palpable axillary supraclavicular or infraclavicular adenopathy no breast tenderness or nipple discharge. (exam performed in the presence of a chaperone)  LABORATORY DATA:  I have reviewed the data as listed CMP Latest Ref Rng & Units 12/13/2018 12/10/2018 03/31/2018  Glucose 70 - 99 mg/dL 96 101(H) 108(H)  BUN 6 - 20 mg/dL 11 11 9   Creatinine 0.44 - 1.00 mg/dL 0.78 0.78 1.09(H)  Sodium 135 - 145 mmol/L 137 140 138  Potassium 3.5 - 5.1 mmol/L 3.5 3.7 3.6  Chloride 98 - 111 mmol/L 104 107 105  CO2 22 - 32 mmol/L 25 25 27   Calcium 8.9 - 10.3 mg/dL 9.2 9.5 8.9  Total Protein 6.5 - 8.1 g/dL - - 7.3  Total Bilirubin 0.3 - 1.2 mg/dL - - 0.5  Alkaline Phos 38 - 126 U/L - - 78  AST 15 - 41 U/L - - 22  ALT 0 - 44 U/L - - 20      Lab Results  Component Value Date   WBC 5.7 12/13/2018   HGB 14.2 12/13/2018   HCT 40.9 12/13/2018   MCV 94.0 12/13/2018   PLT 253 12/13/2018   NEUTROABS 2.1 12/13/2018    ASSESSMENT & PLAN:  Malignant neoplasm of upper-outer quadrant of right breast in female, estrogen receptor positive ([place]) 12/02/2018:Right lumpectomy [name]): IDC with DCIS, grade 1, 1.4cm, 4 axillary lymph nodes negative HER-2 negative (0), ER+ 51-90%, PR+ >90% (done at [facility]) Oncotype: score of 20, 6% of distant recurrence in 9 years with tamoxifen alone (did not recommend chemotherapy) Adj XRT 01/29/19  Current treatment: Adjuvant antiestrogen therapy with tamoxifen 20 mg daily to be started in 2 weeks.  Plan is for 10 years  Tamoxifen toxicities:  Complains of hot flashes Fatigue Lack of taste  I discussed with her that the symptoms do get better with time.  She informed me that they are better than before. Mood swings: We previously sent for Effexor but she has not  received it so I sent another prescription today.  She will take 37.5 mg once daily.  Radiation Dermatitis: Mild-mod  Return to clinic in 1 year for follow-up    No orders of the defined types were placed in this encounter.  The patient has a good understanding of the overall plan. she agrees with it. she will call with any problems that may develop before the next visit here.  Total time spent: 20 mins including face to face time and time spent for planning, charting and coordination of care  Nicholas Lose, MD 11/01/2019  I, Cloyde Reams Dorshimer, am acting as scribe for Dr. Nicholas Lose.  I have reviewed the above documentation for accuracy and completeness, and I agree with the above.

## 2019-11-01 ENCOUNTER — Other Ambulatory Visit: Payer: Self-pay

## 2019-11-01 ENCOUNTER — Inpatient Hospital Stay: Payer: Medicaid Other | Attending: Adult Health | Admitting: Hematology and Oncology

## 2019-11-01 DIAGNOSIS — Z79899 Other long term (current) drug therapy: Secondary | ICD-10-CM | POA: Insufficient documentation

## 2019-11-01 DIAGNOSIS — Z923 Personal history of irradiation: Secondary | ICD-10-CM | POA: Diagnosis not present

## 2019-11-01 DIAGNOSIS — C50411 Malignant neoplasm of upper-outer quadrant of right female breast: Secondary | ICD-10-CM | POA: Insufficient documentation

## 2019-11-01 DIAGNOSIS — N951 Menopausal and female climacteric states: Secondary | ICD-10-CM | POA: Diagnosis not present

## 2019-11-01 DIAGNOSIS — Z170 Estrogen receptor positive status [ER+]: Secondary | ICD-10-CM | POA: Insufficient documentation

## 2019-11-01 DIAGNOSIS — L598 Other specified disorders of the skin and subcutaneous tissue related to radiation: Secondary | ICD-10-CM | POA: Insufficient documentation

## 2019-11-01 MED ORDER — TAMOXIFEN CITRATE 20 MG PO TABS: 20.0000 mg | ORAL_TABLET | Freq: Every day | ORAL | 3 refills | Status: DC

## 2019-11-01 MED ORDER — VENLAFAXINE HCL ER 37.5 MG PO CP24: 37.5000 mg | ORAL_CAPSULE | Freq: Every day | ORAL | 12 refills | Status: DC

## 2019-11-01 NOTE — Assessment & Plan Note (Signed)
12/02/2018:Right lumpectomy [name]): IDC with DCIS, grade 1, 1.4cm, 4 axillary lymph nodes negative HER-2 negative (0), ER+ 51-90%, PR+ >90% (done at [facility]) Oncotype: score of 20, 6% of distant recurrence in 9 years with tamoxifen alone (did not recommend chemotherapy) Adj XRT 01/29/19  Current treatment: Adjuvant antiestrogen therapy with tamoxifen 20 mg daily to be started in 2 weeks.  Plan is for 10 years  Tamoxifen toxicities:   Radiation Dermatitis: Mild-mod  Return to clinic in 1 year for follow-up

## 2019-11-03 IMAGING — US US CAROTID DUPLEX BILAT
1 series · 13 of 24 positions shown · non-contrast
Comparison: None.

CLINICAL DATA: 41-year-old female with a history of TIA.

EXAM:
BILATERAL CAROTID DUPLEX ULTRASOUND
TECHNIQUE: Gray scale imaging, color Doppler and duplex ultrasound were
performed of bilateral carotid and vertebral arteries in the neck.

[Series 1: us carotid duplex bilat · 13 of 64 slices shown]
[im 1/64]
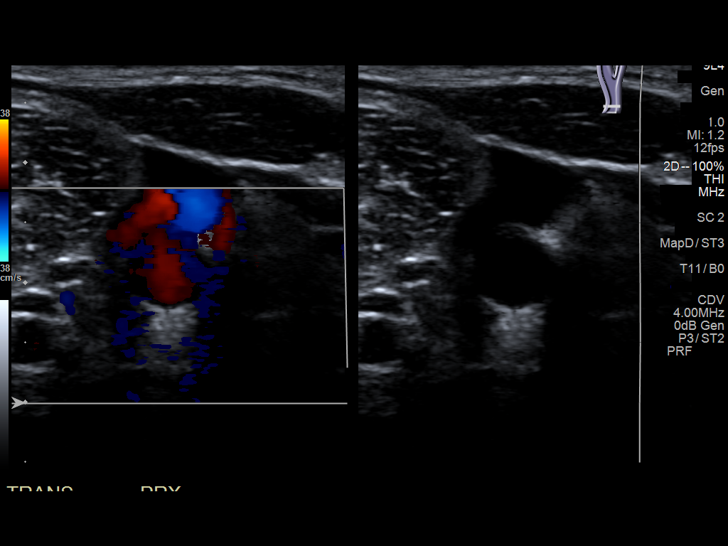
[im 6/64]
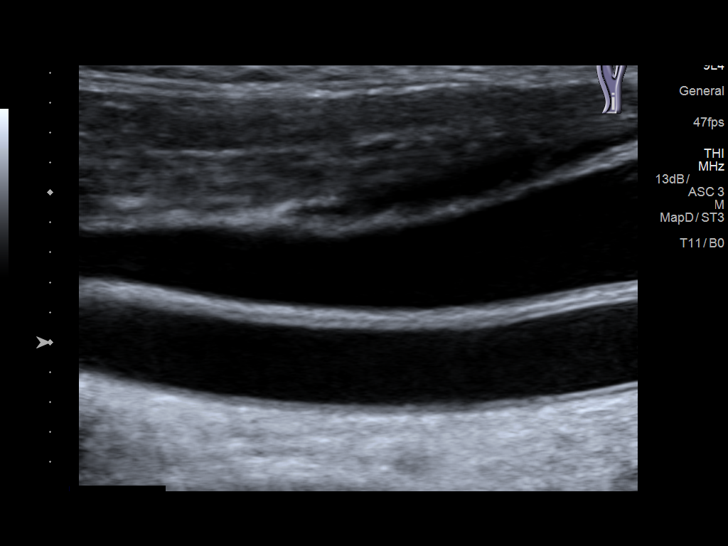
[im 11/64]
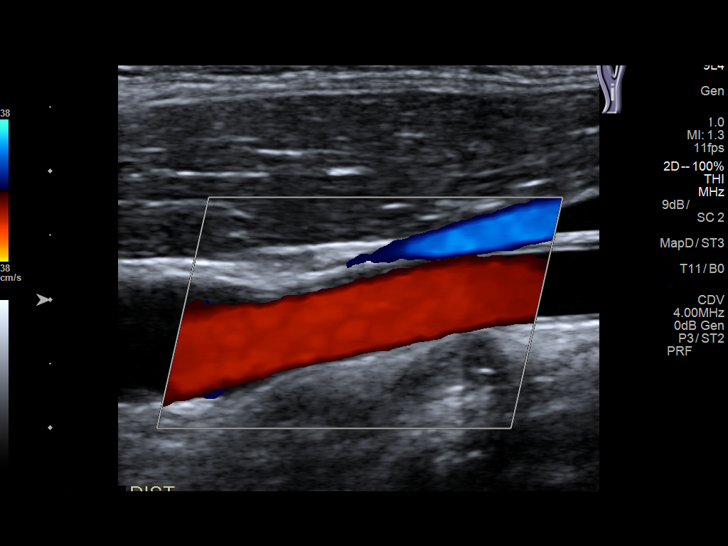
[im 17/64]
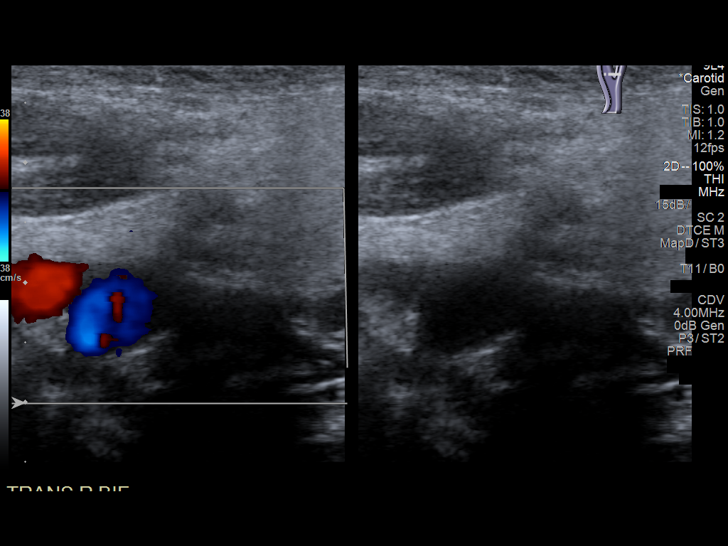
[im 22/64]
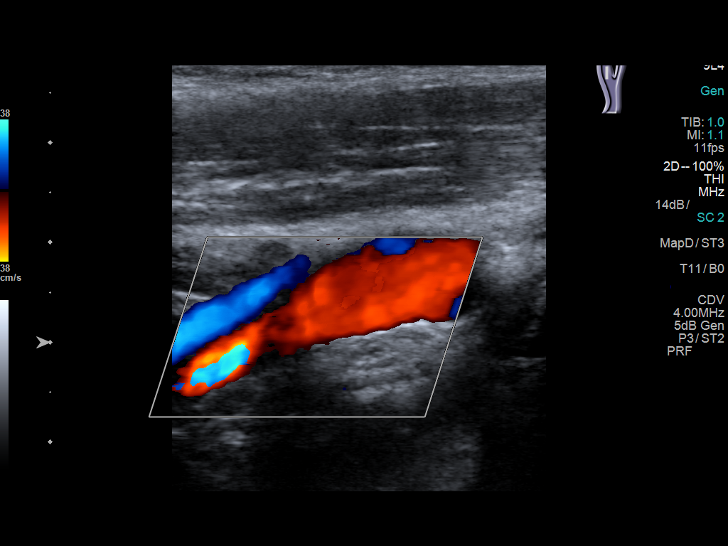
[im 28/64]
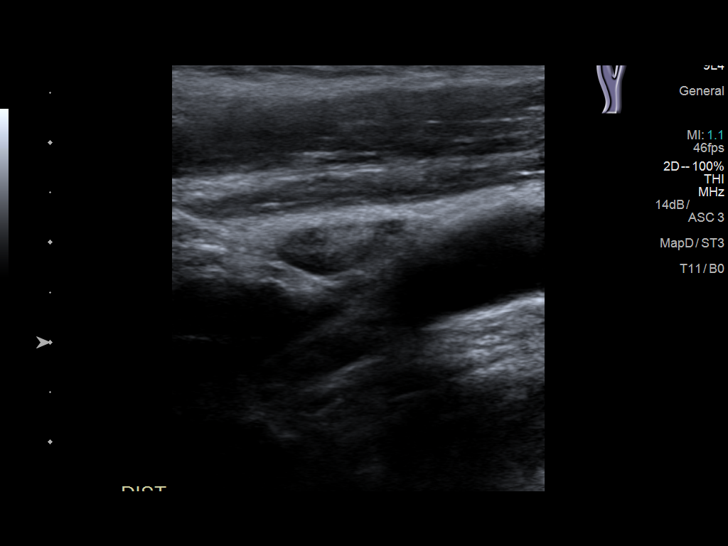
[im 33/64]
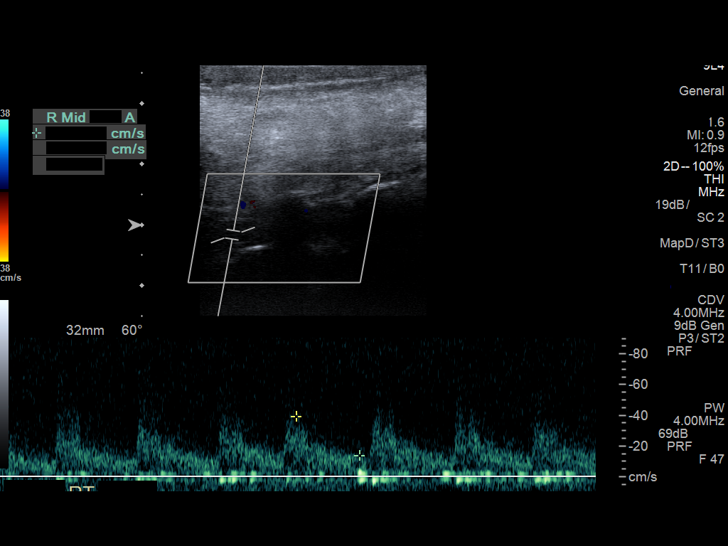
[im 36/64]
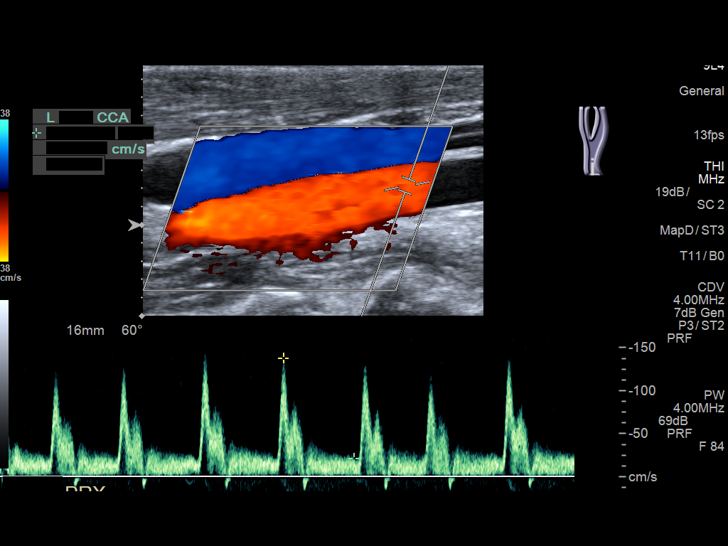
[im 42/64]
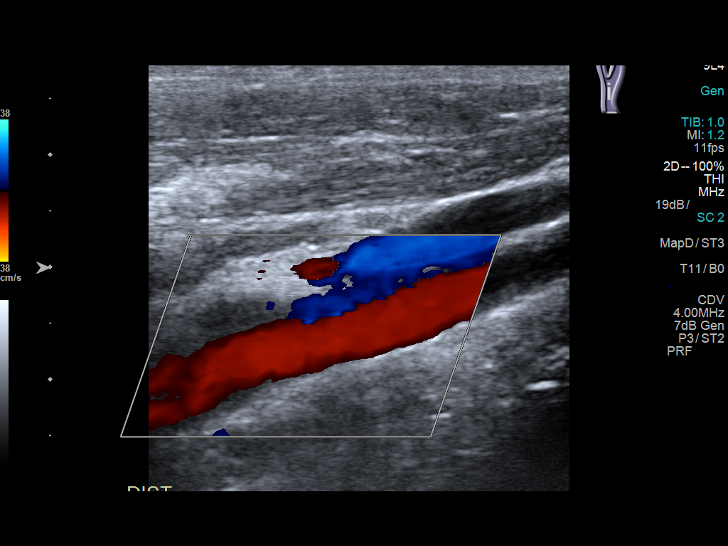
[im 47/64]
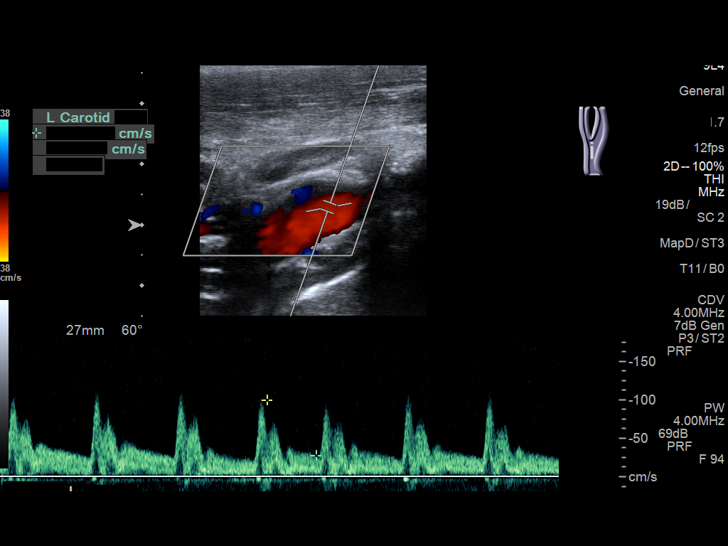
[im 53/64]
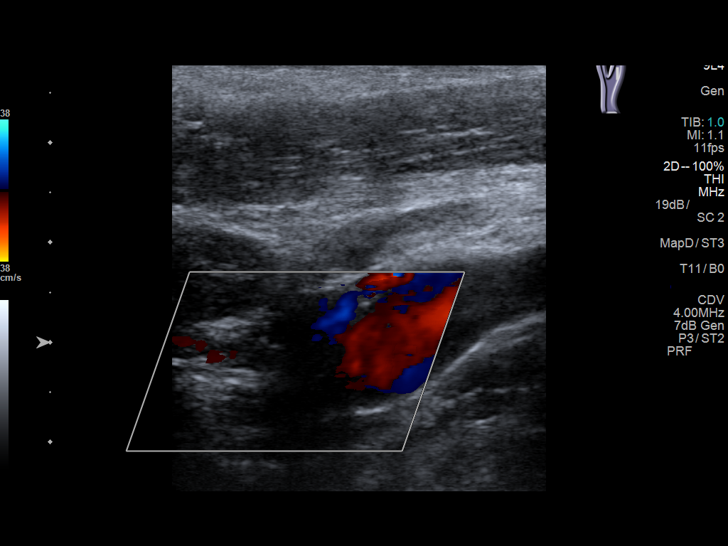
[im 58/64]
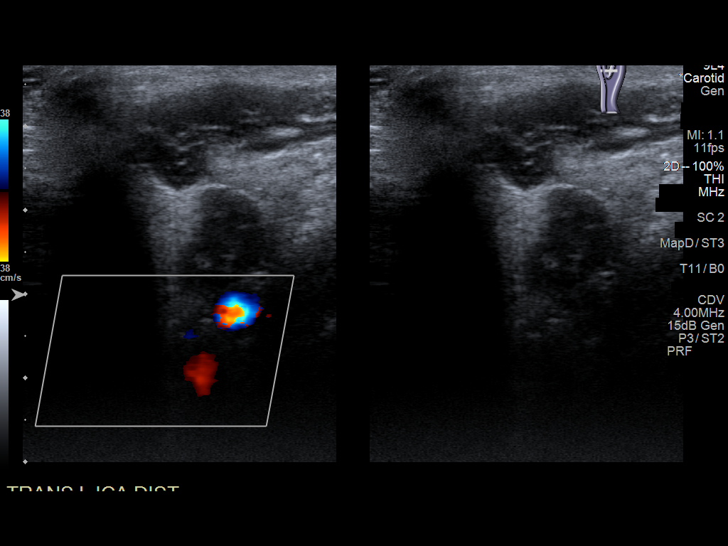
[im 64/64]
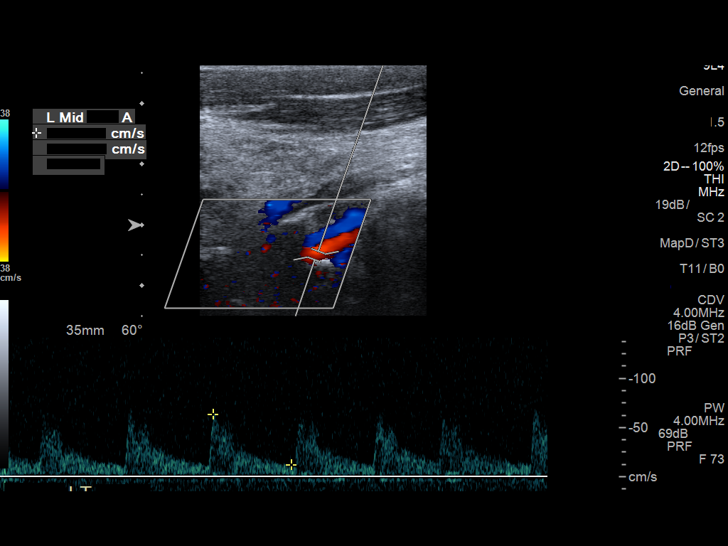

[13 of 24 positions shown; findings below may reference images not displayed]

FINDINGS: Criteria: Quantification of carotid stenosis is based on velocity
parameters that correlate the residual internal carotid diameter
with NASCET-based stenosis levels, using the diameter of the distal
internal carotid lumen as the denominator for stenosis measurement.

The following velocity measurements were obtained:

RIGHT

ICA:  Systolic 105 cm/sec, Diastolic 27 cm/sec

CCA:  116 cm/sec

SYSTOLIC ICA/CCA RATIO:

ECA:  124 cm/sec

LEFT

ICA:  Systolic 83 cm/sec, Diastolic 37 cm/sec

CCA:  161 cm/sec

SYSTOLIC ICA/CCA RATIO:

ECA:  126 cm/sec

Right Brachial SBP: Not acquired

Left Brachial SBP: Not acquired

RIGHT CAROTID ARTERY: No significant calcifications of the right
common carotid artery. Intermediate waveform maintained.
Heterogeneous and partially calcified plaque at the right carotid
bifurcation. No significant lumen shadowing. Low resistance waveform
of the right ICA. No significant tortuosity.

RIGHT VERTEBRAL ARTERY: Antegrade flow with low resistance waveform.

LEFT CAROTID ARTERY: No significant calcifications of the left
common carotid artery. Intermediate waveform maintained.
Heterogeneous and partially calcified plaque at the left carotid
bifurcation without significant lumen shadowing. Low resistance
waveform of the left ICA. No significant tortuosity.

LEFT VERTEBRAL ARTERY:  Antegrade flow with low resistance waveform.
IMPRESSION: Color duplex indicates minimal heterogeneous and calcified plaque,
with no hemodynamically significant stenosis by duplex criteria in
the extracranial cerebrovascular circulation.

## 2019-12-27 ENCOUNTER — Other Ambulatory Visit: Payer: Self-pay

## 2019-12-27 ENCOUNTER — Ambulatory Visit
Admission: RE | Admit: 2019-12-27 | Discharge: 2019-12-27 | Disposition: A | Discharge status: Home / Self Care | Payer: Medicaid Other | Source: Ambulatory Visit | Attending: Adult Health | Admitting: Adult Health

## 2019-12-27 DIAGNOSIS — Z170 Estrogen receptor positive status [ER+]: Secondary | ICD-10-CM

## 2020-04-06 ENCOUNTER — Encounter: Payer: Self-pay | Admitting: Podiatry

## 2020-04-06 ENCOUNTER — Other Ambulatory Visit: Payer: Self-pay | Admitting: Podiatry

## 2020-04-06 ENCOUNTER — Other Ambulatory Visit: Payer: Self-pay

## 2020-04-10 ENCOUNTER — Other Ambulatory Visit: Payer: Self-pay

## 2020-04-10 ENCOUNTER — Other Ambulatory Visit
Admission: RE | Admit: 2020-04-10 | Discharge: 2020-04-10 | Disposition: A | Discharge status: Home / Self Care | Payer: Medicare HMO | Source: Ambulatory Visit | Attending: Podiatry | Admitting: Podiatry

## 2020-04-10 DIAGNOSIS — Z20822 Contact with and (suspected) exposure to covid-19: Secondary | ICD-10-CM | POA: Insufficient documentation

## 2020-04-10 DIAGNOSIS — Z01812 Encounter for preprocedural laboratory examination: Secondary | ICD-10-CM | POA: Diagnosis present

## 2020-04-10 LAB — SARS CORONAVIRUS 2 (TAT 6-24 HRS): SARS Coronavirus 2: NEGATIVE

## 2020-04-11 NOTE — Discharge Instructions (Signed)
[facility]  POST OPERATIVE INSTRUCTIONS FOR DR. TROXLER, DR. FOWLER, AND DR. [facility]   1. Take your medication as prescribed.  Pain medication should be taken only as needed.  2. Keep the dressing clean, dry and intact.  3. Keep your foot elevated above the heart level for the first 48 hours.  4. Walking to the bathroom and brief periods of walking are acceptable, unless we have instructed you to be non-weight bearing.  5. Always wear your post-op shoe when walking.  Always use your crutches if you are to be non-weight bearing.  6. Do not take a shower. Baths are permissible as long as the foot is kept out of the water.   7. Every hour you are awake:  - Bend your knee 15 times. - Flex foot 15 times - Massage calf 15 times  8. Call [facility] ([phone]) if any of the following problems occur: - You develop a temperature or fever. - The bandage becomes saturated with blood. - Medication does not stop your pain. - Injury of the foot occurs. - Any symptoms of infection including redness, odor, or red streaks running from wound.   General Anesthesia, Adult, Care After This sheet gives you information about how to care for yourself after your procedure. Your health care provider may also give you more specific instructions. If you have problems or questions, contact your health care provider. What can I expect after the procedure? After the procedure, the following side effects are common:  Pain or discomfort at the IV site.  Nausea.  Vomiting.  Sore throat.  Trouble concentrating.  Feeling cold or chills.  Feeling weak or tired.  Sleepiness and fatigue.  Soreness and body aches. These side effects can affect parts of the body that were not involved in surgery. Follow these instructions at home: For the time period you were told by your health care provider:  Rest.  Do not  participate in activities where you could fall or become injured.  Do not drive or use machinery.  Do not drink alcohol.  Do not take sleeping pills or medicines that cause drowsiness.  Do not make important decisions or sign legal documents.  Do not take care of children on your own.   Eating and drinking  Follow any instructions from your health care provider about eating or drinking restrictions.  When you feel hungry, start by eating small amounts of foods that are soft and easy to digest (bland), such as toast. Gradually return to your regular diet.  Drink enough fluid to keep your urine pale yellow.  If you vomit, rehydrate by drinking water, juice, or clear broth. General instructions  If you have sleep apnea, surgery and certain medicines can increase your risk for breathing problems. Follow instructions from your health care provider about wearing your sleep device: ? Anytime you are sleeping, including during daytime naps. ? While taking prescription pain medicines, sleeping medicines, or medicines that make you drowsy.  Have a responsible adult stay with you for the time you are told. It is important to have someone help care for you until you are awake and alert.  Return to your normal activities as told by your health care provider. Ask your health care provider what activities are safe for you.  Take over-the-counter and prescription medicines only as told by your health care provider.  If you smoke, do not smoke without supervision.  Keep all follow-up visits   as told by your health care provider. This is important. Contact a health care provider if:  You have nausea or vomiting that does not get better with medicine.  You cannot eat or drink without vomiting.  You have pain that does not get better with medicine.  You are unable to pass urine.  You develop a skin rash.  You have a fever.  You have redness around your IV site that gets worse. Get help  right away if:  You have difficulty breathing.  You have chest pain.  You have blood in your urine or stool, or you vomit blood. Summary  After the procedure, it is common to have a sore throat or nausea. It is also common to feel tired.  Have a responsible adult stay with you for the time you are told. It is important to have someone help care for you until you are awake and alert.  When you feel hungry, start by eating small amounts of foods that are soft and easy to digest (bland), such as toast. Gradually return to your regular diet.  Drink enough fluid to keep your urine pale yellow.  Return to your normal activities as told by your health care provider. Ask your health care provider what activities are safe for you. This information is not intended to replace advice given to you by your health care provider. Make sure you discuss any questions you have with your health care provider. Document Revised: 11/25/2019 Document Reviewed: 06/24/2019 Elsevier Patient Education  2021 [employer]vier Inc.  

## 2020-04-12 ENCOUNTER — Ambulatory Visit: Payer: Medicare HMO | Admitting: Anesthesiology

## 2020-04-12 ENCOUNTER — Other Ambulatory Visit: Payer: Self-pay

## 2020-04-12 ENCOUNTER — Ambulatory Visit
Admission: RE | Admit: 2020-04-12 | Discharge: 2020-04-12 | Disposition: A | Discharge status: Home / Self Care | Payer: Medicare HMO | Attending: Podiatry | Admitting: Podiatry

## 2020-04-12 ENCOUNTER — Encounter: Payer: Self-pay | Admitting: Podiatry

## 2020-04-12 ENCOUNTER — Encounter
Admission: RE | Disposition: A | Discharge status: Home / Self Care | Payer: Self-pay | Source: Home / Self Care | Attending: Podiatry

## 2020-04-12 DIAGNOSIS — Z79810 Long term (current) use of selective estrogen receptor modulators (SERMs): Secondary | ICD-10-CM | POA: Diagnosis not present

## 2020-04-12 DIAGNOSIS — F17200 Nicotine dependence, unspecified, uncomplicated: Secondary | ICD-10-CM | POA: Diagnosis not present

## 2020-04-12 DIAGNOSIS — C50911 Malignant neoplasm of unspecified site of right female breast: Secondary | ICD-10-CM | POA: Insufficient documentation

## 2020-04-12 DIAGNOSIS — Z880 Allergy status to penicillin: Secondary | ICD-10-CM | POA: Insufficient documentation

## 2020-04-12 DIAGNOSIS — L02612 Cutaneous abscess of left foot: Secondary | ICD-10-CM | POA: Insufficient documentation

## 2020-04-12 DIAGNOSIS — Z853 Personal history of malignant neoplasm of breast: Secondary | ICD-10-CM | POA: Insufficient documentation

## 2020-04-12 HISTORY — PX: INCISION AND DRAINAGE: SHX5863

## 2020-04-12 LAB — POCT PREGNANCY, URINE: Preg Test, Ur: NEGATIVE

## 2020-04-12 SURGERY — INCISION AND DRAINAGE
Anesthesia: General | Site: Foot | Laterality: Left

## 2020-04-12 MED ORDER — CLINDAMYCIN PHOSPHATE 900 MG/50ML IV SOLN
900.0000 mg | INTRAVENOUS | Status: AC
  Administered 2020-04-12: 900 mg via INTRAVENOUS

## 2020-04-12 MED ORDER — PROPOFOL 10 MG/ML IV BOLUS
INTRAVENOUS | Status: DC | PRN
  Administered 2020-04-12: 200 mg via INTRAVENOUS

## 2020-04-12 MED ORDER — HYDROCODONE-ACETAMINOPHEN 5-325 MG PO TABS: 1.0000 | ORAL_TABLET | Freq: Four times a day (QID) | ORAL | 0 refills | Status: DC | PRN

## 2020-04-12 MED ORDER — SODIUM CHLORIDE (PF) 0.9 % IJ SOLN
INTRAMUSCULAR | Status: DC | PRN
  Administered 2020-04-12: 9 mL

## 2020-04-12 MED ORDER — ONDANSETRON HCL 4 MG/2ML IJ SOLN
INTRAMUSCULAR | Status: DC | PRN
  Administered 2020-04-12: 4 mg via INTRAVENOUS

## 2020-04-12 MED ORDER — GLYCOPYRROLATE 0.2 MG/ML IJ SOLN
INTRAMUSCULAR | Status: DC | PRN
  Administered 2020-04-12: .1 mg via INTRAVENOUS

## 2020-04-12 MED ORDER — MIDAZOLAM HCL 5 MG/5ML IJ SOLN
INTRAMUSCULAR | Status: DC | PRN
  Administered 2020-04-12: 2 mg via INTRAVENOUS

## 2020-04-12 MED ORDER — LIDOCAINE HCL (CARDIAC) PF 100 MG/5ML IV SOSY
PREFILLED_SYRINGE | INTRAVENOUS | Status: DC | PRN
  Administered 2020-04-12: 40 mg via INTRATRACHEAL

## 2020-04-12 MED ORDER — DEXAMETHASONE SODIUM PHOSPHATE 4 MG/ML IJ SOLN
INTRAMUSCULAR | Status: DC | PRN
  Administered 2020-04-12: 4 mg via INTRAVENOUS

## 2020-04-12 MED ORDER — POVIDONE-IODINE 7.5 % EX SOLN: Freq: Once | CUTANEOUS | Status: DC

## 2020-04-12 MED ORDER — LACTATED RINGERS IV SOLN: INTRAVENOUS | Status: DC

## 2020-04-12 MED ORDER — FENTANYL CITRATE (PF) 100 MCG/2ML IJ SOLN
INTRAMUSCULAR | Status: DC | PRN
  Administered 2020-04-12: 50 ug via INTRAVENOUS

## 2020-04-12 SURGICAL SUPPLY — 23 items
BNDG CMPR 75X41 PLY HI ABS (GAUZE/BANDAGES/DRESSINGS) ×1
BNDG COHESIVE 4X5 TAN STRL (GAUZE/BANDAGES/DRESSINGS) ×1 IMPLANT
BNDG ESMARK 4X12 TAN STRL LF (GAUZE/BANDAGES/DRESSINGS) ×2 IMPLANT
BNDG GAUZE 4.5X4.1 6PLY STRL (MISCELLANEOUS) ×2 IMPLANT
BNDG STRETCH 4X75 STRL LF (GAUZE/BANDAGES/DRESSINGS) ×2 IMPLANT
DURAPREP 26ML APPLICATOR (WOUND CARE) ×2 IMPLANT
ELECT REM PT RETURN 9FT ADLT (ELECTROSURGICAL) ×2
ELECTRODE REM PT RTRN 9FT ADLT (ELECTROSURGICAL) ×1 IMPLANT
GAUZE SPONGE 4X4 12PLY STRL (GAUZE/BANDAGES/DRESSINGS) ×2 IMPLANT
GAUZE XEROFORM 1X8 LF (GAUZE/BANDAGES/DRESSINGS) ×2 IMPLANT
GLOVE BIO SURGEON STRL SZ8 (GLOVE) ×3 IMPLANT
GLOVE INDICATOR 7.5 STRL GRN (GLOVE) ×2 IMPLANT
GOWN STRL REUS W/ TWL LRG LVL3 (GOWN DISPOSABLE) ×1 IMPLANT
GOWN STRL REUS W/TWL LRG LVL3 (GOWN DISPOSABLE) ×4
KIT TURNOVER KIT A (KITS) ×2 IMPLANT
NDL HYPO 25GX1X1/2 BEV (NEEDLE) ×1 IMPLANT
NEEDLE HYPO 25GX1X1/2 BEV (NEEDLE) ×2 IMPLANT
NS IRRIG 500ML POUR BTL (IV SOLUTION) ×2 IMPLANT
PACK EXTREMITY ARMC (MISCELLANEOUS) ×2 IMPLANT
PENCIL SMOKE EVACUATOR (MISCELLANEOUS) ×2 IMPLANT
SOL PREP PROV IODINE SCRUB 4OZ (MISCELLANEOUS) ×1 IMPLANT
STOCKINETTE IMPERVIOUS LG (DRAPES) ×1 IMPLANT
SYR 10ML LL (SYRINGE) ×2 IMPLANT

## 2020-04-12 NOTE — Op Note (Signed)
Operative note   Surgeon:Zaidan Keeble Lawyer: None    Preop diagnosis: Abscess left foot    Postop diagnosis: Same    Procedure: I&D abscess multiple site left foot    EBL: Minimal    Anesthesia:local and general    Hemostasis: Epinephrine infiltrated along the incision site    Specimen: Infected soft tissue for pathology and wound culture    Complications: None    Operative indications:Carol Crawford is an 45 y.o. that presents today for surgical intervention.  The risks/benefits/alternatives/complications have been discussed and consent has been given.    Procedure:  Patient was brought into the OR and placed on the operating table in thesupine position. After anesthesia was obtained theleft lower extremity was prepped and draped in usual sterile fashion.  Attention was initially directed to the medial aspect of the midfoot where a medial incision was performed.  This was at the site of an open draining abscessed area.  Sharp and blunt dissection was carried down into the subcutaneous tissue.  The dissection revealed some scar tissue with a sinus tract and I was able to remove the sinus tract tissue and sent this for pathological examination.  In the deeper subcutaneous tissue level there was loose Ethibond material and I removed all of this.  Further dissection did not show any further nonabsorbable suture material.  Inspected the entire remaining area for no further infectious process.  This wound was flushed with copious amounts of irrigation.  Closure was performed with a 3-0 nylon.  Attention was then directed to the dorsal aspect of the midfoot where a small draining abscess was noted.  Longitudinal incision was performed.  Sharp and blunt dissection was carried down through the epidermis and dermal layer into the subcutaneous tissue.  Small amounts of nonabsorbable suture was noted along this area and excised.  The wound was flushed with copious amounts of irrigation.   This incision was also closed with a nylon.    Patient tolerated the procedure and anesthesia well.  Was transported from the OR to the PACU with all vital signs stable and vascular status intact. To be discharged per routine protocol.  Will follow up in approximately 1 week in the outpatient clinic.

## 2020-04-12 NOTE — Anesthesia Postprocedure Evaluation (Signed)
Anesthesia Post Note  Patient: Carol Crawford  Procedure(s) Performed: INCISION AND DRAINAGE BELOW FASCIA FOOT, SINGLE LEFT (Left Foot)     Patient location during evaluation: PACU Anesthesia Type: General Level of consciousness: awake and alert Pain management: pain level controlled Vital Signs Assessment: post-procedure vital signs reviewed and stable Respiratory status: spontaneous breathing, nonlabored ventilation, respiratory function stable and patient connected to nasal cannula oxygen Cardiovascular status: blood pressure returned to baseline and stable Postop Assessment: no apparent nausea or vomiting Anesthetic complications: no   No complications documented.  Alisa Graff

## 2020-04-12 NOTE — Anesthesia Preprocedure Evaluation (Signed)
Anesthesia Evaluation  Patient identified by MRN, date of birth, ID band Patient awake    Reviewed: Allergy & Precautions, H&P , NPO status , Patient's Chart, lab work & pertinent test results, reviewed documented beta blocker date and time   History of Anesthesia Complications (+) history of anesthetic complications (after foot surgery, L side was numb - fully recovered)  Airway Mallampati: II  TM Distance: >3 FB Neck ROM: full    Dental no notable dental hx.    Pulmonary neg pulmonary ROS, Current Smoker and Patient abstained from smoking.,    Pulmonary exam normal breath sounds clear to auscultation       Cardiovascular Exercise Tolerance: Good negative cardio ROS   Rhythm:regular Rate:Normal     Neuro/Psych  Headaches, Bipolar Disorder Schizophrenia    GI/Hepatic Neg liver ROS, GERD  ,  Endo/Other  negative endocrine ROS  Renal/GU negative Renal ROS   H/o R breast ca negative genitourinary   Musculoskeletal   Abdominal   Peds  Hematology negative hematology ROS (+)   Anesthesia Other Findings   Reproductive/Obstetrics negative OB ROS                             Anesthesia Physical Anesthesia Plan  ASA: III  Anesthesia Plan: General   Post-op Pain Management:    Induction:   PONV Risk Score and Plan: 2 and Dexamethasone and Ondansetron  Airway Management Planned:   Additional Equipment:   Intra-op Plan:   Post-operative Plan:   Informed Consent: I have reviewed the patients History and Physical, chart, labs and discussed the procedure including the risks, benefits and alternatives for the proposed anesthesia with the patient or authorized representative who has indicated his/her understanding and acceptance.     Dental Advisory Given  Plan Discussed with: CRNA  Anesthesia Plan Comments:         Anesthesia Quick Evaluation

## 2020-04-12 NOTE — Transfer of Care (Signed)
Immediate Anesthesia Transfer of Care Note  Patient: Carol Crawford  Procedure(s) Performed: INCISION AND DRAINAGE BELOW FASCIA FOOT, SINGLE LEFT (Left Foot)  Patient Location: PACU  Anesthesia Type: General  Level of Consciousness: awake, alert  and patient cooperative  Airway and Oxygen Therapy: Patient Spontanous Breathing and Patient connected to supplemental oxygen  Post-op Assessment: Post-op Vital signs reviewed, Patient's Cardiovascular Status Stable, Respiratory Function Stable, Patent Airway and No signs of Nausea or vomiting  Post-op Vital Signs: Reviewed and stable  Complications: No complications documented.

## 2020-04-12 NOTE — H&P (Signed)
HISTORY AND PHYSICAL INTERVAL NOTE:  04/12/2020  11:50 AM  Carol Crawford  has presented today for surgery, with the diagnosis of L03.116 CELLULITIS LEFT FOOT L02.612 ABSCESS LEFT FOOT.  The various methods of treatment have been discussed with the patient.  No guarantees were given.  After consideration of risks, benefits and other options for treatment, the patient has consented to surgery.  I have reviewed the patients' chart and labs.     A history and physical examination was performed in my office.  The patient was reexamined.  There have been no changes to this history and physical examination.  Carol Crawford A

## 2020-04-12 NOTE — Anesthesia Procedure Notes (Signed)
Procedure Name: LMA Insertion Date/Time: 04/12/2020 12:07 PM Performed by: Mayme Genta, CRNA Pre-anesthesia Checklist: Patient identified, Emergency Drugs available, Suction available, Timeout performed and Patient being monitored Patient Re-evaluated:Patient Re-evaluated prior to induction Oxygen Delivery Method: Circle system utilized Preoxygenation: Pre-oxygenation with 100% oxygen Induction Type: IV induction LMA: LMA inserted LMA Size: 4.0 Number of attempts: 1 Placement Confirmation: positive ETCO2 and breath sounds checked- equal and bilateral Tube secured with: Tape

## 2020-04-13 ENCOUNTER — Encounter: Payer: Self-pay | Admitting: Podiatry

## 2020-04-14 LAB — SURGICAL PATHOLOGY

## 2020-04-18 LAB — AEROBIC/ANAEROBIC CULTURE W GRAM STAIN (SURGICAL/DEEP WOUND): Gram Stain: NONE SEEN

## 2020-05-01 ENCOUNTER — Other Ambulatory Visit: Payer: Self-pay | Admitting: *Deleted

## 2020-05-01 MED ORDER — TAMOXIFEN CITRATE 20 MG PO TABS: 20.0000 mg | ORAL_TABLET | Freq: Every day | ORAL | 3 refills | Status: DC

## 2020-07-04 ENCOUNTER — Ambulatory Visit: Payer: Medicare HMO

## 2020-07-14 ENCOUNTER — Ambulatory Visit: Payer: Medicare HMO

## 2020-07-26 ENCOUNTER — Other Ambulatory Visit: Payer: Self-pay

## 2020-07-26 ENCOUNTER — Ambulatory Visit (LOCAL_COMMUNITY_HEALTH_CENTER): Payer: Medicare HMO | Admitting: Advanced Practice Midwife

## 2020-07-26 VITALS — BP 105/72 | Ht 67.0 in | Wt 165.8 lb

## 2020-07-26 DIAGNOSIS — Z5321 Procedure and treatment not carried out due to patient leaving prior to being seen by health care provider: Secondary | ICD-10-CM

## 2020-07-26 NOTE — Progress Notes (Signed)
45 yo [id number] dx'd with +estrogen receptor breast cancer on 09/2018 followed by radiation and Tamoxifen here for birth control. Last pap 08/06/2017 neg HPV neg Last physical 09/17/19 when pt asked for birth control and I counseled her to discuss safe options with her onocologist, who she said she would see 10/2019.  I had informed her that decision for birth control needed to be made by her oncologist who knows if she can even take estrogen or progesterone based on her tumor markers, because we did not want to give her something that would cause a recurrence of her breast cancer. At that apt pt said she understood, but she is back telling RN her next oncology apt is 10/2020. This provider went to talk with pt in exam room and she had left building

## 2020-07-26 NOTE — Progress Notes (Signed)
Pt stated that she was here for a pap and PE, however, she was scheduled as an acute visit.  I informed the patient that her PE couldn't be done until June and her pap was due in 2024.  Pt stated that the only other thing she wanted was birth control.  I placed the patient in an exam room and told her that I would speak to the Provider.  Pt stated "If you can't do anything, I don't have time to sit and wait."  I spoke to Donnal Moat  and she said that she would speak with the patient.  I went back to the room, to inform the patient that Benjamine Mola would be in to talk to her, but the patient had left.

## 2020-08-28 ENCOUNTER — Encounter: Payer: Self-pay | Admitting: Emergency Medicine

## 2020-08-28 ENCOUNTER — Inpatient Hospital Stay
Admission: EM | Admit: 2020-08-28 | Discharge: 2020-08-31 | DRG: 869 | Disposition: A | Discharge status: Home / Self Care | Payer: Medicare HMO | Attending: Internal Medicine | Admitting: Internal Medicine

## 2020-08-28 ENCOUNTER — Other Ambulatory Visit: Payer: Self-pay

## 2020-08-28 DIAGNOSIS — Z9152 Personal history of nonsuicidal self-harm: Secondary | ICD-10-CM

## 2020-08-28 DIAGNOSIS — Z923 Personal history of irradiation: Secondary | ICD-10-CM

## 2020-08-28 DIAGNOSIS — Z880 Allergy status to penicillin: Secondary | ICD-10-CM | POA: Diagnosis not present

## 2020-08-28 DIAGNOSIS — Z170 Estrogen receptor positive status [ER+]: Secondary | ICD-10-CM

## 2020-08-28 DIAGNOSIS — Z8673 Personal history of transient ischemic attack (TIA), and cerebral infarction without residual deficits: Secondary | ICD-10-CM

## 2020-08-28 DIAGNOSIS — R21 Rash and other nonspecific skin eruption: Secondary | ICD-10-CM | POA: Diagnosis present

## 2020-08-28 DIAGNOSIS — Z91013 Allergy to seafood: Secondary | ICD-10-CM | POA: Diagnosis not present

## 2020-08-28 DIAGNOSIS — C50411 Malignant neoplasm of upper-outer quadrant of right female breast: Secondary | ICD-10-CM | POA: Diagnosis present

## 2020-08-28 DIAGNOSIS — T50905A Adverse effect of unspecified drugs, medicaments and biological substances, initial encounter: Secondary | ICD-10-CM | POA: Diagnosis not present

## 2020-08-28 DIAGNOSIS — Z79810 Long term (current) use of selective estrogen receptor modulators (SERMs): Secondary | ICD-10-CM | POA: Diagnosis not present

## 2020-08-28 DIAGNOSIS — F17210 Nicotine dependence, cigarettes, uncomplicated: Secondary | ICD-10-CM | POA: Diagnosis present

## 2020-08-28 DIAGNOSIS — E876 Hypokalemia: Secondary | ICD-10-CM | POA: Diagnosis present

## 2020-08-28 DIAGNOSIS — Z803 Family history of malignant neoplasm of breast: Secondary | ICD-10-CM

## 2020-08-28 DIAGNOSIS — F209 Schizophrenia, unspecified: Secondary | ICD-10-CM | POA: Diagnosis present

## 2020-08-28 DIAGNOSIS — F909 Attention-deficit hyperactivity disorder, unspecified type: Secondary | ICD-10-CM | POA: Diagnosis present

## 2020-08-28 DIAGNOSIS — F319 Bipolar disorder, unspecified: Secondary | ICD-10-CM | POA: Diagnosis present

## 2020-08-28 DIAGNOSIS — Z91018 Allergy to other foods: Secondary | ICD-10-CM | POA: Diagnosis not present

## 2020-08-28 DIAGNOSIS — Z20822 Contact with and (suspected) exposure to covid-19: Secondary | ICD-10-CM | POA: Diagnosis present

## 2020-08-28 DIAGNOSIS — Z8614 Personal history of Methicillin resistant Staphylococcus aureus infection: Secondary | ICD-10-CM

## 2020-08-28 DIAGNOSIS — J029 Acute pharyngitis, unspecified: Secondary | ICD-10-CM | POA: Diagnosis not present

## 2020-08-28 DIAGNOSIS — L511 Stevens-Johnson syndrome: Secondary | ICD-10-CM | POA: Diagnosis present

## 2020-08-28 DIAGNOSIS — A539 Syphilis, unspecified: Secondary | ICD-10-CM | POA: Diagnosis present

## 2020-08-28 DIAGNOSIS — Z79899 Other long term (current) drug therapy: Secondary | ICD-10-CM

## 2020-08-28 DIAGNOSIS — Z853 Personal history of malignant neoplasm of breast: Secondary | ICD-10-CM | POA: Diagnosis not present

## 2020-08-28 DIAGNOSIS — F3289 Other specified depressive episodes: Secondary | ICD-10-CM

## 2020-08-28 DIAGNOSIS — T380X5A Adverse effect of glucocorticoids and synthetic analogues, initial encounter: Secondary | ICD-10-CM | POA: Diagnosis present

## 2020-08-28 DIAGNOSIS — Z823 Family history of stroke: Secondary | ICD-10-CM

## 2020-08-28 DIAGNOSIS — R739 Hyperglycemia, unspecified: Secondary | ICD-10-CM | POA: Diagnosis present

## 2020-08-28 DIAGNOSIS — A5149 Other secondary syphilitic conditions: Secondary | ICD-10-CM | POA: Diagnosis not present

## 2020-08-28 DIAGNOSIS — K219 Gastro-esophageal reflux disease without esophagitis: Secondary | ICD-10-CM | POA: Diagnosis present

## 2020-08-28 DIAGNOSIS — L270 Generalized skin eruption due to drugs and medicaments taken internally: Secondary | ICD-10-CM | POA: Diagnosis not present

## 2020-08-28 LAB — CBC WITH DIFFERENTIAL/PLATELET
Abs Immature Granulocytes: 0.01 10*3/uL (ref 0.00–0.07)
Basophils Absolute: 0 10*3/uL (ref 0.0–0.1)
Basophils Relative: 1 %
Eosinophils Absolute: 0.1 10*3/uL (ref 0.0–0.5)
Eosinophils Relative: 2 %
HCT: 32.8 % — ABNORMAL LOW (ref 36.0–46.0)
Hemoglobin: 11.3 g/dL — ABNORMAL LOW (ref 12.0–15.0)
Immature Granulocytes: 0 %
Lymphocytes Relative: 40 %
Lymphs Abs: 1.7 10*3/uL (ref 0.7–4.0)
MCH: 31.8 pg (ref 26.0–34.0)
MCHC: 34.5 g/dL (ref 30.0–36.0)
MCV: 92.4 fL (ref 80.0–100.0)
Monocytes Absolute: 0.4 10*3/uL (ref 0.1–1.0)
Monocytes Relative: 9 %
Neutro Abs: 2 10*3/uL (ref 1.7–7.7)
Neutrophils Relative %: 48 %
Platelets: 230 10*3/uL (ref 150–400)
RBC: 3.55 MIL/uL — ABNORMAL LOW (ref 3.87–5.11)
RDW: 13.1 % (ref 11.5–15.5)
WBC: 4.1 10*3/uL (ref 4.0–10.5)
nRBC: 0 % (ref 0.0–0.2)

## 2020-08-28 LAB — COMPREHENSIVE METABOLIC PANEL
ALT: 17 U/L (ref 0–44)
AST: 23 U/L (ref 15–41)
Albumin: 3.6 g/dL (ref 3.5–5.0)
Alkaline Phosphatase: 82 U/L (ref 38–126)
Anion gap: 5 (ref 5–15)
BUN: 10 mg/dL (ref 6–20)
CO2: 24 mmol/L (ref 22–32)
Calcium: 8.8 mg/dL — ABNORMAL LOW (ref 8.9–10.3)
Chloride: 107 mmol/L (ref 98–111)
Creatinine, Ser: 0.77 mg/dL (ref 0.44–1.00)
GFR, Estimated: >60 mL/min (ref >60)
Glucose, Bld: 156 mg/dL — ABNORMAL HIGH (ref 70–99)
Potassium: 3 mmol/L — ABNORMAL LOW (ref 3.5–5.1)
Sodium: 136 mmol/L (ref 135–145)
Total Bilirubin: 0.6 mg/dL (ref 0.3–1.2)
Total Protein: 7.1 g/dL (ref 6.5–8.1)

## 2020-08-28 LAB — RESP PANEL BY RT-PCR (FLU A&B, COVID) ARPGX2
Influenza A by PCR: NEGATIVE
Influenza B by PCR: NEGATIVE
SARS Coronavirus 2 by RT PCR: NEGATIVE

## 2020-08-28 LAB — CHLAMYDIA/NGC RT PCR (ARMC ONLY)
Chlamydia Tr: NOT DETECTED
N gonorrhoeae: NOT DETECTED

## 2020-08-28 LAB — PROTIME-INR
INR: 1 (ref 0.8–1.2)
Prothrombin Time: 13.4 seconds (ref 11.4–15.2)

## 2020-08-28 LAB — WET PREP, GENITAL
Clue Cells Wet Prep HPF POC: NONE SEEN
Sperm: NONE SEEN
Trich, Wet Prep: NONE SEEN
Yeast Wet Prep HPF POC: NONE SEEN

## 2020-08-28 LAB — APTT: aPTT: 30 seconds (ref 24–36)

## 2020-08-28 MED ORDER — DIPHENHYDRAMINE HCL 25 MG PO CAPS
25.0000 mg | ORAL_CAPSULE | Freq: Four times a day (QID) | ORAL | Status: DC
  Administered 2020-08-29 – 2020-08-31 (×8): 25 mg via ORAL
  Filled 2020-08-28 (×8): qty 1

## 2020-08-28 MED ORDER — TRAZODONE HCL 50 MG PO TABS: 25.0000 mg | ORAL_TABLET | Freq: Every evening | ORAL | Status: DC | PRN

## 2020-08-28 MED ORDER — ONDANSETRON HCL 4 MG PO TABS: 4.0000 mg | ORAL_TABLET | Freq: Four times a day (QID) | ORAL | Status: DC | PRN

## 2020-08-28 MED ORDER — FAMOTIDINE IN NACL 20-0.9 MG/50ML-% IV SOLN
20.0000 mg | Freq: Two times a day (BID) | INTRAVENOUS | Status: DC
  Administered 2020-08-29: 20 mg via INTRAVENOUS
  Filled 2020-08-28: qty 50

## 2020-08-28 MED ORDER — POTASSIUM CHLORIDE 20 MEQ PO PACK
40.0000 meq | PACK | Freq: Once | ORAL | Status: AC
  Administered 2020-08-29: 40 meq via ORAL
  Filled 2020-08-28: qty 2

## 2020-08-28 MED ORDER — ACETAMINOPHEN 325 MG PO TABS: 650.0000 mg | ORAL_TABLET | Freq: Four times a day (QID) | ORAL | Status: DC | PRN

## 2020-08-28 MED ORDER — METHYLPREDNISOLONE SODIUM SUCC 125 MG IJ SOLR
80.0000 mg | Freq: Once | INTRAMUSCULAR | Status: AC
  Administered 2020-08-28: 80 mg via INTRAVENOUS
  Filled 2020-08-28: qty 2

## 2020-08-28 MED ORDER — MORPHINE SULFATE (PF) 4 MG/ML IV SOLN
4.0000 mg | Freq: Once | INTRAVENOUS | Status: AC
  Administered 2020-08-28: 4 mg via INTRAVENOUS
  Filled 2020-08-28: qty 1

## 2020-08-28 MED ORDER — ACETAMINOPHEN 650 MG RE SUPP: 650.0000 mg | Freq: Four times a day (QID) | RECTAL | Status: DC | PRN

## 2020-08-28 MED ORDER — VENLAFAXINE HCL ER 37.5 MG PO CP24
37.5000 mg | ORAL_CAPSULE | Freq: Every day | ORAL | Status: DC
  Administered 2020-08-29 – 2020-08-31 (×3): 37.5 mg via ORAL
  Filled 2020-08-28 (×4): qty 1

## 2020-08-28 MED ORDER — ASCORBIC ACID 500 MG PO TABS
250.0000 mg | ORAL_TABLET | Freq: Every day | ORAL | Status: DC
  Administered 2020-08-29 – 2020-08-31 (×3): 250 mg via ORAL
  Filled 2020-08-28 (×3): qty 1

## 2020-08-28 MED ORDER — MAGNESIUM HYDROXIDE 400 MG/5ML PO SUSP: 30.0000 mL | Freq: Every day | ORAL | Status: DC | PRN

## 2020-08-28 MED ORDER — DIPHENHYDRAMINE HCL 50 MG/ML IJ SOLN
50.0000 mg | Freq: Once | INTRAMUSCULAR | Status: AC
  Administered 2020-08-28: 50 mg via INTRAVENOUS
  Filled 2020-08-28: qty 1

## 2020-08-28 MED ORDER — ENOXAPARIN SODIUM 40 MG/0.4ML IJ SOSY
40.0000 mg | PREFILLED_SYRINGE | INTRAMUSCULAR | Status: DC
  Administered 2020-08-29 – 2020-08-31 (×3): 40 mg via SUBCUTANEOUS
  Filled 2020-08-28 (×3): qty 0.4

## 2020-08-28 MED ORDER — METHYLPREDNISOLONE SODIUM SUCC 40 MG IJ SOLR
40.0000 mg | Freq: Three times a day (TID) | INTRAMUSCULAR | Status: DC
  Administered 2020-08-28 – 2020-08-29 (×3): 40 mg via INTRAVENOUS
  Filled 2020-08-28 (×3): qty 1

## 2020-08-28 MED ORDER — POTASSIUM CHLORIDE IN NACL 20-0.9 MEQ/L-% IV SOLN
INTRAVENOUS | Status: DC
  Filled 2020-08-28 (×3): qty 1000

## 2020-08-28 MED ORDER — FAMOTIDINE IN NACL 20-0.9 MG/50ML-% IV SOLN
20.0000 mg | Freq: Once | INTRAVENOUS | Status: AC
  Administered 2020-08-28: 20 mg via INTRAVENOUS
  Filled 2020-08-28: qty 50

## 2020-08-28 MED ORDER — SODIUM CHLORIDE 0.9 % IV BOLUS: 250.0000 mL | Freq: Once | INTRAVENOUS | Status: DC

## 2020-08-28 MED ORDER — TAMOXIFEN CITRATE 10 MG PO TABS
20.0000 mg | ORAL_TABLET | Freq: Every day | ORAL | Status: DC
  Administered 2020-08-29 – 2020-08-31 (×3): 20 mg via ORAL
  Filled 2020-08-28 (×4): qty 2

## 2020-08-28 MED ORDER — MORPHINE SULFATE (PF) 2 MG/ML IV SOLN: 1.0000 mg | INTRAVENOUS | Status: DC | PRN

## 2020-08-28 MED ORDER — ONDANSETRON HCL 4 MG/2ML IJ SOLN: 4.0000 mg | Freq: Four times a day (QID) | INTRAMUSCULAR | Status: DC | PRN

## 2020-08-28 NOTE — ED Notes (Signed)
Pt given ice pack

## 2020-08-28 NOTE — ED Notes (Signed)
[facility] transfer center called for potential transfer

## 2020-08-28 NOTE — ED Notes (Signed)
covid swab sent

## 2020-08-28 NOTE — H&P (Signed)
[place]   PATIENT NAME: Carol Crawford    MR#:  [id number]  DATE OF BIRTH:  [birth year]  DATE OF ADMISSION:  08/28/2020  PRIMARY CARE PHYSICIAN: System, Provider Not In   Patient is coming from: Home  REQUESTING/REFERRING PHYSICIAN: Marlana Salvage, PA  CHIEF COMPLAINT:   Chief Complaint  Patient presents with  . Rash    HISTORY OF PRESENT ILLNESS:  Carol Crawford is a 45 y.o. female with medical history significant for bipolar disorder, ADHD, GERD, right breast cancer status postlumpectomy, radiotherapy, on tamoxifen, schizophrenia, CVA, who presented to the emergency room with a [place] of diffuse body rash.  The patient was given p.o. clindamycin on 5/22 at [facility] for acute pharyngitis and was expected to take it for 10 days.  For the last 7 days she has been having papular eruption throughout her body and involving her face.  For the last 3 days she had mucosal involvement in her vaginal with significant vaginal itching and mild dysuria.  She denies any fever or chills.  No nausea or vomiting or abdominal pain.  No tongue swelling or neck swelling.  No recent new lotions or detergents or soaps or other medications. ED Course: Upon presenting to the ER, blood pressure was 131/92 with a temperature of 100.3 and otherwise normal vital signs.  Labs revealed hypokalemia at 3 mild anemia.  Influenza antigens and COVID-19 PCR came back negative.  She had a wet prep that showed few WBCs.  The patient was given IV Benadryl and Pepcid and I added IV Solu-Medrol for suspected adverse drug reaction to clindamycin.  For concern about Stevens-Johnson syndrome with mucosal involvement [facility] burn center was called and requested the punch biopsy before excepting transfer.  There were no other beds available at [facility].  Dermatology consult was requested and is currently pending.  The patient will be admitted to a progressive unit bed for further evaluation and  management. PAST MEDICAL HISTORY:   Past Medical History:  Diagnosis Date  . ADHD   . Bipolar 1 disorder ([place])   . Breast cancer ([place])    currently in treatment for  . Breast disorder    right breast cancer  . Depressed   . Drug overdose 2010  . Family history of breast cancer   . Family history of colon cancer   . Family history of prostate cancer   . GERD (gastroesophageal reflux disease)    OCC  . Headache    MIGRAINES (None for 2 months)  . History of MRSA infection    foot infection  . Personal history of radiation therapy 01/28/2019   right breast  . Schizophrenia ([place])   . Stroke [facility])    after foot surgery, left side was numb, fully recovered    PAST SURGICAL HISTORY:   Past Surgical History:  Procedure Laterality Date  . BREAST BIOPSY Right 10/21/2018   Affirm Bx-"X" clip-path pending  . BREAST LUMPECTOMY Right 12/02/2018   malignant  . BREAST LUMPECTOMY WITH RADIOACTIVE SEED AND SENTINEL LYMPH NODE BIOPSY Right 12/02/2018   Procedure: RIGHT BREAST LUMPECTOMY WITH RADIOACTIVE SEED AND SENTINEL LYMPH NODE BIOPSY;  Surgeon: Jovita Kussmaul, MD;  Location: [place];  Service: General;  Laterality: Right;  . INCISION AND DRAINAGE Left 01/09/2018   Procedure: INCISION AND DRAINAGE-below fascia foot; multiple;  Surgeon: [name], DPM;  Location: [facility];  Service: Podiatry;  Laterality: Left;  . INCISION AND DRAINAGE  Left 04/17/2018   Procedure: I&D; BELOW FASCIA FOOT; MULTIPLE;  Surgeon: [name], DPM;  Location: [facility];  Service: Podiatry;  Laterality: Left;  . INCISION AND DRAINAGE Right 12/18/2018   Procedure: PLACEMENT OF DRAIN IN RIGHT AXILLA;  Surgeon: Jovita Kussmaul, MD;  Location: [place];  Service: General;  Laterality: Right;  . INCISION AND DRAINAGE Left 04/12/2020   Procedure: INCISION AND DRAINAGE BELOW FASCIA FOOT, SINGLE LEFT;  Surgeon: [name], DPM;  Location: [place];  Service: Podiatry;  Laterality: Left;   leave first for transportation  . REPAIR EXTENSOR TENDON Left 09/05/2017   Procedure: REPAIR EXTENSOR TENDON;  Surgeon: [name], DPM;  Location: [facility];  Service: Podiatry;  Laterality: Left;    SOCIAL HISTORY:   Social History   Tobacco Use  . Smoking status: Current Some Day Smoker    Packs/day: 0.25    Years: 23.00    Pack years: 5.75    Types: Cigarettes  . Smokeless tobacco: Never Used  . Tobacco comment: 2 cigs daily  Substance Use Topics  . Alcohol use: Yes    Comment: occasionally    FAMILY HISTORY:   Family History  Problem Relation Age of Onset  . Arthritis Mother   . Hypertension Mother   . Cervical cancer Mother   . Prostate cancer Father   . Lung cancer Father   . Diabetes Paternal Aunt   . Breast cancer Paternal Aunt   . ADD / ADHD Daughter   . Bipolar disorder Daughter   . Hypertension Maternal Grandmother   . Stroke Maternal Grandmother   . Prostate cancer Maternal Grandfather   . Hypertension Maternal Grandfather   . Colon cancer Maternal Grandfather   . Hypertension Paternal Grandfather   . Stroke Paternal Grandfather   . Breast cancer Cousin     DRUG ALLERGIES:   Allergies  Allergen Reactions  . Shrimp [Shellfish Allergy] Hives  . Penicillins Hives and Other (See Comments)    Has patient had a PCN reaction causing immediate rash, facial/tongue/throat swelling, SOB or lightheadedness with hypotension: No Has patient had a PCN reaction causing severe rash involving mucus membranes or skin necrosis: Yes Has patient had a PCN reaction that required hospitalization: No Has patient had a PCN reaction occurring within the last 10 years: Yes If all of the above answers are "NO", then may proceed with Cephalosporin use.   . Tomato Rash    REVIEW OF SYSTEMS:   ROS As per history of present illness. All pertinent systems were reviewed above. Constitutional, HEENT, cardiovascular, respiratory, GI, GU, musculoskeletal, neuro, psychiatric,  endocrine, integumentary and hematologic systems were reviewed and are otherwise negative/unremarkable except for positive findings mentioned above in the HPI.   MEDICATIONS AT HOME:   Prior to Admission medications   Medication Sig Start Date End Date Taking? Authorizing Provider  Ascorbic Acid (VITAMIN C PO) Take by mouth.    [provider]  chlorhexidine (PERIDEX) 0.12 % solution 15 mLs by Mouth Rinse route 2 (two) times daily. 10/06/18   [provider]  ibuprofen (ADVIL,MOTRIN) 600 MG tablet Take 600 mg by mouth every 6 (six) hours as needed for headache or moderate pain.     [provider]  mupirocin ointment (BACTROBAN) 2 % Place 1 application into the nose 2 (two) times daily. Patient not taking: Reported on 09/17/2019 10/06/18   [provider]  nicotine (NICODERM CQ - DOSED IN MG/24 HR) 7 mg/24hr patch APPLY 1 PATCH TOPICALLY ONCE  DAILY 09/30/19   Nicholas Lose, MD  tamoxifen (NOLVADEX) 20 MG tablet Take 1 tablet (20 mg total) by mouth daily. 05/01/20   Nicholas Lose, MD  venlafaxine XR (EFFEXOR-XR) 37.5 MG 24 hr capsule Take 1 capsule (37.5 mg total) by mouth daily with breakfast. Increase to two tablets daily after 1 week 11/01/19   Nicholas Lose, MD      VITAL SIGNS:  Blood pressure (!) 151/92, pulse 97, temperature 100.3 F (37.9 C), temperature source Oral, resp. rate 16, height 5\' 7"  (1.702 m), weight 75.2 kg, SpO2 98 %.  PHYSICAL EXAMINATION:  Physical Exam  GENERAL:  45 y.o.-year-old patient lying in the bed with no acute distress.  EYES: Pupils equal, round, reactive to light and accommodation. No scleral icterus. Extraocular muscles intact.  HEENT: Head atraumatic, normocephalic. Oropharynx and nasopharynx clear.  NECK:  Supple, no jugular venous distention. No thyroid enlargement, no tenderness.  LUNGS: Normal breath sounds bilaterally, no wheezing, rales,rhonchi or crepitation. No use of accessory muscles of respiration.   CARDIOVASCULAR: Regular rate and rhythm, S1, S2 normal. No murmurs, rubs, or gallops.  ABDOMEN: Soft, nondistended, nontender. Bowel sounds present. No organomegaly or mass.  EXTREMITIES: No pedal edema, cyanosis, or clubbing.  NEUROLOGIC: Cranial nerves II through XII are intact. Muscle strength 5/5 in all extremities. Sensation intact. Gait not checked.  PSYCHIATRIC: The patient is alert and oriented x 3.  Normal affect and good eye contact. SKIN: As below she has diffuse papular eruption extending from her head down to her back over her chest wall and less on the abdomen unless on her lower extremities.  She had perivaginal abruption as well as vaginal mucosal ulcerations.  No detected buccal mucosal ulcers.       LABORATORY PANEL:   CBC Recent Labs  Lab 08/28/20 1728  WBC 4.1  HGB 11.3*  HCT 32.8*  PLT 230   ------------------------------------------------------------------------------------------------------------------  Chemistries  Recent Labs  Lab 08/28/20 1728  NA 136  K 3.0*  CL 107  CO2 24  GLUCOSE 156*  BUN 10  CREATININE 0.77  CALCIUM 8.8*  AST 23  ALT 17  ALKPHOS 82  BILITOT 0.6   ------------------------------------------------------------------------------------------------------------------  Cardiac Enzymes No results for input(s): TROPONINI in the last 168 hours. ------------------------------------------------------------------------------------------------------------------  RADIOLOGY:  No results found.    IMPRESSION AND PLAN:  Active Problems:   Stevens-Johnson disease ([place])  1.  Adverse drug reaction to clindamycin with mucosal involvement possibly associated with early Stevens-Johnson syndrome.  Patient is current hemodynamically stable. - The patient admitted to a progressive unit bed. - We will continue the patient on IV steroid therapy with Solu-Medrol as well as IV H1 and H2 blocker therapy with IV Benadryl and Pepcid on a  scheduled basis. - Dermatology consultation will be obtained. - I contacted [place] [facility] and left a message for urgent consult for punch biopsy.  2.  Recent acute pharyngitis. - This is currently resolving.  3.  History of right breast cancer status postlumpectomy and radiotherapy. - We will continue her tamoxifen.  4.  Depression. - Continue Effexor XR  DVT prophylaxis: Lovenox. Code Status: full code. Family Communication:  The plan of care was discussed in details with the patient (who requested no other family members to be notified at this time). I answered all questions. The patient agreed to proceed with the above mentioned plan. Further management will depend upon hospital course. Disposition Plan: Back to previous home environment Consults called: Dermatology. All the records are reviewed and  case discussed with ED provider.  Status is: Inpatient  Remains inpatient appropriate because:Ongoing active pain requiring inpatient pain management, Ongoing diagnostic testing needed not appropriate for outpatient work up, Unsafe d/c plan, IV treatments appropriate due to intensity of illness or inability to take PO and Inpatient level of care appropriate due to severity of illness   Dispo: The patient is from: Home              Anticipated d/c is to: Home              Patient currently is not medically stable to d/c.   Difficult to place patient No   TOTAL TIME TAKING CARE OF THIS PATIENT: 55 minutes.    Christel Mormon M.D on 08/28/2020 at 8:57 PM  [facility]   From 7 PM-7 AM, contact night-coverage [email or web address]  CC: Primary care physician; System, Provider Not In

## 2020-08-28 NOTE — ED Provider Notes (Signed)
[facility] Emergency Department Provider Note  ____________________________________________   Event Date/Time   First MD Initiated Contact with Patient 08/28/20 2057     (approximate)  I have reviewed the triage vital signs and the nursing notes.   HISTORY  Chief Complaint Rash  HPI Carol Crawford is a 45 y.o. female with past medical history of breast cancer treated with lumpectomy, radiation therapy and now on chronic tamoxifen who presents to the emergency department for evaluation of rash.  She reports being treated at [facility] for pharyngitis towards the end of May with 10 days of clindamycin.  7 days ago, she developed a rash that began on her back, describes it as painful and itchy.  Over the last 7 days, the rash has continued to spread, and over the last 3 days has spread to being both in her nose as well as vaginal.  She denies any fevers, nausea vomiting or abdominal pain, headaches.  She states she was never on clindamycin before.  She has been using Benadryl at home which she says takes a slight edge off for short period of time but that the rash just continues to worsen.        Past Medical History:  Diagnosis Date  . ADHD   . Bipolar 1 disorder ([place])   . Breast cancer ([place])    currently in treatment for  . Breast disorder    right breast cancer  . Depressed   . Drug overdose 2010  . Family history of breast cancer   . Family history of colon cancer   . Family history of prostate cancer   . GERD (gastroesophageal reflux disease)    OCC  . Headache    MIGRAINES (None for 2 months)  . History of MRSA infection    foot infection  . Personal history of radiation therapy 01/28/2019   right breast  . Schizophrenia ([place])   . Stroke [facility])    after foot surgery, left side was numb, fully recovered    Patient Active Problem List   Diagnosis Date Noted  . Stevens-Johnson disease ([place]) 08/28/2020  . Schizophrenia ([place]) 09/17/2019  .  Self-mutilation 2007 09/17/2019  . Marijuana abuse daily 1-2 blunts 09/17/2019  . Alcohol abuse 12 pack q weekend 09/17/2019  . Smoker 2-4 cpd 09/17/2019  . Genetic testing 11/26/2018  . Family history of breast cancer 09/2018 with lumpectomy 12/02/18, radiation 01/28/19-03/31/19, Tamoxifen 03/2019-03/2024   . Family history of prostate cancer   . Family history of colon cancer   . Malignant neoplasm of upper-outer quadrant of right breast in female, estrogen receptor positive ([place]) 11/10/2018  . Acute ischemic stroke ([place])   . Left sided numbness 01/09/2018  . Depressed   . Bipolar 1 disorder ([place]) with SA 2020 (OD pain pills)     Past Surgical History:  Procedure Laterality Date  . BREAST BIOPSY Right 10/21/2018   Affirm Bx-"X" clip-path pending  . BREAST LUMPECTOMY Right 12/02/2018   malignant  . BREAST LUMPECTOMY WITH RADIOACTIVE SEED AND SENTINEL LYMPH NODE BIOPSY Right 12/02/2018   Procedure: RIGHT BREAST LUMPECTOMY WITH RADIOACTIVE SEED AND SENTINEL LYMPH NODE BIOPSY;  Surgeon: Jovita Kussmaul, MD;  Location: [place];  Service: General;  Laterality: Right;  . INCISION AND DRAINAGE Left 01/09/2018   Procedure: INCISION AND DRAINAGE-below fascia foot; multiple;  Surgeon: [name], DPM;  Location: [facility];  Service: Podiatry;  Laterality: Left;  . INCISION AND DRAINAGE Left 04/17/2018  Procedure: I&D; BELOW FASCIA FOOT; MULTIPLE;  Surgeon: [name], DPM;  Location: [facility];  Service: Podiatry;  Laterality: Left;  . INCISION AND DRAINAGE Right 12/18/2018   Procedure: PLACEMENT OF DRAIN IN RIGHT AXILLA;  Surgeon: Jovita Kussmaul, MD;  Location: [place];  Service: General;  Laterality: Right;  . INCISION AND DRAINAGE Left 04/12/2020   Procedure: INCISION AND DRAINAGE BELOW FASCIA FOOT, SINGLE LEFT;  Surgeon: [name], DPM;  Location: [place];  Service: Podiatry;  Laterality: Left;  leave first for transportation  . REPAIR EXTENSOR TENDON Left  09/05/2017   Procedure: REPAIR EXTENSOR TENDON;  Surgeon: [name], DPM;  Location: [facility];  Service: Podiatry;  Laterality: Left;    Prior to Admission medications   Medication Sig Start Date End Date Taking? Authorizing Provider  acetaminophen (TYLENOL) 500 MG tablet Take 500 mg by mouth every 6 (six) hours as needed for moderate pain or mild pain.   Yes [provider]  Ascorbic Acid (VITAMIN C PO) Take by mouth.   Yes [provider]  diphenhydrAMINE (BENADRYL) 25 mg capsule Take 25 mg by mouth every 6 (six) hours as needed for itching.   Yes [provider]  ibuprofen (ADVIL,MOTRIN) 600 MG tablet Take 600 mg by mouth every 6 (six) hours as needed for headache or moderate pain.    Yes [provider]  tamoxifen (NOLVADEX) 20 MG tablet Take 1 tablet (20 mg total) by mouth daily. 05/01/20  Yes Nicholas Lose, MD  chlorhexidine (PERIDEX) 0.12 % solution 15 mLs by Mouth Rinse route 2 (two) times daily. Patient not taking: Reported on 08/28/2020 10/06/18   [provider]  mupirocin ointment (BACTROBAN) 2 % Place 1 application into the nose 2 (two) times daily. Patient not taking: No sig reported 10/06/18   [provider]  nicotine (NICODERM CQ - DOSED IN MG/24 HR) 7 mg/24hr patch APPLY 1 PATCH TOPICALLY ONCE DAILY Patient not taking: Reported on 08/28/2020 09/30/19   Nicholas Lose, MD  venlafaxine XR (EFFEXOR-XR) 37.5 MG 24 hr capsule Take 1 capsule (37.5 mg total) by mouth daily with breakfast. Increase to two tablets daily after 1 week Patient not taking: Reported on 08/28/2020 11/01/19   Nicholas Lose, MD    Allergies Shrimp [shellfish allergy], Penicillins, and Tomato  Family History  Problem Relation Age of Onset  . Arthritis Mother   . Hypertension Mother   . Cervical cancer Mother   . Prostate cancer Father   . Lung cancer Father   . Diabetes Paternal Aunt   . Breast cancer Paternal Aunt   . ADD / ADHD Daughter   . Bipolar  disorder Daughter   . Hypertension Maternal Grandmother   . Stroke Maternal Grandmother   . Prostate cancer Maternal Grandfather   . Hypertension Maternal Grandfather   . Colon cancer Maternal Grandfather   . Hypertension Paternal Grandfather   . Stroke Paternal Grandfather   . Breast cancer Cousin     Social History Social History   Tobacco Use  . Smoking status: Current Some Day Smoker    Packs/day: 0.25    Years: 23.00    Pack years: 5.75    Types: Cigarettes  . Smokeless tobacco: Never Used  . Tobacco comment: 2 cigs daily  Vaping Use  . Vaping Use: Former  . Start date: 08/22/2017  Substance Use Topics  . Alcohol use: Yes    Comment: occasionally  . Drug use: Yes    Types: Marijuana  Comment: 1-2x week    Review of Systems Constitutional: No fever/chills Eyes: No visual changes. ENT: No sore throat. Cardiovascular: Denies chest pain. Respiratory: Denies shortness of breath. Gastrointestinal: No abdominal pain.  No nausea, no vomiting.  No diarrhea.  No constipation. Genitourinary: Negative for dysuria. Musculoskeletal: Negative for back pain. Skin: + Diffuse rash, including mucosa Neurological: Negative for headaches, focal weakness or numbness.  ____________________________________________   PHYSICAL EXAM:  VITAL SIGNS: ED Triage Vitals  Enc Vitals Group     BP 08/28/20 1609 (!) 151/92     Pulse Rate 08/28/20 1609 97     Resp 08/28/20 1609 16     Temp 08/28/20 1609 100.3 F (37.9 C)     Temp Source 08/28/20 1609 Oral     SpO2 08/28/20 1609 98 %     Weight 08/28/20 1609 165 lb 12.6 oz (75.2 kg)     Height 08/28/20 1609 5\' 7"  (1.702 m)     Head Circumference --      Peak Flow --      Pain Score 08/28/20 1604 8     Pain Loc --      Pain Edu? --      Excl. in [place]? --    Constitutional: Alert and oriented. Well appearing and in no acute distress. Eyes: Conjunctivae are normal. PERRL. EOMI. Head: Atraumatic. Nose: There are 2 lesions, 1 in each  nare that appears to be related to the further rash as described below. Mouth/Throat: Mucous membranes are moist.  Oropharynx erythematous with symmetric tonsillar enlargement with no exudates. Neck: No stridor.   Lymphatic: No cervical lymphadenopathy Cardiovascular: Normal rate, regular rhythm. Grossly normal heart sounds.  Good peripheral circulation. Respiratory: Normal respiratory effort.  No retractions. Lungs CTAB. Gastrointestinal: Soft and nontender. No distention. No abdominal bruits. No CVA tenderness. Genitourinary: There are multiple lesions noted to the labia majora, labia minora and internally on the right wall of the vagina, just past the introitus. Musculoskeletal: No lower extremity tenderness nor edema.  No joint effusions. Neurologic:  Normal speech and language. No gross focal neurologic deficits are appreciated. No gait instability. Skin: There is a scaly maculopapular rash noted to the entire body, more concentrated on the posterior low back and thorax at the reported site of origin.  The rash does spare the soles and palms.  The lesions are in multiple stages of healing.  There is no fluctuance or pustular drainage from any of the noted sites.  Negative Nikolsky sign.  There sites are blanchable.  Noted nasal mucosa and vaginal mucosa involvement as described above. Psychiatric: Mood and affect are normal. Speech and behavior are normal.  ____________________________________________   LABS (all labs ordered are listed, but only abnormal results are displayed)  Labs Reviewed  WET PREP, GENITAL - Abnormal; Notable for the following components:      Result Value   WBC, Wet Prep HPF POC FEW (*)    All other components within normal limits  CBC WITH DIFFERENTIAL/PLATELET - Abnormal; Notable for the following components:   RBC 3.55 (*)    Hemoglobin 11.3 (*)    HCT 32.8 (*)    All other components within normal limits  COMPREHENSIVE METABOLIC PANEL - Abnormal; Notable  for the following components:   Potassium 3.0 (*)    Glucose, Bld 156 (*)    Calcium 8.8 (*)    All other components within normal limits  CHLAMYDIA/NGC RT PCR ([facility] ONLY)  RESP PANEL BY RT-PCR (FLU A&B, COVID)  ARPGX2  PROTIME-INR  APTT  HIV ANTIBODY (ROUTINE TESTING W REFLEX)  RPR  BASIC METABOLIC PANEL  CBC  MAGNESIUM    ____________________________________________   INITIAL IMPRESSION / ASSESSMENT AND PLAN / ED COURSE  As part of my medical decision making, I reviewed the following data within the electronic MEDICAL RECORD NUMBER History obtained from family, Nursing notes reviewed and incorporated, Labs reviewed, Discussed with admitting physician Dr. Sidney Ace, Evaluated by EM attending Dr. Jari Pigg and Notes from prior ED visits        Patient is a 45 year old female with past medical history significant for breast cancer with radiation therapy on chronic tamoxifen use and recent completed course of clindamycin antibiotics for pharyngitis reports to the emergency department for diffuse rash has been present for the last 7 days, worsening for the last 3 including now lesions in the nose and vagina.  See HPI for further details.  In triage, patient was noted to be bordering febrile at 100.3, on personal recheck was 99.5.  Remainder of vitals are grossly within normal limits.  Physical exam as above.  Mostly concerning for systemic rash now involving mucosa after recently finishing clindamycin.  Differentials considered include RMSF, syphilis, HIV, SJS/TEN, other drug eruption.  No recent known tick borne illness, rash not present to the soles or palms, unlikely RMSF.  Will test for syphilis and HIV.  Highest differential is SJS/TN.  Patient was also seen and evaluated personally by my attending Dr. Jari Pigg.  Given concern for SJS/TEN and the fact that these are typically treated at burn facilities, will attempt transfer to [facility] where the patient receives most of her care.   Unfortunately, [facility] hospitalist was full and could not be transferred to the burn unit without a positive punch biopsy.  I was unable to contact our on-call dermatologist to determine if 1 might be able to be completed on what timeframe.  Given this, I attempted transfer to [facility] burn center.  Unfortunately, their hospital is also full.  Given that the local burn facilities are unable to accommodate the patient, discussed the case with our hospitalist for admission, and Dr. Sidney Ace accepts the patient for care.  She is stable at this time for transfer to the floor.      ____________________________________________   FINAL CLINICAL IMPRESSION(S) / ED DIAGNOSES  Final diagnoses:  Stevens-Johnson syndrome [facility])     ED Discharge Orders    None       Note:  This document was prepared using Dragon voice recognition software and may include unintentional dictation errors.   Marlana Salvage, PA 08/29/20 0009    Vanessa Mulga, MD 08/29/20 [phone]

## 2020-08-28 NOTE — ED Triage Notes (Signed)
Pt states was undergoing radiation for breast cancer, pt states dx with Covid approx 1 month ago. Pt states approx 1 week ago started having a rash, c/o rash to whole body. Pt states has been taking OTC benadryl which has helped with benadryl. Pt also c/o vaginal swelling and burning with urination due to "it's raw".

## 2020-08-28 NOTE — ED Notes (Signed)
See triage note  Presents with generalized rash for about 1 month   States positive itching  Then noticed some burning in vaginal area with rash

## 2020-08-28 NOTE — ED Notes (Signed)
[facility] Transfer (tyra) for burn/dermatology service

## 2020-08-29 DIAGNOSIS — A5149 Other secondary syphilitic conditions: Secondary | ICD-10-CM

## 2020-08-29 DIAGNOSIS — L270 Generalized skin eruption due to drugs and medicaments taken internally: Secondary | ICD-10-CM

## 2020-08-29 LAB — BASIC METABOLIC PANEL
Anion gap: 4 — ABNORMAL LOW (ref 5–15)
BUN: 7 mg/dL (ref 6–20)
CO2: 23 mmol/L (ref 22–32)
Calcium: 8.4 mg/dL — ABNORMAL LOW (ref 8.9–10.3)
Chloride: 109 mmol/L (ref 98–111)
Creatinine, Ser: 0.72 mg/dL (ref 0.44–1.00)
GFR, Estimated: >60 mL/min (ref >60)
Glucose, Bld: 148 mg/dL — ABNORMAL HIGH (ref 70–99)
Potassium: 4.2 mmol/L (ref 3.5–5.1)
Sodium: 136 mmol/L (ref 135–145)

## 2020-08-29 LAB — RPR
RPR Ser Ql: REACTIVE — AB
RPR Titer: 1:32 {titer}

## 2020-08-29 LAB — CBC
HCT: 34.7 % — ABNORMAL LOW (ref 36.0–46.0)
Hemoglobin: 12.1 g/dL (ref 12.0–15.0)
MCH: 32.5 pg (ref 26.0–34.0)
MCHC: 34.9 g/dL (ref 30.0–36.0)
MCV: 93.3 fL (ref 80.0–100.0)
Platelets: 230 10*3/uL (ref 150–400)
RBC: 3.72 MIL/uL — ABNORMAL LOW (ref 3.87–5.11)
RDW: 13.2 % (ref 11.5–15.5)
WBC: 4.4 10*3/uL (ref 4.0–10.5)
nRBC: 0 % (ref 0.0–0.2)

## 2020-08-29 LAB — MAGNESIUM: Magnesium: 2 mg/dL (ref 1.7–2.4)

## 2020-08-29 LAB — HIV ANTIBODY (ROUTINE TESTING W REFLEX): HIV Screen 4th Generation wRfx: NONREACTIVE

## 2020-08-29 MED ORDER — OXYCODONE-ACETAMINOPHEN 5-325 MG PO TABS
1.0000 | ORAL_TABLET | Freq: Four times a day (QID) | ORAL | Status: DC | PRN
  Administered 2020-08-29 – 2020-08-31 (×4): 1 via ORAL
  Filled 2020-08-29 (×4): qty 1

## 2020-08-29 MED ORDER — DOXYCYCLINE HYCLATE 100 MG PO TABS
100.0000 mg | ORAL_TABLET | Freq: Two times a day (BID) | ORAL | Status: AC
  Administered 2020-08-29 – 2020-08-30 (×3): 100 mg via ORAL
  Filled 2020-08-29 (×3): qty 1

## 2020-08-29 MED ORDER — MORPHINE SULFATE (PF) 2 MG/ML IV SOLN
1.0000 mg | INTRAVENOUS | Status: DC | PRN
  Administered 2020-08-29 – 2020-08-30 (×2): 1 mg via INTRAVENOUS
  Filled 2020-08-29 (×2): qty 1

## 2020-08-29 NOTE — ED Notes (Signed)
This RN to bedside, introduced self to patient, pt denies further needs, VS obtained by this RN. Pt A&O x4. Call bell within reach. Pt notified to contact family member at this time.

## 2020-08-29 NOTE — Progress Notes (Addendum)
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 Continue on IV steroids, pepcid & benadryl. [facility] called back said they do not do inpatient consults. Will have to f/u outpatient w/ dermatology    Acute pharyngitis: resolving  Hx of breast cancer: s/p lumpectomy & radiation. Continue on home dose of tamoxifen  Depression: severity unknown. W/ hx of bipolar disorder. Continue on home dose of effexor   Hyperglycemia: no hx of DM. Likely secondary to steroid use  DVT prophylaxis: lovenox  Code Status: full  Family Communication:  Disposition Plan: likely d/c back home   Level of care: Progressive Cardiac   Status is: Inpatient  Remains inpatient appropriate because:IV treatments appropriate due to intensity of illness or inability to take PO and Inpatient level of care appropriate due to severity of illness   Dispo: The patient is from: Home              Anticipated d/c is to: Home              Patient currently is not medically stable to d/c.   Difficult to place patient : unclear      Consultants:      Procedures:   Antimicrobials:    Subjective: Pt c/o intermittent itching   Objective: Vitals:   08/29/20 0200 08/29/20 0230 08/29/20 0430 08/29/20 0710  BP: 103/64 93/61 97/66  (!) 94/58  Pulse: (!) 59 (!) 55 (!) 54 67  Resp: 15 13 14 19   Temp:    98.2 F (36.8 C)  TempSrc:    Oral  SpO2: 100% 97% 98% 100%  Weight:      Height:       No intake or output data in the 24 hours ending 08/29/20 0803 Filed Weights   08/28/20 1609  Weight: 75.2 kg    Examination:  General exam: Appears calm and comfortable  Respiratory system: Clear to auscultation. Respiratory effort normal. Cardiovascular system: S1 & S2 +. No rubs,  gallops or clicks. No pedal edema. Gastrointestinal system: Abdomen is nondistended, soft and nontender. Normal bowel sounds heard. Central nervous system: Alert and oriented. Moves all extremities  Skin: diffuse papular eruption on back, chest, perivaginal abruption Psychiatry: Judgement and insight appear normal. Mood & affect appropriate.     Data Reviewed: I have personally reviewed following labs and imaging studies  CBC: Recent Labs  Lab 08/28/20 1728 08/29/20 0556  WBC 4.1 4.4  NEUTROABS 2.0  --   HGB 11.3* 12.1  HCT 32.8* 34.7*  MCV 92.4 93.3  PLT 230 408   Basic Metabolic Panel: Recent Labs  Lab 08/28/20 1728 08/28/20 1842 08/29/20 0556  NA 136  --  136  K 3.0*  --  4.2  CL 107  --  109  CO2 24  --  23  GLUCOSE 156*  --  148*  BUN 10  --  7  CREATININE 0.77  --  0.72  CALCIUM 8.8*  --  8.4*  MG  --  2.0  --    GFR: Estimated Creatinine Clearance: 96.9 mL/min (by C-G formula based on SCr of 0.72 mg/dL). Liver Function Tests: Recent Labs  Lab 08/28/20 1728  AST 23  ALT 17  ALKPHOS 82  BILITOT 0.6  PROT 7.1  ALBUMIN 3.6  No results for input(s): LIPASE, AMYLASE in the last 168 hours. No results for input(s): AMMONIA in the last 168 hours. Coagulation Profile: Recent Labs  Lab 08/28/20 1728  INR 1.0   Cardiac Enzymes: No results for input(s): CKTOTAL, CKMB, CKMBINDEX, TROPONINI in the last 168 hours. BNP (last 3 results) No results for input(s): PROBNP in the last 8760 hours. HbA1C: No results for input(s): HGBA1C in the last 72 hours. CBG: No results for input(s): GLUCAP in the last 168 hours. Lipid Profile: No results for input(s): CHOL, HDL, LDLCALC, TRIG, CHOLHDL, LDLDIRECT in the last 72 hours. Thyroid Function Tests: No results for input(s): TSH, T4TOTAL, FREET4, T3FREE, THYROIDAB in the last 72 hours. Anemia Panel: No results for input(s): VITAMINB12, FOLATE, FERRITIN, TIBC, IRON, RETICCTPCT in the last 72 hours. Sepsis Labs: No  results for input(s): PROCALCITON, LATICACIDVEN in the last 168 hours.  Recent Results (from the past 240 hour(s))  Wet prep, genital     Status: Abnormal   Collection Time: 08/28/20  6:42 PM   Specimen: Cervical/Vaginal swab  Result Value Ref Range Status   Yeast Wet Prep HPF POC NONE SEEN NONE SEEN Final   Trich, Wet Prep NONE SEEN NONE SEEN Final   Clue Cells Wet Prep HPF POC NONE SEEN NONE SEEN Final   WBC, Wet Prep HPF POC FEW (A) NONE SEEN Final   Sperm NONE SEEN  Final    Comment: Performed at [facility], [address] [place], [place] [address]  [facility] rt PCR [facility] only)     Status: None   Collection Time: 08/28/20  6:42 PM   Specimen: Cervical/Vaginal swab  Result Value Ref Range Status   Specimen source GC/Chlam ENDOCERVICAL  Final   Chlamydia Tr NOT DETECTED NOT DETECTED Final   N gonorrhoeae NOT DETECTED NOT DETECTED Final    Comment: (NOTE) This CT/NG assay has not been evaluated in patients with a history of  hysterectomy. Performed at [facility], [address] [place], [place] [address]   Resp Panel by RT-PCR (Flu A&B, Covid) Nasopharyngeal Swab     Status: None   Collection Time: 08/28/20  7:45 PM   Specimen: Nasopharyngeal Swab; Nasopharyngeal(NP) swabs in vial transport medium  Result Value Ref Range Status   SARS Coronavirus 2 by RT PCR NEGATIVE NEGATIVE Final    Comment: (NOTE) SARS-CoV-2 target nucleic acids are NOT DETECTED.  The SARS-CoV-2 RNA is generally detectable in upper respiratory specimens during the acute phase of infection. The lowest concentration of SARS-CoV-2 viral copies this assay can detect is 138 copies/mL. A negative result does not preclude SARS-Cov-2 infection and should not be used as the sole basis for treatment or other patient management decisions. A negative result may occur with  improper specimen collection/handling, submission of specimen other than nasopharyngeal swab, presence of viral  mutation(s) within the areas targeted by this assay, and inadequate number of viral copies(<138 copies/mL). A negative result must be combined with clinical observations, patient history, and epidemiological information. The expected result is Negative.  Fact Sheet for Patients:  EntrepreneurPulse.com.au  Fact Sheet for Healthcare Providers:  IncredibleEmployment.be  This test is no t yet approved or cleared by the [place] FDA and  has been authorized for detection and/or diagnosis of SARS-CoV-2 by FDA under an Emergency Use Authorization (EUA). This EUA will remain  in effect (meaning this test can be used) for the duration of the COVID-19 declaration under Section 564(b)(1) of the Act, 21 U.S.C.section 360bbb-3(b)(1), unless  the authorization is terminated  or revoked sooner.       Influenza A by PCR NEGATIVE NEGATIVE Final   Influenza B by PCR NEGATIVE NEGATIVE Final    Comment: (NOTE) The Xpert Xpress SARS-CoV-2/FLU/RSV plus assay is intended as an aid in the diagnosis of influenza from Nasopharyngeal swab specimens and should not be used as a sole basis for treatment. Nasal washings and aspirates are unacceptable for Xpert Xpress SARS-CoV-2/FLU/RSV testing.  Fact Sheet for Patients: EntrepreneurPulse.com.au  Fact Sheet for Healthcare Providers: IncredibleEmployment.be  This test is not yet approved or cleared by the [place] FDA and has been authorized for detection and/or diagnosis of SARS-CoV-2 by FDA under an Emergency Use Authorization (EUA). This EUA will remain in effect (meaning this test can be used) for the duration of the COVID-19 declaration under Section 564(b)(1) of the Act, 21 U.S.C. section 360bbb-3(b)(1), unless the authorization is terminated or revoked.  Performed at [facility], [address], [place], [place] [address]          Radiology  Studies: No results found.      Scheduled Meds: . vitamin C  250 mg Oral Daily  . diphenhydrAMINE  25 mg Oral Q6H  . enoxaparin (LOVENOX) injection  40 mg Subcutaneous Q24H  . methylPREDNISolone (SOLU-MEDROL) injection  40 mg Intravenous Q8H  . tamoxifen  20 mg Oral Daily  . venlafaxine XR  37.5 mg Oral Q breakfast   Continuous Infusions: . 0.9 % NaCl with KCl 20 mEq / L 100 mL/hr at 08/29/20 0043  . famotidine (PEPCID) IV       LOS: 1 day    Time spent: 30 mins     Wyvonnia Dusky, MD [facility] Pager [phone]  If 7PM-7AM, please contact night-coverage 08/29/2020, 8:03 AM \

## 2020-08-29 NOTE — Progress Notes (Signed)
Pt wanted to go smoke a cigarette and said she was going to leave if she could not go out to her car to smoke. Pt was offered a nicotine patch but refused.Pt is anxious. Pt said she would leave if not allowed to go smoke. MD made aware.MD offered to order xanax but pt refused that as well. It appeared that pt was going to leave AMA but then asked to not be bothered so that she could calm down. Charge nurse made aware.

## 2020-08-29 NOTE — ED Notes (Signed)
Pt provided with 2 warm blankets at this time.  

## 2020-08-29 NOTE — Consult Note (Signed)
NAME: Carol Crawford  DOB: Apr 12, 1975  MRN: [id number]  Date/Time: 08/29/2020 7:04 PM  REQUESTING PROVIDER: Jimmye Norman Subjective:  REASON FOR CONSULT: Syphilis ? Carol Crawford is a 45 y.o. female with a history of rt breast cancer s/p lumpectomy, radiation and on tamoxifen admitted with itchy rash over her body, sore throat and ulcers on the vagina for 2 weeks Pt went to [facility] ED on 5/14 with sore throat and was diagnosed with left tonsillitis and prescribed clindamycin- SARS cov2 was positive, but neg strep, Mono. She then saw ENT who concurred with the diagnosis and because of recurrent peritonsillar abscess in the past was considering tonsillectomy. Pt then developed a itchy rash 10-14  days ago and also noted ulceration vagina. She came to [facility] ED and was thought to have SJS. The hospitalist Dr.Mansy ordered RPR as he correctly suspected syphilis and the test is positive. I am asked to se eher as there is a history of PCN allergy. Pt says when she was 45 yrs old had rash- has taken amoxicillin/clavulunate in 2019.But she does not remember taking it She is upset about the diagnosis Says she has been with one partner for 15 years      Past Medical History:  Diagnosis Date  . ADHD   . Bipolar 1 disorder ([place])   . Breast cancer ([place])    currently in treatment for  . Breast disorder    right breast cancer  . Depressed   . Drug overdose 2010  . Family history of breast cancer   . Family history of colon cancer   . Family history of prostate cancer   . GERD (gastroesophageal reflux disease)    OCC  . Headache    MIGRAINES (None for 2 months)  . History of MRSA infection    foot infection  . Personal history of radiation therapy 01/28/2019   right breast  . Schizophrenia ([place])   . Stroke [facility])    after foot surgery, left side was numb, fully recovered    Past Surgical History:  Procedure Laterality Date  . BREAST BIOPSY Right 10/21/2018   Affirm Bx-"X" clip-path pending  .  BREAST LUMPECTOMY Right 12/02/2018   malignant  . BREAST LUMPECTOMY WITH RADIOACTIVE SEED AND SENTINEL LYMPH NODE BIOPSY Right 12/02/2018   Procedure: RIGHT BREAST LUMPECTOMY WITH RADIOACTIVE SEED AND SENTINEL LYMPH NODE BIOPSY;  Surgeon: Jovita Kussmaul, MD;  Location: [place];  Service: General;  Laterality: Right;  . INCISION AND DRAINAGE Left 01/09/2018   Procedure: INCISION AND DRAINAGE-below fascia foot; multiple;  Surgeon: [name], DPM;  Location: [facility];  Service: Podiatry;  Laterality: Left;  . INCISION AND DRAINAGE Left 04/17/2018   Procedure: I&D; BELOW FASCIA FOOT; MULTIPLE;  Surgeon: [name], DPM;  Location: [facility];  Service: Podiatry;  Laterality: Left;  . INCISION AND DRAINAGE Right 12/18/2018   Procedure: PLACEMENT OF DRAIN IN RIGHT AXILLA;  Surgeon: Jovita Kussmaul, MD;  Location: [place];  Service: General;  Laterality: Right;  . INCISION AND DRAINAGE Left 04/12/2020   Procedure: INCISION AND DRAINAGE BELOW FASCIA FOOT, SINGLE LEFT;  Surgeon: [name], DPM;  Location: [place];  Service: Podiatry;  Laterality: Left;  leave first for transportation  . REPAIR EXTENSOR TENDON Left 09/05/2017   Procedure: REPAIR EXTENSOR TENDON;  Surgeon: [name], DPM;  Location: [facility];  Service: Podiatry;  Laterality: Left;    Social History   Socioeconomic History  . Marital status: Single  Spouse name: Not on file  . Number of children: Not on file  . Years of education: Not on file  . Highest education level: Not on file  Occupational History  . Not on file  Tobacco Use  . Smoking status: Current Some Day Smoker    Packs/day: 0.25    Years: 23.00    Pack years: 5.75    Types: Cigarettes  . Smokeless tobacco: Never Used  . Tobacco comment: 2 cigs daily  Vaping Use  . Vaping Use: Former  . Start date: 08/22/2017  Substance and Sexual Activity  . Alcohol use: Yes    Comment: occasionally  . Drug use: Yes    Types: Marijuana     Comment: 1-2x week  . Sexual activity: Yes  Other Topics Concern  . Not on file  Social History Narrative  . Not on file   Social Determinants of Health   Financial Resource Strain: Not on file  Food Insecurity: Not on file  Transportation Needs: Not on file  Physical Activity: Not on file  Stress: Not on file  Social Connections: Not on file  Intimate Partner Violence: Not At Risk  . Fear of Current or Ex-Partner: No  . Emotionally Abused: No  . Physically Abused: No  . Sexually Abused: No    Family History  Problem Relation Age of Onset  . Arthritis Mother   . Hypertension Mother   . Cervical cancer Mother   . Prostate cancer Father   . Lung cancer Father   . Diabetes Paternal Aunt   . Breast cancer Paternal Aunt   . ADD / ADHD Daughter   . Bipolar disorder Daughter   . Hypertension Maternal Grandmother   . Stroke Maternal Grandmother   . Prostate cancer Maternal Grandfather   . Hypertension Maternal Grandfather   . Colon cancer Maternal Grandfather   . Hypertension Paternal Grandfather   . Stroke Paternal Grandfather   . Breast cancer Cousin    Allergies  Allergen Reactions  . Shrimp [Shellfish Allergy] Hives  . Penicillins Hives and Other (See Comments)    Has patient had a PCN reaction causing immediate rash, facial/tongue/throat swelling, SOB or lightheadedness with hypotension: No Has patient had a PCN reaction causing severe rash involving mucus membranes or skin necrosis: Yes Has patient had a PCN reaction that required hospitalization: No Has patient had a PCN reaction occurring within the last 10 years: Yes If all of the above answers are "NO", then may proceed with Cephalosporin use.   . Tomato Rash   I? Current Facility-Administered Medications  Medication Dose Route Frequency Provider Last Rate Last Admin  . acetaminophen (TYLENOL) tablet 650 mg  650 mg Oral Q6H PRN Mansy, Jan A, MD       Or  . acetaminophen (TYLENOL) suppository 650 mg  650  mg Rectal Q6H PRN Mansy, Jan A, MD      . ascorbic acid (VITAMIN C) tablet 250 mg  250 mg Oral Daily Mansy, Jan A, MD   250 mg at 08/29/20 1037  . diphenhydrAMINE (BENADRYL) capsule 25 mg  25 mg Oral Q6H Mansy, Jan A, MD   25 mg at 08/29/20 1321  . enoxaparin (LOVENOX) injection 40 mg  40 mg Subcutaneous Q24H Mansy, Jan A, MD   40 mg at 08/29/20 0043  . famotidine (PEPCID) IVPB 20 mg premix  20 mg Intravenous Q12H Mansy, Arvella Merles, MD   Stopped at 08/29/20 1110  . magnesium hydroxide (MILK OF MAGNESIA) suspension  30 mL  30 mL Oral Daily PRN Mansy, Jan A, MD      . morphine 2 MG/ML injection 1 mg  1 mg Intravenous Q4H PRN Wyvonnia Dusky, MD      . ondansetron [facility]) tablet 4 mg  4 mg Oral Q6H PRN Mansy, Jan A, MD       Or  . ondansetron [facility]) injection 4 mg  4 mg Intravenous Q6H PRN Mansy, Jan A, MD      . oxyCODONE-acetaminophen (PERCOCET/ROXICET) 5-325 MG per tablet 1 tablet  1 tablet Oral Q6H PRN Wyvonnia Dusky, MD   1 tablet at 08/29/20 1744  . tamoxifen (NOLVADEX) tablet 20 mg  20 mg Oral Daily Mansy, Jan A, MD   20 mg at 08/29/20 1725  . traZODone (DESYREL) tablet 25 mg  25 mg Oral QHS PRN Mansy, Jan A, MD      . venlafaxine XR (EFFEXOR-XR) 24 hr capsule 37.5 mg  37.5 mg Oral Q breakfast Mansy, Jan A, MD   37.5 mg at 08/29/20 1321     Abtx:  Anti-infectives (From admission, onward)   None      REVIEW OF SYSTEMS:  Const: negative fever, negative chills, negative weight loss Eyes: negative diplopia or visual changes, negative eye pain ENT:  sore throat ++ Resp: negative cough, hemoptysis, dyspnea Cards: negative for chest pain, palpitations, lower extremity edema GU: negative for frequency, has dysuria and no  Hematuria Sores vagina GI: Negative for abdominal pain, diarrhea, bleeding, constipation Skin: rash and pruritus Heme: negative for easy bruising and gum/nose bleeding MS: negative for myalgias, arthralgias, back pain and muscle weakness Neurolo:negative for  headaches, dizziness, vertigo, memory problems  Psych: upset Endocrine: negative for thyroid, diabetes Allergy/Immunology- PCN Objective:  VITALS:  BP 118/78 (BP Location: Right Arm)   Pulse (!) 53   Temp (!) 97.4 F (36.3 C) (Oral)   Resp 13   Ht 5\' 7"  (1.702 m)   Wt 73.4 kg   SpO2 100%   BMI 25.34 kg/m  PHYSICAL EXAM:  General: Alert, cooperative, no distress, appears stated age.  Head: Normocephalic, without obvious abnormality, atraumatic. Eyes: Conjunctivae clear, anicteric sclerae. Pupils are equal ENT Nares normal. No drainage or sinus tenderness. Tonsillar area whitish patch, ulceration          Neck: Supple, symmetrical, no adenopathy, thyroid: non tender no carotid bruit and no JVD. Back: No CVA tenderness. Lungs: b/l air entry Heart: Regular rate and rhythm, no murmur, rub or gallop. Abdomen: Soft, non-tender,not distended. Bowel sounds normal. No masses Extremities: atraumatic, no cyanosis. No edema. No clubbing Skin maculo papular eruption- scaly, no evidence of bulls eye, target lesion Vaginal ulcers in healing phase Lymph: Cervical, supraclavicular normal. Neurologic: Grossly non-focal Pertinent Labs Lab Results CBC    Component Value Date/Time   WBC 4.4 08/29/2020 0556   RBC 3.72 (L) 08/29/2020 0556   HGB 12.1 08/29/2020 0556   HCT 34.7 (L) 08/29/2020 0556   PLT 230 08/29/2020 0556   MCV 93.3 08/29/2020 0556   MCH 32.5 08/29/2020 0556   MCHC 34.9 08/29/2020 0556   RDW 13.2 08/29/2020 0556   LYMPHSABS 1.7 08/28/2020 1728   MONOABS 0.4 08/28/2020 1728   EOSABS 0.1 08/28/2020 1728   BASOSABS 0.0 08/28/2020 1728    CMP Latest Ref Rng & Units 08/29/2020 08/28/2020 12/13/2018  Glucose 70 - 99 mg/dL 148(H) 156(H) 96  BUN 6 - 20 mg/dL 7 10 11   Creatinine 0.44 - 1.00 mg/dL 0.72 0.77 0.78  Sodium 135 - 145 mmol/L 136 136 137  Potassium 3.5 - 5.1 mmol/L 4.2 3.0(L) 3.5  Chloride 98 - 111 mmol/L 109 107 104  CO2 22 - 32 mmol/L 23 24 25   Calcium 8.9  - 10.3 mg/dL 8.4(L) 8.8(L) 9.2  Total Protein 6.5 - 8.1 g/dL - 7.1 -  Total Bilirubin 0.3 - 1.2 mg/dL - 0.6 -  Alkaline Phos 38 - 126 U/L - 82 -  AST 15 - 41 U/L - 23 -  ALT 0 - 44 U/L - 17 -      Microbiology: Recent Results (from the past 240 hour(s))  Wet prep, genital     Status: Abnormal   Collection Time: 08/28/20  6:42 PM   Specimen: Cervical/Vaginal swab  Result Value Ref Range Status   Yeast Wet Prep HPF POC NONE SEEN NONE SEEN Final   Trich, Wet Prep NONE SEEN NONE SEEN Final   Clue Cells Wet Prep HPF POC NONE SEEN NONE SEEN Final   WBC, Wet Prep HPF POC FEW (A) NONE SEEN Final   Sperm NONE SEEN  Final    Comment: Performed at [facility], [address] [place], [place] [address]  [place] rt PCR [facility] only)     Status: None   Collection Time: 08/28/20  6:42 PM   Specimen: Cervical/Vaginal swab  Result Value Ref Range Status   Specimen source GC/Chlam ENDOCERVICAL  Final   Chlamydia Tr NOT DETECTED NOT DETECTED Final   N gonorrhoeae NOT DETECTED NOT DETECTED Final    Comment: (NOTE) This CT/NG assay has not been evaluated in patients with a history of  hysterectomy. Performed at [facility], [address] [place], [place] [address]   Resp Panel by RT-PCR (Flu A&B, Covid) Nasopharyngeal Swab     Status: None   Collection Time: 08/28/20  7:45 PM   Specimen: Nasopharyngeal Swab; Nasopharyngeal(NP) swabs in vial transport medium  Result Value Ref Range Status   SARS Coronavirus 2 by RT PCR NEGATIVE NEGATIVE Final    Comment: (NOTE) SARS-CoV-2 target nucleic acids are NOT DETECTED.  The SARS-CoV-2 RNA is generally detectable in upper respiratory specimens during the acute phase of infection. The lowest concentration of SARS-CoV-2 viral copies this assay can detect is 138 copies/mL. A negative result does not preclude SARS-Cov-2 infection and should not be used as the sole basis for treatment or other patient management decisions. A  negative result may occur with  improper specimen collection/handling, submission of specimen other than nasopharyngeal swab, presence of viral mutation(s) within the areas targeted by this assay, and inadequate number of viral copies(<138 copies/mL). A negative result must be combined with clinical observations, patient history, and epidemiological information. The expected result is Negative.  Fact Sheet for Patients:  EntrepreneurPulse.com.au  Fact Sheet for Healthcare Providers:  IncredibleEmployment.be  This test is no t yet approved or cleared by the [place] FDA and  has been authorized for detection and/or diagnosis of SARS-CoV-2 by FDA under an Emergency Use Authorization (EUA). This EUA will remain  in effect (meaning this test can be used) for the duration of the COVID-19 declaration under Section 564(b)(1) of the Act, 21 U.S.C.section 360bbb-3(b)(1), unless the authorization is terminated  or revoked sooner.       Influenza A by PCR NEGATIVE NEGATIVE Final   Influenza B by PCR NEGATIVE NEGATIVE Final    Comment: (NOTE) The Xpert Xpress SARS-CoV-2/FLU/RSV plus assay is intended as an aid in the diagnosis of influenza from  Nasopharyngeal swab specimens and should not be used as a sole basis for treatment. Nasal washings and aspirates are unacceptable for Xpert Xpress SARS-CoV-2/FLU/RSV testing.  Fact Sheet for Patients: EntrepreneurPulse.com.au  Fact Sheet for Healthcare Providers: IncredibleEmployment.be  This test is not yet approved or cleared by the [place] FDA and has been authorized for detection and/or diagnosis of SARS-CoV-2 by FDA under an Emergency Use Authorization (EUA). This EUA will remain in effect (meaning this test can be used) for the duration of the COVID-19 declaration under Section 564(b)(1) of the Act, 21 U.S.C. section 360bbb-3(b)(1), unless the authorization  is terminated or revoked.  Performed at [facility], [address], [place], [place] [address]     IMAGING RESULTS:None  I have personally reviewed the films ? Impression/Recommendation ? Secondary syphilis- involving skin, and oral cavity and genitalia Clinical picture is not stevens johnson syndrome- DC steroid GC /Chl negative HIV NR ? ?PCN allergy - says she had a rash as a child-  Can do PCN allergy test but is currently on H1 and H2 blocker which can affect the test result.  The other option is to start Doxycycline PO and then once the skin rash is resolved , can then do a PCN allergy test as OP and given benzathine penicllin. Third option is to give Pencillin PO and check to see any immediate side effects and if none can give benzathine penicillin- the concern is BP is long acting and we may not be able to control the side effects once started  Ca breast rt s/p lumpectomy, radiation and on tamoxifen   ___________________________________________________ Discussed with patient, requesting provider Note:  This document was prepared using Dragon voice recognition software and may include unintentional dictation errors.

## 2020-08-29 NOTE — ED Notes (Signed)
Pt given warm blanket.

## 2020-08-30 DIAGNOSIS — Z853 Personal history of malignant neoplasm of breast: Secondary | ICD-10-CM

## 2020-08-30 DIAGNOSIS — R739 Hyperglycemia, unspecified: Secondary | ICD-10-CM

## 2020-08-30 LAB — PREGNANCY, URINE: Preg Test, Ur: NEGATIVE

## 2020-08-30 LAB — T.PALLIDUM AB, TOTAL: T Pallidum Abs: REACTIVE — AB

## 2020-08-30 MED ORDER — AMOXICILLIN 500 MG PO CAPS
500.0000 mg | ORAL_CAPSULE | Freq: Once | ORAL | Status: AC
  Administered 2020-08-30: 500 mg via ORAL
  Filled 2020-08-30: qty 1

## 2020-08-30 MED ORDER — EPINEPHRINE 0.3 MG/0.3ML IJ SOAJ
0.3000 mg | Freq: Once | INTRAMUSCULAR | Status: DC | PRN
  Filled 2020-08-30 (×2): qty 0.3

## 2020-08-30 MED ORDER — PENICILLIN G BENZATHINE 1200000 UNIT/2ML IM SUSY
2.4000 10*6.[IU] | PREFILLED_SYRINGE | Freq: Once | INTRAMUSCULAR | Status: AC
  Administered 2020-08-30: 2.4 10*6.[IU] via INTRAMUSCULAR
  Filled 2020-08-30: qty 4

## 2020-08-30 MED ORDER — SODIUM CHLORIDE 0.9% FLUSH
10.0000 mL | Freq: Two times a day (BID) | INTRAVENOUS | Status: DC
  Administered 2020-08-30 – 2020-08-31 (×2): 10 mL via INTRAVENOUS

## 2020-08-30 MED ORDER — DIPHENHYDRAMINE HCL 50 MG/ML IJ SOLN
25.0000 mg | Freq: Once | INTRAMUSCULAR | Status: DC | PRN
  Filled 2020-08-30: qty 1

## 2020-08-30 NOTE — Progress Notes (Signed)
PROGRESS NOTE    HPI was taken from Dr. Sidney Ace: Carol Crawford is a 45 y.o. female with medical history significant for bipolar disorder, ADHD, GERD, right breast cancer status postlumpectomy, radiotherapy, on tamoxifen, schizophrenia, CVA, who presented to the emergency room with a [place] of diffuse body rash.  The patient was given p.o. clindamycin on 5/22 at [facility] for acute pharyngitis and was expected to take it for 10 days.  For the last 7 days she has been having papular eruption throughout her body and involving her face.  For the last 3 days she had mucosal involvement in her vaginal with significant vaginal itching and mild dysuria.  She denies any fever or chills.  No nausea or vomiting or abdominal pain.  No tongue swelling or neck swelling.  No recent new lotions or detergents or soaps or other medications. ED Course: Upon presenting to the ER, blood pressure was 131/92 with a temperature of 100.3 and otherwise normal vital signs.  Labs revealed hypokalemia at 3 mild anemia.  Influenza antigens and COVID-19 PCR came back negative.  She had a wet prep that showed few WBCs.  The patient was given IV Benadryl and Pepcid and I added IV Solu-Medrol for suspected adverse drug reaction to clindamycin.  For concern about Stevens-Johnson syndrome with mucosal involvement [facility] burn center was called and requested the punch biopsy before excepting transfer.  There were no other beds available at [facility].  Dermatology consult was requested and is currently pending.  The patient will be admitted to a progressive unit bed for further evaluation and management.  Hospital course from Dr. Jimmye Norman 6/7-08/30/20: Pt was initially thought to have allergic/ADR to clindamycin use but pt's RPR was positive. Pt has a rash that involves skin, genitalia & mucosal involvement and likely secondary to secondary to syphilis as per ID. Pt was started on amoxicillin today. Of note, pt has had 1 partner for  15 years and has discussed these new findings with her partner already. Can likely be d/c tomorrow as pt needs to get benzathine penicillin G as per ID. Hx of penicillin allergy. Please see ID notes     Carol Crawford  ONG:[id number] DOB: [birth year] DOA: 08/28/2020 PCP: System, Provider Not In  Assessment & Plan:   Active Problems:   Stevens-Johnson disease ([place])   Secondary syphilis: as per ID w/ involvement on skin, mucosal surfaces & genitalia. Steroids were d/c. RPR was reactive & confirmatory testing is pending. Gonorrhea/chlamydia were neg. HIV is non-reactive.  Continue on amoxicillin as per ID. One sexual partner for 15 years and pt has discussed w/ her partner already    Acute pharyngitis: resolving  Hx of breast cancer: s/p lumpectomy & radiation. Continue on home dose of tamoxifen   Depression: severity unknown. W/ hx of bipolar disorder. Continue on home dose of effexor   Hyperglycemia: no hx of DM. Likely secondary to steroid use this admission. Trending down   DVT prophylaxis: lovenox  Code Status: full  Family Communication:  Disposition Plan: likely d/c back home   Level of care: Progressive Cardiac   Status is: Inpatient  Remains inpatient appropriate because:IV treatments appropriate due to intensity of illness or inability to take PO and Inpatient level of care appropriate due to severity of illness   Dispo: The patient is from: Home              Anticipated d/c is to: Home  Patient currently is not medically stable to d/c.   Difficult to place patient : unclear      Consultants:      Procedures:   Antimicrobials:    Subjective: Pt c/o fatigue   Objective: Vitals:   08/29/20 0834 08/29/20 1202 08/29/20 2131 08/30/20 0341  BP: 120/73 118/78 125/78 111/63  Pulse: (!) 55 (!) 53 (!) 47 (!) 57  Resp: 17 13 16 17   Temp: 98.8 F (37.1 C) (!) 97.4 F (36.3 C) 98 F (36.7 C) 98.3 F (36.8 C)  TempSrc: Oral Oral    SpO2: 100% 100%  99% 99%  Weight: 73.4 kg     Height: 5\' 7"  (1.702 m)       Intake/Output Summary (Last 24 hours) at 08/30/2020 0812 Last data filed at 08/29/2020 1526 Gross per 24 hour  Intake 748.17 ml  Output 300 ml  Net 448.17 ml   Filed Weights   08/28/20 1609 08/29/20 0834  Weight: 75.2 kg 73.4 kg    Examination:  General exam: Appears comfortable  Respiratory system: clear breath sounds b/l  Cardiovascular system: S1/S2+. No rubs or clicks  Gastrointestinal system: Abd is soft, NT, ND & hypoactive bowel sounds  Central nervous system: Alert and oriented. Moves all 4 extremities   Skin: diffuse papular eruption on back, chest, perivaginal abruption Psychiatry: Judgement and insight appear normal. Flat mood and affect     Data Reviewed: I have personally reviewed following labs and imaging studies  CBC: Recent Labs  Lab 08/28/20 1728 08/29/20 0556  WBC 4.1 4.4  NEUTROABS 2.0  --   HGB 11.3* 12.1  HCT 32.8* 34.7*  MCV 92.4 93.3  PLT 230 599   Basic Metabolic Panel: Recent Labs  Lab 08/28/20 1728 08/28/20 1842 08/29/20 0556  NA 136  --  136  K 3.0*  --  4.2  CL 107  --  109  CO2 24  --  23  GLUCOSE 156*  --  148*  BUN 10  --  7  CREATININE 0.77  --  0.72  CALCIUM 8.8*  --  8.4*  MG  --  2.0  --    GFR: Estimated Creatinine Clearance: 89.1 mL/min (by C-G formula based on SCr of 0.72 mg/dL). Liver Function Tests: Recent Labs  Lab 08/28/20 1728  AST 23  ALT 17  ALKPHOS 82  BILITOT 0.6  PROT 7.1  ALBUMIN 3.6   No results for input(s): LIPASE, AMYLASE in the last 168 hours. No results for input(s): AMMONIA in the last 168 hours. Coagulation Profile: Recent Labs  Lab 08/28/20 1728  INR 1.0   Cardiac Enzymes: No results for input(s): CKTOTAL, CKMB, CKMBINDEX, TROPONINI in the last 168 hours. BNP (last 3 results) No results for input(s): PROBNP in the last 8760 hours. HbA1C: No results for input(s): HGBA1C in the last 72 hours. CBG: No results for  input(s): GLUCAP in the last 168 hours. Lipid Profile: No results for input(s): CHOL, HDL, LDLCALC, TRIG, CHOLHDL, LDLDIRECT in the last 72 hours. Thyroid Function Tests: No results for input(s): TSH, T4TOTAL, FREET4, T3FREE, THYROIDAB in the last 72 hours. Anemia Panel: No results for input(s): VITAMINB12, FOLATE, FERRITIN, TIBC, IRON, RETICCTPCT in the last 72 hours. Sepsis Labs: No results for input(s): PROCALCITON, LATICACIDVEN in the last 168 hours.  Recent Results (from the past 240 hour(s))  Wet prep, genital     Status: Abnormal   Collection Time: 08/28/20  6:42 PM   Specimen: Cervical/Vaginal swab  Result Value  Ref Range Status   Yeast Wet Prep HPF POC NONE SEEN NONE SEEN Final   Trich, Wet Prep NONE SEEN NONE SEEN Final   Clue Cells Wet Prep HPF POC NONE SEEN NONE SEEN Final   WBC, Wet Prep HPF POC FEW (A) NONE SEEN Final   Sperm NONE SEEN  Final    Comment: Performed at [facility], [address] [place], [place] [address]  Chlamydia/NGC rt PCR [facility] only)     Status: None   Collection Time: 08/28/20  6:42 PM   Specimen: Cervical/Vaginal swab  Result Value Ref Range Status   Specimen source GC/Chlam ENDOCERVICAL  Final   Chlamydia Tr NOT DETECTED NOT DETECTED Final   N gonorrhoeae NOT DETECTED NOT DETECTED Final    Comment: (NOTE) This CT/NG assay has not been evaluated in patients with a history of  hysterectomy. Performed at [facility], [address] [place], [place] [address]   Resp Panel by RT-PCR (Flu A&B, Covid) Nasopharyngeal Swab     Status: None   Collection Time: 08/28/20  7:45 PM   Specimen: Nasopharyngeal Swab; Nasopharyngeal(NP) swabs in vial transport medium  Result Value Ref Range Status   SARS Coronavirus 2 by RT PCR NEGATIVE NEGATIVE Final    Comment: (NOTE) SARS-CoV-2 target nucleic acids are NOT DETECTED.  The SARS-CoV-2 RNA is generally detectable in upper respiratory specimens during the acute phase of infection.  The lowest concentration of SARS-CoV-2 viral copies this assay can detect is 138 copies/mL. A negative result does not preclude SARS-Cov-2 infection and should not be used as the sole basis for treatment or other patient management decisions. A negative result may occur with  improper specimen collection/handling, submission of specimen other than nasopharyngeal swab, presence of viral mutation(s) within the areas targeted by this assay, and inadequate number of viral copies(<138 copies/mL). A negative result must be combined with clinical observations, patient history, and epidemiological information. The expected result is Negative.  Fact Sheet for Patients:  EntrepreneurPulse.com.au  Fact Sheet for Healthcare Providers:  IncredibleEmployment.be  This test is no t yet approved or cleared by the [place] FDA and  has been authorized for detection and/or diagnosis of SARS-CoV-2 by FDA under an Emergency Use Authorization (EUA). This EUA will remain  in effect (meaning this test can be used) for the duration of the COVID-19 declaration under Section 564(b)(1) of the Act, 21 U.S.C.section 360bbb-3(b)(1), unless the authorization is terminated  or revoked sooner.       Influenza A by PCR NEGATIVE NEGATIVE Final   Influenza B by PCR NEGATIVE NEGATIVE Final    Comment: (NOTE) The Xpert Xpress SARS-CoV-2/FLU/RSV plus assay is intended as an aid in the diagnosis of influenza from Nasopharyngeal swab specimens and should not be used as a sole basis for treatment. Nasal washings and aspirates are unacceptable for Xpert Xpress SARS-CoV-2/FLU/RSV testing.  Fact Sheet for Patients: EntrepreneurPulse.com.au  Fact Sheet for Healthcare Providers: IncredibleEmployment.be  This test is not yet approved or cleared by the [place] FDA and has been authorized for detection and/or diagnosis of SARS-CoV-2 by FDA  under an Emergency Use Authorization (EUA). This EUA will remain in effect (meaning this test can be used) for the duration of the COVID-19 declaration under Section 564(b)(1) of the Act, 21 U.S.C. section 360bbb-3(b)(1), unless the authorization is terminated or revoked.  Performed at [facility], [address] [place], [place] [address]          Radiology Studies: No  results found.      Scheduled Meds: . vitamin C  250 mg Oral Daily  . diphenhydrAMINE  25 mg Oral Q6H  . doxycycline  100 mg Oral Q12H  . enoxaparin (LOVENOX) injection  40 mg Subcutaneous Q24H  . tamoxifen  20 mg Oral Daily  . venlafaxine XR  37.5 mg Oral Q breakfast   Continuous Infusions:    LOS: 2 days    Time spent: 31 mins     Wyvonnia Dusky, MD [facility] Pager [phone]  If 7PM-7AM, please contact night-coverage 08/30/2020, 8:12 AM \

## 2020-08-30 NOTE — Progress Notes (Signed)
   Date of Admission:  08/28/2020       Subjective: Says the itching of her skin is better Tolerated amoxicillin oral challenge  Medications:  . amoxicillin  500 mg Oral Once  . vitamin C  250 mg Oral Daily  . diphenhydrAMINE  25 mg Oral Q6H  . doxycycline  100 mg Oral Q12H  . enoxaparin (LOVENOX) injection  40 mg Subcutaneous Q24H  . tamoxifen  20 mg Oral Daily  . venlafaxine XR  37.5 mg Oral Q breakfast    Objective: Vital signs in last 24 hours: Temp:  [98 F (36.7 C)-98.8 F (37.1 C)] 98.8 F (37.1 C) (06/08 0813) Pulse Rate:  [47-57] 55 (06/08 0813) Resp:  [16-18] 18 (06/08 0813) BP: (102-125)/(63-78) 102/68 (06/08 0813) SpO2:  [99 %-100 %] 100 % (06/08 0813)  PHYSICAL EXAM:  General: Alert, cooperative,  Throat inflammation and ulceration over tonsils Neck: Supple, symmetrical, no adenopathy, thyroid: non tender no carotid bruit and no JVD. Back: No CVA tenderness. Lungs: Clear to auscultation bilaterally. No Wheezing or Rhonchi. No rales. Heart: Regular rate and rhythm, no murmur, rub or gallop. Abdomen: Soft, non-tender,not distended. Bowel sounds normal. No masses Extremities: atraumatic, no cyanosis. No edema. No cl Skin: maculopapular eruption Lymph: Cervical, supraclavicular normal. Neurologic: Grossly non-focal  Lab Results Recent Labs    08/28/20 1728 08/29/20 0556  WBC 4.1 4.4  HGB 11.3* 12.1  HCT 32.8* 34.7*  NA 136 136  K 3.0* 4.2  CL 107 109  CO2 24 23  BUN 10 7  CREATININE 0.77 0.72   Liver Panel Recent Labs    08/28/20 1728  PROT 7.1  ALBUMIN 3.6  AST 23  ALT 17  ALKPHOS 82  BILITOT 0.6   Microbiology:  Studies/Results: No results found.   Assessment/Plan: Secondary syphilis with skin, oral cavity and genital lesions RPR 1:32 ( TPA pending)   PCN allergy noted but she has tolerated amoxicillin before- tolerated amoxicillin challenge otday - so will give Benzathine penicillin 2.4 million units  Can DC doxy Discussed  the management with patient and hospitalist Follow up as Op with me

## 2020-08-30 NOTE — Plan of Care (Signed)
  Problem: Education: Goal: Knowledge of General Education information will improve Description: Including pain rating scale, medication(s)/side effects and non-pharmacologic comfort measures Outcome: Progressing   Problem: Clinical Measurements: Goal: Will remain free from infection Outcome: Progressing   Problem: Pain Managment: Goal: General experience of comfort will improve Outcome: Progressing   

## 2020-08-31 ENCOUNTER — Other Ambulatory Visit: Payer: Self-pay | Admitting: Internal Medicine

## 2020-08-31 DIAGNOSIS — Z170 Estrogen receptor positive status [ER+]: Secondary | ICD-10-CM

## 2020-08-31 LAB — CBC
HCT: 36.5 % (ref 36.0–46.0)
Hemoglobin: 12.6 g/dL (ref 12.0–15.0)
MCH: 31.7 pg (ref 26.0–34.0)
MCHC: 34.5 g/dL (ref 30.0–36.0)
MCV: 91.9 fL (ref 80.0–100.0)
Platelets: 271 10*3/uL (ref 150–400)
RBC: 3.97 MIL/uL (ref 3.87–5.11)
RDW: 13.3 % (ref 11.5–15.5)
WBC: 4.3 10*3/uL (ref 4.0–10.5)
nRBC: 0 % (ref 0.0–0.2)

## 2020-08-31 LAB — BASIC METABOLIC PANEL
Anion gap: 6 (ref 5–15)
BUN: 10 mg/dL (ref 6–20)
CO2: 23 mmol/L (ref 22–32)
Calcium: 8.6 mg/dL — ABNORMAL LOW (ref 8.9–10.3)
Chloride: 106 mmol/L (ref 98–111)
Creatinine, Ser: 0.8 mg/dL (ref 0.44–1.00)
GFR, Estimated: >60 mL/min (ref >60)
Glucose, Bld: 91 mg/dL (ref 70–99)
Potassium: 3.8 mmol/L (ref 3.5–5.1)
Sodium: 135 mmol/L (ref 135–145)

## 2020-08-31 NOTE — Care Management Important Message (Signed)
Important Message  Patient Details  Name: Carol Crawford MRN: [id number] Date of Birth: 1975/09/01   Medicare Important Message Given:  Yes     Dannette Barbara 08/31/2020, 1:01 PM

## 2020-08-31 NOTE — Progress Notes (Signed)
Patient received a lovenox dose this morning. I explained to patient the indications for the lovenox and where it will be given. I let the patient know I was applying alcohol and notified her when I was injecting the needle. While performing the injection the patient begin yelling and cursing. Patient could be heard throughout the the unit, that another staff member came in running to investigate. I apologize for causing pain and asked the patient is there anything else I can get for her. Patient turned her head and ignored me.

## 2020-08-31 NOTE — Discharge Summary (Signed)
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    Discharge Condition: Stable  Diet recommendation: Resume previous diet  Vitals:   08/31/20 0726 08/31/20 1147  BP: 98/65 106/69  Pulse: 64 64  Resp: 20 18  Temp: 98.2 F (36.8 C) 97.9 F (36.6 C)  SpO2: 100% 99%    History of present illness:  Carol Crawford is a 45 y.o. female with medical history significant for bipolar disorder, ADHD, GERD, right breast cancer status postlumpectomy, radiotherapy, on tamoxifen, schizophrenia, CVA, who presented to the emergency room with a [place] of diffuse body rash.  The patient was given p.o. clindamycin on 5/22 at [facility] for acute pharyngitis and was expected to take it for 10 days.  For the last 7 days she has been having papular eruption throughout her body and involving her face.  For the last 3 days she had mucosal involvement in her vaginal with significant vaginal itching and mild dysuria.  She denies any fever or chills.  No nausea or vomiting or abdominal pain.  No tongue swelling or neck swelling.  No recent new lotions or detergents or soaps or other medications.  ED Course: Upon presenting to the ER, blood pressure was 131/92 with a temperature of 100.3 and otherwise normal vital signs.  Labs revealed hypokalemia at 3 mild anemia.  Influenza antigens and COVID-19 PCR came back negative.  She had a wet prep that showed few WBCs.   The patient was given IV Benadryl and Pepcid and I added IV Solu-Medrol for suspected adverse drug reaction to clindamycin.  For concern about  Stevens-Johnson syndrome with mucosal involvement [facility] burn center was called and requested the punch biopsy before excepting transfer.  There were no other beds available at [facility].  Dermatology consult was requested and is currently pending.  The patient will be admitted to a progressive unit bed for further evaluation and management.   Hospital course from Dr. Jimmye Norman 6/7-08/30/20: Pt was initially thought to have allergic/ADR to clindamycin use but pt's RPR was positive. Pt has a rash that involves skin, genitalia & mucosal involvement and likely secondary to secondary to syphilis as per ID. Pt was started on amoxicillin today. Of note, pt has had 1 partner for 15 years and has discussed these new findings with her partner already. Can likely be d/c tomorrow as pt needs to get benzathine penicillin G as per ID. Hx of penicillin allergy.  She has received PCN challenge and has received 2.4 million units x 1 dose.  Will follow up outpatient with ID.    Hospital Course:  Active Problems:   Stevens-Johnson disease ([place])  Secondary syphilis:  as per ID w/ involvement on skin, mucosal surfaces & genitalia. Steroids were d/c. RPR was reactive & confirmatory testing is pending. Gonorrhea/chlamydia were neg. HIV is non-reactive. One sexual partner for 15 years and pt has discussed w/ her partner already   She has received PCN challenge and has received 2.4 million units x 1 dose.  Will follow up outpatient with ID.   Acute pharyngitis:  resolving  Hx of breast cancer:  s/p lumpectomy & radiation. Continue on home dose of tamoxifen    Depression:  W/ hx of bipolar disorder.  Continue on home dose of effexor    Hyperglycemia:  no hx of DM. Likely secondary to steroid use this admission.  Trending down      Code Status: full        Consultants:   ID   Procedures:   Antimicrobials:    Discharge Exam: BP 106/69 (BP Location: Right Arm)   Pulse 64   Temp 97.9 F (36.6 C)    Resp 18   Ht 5\' 7"  (1.702 m)   Wt 73.4 kg   SpO2 99%   BMI 25.34 kg/m  General: 45 y.o. year-old female well developed well nourished in no acute distress.  Alert and oriented x3. Cardiovascular: Regular rate and rhythm with no rubs or gallops.  No thyromegaly or JVD noted.   Respiratory: Clear to auscultation with no wheezes or rales. Good inspiratory effort. Abdomen: Soft nontender nondistended with normal bowel sounds x4 quadrants. Musculoskeletal: No lower extremity edema. 2/4 pulses in all 4 extremities. Skin: No ulcerative lesions noted or rashes, Psychiatry: Mood is appropriate for condition and setting  Discharge Instructions You were cared for by a hospitalist during your hospital stay. If you have any questions about your discharge medications or the care you received while you were in the hospital after you are discharged, you can call the unit and asked to speak with the hospitalist on call if the hospitalist that took care of you is not available. Once you are discharged, your primary care physician will handle any further medical issues. Please note that NO REFILLS for any discharge medications will be authorized once you are discharged, as it is imperative that you return to your primary care physician (or establish a relationship with a primary care physician if you do not have one) for your aftercare needs so that they can reassess your need for medications and monitor your lab values.   Allergies as of 08/31/2020       Reactions   Shrimp [shellfish Allergy] Hives   Tomato Rash        Medication List     STOP taking these medications    chlorhexidine 0.12 % solution Commonly known as: PERIDEX   mupirocin ointment 2 % Commonly known as: BACTROBAN   nicotine 7 mg/24hr patch Commonly known as: NICODERM CQ - dosed in mg/24 hr   venlafaxine XR 37.5 MG 24 hr capsule Commonly known as: EFFEXOR-XR       TAKE these medications    acetaminophen 500 MG  tablet Commonly known as: TYLENOL Take 500 mg by mouth every 6 (six) hours as needed for moderate pain or mild pain.   diphenhydrAMINE 25 mg capsule Commonly known as: BENADRYL Take 25 mg by mouth every 6 (six) hours as needed for itching.   ibuprofen 600 MG tablet Commonly known as: ADVIL Take 600 mg by mouth every 6 (six) hours as needed for headache or moderate pain.   tamoxifen 20 MG tablet Commonly known as: NOLVADEX Take 1 tablet (20 mg total) by mouth daily.   VITAMIN C PO Take by mouth.       Allergies  Allergen Reactions   Shrimp [Shellfish Allergy] Hives   Tomato Rash    Follow-up Information     Tsosie Billing, MD. Call today.   Specialty: Infectious Diseases Why: Please call for a posthospital follow-up appointment. Contact information:  [place] [address] [phone]                  The results of significant diagnostics from this hospitalization (including imaging, microbiology, ancillary and laboratory) are listed below for reference.    Significant Diagnostic Studies: No results found.  Microbiology: Recent Results (from the past 240 hour(s))  Wet prep, genital     Status: Abnormal   Collection Time: 08/28/20  6:42 PM   Specimen: Cervical/Vaginal swab  Result Value Ref Range Status   Yeast Wet Prep HPF POC NONE SEEN NONE SEEN Final   Trich, Wet Prep NONE SEEN NONE SEEN Final   Clue Cells Wet Prep HPF POC NONE SEEN NONE SEEN Final   WBC, Wet Prep HPF POC FEW (A) NONE SEEN Final   Sperm NONE SEEN  Final    Comment: Performed at [facility], [address] [place], [place] [address]  [place] rt PCR [facility] only)     Status: None   Collection Time: 08/28/20  6:42 PM   Specimen: Cervical/Vaginal swab  Result Value Ref Range Status   Specimen source GC/Chlam ENDOCERVICAL  Final   Chlamydia Tr NOT DETECTED NOT DETECTED Final   N gonorrhoeae NOT DETECTED NOT DETECTED Final    Comment:  (NOTE) This CT/NG assay has not been evaluated in patients with a history of  hysterectomy. Performed at [facility], [address] [place], [place] [address]   Resp Panel by RT-PCR (Flu A&B, Covid) Nasopharyngeal Swab     Status: None   Collection Time: 08/28/20  7:45 PM   Specimen: Nasopharyngeal Swab; Nasopharyngeal(NP) swabs in vial transport medium  Result Value Ref Range Status   SARS Coronavirus 2 by RT PCR NEGATIVE NEGATIVE Final    Comment: (NOTE) SARS-CoV-2 target nucleic acids are NOT DETECTED.  The SARS-CoV-2 RNA is generally detectable in upper respiratory specimens during the acute phase of infection. The lowest concentration of SARS-CoV-2 viral copies this assay can detect is 138 copies/mL. A negative result does not preclude SARS-Cov-2 infection and should not be used as the sole basis for treatment or other patient management decisions. A negative result may occur with  improper specimen collection/handling, submission of specimen other than nasopharyngeal swab, presence of viral mutation(s) within the areas targeted by this assay, and inadequate number of viral copies(<138 copies/mL). A negative result must be combined with clinical observations, patient history, and epidemiological information. The expected result is Negative.  Fact Sheet for Patients:  EntrepreneurPulse.com.au  Fact Sheet for Healthcare Providers:  IncredibleEmployment.be  This test is no t yet approved or cleared by the [place] FDA and  has been authorized for detection and/or diagnosis of SARS-CoV-2 by FDA under an Emergency Use Authorization (EUA). This EUA will remain  in effect (meaning this test can be used) for the duration of the COVID-19 declaration under Section 564(b)(1) of the Act, 21 U.S.C.section 360bbb-3(b)(1), unless the authorization is terminated  or revoked sooner.       Influenza A by PCR NEGATIVE NEGATIVE Final    Influenza B by PCR NEGATIVE NEGATIVE Final    Comment: (NOTE) The Xpert Xpress SARS-CoV-2/FLU/RSV plus assay is intended as an aid in the diagnosis of influenza from Nasopharyngeal swab specimens and should not be used as a sole basis for treatment. Nasal washings and aspirates are unacceptable for Xpert Xpress SARS-CoV-2/FLU/RSV testing.  Fact Sheet for Patients: EntrepreneurPulse.com.au  Fact Sheet for Healthcare Providers: IncredibleEmployment.be  This test is not  yet approved or cleared by the [place] and has been authorized for detection and/or diagnosis of SARS-CoV-2 by FDA under an Emergency Use Authorization (EUA). This EUA will remain in effect (meaning this test can be used) for the duration of the COVID-19 declaration under Section 564(b)(1) of the Act, 21 U.S.C. section 360bbb-3(b)(1), unless the authorization is terminated or revoked.  Performed at [facility], [address] [place], [place] [address]      Labs: Basic Metabolic Panel: Recent Labs  Lab 08/28/20 1728 08/28/20 1842 08/29/20 0556 08/31/20 0714  NA 136  --  136 135  K 3.0*  --  4.2 3.8  CL 107  --  109 106  CO2 24  --  23 23  GLUCOSE 156*  --  148* 91  BUN 10  --  7 10  CREATININE 0.77  --  0.72 0.80  CALCIUM 8.8*  --  8.4* 8.6*  MG  --  2.0  --   --    Liver Function Tests: Recent Labs  Lab 08/28/20 1728  AST 23  ALT 17  ALKPHOS 82  BILITOT 0.6  PROT 7.1  ALBUMIN 3.6   No results for input(s): LIPASE, AMYLASE in the last 168 hours. No results for input(s): AMMONIA in the last 168 hours. CBC: Recent Labs  Lab 08/28/20 1728 08/29/20 0556 08/31/20 0714  WBC 4.1 4.4 4.3  NEUTROABS 2.0  --   --   HGB 11.3* 12.1 12.6  HCT 32.8* 34.7* 36.5  MCV 92.4 93.3 91.9  PLT 230 230 271   Cardiac Enzymes: No results for input(s): CKTOTAL, CKMB, CKMBINDEX, TROPONINI in the last 168 hours. BNP: BNP (last 3 results) No results for  input(s): BNP in the last 8760 hours.  ProBNP (last 3 results) No results for input(s): PROBNP in the last 8760 hours.  CBG: No results for input(s): GLUCAP in the last 168 hours.     Signed:  Kayleen Memos, MD [facility] 08/31/2020, 6:27 PM

## 2020-08-31 NOTE — Progress Notes (Signed)
[name] to be D/C'd Home per MD order.  Discussed prescriptions and follow up appointments with the patient. NO Prescriptions given to patient, medication list explained in detail. Pt verbalized understanding. Epi pen given to patient, patient aware of how to use pen.  Allergies as of 08/31/2020       Reactions   Shrimp [shellfish Allergy] Hives   Tomato Rash        Medication List     STOP taking these medications    chlorhexidine 0.12 % solution Commonly known as: PERIDEX   mupirocin ointment 2 % Commonly known as: BACTROBAN   nicotine 7 mg/24hr patch Commonly known as: NICODERM CQ - dosed in mg/24 hr   venlafaxine XR 37.5 MG 24 hr capsule Commonly known as: EFFEXOR-XR       TAKE these medications    acetaminophen 500 MG tablet Commonly known as: TYLENOL Take 500 mg by mouth every 6 (six) hours as needed for moderate pain or mild pain.   diphenhydrAMINE 25 mg capsule Commonly known as: BENADRYL Take 25 mg by mouth every 6 (six) hours as needed for itching.   ibuprofen 600 MG tablet Commonly known as: ADVIL Take 600 mg by mouth every 6 (six) hours as needed for headache or moderate pain.   tamoxifen 20 MG tablet Commonly known as: NOLVADEX Take 1 tablet (20 mg total) by mouth daily.   VITAMIN C PO Take by mouth.        Vitals:   08/31/20 0726 08/31/20 1147  BP: 98/65 106/69  Pulse: 64 64  Resp: 20 18  Temp: 98.2 F (36.8 C) 97.9 F (36.6 C)  SpO2: 100% 99%    Skin clean, dry and intact without evidence of skin break down, no evidence of skin tears noted. IV catheter discontinued intact. Site without signs and symptoms of complications. Dressing and pressure applied. Pt denies pain at this time. No complaints noted.  An After Visit Summary was printed and given to the patient. Patient escorted self without notifying staff of departure and D/C home via private auto.  Carol Crawford

## 2020-09-11 ENCOUNTER — Telehealth: Payer: Self-pay

## 2020-09-11 NOTE — Telephone Encounter (Signed)
Received voicemail from patient requesting call back. Called patient back, no answer. Left HIPAA compliant voicemail requesting callback.   Beryle Flock, RN

## 2020-09-14 ENCOUNTER — Ambulatory Visit: Payer: Self-pay | Admitting: *Deleted

## 2020-09-14 NOTE — Telephone Encounter (Signed)
Summary: Ear Pain   Patient experiencing left ear pain which is radiating towards jaw. Patient will have her tonsils  removed on 09/21/2020. Currently taking Tylenol,  ibuprofen 900MG , Advil 200MG , ear wax, oral gel mouth wash which is given temporary relief . Patient was hospitalized for syphilis from June 6th to the 9th and caught the possible ear infection while in the hospital paitent states no treatment was given for the ear infection. Scheduled patient a NPA with Dr. Parks Ranger on 09/18/2020 seeking clinical advise regarding ear pain prior to appointment.      Reason for Disposition  [1] SEVERE pain AND [2] not improved 2 hours after taking analgesic medication (e.g., ibuprofen or acetaminophen)  Answer Assessment - Initial Assessment Questions 1. LOCATION: "Which ear is involved?"     left 2. ONSET: "When did the ear start hurting"      08/05/20- + COVID 3. SEVERITY: "How bad is the pain?"  (Scale 1-10; mild, moderate or severe)   - MILD (1-3): doesn't interfere with normal activities    - MODERATE (4-7): interferes with normal activities or awakens from sleep    - SEVERE (8-10): excruciating pain, unable to do any normal activities      severe 4. URI SYMPTOMS: "Do you have a runny nose or cough?"     Mouth- left 5. FEVER: "Do you have a fever?" If Yes, ask: "What is your temperature, how was it measured, and when did it start?"     unsure 6. CAUSE: "Have you been swimming recently?", "How often do you use Q-TIPS?", "Have you had any recent air travel or scuba diving?"     no 7. OTHER SYMPTOMS: "Do you have any other symptoms?" (e.g., headache, stiff neck, dizziness, vomiting, runny nose, decreased hearing)     Decreased hearing, sharp pain and burning in vagina 8. PREGNANCY: "Is there any chance you are pregnant?" "When was your last menstrual period?"     N/a  Protocols used: Bethann Punches

## 2020-09-18 ENCOUNTER — Other Ambulatory Visit: Payer: Self-pay

## 2020-09-18 ENCOUNTER — Ambulatory Visit (INDEPENDENT_AMBULATORY_CARE_PROVIDER_SITE_OTHER): Payer: Medicare HMO | Admitting: Family Medicine

## 2020-09-18 ENCOUNTER — Encounter: Payer: Self-pay | Admitting: Family Medicine

## 2020-09-18 VITALS — BP 105/70 | HR 71 | Ht 67.0 in | Wt 160.6 lb

## 2020-09-18 DIAGNOSIS — F331 Major depressive disorder, recurrent, moderate: Secondary | ICD-10-CM | POA: Diagnosis not present

## 2020-09-18 DIAGNOSIS — Z7689 Persons encountering health services in other specified circumstances: Secondary | ICD-10-CM | POA: Diagnosis not present

## 2020-09-18 DIAGNOSIS — H66012 Acute suppurative otitis media with spontaneous rupture of ear drum, left ear: Secondary | ICD-10-CM

## 2020-09-18 DIAGNOSIS — F209 Schizophrenia, unspecified: Secondary | ICD-10-CM

## 2020-09-18 DIAGNOSIS — C50411 Malignant neoplasm of upper-outer quadrant of right female breast: Secondary | ICD-10-CM

## 2020-09-18 DIAGNOSIS — J432 Centrilobular emphysema: Secondary | ICD-10-CM | POA: Insufficient documentation

## 2020-09-18 DIAGNOSIS — Z720 Tobacco use: Secondary | ICD-10-CM

## 2020-09-18 DIAGNOSIS — Z170 Estrogen receptor positive status [ER+]: Secondary | ICD-10-CM

## 2020-09-18 MED ORDER — SPIRIVA RESPIMAT 2.5 MCG/ACT IN AERS: 2.0000 | INHALATION_SPRAY | Freq: Every day | RESPIRATORY_TRACT | 5 refills | Status: DC

## 2020-09-18 MED ORDER — ALBUTEROL SULFATE HFA 108 (90 BASE) MCG/ACT IN AERS: 2.0000 | INHALATION_SPRAY | RESPIRATORY_TRACT | 3 refills | Status: DC | PRN

## 2020-09-18 MED ORDER — NICOTINE 14 MG/24HR TD PT24: 14.0000 mg | MEDICATED_PATCH | Freq: Every day | TRANSDERMAL | 0 refills | Status: DC

## 2020-09-18 MED ORDER — QUETIAPINE FUMARATE 100 MG PO TABS: 100.0000 mg | ORAL_TABLET | Freq: Every day | ORAL | 1 refills | Status: DC

## 2020-09-18 MED ORDER — AMOXICILLIN-POT CLAVULANATE 875-125 MG PO TABS: 1.0000 | ORAL_TABLET | Freq: Two times a day (BID) | ORAL | 0 refills | Status: DC

## 2020-09-18 MED ORDER — TRAZODONE HCL 50 MG PO TABS: 25.0000 mg | ORAL_TABLET | Freq: Every day | ORAL | 1 refills | Status: DC

## 2020-09-18 NOTE — Progress Notes (Signed)
Subjective:    Patient ID: Carol Crawford, female    DOB: [birth year], 45 y.o.   MRN: [id number]  Carol Crawford is a 45 y.o. female presenting on 09/18/2020 for Establish Care   HPI  Specialists:  Breast Cancer Stage 1 1 year survivor On medication Tamoxifen 20mg  daily.  Depression, Bipolar, Schizophrenia, Anxiety No longer followed by Psychiatry Off of medication for past 1 year. Considering restarting medication for depression - She was on Trazodone 50mg , Seroquel 300mg , Effexor. - History of substance use in past. No longer using substances now. Rare alcohol intake only situational but no longer regularly  Centrilobular Emphysema (COPD) Tobacco Abuse Chronic smoker Ready to quit down to 6 cigs daily now from 1ppd in past. Quit before w/ NRT patches Request repeat order. Not on inhalers.  Hospital Follow-up Generalized Rash, dx with Secondary Syphilis with rash - treated with PCN and has ID apt tomorrow She felt Left ear had fluid in it, and tried ear drops from [facility] that it helped clear it up Now has some "popping" in Left ear. Triggering headaches on Left side. Causing irritation in gums and mouth.  Chart review does not confirm Steven's Johnson Syndrome as it was listed in the chart.   Depression screen [facility] 2/9 09/18/2020 09/17/2019 09/17/2019  Decreased Interest 2 3 3   Down, Depressed, Hopeless 2 2 2   PHQ - 2 Score 4 5 5   Altered sleeping 2 3 -  Tired, decreased energy 3 3 -  Change in appetite 2 2 -  Feeling bad or failure about yourself  1 0 -  Trouble concentrating 2 0 -  Moving slowly or fidgety/restless 1 1 -  Suicidal thoughts 1 0 -  PHQ-9 Score 16 14 -  Difficult doing work/chores Very difficult Very difficult -    Past Medical History:  Diagnosis Date   ADHD    Bipolar 1 disorder ([place])    Breast cancer ([place])    currently in treatment for   Breast disorder    right breast cancer   COPD (chronic obstructive pulmonary disease) ([place])     Depressed    Drug overdose 2010   Family history of breast cancer    Family history of colon cancer    Family history of prostate cancer    GERD (gastroesophageal reflux disease)    OCC   Headache    MIGRAINES (None for 2 months)   History of MRSA infection    foot infection   Personal history of radiation therapy 01/28/2019   right breast   Schizophrenia ([place])    Stroke ([place])    after foot surgery, left side was numb, fully recovered   Past Surgical History:  Procedure Laterality Date   BREAST BIOPSY Right 10/21/2018   Affirm Bx-"X" clip-path pending   BREAST LUMPECTOMY Right 12/02/2018   malignant   BREAST LUMPECTOMY WITH RADIOACTIVE SEED AND SENTINEL LYMPH NODE BIOPSY Right 12/02/2018   Procedure: RIGHT BREAST LUMPECTOMY WITH RADIOACTIVE SEED AND SENTINEL LYMPH NODE BIOPSY;  Surgeon: Jovita Kussmaul, MD;  Location: [place];  Service: General;  Laterality: Right;   INCISION AND DRAINAGE Left 01/09/2018   Procedure: INCISION AND DRAINAGE-below fascia foot; multiple;  Surgeon: [name], DPM;  Location: [facility];  Service: Podiatry;  Laterality: Left;   INCISION AND DRAINAGE Left 04/17/2018   Procedure: I&D; BELOW FASCIA FOOT; MULTIPLE;  Surgeon: [name], DPM;  Location: [facility];  Service: Podiatry;  Laterality: Left;   INCISION AND  DRAINAGE Right 12/18/2018   Procedure: PLACEMENT OF DRAIN IN RIGHT AXILLA;  Surgeon: Jovita Kussmaul, MD;  Location: [place];  Service: General;  Laterality: Right;   INCISION AND DRAINAGE Left 04/12/2020   Procedure: INCISION AND DRAINAGE BELOW FASCIA FOOT, SINGLE LEFT;  Surgeon: [name], DPM;  Location: [place];  Service: Podiatry;  Laterality: Left;  leave first for transportation   [place] Left 09/05/2017   Procedure: REPAIR EXTENSOR TENDON;  Surgeon: [name], DPM;  Location: [facility];  Service: Podiatry;  Laterality: Left;   Social History   Socioeconomic History   Marital status:  Single    Spouse name: Not on file   Number of children: Not on file   Years of education: Not on file   Highest education level: Not on file  Occupational History   Not on file  Tobacco Use   Smoking status: Some Days    Packs/day: 0.25    Years: 30.00    Pack years: 7.50    Types: Cigarettes   Smokeless tobacco: Never   Tobacco comments:    6 cigs daily  Vaping Use   Vaping Use: Former   Start date: 08/22/2017  Substance and Sexual Activity   Alcohol use: Not Currently    Comment: occasionally   Drug use: Yes    Types: Marijuana    Comment: 1-2x week   Sexual activity: Yes  Other Topics Concern   Not on file  Social History Narrative   Not on file   Social Determinants of Health   Financial Resource Strain: Not on file  Food Insecurity: Not on file  Transportation Needs: Not on file  Physical Activity: Not on file  Stress: Not on file  Social Connections: Not on file  Intimate Partner Violence: Not on file   Family History  Problem Relation Age of Onset   Arthritis Mother    Hypertension Mother    Cervical cancer Mother    Prostate cancer Father    Lung cancer Father    Diabetes Paternal Aunt    Breast cancer Paternal Aunt    ADD / ADHD Daughter    Bipolar disorder Daughter    Hypertension Maternal Grandmother    Stroke Maternal Grandmother    Prostate cancer Maternal Grandfather    Hypertension Maternal Grandfather    Colon cancer Maternal Grandfather    Hypertension Paternal Grandfather    Stroke Paternal Grandfather    Breast cancer Cousin    Current Outpatient Medications on File Prior to Visit  Medication Sig   acetaminophen (TYLENOL) 500 MG tablet Take 500 mg by mouth every 6 (six) hours as needed for moderate pain or mild pain.   Ascorbic Acid (VITAMIN C PO) Take by mouth.   diphenhydrAMINE (BENADRYL) 25 mg capsule Take 25 mg by mouth every 6 (six) hours as needed for itching.   ibuprofen (ADVIL,MOTRIN) 600 MG tablet Take 600 mg by mouth every  6 (six) hours as needed for headache or moderate pain.    tamoxifen (NOLVADEX) 20 MG tablet Take 1 tablet (20 mg total) by mouth daily.   No current facility-administered medications on file prior to visit.    Review of Systems Per HPI unless specifically indicated above      Objective:    BP 105/70   Pulse 71   Ht 5\' 7"  (1.702 m)   Wt 160 lb 9.6 oz (72.8 kg)   SpO2 100%   BMI 25.15 kg/m   Wt  Readings from Last 3 Encounters:  09/18/20 160 lb 9.6 oz (72.8 kg)  08/29/20 161 lb 12.8 oz (73.4 kg)  07/26/20 165 lb 12.8 oz (75.2 kg)    Physical Exam Vitals and nursing note reviewed.  Constitutional:      General: She is not in acute distress.    Appearance: She is well-developed. She is not diaphoretic.     Comments: Well-appearing, comfortable, cooperative  HENT:     Head: Normocephalic and atraumatic.     Right Ear: Ear canal and external ear normal. There is no impacted cerumen.     Ears:     Comments: R Tm effusion with opaque cloudy without bulging.  L ear canal has some erythema, and purulent drainage cannot view TM Eyes:     General:        Right eye: No discharge.        Left eye: No discharge.     Conjunctiva/sclera: Conjunctivae normal.  Neck:     Thyroid: No thyromegaly.  Cardiovascular:     Rate and Rhythm: Normal rate and regular rhythm.     Heart sounds: Normal heart sounds. No murmur heard. Pulmonary:     Effort: Pulmonary effort is normal. No respiratory distress.     Breath sounds: Normal breath sounds. No wheezing or rales.  Musculoskeletal:        General: Normal range of motion.     Cervical back: Normal range of motion and neck supple.  Lymphadenopathy:     Cervical: No cervical adenopathy.  Skin:    General: Skin is warm and dry.     Findings: No erythema or rash.  Neurological:     Mental Status: She is alert and oriented to person, place, and time.  Psychiatric:        Behavior: Behavior normal.     Comments: Well groomed, good eye  contact, normal speech and thoughts     Results for orders placed or performed during the hospital encounter of 08/28/20  Wet prep, genital   Specimen: Cervical/Vaginal swab  Result Value Ref Range   Yeast Wet Prep HPF POC NONE SEEN NONE SEEN   Trich, Wet Prep NONE SEEN NONE SEEN   Clue Cells Wet Prep HPF POC NONE SEEN NONE SEEN   WBC, Wet Prep HPF POC FEW (A) NONE SEEN   Sperm NONE SEEN   Chlamydia/NGC rt PCR ([facility] only)   Specimen: Cervical/Vaginal swab  Result Value Ref Range   Specimen source GC/Chlam ENDOCERVICAL    Chlamydia Tr NOT DETECTED NOT DETECTED   N gonorrhoeae NOT DETECTED NOT DETECTED  Resp Panel by RT-PCR (Flu A&B, Covid) Nasopharyngeal Swab   Specimen: Nasopharyngeal Swab; Nasopharyngeal(NP) swabs in vial transport medium  Result Value Ref Range   SARS Coronavirus 2 by RT PCR NEGATIVE NEGATIVE   Influenza A by PCR NEGATIVE NEGATIVE   Influenza B by PCR NEGATIVE NEGATIVE  CBC with Differential  Result Value Ref Range   WBC 4.1 4.0 - 10.5 K/uL   RBC 3.55 (L) 3.87 - 5.11 MIL/uL   Hemoglobin 11.3 (L) 12.0 - 15.0 g/dL   HCT 32.8 (L) 36.0 - 46.0 %   MCV 92.4 80.0 - 100.0 fL   MCH 31.8 26.0 - 34.0 pg   MCHC 34.5 30.0 - 36.0 g/dL   RDW 13.1 11.5 - 15.5 %   Platelets 230 150 - 400 K/uL   nRBC 0.0 0.0 - 0.2 %   Neutrophils Relative % 48 %   Neutro  Abs 2.0 1.7 - 7.7 K/uL   Lymphocytes Relative 40 %   Lymphs Abs 1.7 0.7 - 4.0 K/uL   Monocytes Relative 9 %   Monocytes Absolute 0.4 0.1 - 1.0 K/uL   Eosinophils Relative 2 %   Eosinophils Absolute 0.1 0.0 - 0.5 K/uL   Basophils Relative 1 %   Basophils Absolute 0.0 0.0 - 0.1 K/uL   Immature Granulocytes 0 %   Abs Immature Granulocytes 0.01 0.00 - 0.07 K/uL  Comprehensive metabolic panel  Result Value Ref Range   Sodium 136 135 - 145 mmol/L   Potassium 3.0 (L) 3.5 - 5.1 mmol/L   Chloride 107 98 - 111 mmol/L   CO2 24 22 - 32 mmol/L   Glucose, Bld 156 (H) 70 - 99 mg/dL   BUN 10 6 - 20 mg/dL   Creatinine, Ser  0.77 0.44 - 1.00 mg/dL   Calcium 8.8 (L) 8.9 - 10.3 mg/dL   Total Protein 7.1 6.5 - 8.1 g/dL   Albumin 3.6 3.5 - 5.0 g/dL   AST 23 15 - 41 U/L   ALT 17 0 - 44 U/L   Alkaline Phosphatase 82 38 - 126 U/L   Total Bilirubin 0.6 0.3 - 1.2 mg/dL   GFR, Estimated >60 >60 mL/min   Anion gap 5 5 - 15  Protime-INR  Result Value Ref Range   Prothrombin Time 13.4 11.4 - 15.2 seconds   INR 1.0 0.8 - 1.2  APTT  Result Value Ref Range   aPTT 30 24 - 36 seconds  HIV Antibody (routine testing w rflx)  Result Value Ref Range   HIV Screen 4th Generation wRfx Non Reactive Non Reactive  RPR  Result Value Ref Range   RPR Ser Ql Reactive (A) NON REACTIVE   RPR Titer 5:03   Basic metabolic panel  Result Value Ref Range   Sodium 136 135 - 145 mmol/L   Potassium 4.2 3.5 - 5.1 mmol/L   Chloride 109 98 - 111 mmol/L   CO2 23 22 - 32 mmol/L   Glucose, Bld 148 (H) 70 - 99 mg/dL   BUN 7 6 - 20 mg/dL   Creatinine, Ser 0.72 0.44 - 1.00 mg/dL   Calcium 8.4 (L) 8.9 - 10.3 mg/dL   GFR, Estimated >60 >60 mL/min   Anion gap 4 (L) 5 - 15  CBC  Result Value Ref Range   WBC 4.4 4.0 - 10.5 K/uL   RBC 3.72 (L) 3.87 - 5.11 MIL/uL   Hemoglobin 12.1 12.0 - 15.0 g/dL   HCT 34.7 (L) 36.0 - 46.0 %   MCV 93.3 80.0 - 100.0 fL   MCH 32.5 26.0 - 34.0 pg   MCHC 34.9 30.0 - 36.0 g/dL   RDW 13.2 11.5 - 15.5 %   Platelets 230 150 - 400 K/uL   nRBC 0.0 0.0 - 0.2 %  Magnesium  Result Value Ref Range   Magnesium 2.0 1.7 - 2.4 mg/dL  T.pallidum Ab, Total  Result Value Ref Range   T Pallidum Abs Reactive (A) Non Reactive  Pregnancy, urine  Result Value Ref Range   Preg Test, Ur NEGATIVE NEGATIVE  CBC  Result Value Ref Range   WBC 4.3 4.0 - 10.5 K/uL   RBC 3.97 3.87 - 5.11 MIL/uL   Hemoglobin 12.6 12.0 - 15.0 g/dL   HCT 36.5 36.0 - 46.0 %   MCV 91.9 80.0 - 100.0 fL   MCH 31.7 26.0 - 34.0 pg   MCHC 34.5 30.0 -  36.0 g/dL   RDW 13.3 11.5 - 15.5 %   Platelets 271 150 - 400 K/uL   nRBC 0.0 0.0 - 0.2 %  Basic  metabolic panel  Result Value Ref Range   Sodium 135 135 - 145 mmol/L   Potassium 3.8 3.5 - 5.1 mmol/L   Chloride 106 98 - 111 mmol/L   CO2 23 22 - 32 mmol/L   Glucose, Bld 91 70 - 99 mg/dL   BUN 10 6 - 20 mg/dL   Creatinine, Ser 0.80 0.44 - 1.00 mg/dL   Calcium 8.6 (L) 8.9 - 10.3 mg/dL   GFR, Estimated >60 >60 mL/min   Anion gap 6 5 - 15      Assessment & Plan:   Problem List Items Addressed This Visit     Schizophrenia ([place])   Malignant neoplasm of upper-outer quadrant of right breast in female, estrogen receptor positive (HCC)   Relevant Medications   amoxicillin-clavulanate (AUGMENTIN) 875-125 MG tablet   Major depressive disorder, recurrent, moderate (HCC) - Primary   Relevant Medications   QUEtiapine (SEROQUEL) 100 MG tablet   traZODone (DESYREL) 50 MG tablet   Centrilobular emphysema (HCC)   Relevant Medications   albuterol (VENTOLIN HFA) 108 (90 Base) MCG/ACT inhaler   SPIRIVA RESPIMAT 2.5 MCG/ACT AERS   nicotine (NICODERM CQ) 14 mg/24hr patch   Other Visit Diagnoses     Encounter to establish care with new doctor       Non-recurrent acute suppurative otitis media of left ear with spontaneous rupture of tympanic membrane       Relevant Medications   amoxicillin-clavulanate (AUGMENTIN) 875-125 MG tablet   Tobacco abuse       Relevant Medications   nicotine (NICODERM CQ) 14 mg/24hr patch       #Tobacco Abuse Smoking cessation Discussion today >5 minutes (<10 minutes) specifically on counseling on risks of tobacco use, complications, treatment, smoking cessation. Start NRT patches per AVS 14mg  x 4 week then down to 7mg  for 2-4 weeks. Will need new order for 7mg  or OTC  #Acute L AOM with suppurative otitis, appears to be rupture of TM Loss of Hearing L side Pain assoc with TM and purulent drainage She was on PCN for Syphilis Already has ENT at [facility] for tonsils has upcoming T&A procedure Will start Augmentin, and avoid ear drops, she should f/u with ENT ASAP  contact them and see if they can eval this for her.  #Centrilobular Emphysema (COPD) Stable without complication No exacerbation Smoker, prior quit. Will start NRT to quit again Start daily maintenance therapy Spiriva 2 puff daily and Albuterol rescue PRN  #Mental Health Depression, major recurrent Hx Bipolar Schizophrenia Previously followed by Psychiatry but no longer active Prior med list includes SNRI Effexor Will re order Trazodone and Seroquel, start at lower dosages Trazodone 25-50mg  nightly for insomnia / mood and Seroquel 100mg  at night gradually can increase back to 300mg  if indicated. Advised her that I would need to refer her to Psychiatry for management of Schizophrenia / Bipolar, we discussed risks and concerns with this and right now she opts to start depression medication and follow-up if not improved.  #Breast Cancer R, Stage I Followed by Oncology/Surgery On chemoprophylaxis with Tamoxifen therapy   Meds ordered this encounter  Medications   QUEtiapine (SEROQUEL) 100 MG tablet    Sig: Take 1 tablet (100 mg total) by mouth at bedtime.    Dispense:  30 tablet    Refill:  1   traZODone (DESYREL)  50 MG tablet    Sig: Take 0.5-1 tablets (25-50 mg total) by mouth at bedtime. Start with half pill 25mg , then may increase in future if needed.    Dispense:  30 tablet    Refill:  1   albuterol (VENTOLIN HFA) 108 (90 Base) MCG/ACT inhaler    Sig: Inhale 2 puffs into the lungs every 4 (four) hours as needed for wheezing or shortness of breath (cough).    Dispense:  1 each    Refill:  3   SPIRIVA RESPIMAT 2.5 MCG/ACT AERS    Sig: Inhale 2 puffs into the lungs daily.    Dispense:  4 g    Refill:  5   nicotine (NICODERM CQ) 14 mg/24hr patch    Sig: Place 1 patch (14 mg total) onto the skin daily. For 28 days    Dispense:  28 patch    Refill:  0   amoxicillin-clavulanate (AUGMENTIN) 875-125 MG tablet    Sig: Take 1 tablet by mouth 2 (two) times daily.    Dispense:  20  tablet    Refill:  0     Follow up plan: Return in about 4 weeks (around 10/16/2020) for 4 week follow-up Depression med, Smoking NRT patches, COPD, ENT updates.  Nobie Putnam, [facility] 09/18/2020, 10:24 AM

## 2020-09-18 NOTE — Patient Instructions (Addendum)
Thank you for coming to the office today.  Call ENT [facility] to follow up on this ear, I am concerned Left ear may have infection with possible ruptured ear drum has pus in the inner ear canal and loss of hearing.  Cherylann Ratel, MD Otolaryngology NPI: [id number] [address] [place] [address]   Phone: [phone] Fax: [phone]   -----  COPD start Spiriva every day. Can use Albuterol if needed.  Restart Seroquel 100mg  lower dose, can adjust in future  Restart Trazodone 25-50mg  dose nightly.  For you try the 14mg  patch daily for 28 days, then we can order the 7mg  for 2-4 weeks.  Nicotine Patches Dosing: Brand Dosage Duration  Habitrol / "Generic nicotine patches" (21,14, or 7mg /day over 24 hours) 21mg / 24 hours 14mg /24 hours 7mg /24 hours 4 weeks then 2 weeks then 2 weeks  Nicoderm CQ  (21, 14, or 7mg /day over 24 hours) Original and Clear 21mg /24 hours 14mg /24 hours 7mg /24 hours 6 weeks then 2 weeks then 2 weeks  *Consider a small initial dosage for light smokers (10 cigarettes daily or less)  Prescribing Instructions: No smoking while using the patch Do not remove the patch from its sealed protective pouch until you are ready to apply.  Place the patch firmly on your skin in a relatively hairless location between the neck and waist. The "old" patch should be removed daily and the "new" patch should be applied in a different location.  Do not return to a previous site for at least 1 week. Discard the used patch in a way that prevents children or pets from possible exposure. Wash your hands with water when you have finished applying. The nicotine patch will cause a local skin reaction in up to 50% of patients.  Skin reactions are usually mild and are self-limiting, but may worsen over the course of therapy.  Rotating patch sites and local treatment with a topical steroid cream and may improve these reactions. If patient experiences sleep disturbances,  remove patch at bedtime   Please schedule a Follow-up Appointment to: Return in about 4 weeks (around 10/16/2020) for 4 week follow-up Depression med, Smoking NRT patches, COPD, ENT updates.  If you have any other questions or concerns, please feel free to call the office or send a message through [place]. You may also schedule an earlier appointment if necessary.  Additionally, you may be receiving a survey about your experience at our office within a few days to 1 week by e-mail or mail. We value your feedback.  Nobie Putnam, DO [place]

## 2020-09-19 ENCOUNTER — Ambulatory Visit: Payer: Medicare HMO | Attending: Infectious Diseases | Admitting: Infectious Diseases

## 2020-09-19 ENCOUNTER — Other Ambulatory Visit
Admission: RE | Admit: 2020-09-19 | Discharge: 2020-09-19 | Disposition: A | Discharge status: Home / Self Care | Payer: Medicare HMO | Attending: Infectious Diseases | Admitting: Infectious Diseases

## 2020-09-19 VITALS — BP 113/78 | HR 80 | Temp 97.5°F | Resp 16 | Ht 67.0 in | Wt 160.6 lb

## 2020-09-19 DIAGNOSIS — B373 Candidiasis of vulva and vagina: Secondary | ICD-10-CM | POA: Diagnosis not present

## 2020-09-19 DIAGNOSIS — Z113 Encounter for screening for infections with a predominantly sexual mode of transmission: Secondary | ICD-10-CM | POA: Insufficient documentation

## 2020-09-19 DIAGNOSIS — A5149 Other secondary syphilitic conditions: Secondary | ICD-10-CM | POA: Diagnosis not present

## 2020-09-19 DIAGNOSIS — H6690 Otitis media, unspecified, unspecified ear: Secondary | ICD-10-CM | POA: Insufficient documentation

## 2020-09-19 DIAGNOSIS — Z8614 Personal history of Methicillin resistant Staphylococcus aureus infection: Secondary | ICD-10-CM | POA: Diagnosis not present

## 2020-09-19 DIAGNOSIS — Z853 Personal history of malignant neoplasm of breast: Secondary | ICD-10-CM | POA: Insufficient documentation

## 2020-09-19 DIAGNOSIS — Z79899 Other long term (current) drug therapy: Secondary | ICD-10-CM | POA: Diagnosis not present

## 2020-09-19 DIAGNOSIS — F17210 Nicotine dependence, cigarettes, uncomplicated: Secondary | ICD-10-CM | POA: Diagnosis not present

## 2020-09-19 DIAGNOSIS — A5139 Other secondary syphilis of skin: Secondary | ICD-10-CM | POA: Diagnosis not present

## 2020-09-19 DIAGNOSIS — Z791 Long term (current) use of non-steroidal anti-inflammatories (NSAID): Secondary | ICD-10-CM | POA: Insufficient documentation

## 2020-09-19 DIAGNOSIS — Z923 Personal history of irradiation: Secondary | ICD-10-CM | POA: Diagnosis not present

## 2020-09-19 LAB — CHLAMYDIA/NGC RT PCR (ARMC ONLY)
Chlamydia Tr: NOT DETECTED
N gonorrhoeae: NOT DETECTED

## 2020-09-19 LAB — HIV ANTIBODY (ROUTINE TESTING W REFLEX): HIV Screen 4th Generation wRfx: NONREACTIVE

## 2020-09-19 MED ORDER — FLUCONAZOLE 100 MG PO TABS: 200.0000 mg | ORAL_TABLET | Freq: Every day | ORAL | 0 refills | Status: DC

## 2020-09-19 NOTE — Patient Instructions (Signed)
You are here for follow up - we will do some labs today and let you know whether you need another shot of penicillin You have yeast infection and once you finish antibiotic for ear infection you can take fluconazole

## 2020-09-19 NOTE — Progress Notes (Signed)
NAME: Carol Crawford  DOB: 1975-06-27  MRN: [id number]  Date/Time: 09/19/2020 11:07 AM  Subjective:   ?follow up after recent hospitazliation for rash when she was diagnosed with seconday syphilis and treated with benzathine penicillin  Carol Crawford is a 45 y.o. with a history of rt breast cancer s/p lumpectomy, radiation and on tamoxifen She is doing pretty okay- saw PCP yesterday and got amoxicillin for left ear popping symptoms Tonsillectomy planned  on Thursday thru [facility] Skin rash has resolved She has some pigmentation Has a white vaginal discharge itchy  Since being discharged from the hospital she was sexually active with her child 's father who knows about her syphilis test and he went o his doc and got a urine test she says and as told he was neg- She asked me to talk to him on the phone and I explained to him that he has to go and get a penicillin shot and a blood test is required for syphilis, and urine test cannot diagnose syphilis  Past Medical History:  Diagnosis Date   ADHD    Bipolar 1 disorder ([place])    Breast cancer ([place])    currently in treatment for   Breast disorder    right breast cancer   COPD (chronic obstructive pulmonary disease) ([place])    Depressed    Drug overdose 2010   Family history of breast cancer    Family history of colon cancer    Family history of prostate cancer    GERD (gastroesophageal reflux disease)    OCC   Headache    MIGRAINES (None for 2 months)   History of MRSA infection    foot infection   Personal history of radiation therapy 01/28/2019   right breast   Schizophrenia ([place])    Stroke ([place])    after foot surgery, left side was numb, fully recovered    Past Surgical History:  Procedure Laterality Date   BREAST BIOPSY Right 10/21/2018   Affirm Bx-"X" clip-path pending   BREAST LUMPECTOMY Right 12/02/2018   malignant   BREAST LUMPECTOMY WITH RADIOACTIVE SEED AND SENTINEL LYMPH NODE BIOPSY Right 12/02/2018   Procedure: RIGHT  BREAST LUMPECTOMY WITH RADIOACTIVE SEED AND SENTINEL LYMPH NODE BIOPSY;  Surgeon: Jovita Kussmaul, MD;  Location: [place];  Service: General;  Laterality: Right;   INCISION AND DRAINAGE Left 01/09/2018   Procedure: INCISION AND DRAINAGE-below fascia foot; multiple;  Surgeon: [name], DPM;  Location: [facility];  Service: Podiatry;  Laterality: Left;   INCISION AND DRAINAGE Left 04/17/2018   Procedure: I&D; BELOW FASCIA FOOT; MULTIPLE;  Surgeon: [name], DPM;  Location: [facility];  Service: Podiatry;  Laterality: Left;   INCISION AND DRAINAGE Right 12/18/2018   Procedure: PLACEMENT OF DRAIN IN RIGHT AXILLA;  Surgeon: Jovita Kussmaul, MD;  Location: [place];  Service: General;  Laterality: Right;   INCISION AND DRAINAGE Left 04/12/2020   Procedure: INCISION AND DRAINAGE BELOW FASCIA FOOT, SINGLE LEFT;  Surgeon: [name], DPM;  Location: [place];  Service: Podiatry;  Laterality: Left;  leave first for transportation   [place] Left 09/05/2017   Procedure: REPAIR EXTENSOR TENDON;  Surgeon: [name], DPM;  Location: [facility];  Service: Podiatry;  Laterality: Left;    Social History   Socioeconomic History   Marital status: Single    Spouse name: Not on file   Number of children: Not on file   Years of education: Not on file  Highest education level: Not on file  Occupational History   Not on file  Tobacco Use   Smoking status: Some Days    Packs/day: 0.25    Years: 30.00    Pack years: 7.50    Types: Cigarettes   Smokeless tobacco: Never   Tobacco comments:    6 cigs daily  Vaping Use   Vaping Use: Former   Start date: 08/22/2017  Substance and Sexual Activity   Alcohol use: Not Currently    Comment: occasionally   Drug use: Yes    Types: Marijuana    Comment: 1-2x week   Sexual activity: Yes  Other Topics Concern   Not on file  Social History Narrative   Not on file   Social Determinants of Health   Financial  Resource Strain: Not on file  Food Insecurity: Not on file  Transportation Needs: Not on file  Physical Activity: Not on file  Stress: Not on file  Social Connections: Not on file  Intimate Partner Violence: Not on file    Family History  Problem Relation Age of Onset   Arthritis Mother    Hypertension Mother    Cervical cancer Mother    Prostate cancer Father    Lung cancer Father    Diabetes Paternal Aunt    Breast cancer Paternal Aunt    ADD / ADHD Daughter    Bipolar disorder Daughter    Hypertension Maternal Grandmother    Stroke Maternal Grandmother    Prostate cancer Maternal Grandfather    Hypertension Maternal Grandfather    Colon cancer Maternal Grandfather    Hypertension Paternal Grandfather    Stroke Paternal Grandfather    Breast cancer Cousin    Allergies  Allergen Reactions   Shrimp [Shellfish Allergy] Hives   Tomato Rash   I? Current Outpatient Medications  Medication Sig Dispense Refill   acetaminophen (TYLENOL) 500 MG tablet Take 500 mg by mouth every 6 (six) hours as needed for moderate pain or mild pain.     albuterol (VENTOLIN HFA) 108 (90 Base) MCG/ACT inhaler Inhale 2 puffs into the lungs every 4 (four) hours as needed for wheezing or shortness of breath (cough). 1 each 3   amoxicillin-clavulanate (AUGMENTIN) 875-125 MG tablet Take 1 tablet by mouth 2 (two) times daily. 20 tablet 0   Ascorbic Acid (VITAMIN C PO) Take by mouth.     diphenhydrAMINE (BENADRYL) 25 mg capsule Take 25 mg by mouth every 6 (six) hours as needed for itching.     ibuprofen (ADVIL,MOTRIN) 600 MG tablet Take 600 mg by mouth every 6 (six) hours as needed for headache or moderate pain.      nicotine (NICODERM CQ) 14 mg/24hr patch Place 1 patch (14 mg total) onto the skin daily. For 28 days 28 patch 0   QUEtiapine (SEROQUEL) 100 MG tablet Take 1 tablet (100 mg total) by mouth at bedtime. 30 tablet 1   tamoxifen (NOLVADEX) 20 MG tablet Take 1 tablet (20 mg total) by mouth daily. 90  tablet 3   traZODone (DESYREL) 50 MG tablet Take 0.5-1 tablets (25-50 mg total) by mouth at bedtime. Start with half pill 25mg , then may increase in future if needed. 30 tablet 1   SPIRIVA RESPIMAT 2.5 MCG/ACT AERS Inhale 2 puffs into the lungs daily. 4 g 5   No current facility-administered medications for this visit.     Abtx:  Anti-infectives (From admission, onward)    None       REVIEW  OF SYSTEMS:  Const: negative fever, negative chills, negative weight loss Eyes: negative diplopia or visual changes, negative eye pain ENT: negative coryza, painful throat Resp: negative cough, hemoptysis, dyspnea Cards: negative for chest pain, palpitations, lower extremity edema GU: negative for frequency, dysuria and hematuria GI: Negative for abdominal pain, diarrhea, bleeding, constipation Skin: negative for rash and pruritus Heme: negative for easy bruising and gum/nose bleeding MS: negative for myalgias, arthralgias, back pain and muscle weakness Neurolo:negative for headaches, dizziness, vertigo, memory problems  Psych: negative for feelings of anxiety, depression  Endocrine: negative for thyroid, diabetes Allergy/Immunology-as above    VITALS:  BP 113/78   Pulse 80   Temp (!) 97.5 F (36.4 C) (Temporal)   Resp 16   Ht 5\' 7"  (1.702 m)   Wt 160 lb 9.6 oz (72.8 kg)   SpO2 98%   BMI 25.15 kg/m  PHYSICAL EXAM:  General: Alert, cooperative, tearful at times regarding her diagnosis of syphilis appears stated age.  Head: Normocephalic, without obvious abnormality, atraumatic. Eyes: Conjunctivae clear, anicteric sclerae. Pupils are equal NT Nares normal. No drainage or sinus tenderness. Lips, mucosa, and tongue normal. No Thrush Neck: Supple, symmetrical, no adenopathy, thyroid: non tender no carotid bruit and no JVD. Back: No CVA tenderness. Lungs: Clear to auscultation bilaterally. No Wheezing or Rhonchi. No rales. Heart: Regular rate and rhythm, no murmur, rub or  gallop. Abdomen: Soft, non-tender,not distended. Bowel sounds normal. No masses Genital - curdy white discharge Extremities: atraumatic, no cyanosis. No edema. No clubbing Skin: No rashes or lesions. Or bruising Lymph: Cervical, supraclavicular normal. Neurologic: Grossly non-focal Pertinent Labs Lab Results CBC    Component Value Date/Time   WBC 4.3 08/31/2020 0714   RBC 3.97 08/31/2020 0714   HGB 12.6 08/31/2020 0714   HCT 36.5 08/31/2020 0714   PLT 271 08/31/2020 0714   MCV 91.9 08/31/2020 0714   MCH 31.7 08/31/2020 0714   MCHC 34.5 08/31/2020 0714   RDW 13.3 08/31/2020 0714   LYMPHSABS 1.7 08/28/2020 1728   MONOABS 0.4 08/28/2020 1728   EOSABS 0.1 08/28/2020 1728   BASOSABS 0.0 08/28/2020 1728    CMP Latest Ref Rng & Units 08/31/2020 08/29/2020 08/28/2020  Glucose 70 - 99 mg/dL 91 148(H) 156(H)  BUN 6 - 20 mg/dL 10 7 10   Creatinine 0.44 - 1.00 mg/dL 0.80 0.72 0.77  Sodium 135 - 145 mmol/L 135 136 136  Potassium 3.5 - 5.1 mmol/L 3.8 4.2 3.0(L)  Chloride 98 - 111 mmol/L 106 109 107  CO2 22 - 32 mmol/L 23 23 24   Calcium 8.9 - 10.3 mg/dL 8.6(L) 8.4(L) 8.8(L)  Total Protein 6.5 - 8.1 g/dL - - 7.1  Total Bilirubin 0.3 - 1.2 mg/dL - - 0.6  Alkaline Phos 38 - 126 U/L - - 82  AST 15 - 41 U/L - - 23  ALT 0 - 44 U/L - - 17   ? Impression/Recommendation ?Secondary syphilis-with skin rash - recent diagnosis- RPR was 1:32- treated with 1 dose of benzathine penicillin while she was admitted to hospital for possible Kathreen Cosier which turned out to be syphilis She also had genital lesions which have resolved now Her partner has not been treated and they are having sex- spoke to him on the phone and told him that he will have to be treated ( see the discussion above) Repeating RPR today to see whether it is increasing- if worsening will need another dose of BP  Pt had a diagnosis of childhood pCN allergy but  tolerated amoxicillin challenge while in [facility] and then took Benzathine  penicillin 2.4 million units on 08/30/20 with no reaction  Candida vulvovaginitis- fluconazole 200mg  one dose after she completes amoxicillin for ear   Ear infection- given amoxicillin by PCP  Tonsillectomy planned for Thursday at [facility]  Today checked RPR/HIV  Checked GC/CHL genital Will contact her with result and management plan  Spent 45 min with ehr and spoke to her partner on the phone Note:  This document was prepared using Dragon voice recognition software and may include unintentional dictation errors.

## 2020-09-22 LAB — T.PALLIDUM AB, TOTAL: T Pallidum Abs: REACTIVE — AB

## 2020-09-30 ENCOUNTER — Other Ambulatory Visit: Payer: Self-pay

## 2020-09-30 ENCOUNTER — Emergency Department: Payer: Medicare HMO

## 2020-09-30 ENCOUNTER — Emergency Department
Admission: EM | Admit: 2020-09-30 | Discharge: 2020-10-01 | Disposition: A | Discharge status: Home / Self Care | Payer: Medicare HMO | Attending: Emergency Medicine | Admitting: Emergency Medicine

## 2020-09-30 DIAGNOSIS — J384 Edema of larynx: Secondary | ICD-10-CM | POA: Diagnosis not present

## 2020-09-30 DIAGNOSIS — R110 Nausea: Secondary | ICD-10-CM | POA: Insufficient documentation

## 2020-09-30 DIAGNOSIS — J029 Acute pharyngitis, unspecified: Secondary | ICD-10-CM | POA: Insufficient documentation

## 2020-09-30 DIAGNOSIS — H9202 Otalgia, left ear: Secondary | ICD-10-CM | POA: Diagnosis not present

## 2020-09-30 DIAGNOSIS — Z20822 Contact with and (suspected) exposure to covid-19: Secondary | ICD-10-CM | POA: Insufficient documentation

## 2020-09-30 DIAGNOSIS — Z9011 Acquired absence of right breast and nipple: Secondary | ICD-10-CM | POA: Insufficient documentation

## 2020-09-30 DIAGNOSIS — J449 Chronic obstructive pulmonary disease, unspecified: Secondary | ICD-10-CM | POA: Insufficient documentation

## 2020-09-30 DIAGNOSIS — J341 Cyst and mucocele of nose and nasal sinus: Secondary | ICD-10-CM | POA: Diagnosis not present

## 2020-09-30 DIAGNOSIS — H6691 Otitis media, unspecified, right ear: Secondary | ICD-10-CM | POA: Diagnosis not present

## 2020-09-30 DIAGNOSIS — F17210 Nicotine dependence, cigarettes, uncomplicated: Secondary | ICD-10-CM | POA: Diagnosis not present

## 2020-09-30 DIAGNOSIS — L0211 Cutaneous abscess of neck: Secondary | ICD-10-CM | POA: Diagnosis not present

## 2020-09-30 DIAGNOSIS — G893 Neoplasm related pain (acute) (chronic): Secondary | ICD-10-CM | POA: Diagnosis not present

## 2020-09-30 DIAGNOSIS — Z853 Personal history of malignant neoplasm of breast: Secondary | ICD-10-CM | POA: Diagnosis not present

## 2020-09-30 DIAGNOSIS — J392 Other diseases of pharynx: Secondary | ICD-10-CM | POA: Diagnosis not present

## 2020-09-30 DIAGNOSIS — R070 Pain in throat: Secondary | ICD-10-CM | POA: Diagnosis present

## 2020-09-30 DIAGNOSIS — G8918 Other acute postprocedural pain: Secondary | ICD-10-CM | POA: Diagnosis not present

## 2020-09-30 DIAGNOSIS — Z9089 Acquired absence of other organs: Secondary | ICD-10-CM

## 2020-09-30 DIAGNOSIS — H65192 Other acute nonsuppurative otitis media, left ear: Secondary | ICD-10-CM

## 2020-09-30 LAB — BASIC METABOLIC PANEL
Anion gap: 3 — ABNORMAL LOW (ref 5–15)
BUN: 6 mg/dL (ref 6–20)
CO2: 25 mmol/L (ref 22–32)
Calcium: 8.6 mg/dL — ABNORMAL LOW (ref 8.9–10.3)
Chloride: 110 mmol/L (ref 98–111)
Creatinine, Ser: 0.92 mg/dL (ref 0.44–1.00)
GFR, Estimated: >60 mL/min (ref >60)
Glucose, Bld: 110 mg/dL — ABNORMAL HIGH (ref 70–99)
Potassium: 3.2 mmol/L — ABNORMAL LOW (ref 3.5–5.1)
Sodium: 138 mmol/L (ref 135–145)

## 2020-09-30 LAB — CBC WITH DIFFERENTIAL/PLATELET
Abs Immature Granulocytes: 0 10*3/uL (ref 0.00–0.07)
Basophils Absolute: 0 10*3/uL (ref 0.0–0.1)
Basophils Relative: 1 %
Eosinophils Absolute: 0.1 10*3/uL (ref 0.0–0.5)
Eosinophils Relative: 1 %
HCT: 37.5 % (ref 36.0–46.0)
Hemoglobin: 12.9 g/dL (ref 12.0–15.0)
Immature Granulocytes: 0 %
Lymphocytes Relative: 46 %
Lymphs Abs: 2.3 10*3/uL (ref 0.7–4.0)
MCH: 31.9 pg (ref 26.0–34.0)
MCHC: 34.4 g/dL (ref 30.0–36.0)
MCV: 92.6 fL (ref 80.0–100.0)
Monocytes Absolute: 0.3 10*3/uL (ref 0.1–1.0)
Monocytes Relative: 6 %
Neutro Abs: 2.3 10*3/uL (ref 1.7–7.7)
Neutrophils Relative %: 46 %
Platelets: 265 10*3/uL (ref 150–400)
RBC: 4.05 MIL/uL (ref 3.87–5.11)
RDW: 13.4 % (ref 11.5–15.5)
WBC: 5 10*3/uL (ref 4.0–10.5)
nRBC: 0 % (ref 0.0–0.2)

## 2020-09-30 MED ORDER — IOHEXOL 300 MG/ML  SOLN
75.0000 mL | Freq: Once | INTRAMUSCULAR | Status: AC | PRN
  Administered 2020-09-30: 75 mL via INTRAVENOUS

## 2020-09-30 MED ORDER — KETOROLAC TROMETHAMINE 30 MG/ML IJ SOLN
30.0000 mg | Freq: Once | INTRAMUSCULAR | Status: AC
  Administered 2020-09-30: 30 mg via INTRAVENOUS
  Filled 2020-09-30: qty 1

## 2020-09-30 MED ORDER — DEXAMETHASONE SODIUM PHOSPHATE 10 MG/ML IJ SOLN
10.0000 mg | Freq: Once | INTRAMUSCULAR | Status: AC
  Administered 2020-09-30: 10 mg via INTRAVENOUS
  Filled 2020-09-30: qty 1

## 2020-09-30 MED ORDER — SODIUM CHLORIDE 0.9 % IV BOLUS (SEPSIS)
1000.0000 mL | Freq: Once | INTRAVENOUS | Status: AC
  Administered 2020-09-30: 1000 mL via INTRAVENOUS

## 2020-09-30 NOTE — ED Notes (Signed)
Pt taken to CT at this time.

## 2020-09-30 NOTE — ED Triage Notes (Signed)
Pt arrives to ED from home via POV with c/c of throat pain, nausea and vomiting. Pt had tonsils removed last Thursday and pain has increased ever since. Pt states she had an ear infection in her left ear when they performed the procedure and believes the infection has spread to her tonsils. Pt states she has had fevers on and off since procedure. Pt last took oxycodone for pain at approx 12pm this afternoon. Pt A&Ox4, no respiratory difficulties noted.

## 2020-10-01 DIAGNOSIS — L0211 Cutaneous abscess of neck: Secondary | ICD-10-CM | POA: Diagnosis not present

## 2020-10-01 DIAGNOSIS — J392 Other diseases of pharynx: Secondary | ICD-10-CM | POA: Diagnosis not present

## 2020-10-01 DIAGNOSIS — J384 Edema of larynx: Secondary | ICD-10-CM | POA: Diagnosis not present

## 2020-10-01 DIAGNOSIS — J341 Cyst and mucocele of nose and nasal sinus: Secondary | ICD-10-CM | POA: Diagnosis not present

## 2020-10-01 DIAGNOSIS — J029 Acute pharyngitis, unspecified: Secondary | ICD-10-CM | POA: Diagnosis not present

## 2020-10-01 LAB — HCG, QUANTITATIVE, PREGNANCY: hCG, Beta Chain, Quant, S: <1 m[IU]/mL (ref <5)

## 2020-10-01 LAB — RESP PANEL BY RT-PCR (FLU A&B, COVID) ARPGX2
Influenza A by PCR: NEGATIVE
Influenza B by PCR: NEGATIVE
SARS Coronavirus 2 by RT PCR: NEGATIVE

## 2020-10-01 MED ORDER — SODIUM CHLORIDE 0.9 % IV SOLN
3.0000 g | Freq: Once | INTRAVENOUS | Status: AC
  Administered 2020-10-01: 3 g via INTRAVENOUS
  Filled 2020-10-01: qty 8

## 2020-10-01 MED ORDER — IBUPROFEN 800 MG PO TABS: 800.0000 mg | ORAL_TABLET | Freq: Three times a day (TID) | ORAL | 0 refills | Status: DC | PRN

## 2020-10-01 NOTE — Discharge Instructions (Signed)
Please continue your antibiotics and pain medication as prescribed.  You may also begin taking ibuprofen 800 mg every 8 hours as needed for pain.  Your CT scan showed mild inflammation of your pharynx today.  We have given you a dose of anti-inflammatories, steroids and IV antibiotics here.  You had no sign of any postoperative complications, bleeding, abscess.  Your lab work was reassuring.  You did have fluid behind the left eardrum but no sign of an ear infection.  You may use over-the-counter Zyrtec in the day and Benadryl at night which can help with this effusion.  I recommend that you call your ENT doctor on Monday morning for further recommendations and close outpatient follow-up.

## 2020-10-01 NOTE — ED Provider Notes (Signed)
[facility] Emergency Department Provider Note  ____________________________________________   Event Date/Time   First MD Initiated Contact with Patient 09/30/20 2300     (approximate)  I have reviewed the triage vital signs and the nursing notes.   HISTORY  Chief Complaint Nausea and Oral Swelling    HPI Carol Crawford is a 45 y.o. female with history of COPD, schizophrenia, bipolar disorder who presents to the emergency department with complaints of throat pain since having a tonsillectomy on 09/21/2020 at [facility] by Dr. Anthony Sar.  States she had a fever of 101 2 days ago.  None since.  She is also had some left ear pain.  She is on Augmentin and Percocet.  She states that she became concerned tonight when she looked in the back of her throat and saw white, green areas and thought she could have infection.  No bleeding.  States it hurts to swallow.  States that she feels like her throat is swollen it is hard to lie flat.  Last took her Percocet in the waiting room around 9 PM.         Past Medical History:  Diagnosis Date   ADHD    Bipolar 1 disorder ([place])    Breast cancer ([place])    currently in treatment for   Breast disorder    right breast cancer   COPD (chronic obstructive pulmonary disease) ([place])    Depressed    Drug overdose 2010   Family history of breast cancer    Family history of colon cancer    Family history of prostate cancer    GERD (gastroesophageal reflux disease)    OCC   Headache    MIGRAINES (None for 2 months)   History of MRSA infection    foot infection   Personal history of radiation therapy 01/28/2019   right breast   Schizophrenia ([place])    Stroke ([place])    after foot surgery, left side was numb, fully recovered    Patient Active Problem List   Diagnosis Date Noted   Centrilobular emphysema ([place]) 09/18/2020   Schizophrenia ([place]) 09/17/2019   Self-mutilation 2007 09/17/2019   Smoker 2-4 cpd 09/17/2019    Genetic testing 11/26/2018   Family history of breast cancer 09/2018 with lumpectomy 12/02/18, radiation 01/28/19-03/31/19, Tamoxifen 03/2019-03/2024    Family history of prostate cancer    Family history of colon cancer    Malignant neoplasm of upper-outer quadrant of right breast in female, estrogen receptor positive ([place]) 11/10/2018   Left sided numbness 01/09/2018   Major depressive disorder, recurrent, moderate ([place])    Bipolar 1 disorder ([place]) with SA 2020 (OD pain pills)     Past Surgical History:  Procedure Laterality Date   BREAST BIOPSY Right 10/21/2018   Affirm Bx-"X" clip-path pending   BREAST LUMPECTOMY Right 12/02/2018   malignant   BREAST LUMPECTOMY WITH RADIOACTIVE SEED AND SENTINEL LYMPH NODE BIOPSY Right 12/02/2018   Procedure: RIGHT BREAST LUMPECTOMY WITH RADIOACTIVE SEED AND SENTINEL LYMPH NODE BIOPSY;  Surgeon: Jovita Kussmaul, MD;  Location: [place];  Service: General;  Laterality: Right;   INCISION AND DRAINAGE Left 01/09/2018   Procedure: INCISION AND DRAINAGE-below fascia foot; multiple;  Surgeon: [name], DPM;  Location: [facility];  Service: Podiatry;  Laterality: Left;   INCISION AND DRAINAGE Left 04/17/2018   Procedure: I&D; BELOW FASCIA FOOT; MULTIPLE;  Surgeon: [name], DPM;  Location: [facility];  Service: Podiatry;  Laterality: Left;  INCISION AND DRAINAGE Right 12/18/2018   Procedure: PLACEMENT OF DRAIN IN RIGHT AXILLA;  Surgeon: Jovita Kussmaul, MD;  Location: [place];  Service: General;  Laterality: Right;   INCISION AND DRAINAGE Left 04/12/2020   Procedure: INCISION AND DRAINAGE BELOW FASCIA FOOT, SINGLE LEFT;  Surgeon: [name], DPM;  Location: [place];  Service: Podiatry;  Laterality: Left;  leave first for transportation   [place] Left 09/05/2017   Procedure: REPAIR EXTENSOR TENDON;  Surgeon: [name], DPM;  Location: [facility];  Service: Podiatry;  Laterality: Left;    Prior to Admission  medications   Medication Sig Start Date End Date Taking? Authorizing Provider  ibuprofen (ADVIL) 800 MG tablet Take 1 tablet (800 mg total) by mouth every 8 (eight) hours as needed for mild pain. 10/01/20  Yes Meily Glowacki, Delice Bison, DO  acetaminophen (TYLENOL) 500 MG tablet Take 500 mg by mouth every 6 (six) hours as needed for moderate pain or mild pain.    [provider]  albuterol (VENTOLIN HFA) 108 (90 Base) MCG/ACT inhaler Inhale 2 puffs into the lungs every 4 (four) hours as needed for wheezing or shortness of breath (cough). 09/18/20   Karamalegos, Devonne Doughty, DO  amoxicillin-clavulanate (AUGMENTIN) 875-125 MG tablet Take 1 tablet by mouth 2 (two) times daily. 09/18/20   Karamalegos, Devonne Doughty, DO  Ascorbic Acid (VITAMIN C PO) Take by mouth.    [provider]  diphenhydrAMINE (BENADRYL) 25 mg capsule Take 25 mg by mouth every 6 (six) hours as needed for itching.    [provider]  fluconazole (DIFLUCAN) 100 MG tablet Take 2 tablets (200 mg total) by mouth daily. 09/19/20   Tsosie Billing, MD  nicotine (NICODERM CQ) 14 mg/24hr patch Place 1 patch (14 mg total) onto the skin daily. For 28 days 09/18/20   Olin Hauser, DO  QUEtiapine (SEROQUEL) 100 MG tablet Take 1 tablet (100 mg total) by mouth at bedtime. 09/18/20   Karamalegos, Devonne Doughty, DO  SPIRIVA RESPIMAT 2.5 MCG/ACT AERS Inhale 2 puffs into the lungs daily. 09/18/20   Karamalegos, Devonne Doughty, DO  tamoxifen (NOLVADEX) 20 MG tablet Take 1 tablet (20 mg total) by mouth daily. 05/01/20   Nicholas Lose, MD  traZODone (DESYREL) 50 MG tablet Take 0.5-1 tablets (25-50 mg total) by mouth at bedtime. Start with half pill 25mg , then may increase in future if needed. 09/18/20   Karamalegos, Devonne Doughty, DO    Allergies Shrimp [shellfish allergy] and Tomato  Family History  Problem Relation Age of Onset   Arthritis Mother    Hypertension Mother    Cervical cancer Mother    Prostate cancer Father    Lung  cancer Father    Diabetes Paternal 54    Breast cancer Paternal Aunt    ADD / ADHD Daughter    Bipolar disorder Daughter    Hypertension Maternal Grandmother    Stroke Maternal Grandmother    Prostate cancer Maternal Grandfather    Hypertension Maternal Grandfather    Colon cancer Maternal Grandfather    Hypertension Paternal Grandfather    Stroke Paternal Grandfather    Breast cancer Cousin     Social History Social History   Tobacco Use   Smoking status: Some Days    Packs/day: 0.25    Years: 30.00    Pack years: 7.50    Types: Cigarettes   Smokeless tobacco: Never   Tobacco comments:    6 cigs daily  Vaping Use  Vaping Use: Former   Start date: 08/22/2017  Substance Use Topics   Alcohol use: Not Currently    Comment: occasionally   Drug use: Yes    Types: Marijuana    Comment: 1-2x week    Review of Systems Constitutional: + fever. Eyes: No visual changes. ENT: + sore throat. Cardiovascular: Denies chest pain. Respiratory: Denies shortness of breath. Gastrointestinal: No nausea, vomiting, diarrhea. Genitourinary: Negative for dysuria. Musculoskeletal: Negative for back pain. Skin: Negative for rash. Neurological: Negative for focal weakness or numbness.  ____________________________________________   PHYSICAL EXAM:  VITAL SIGNS: ED Triage Vitals  Enc Vitals Group     BP 09/30/20 1911 114/84     Pulse Rate 09/30/20 1911 83     Resp 09/30/20 1911 20     Temp 09/30/20 1911 98.1 F (36.7 C)     Temp src --      SpO2 09/30/20 1911 100 %     Weight 09/30/20 1914 159 lb (72.1 kg)     Height 09/30/20 1914 5\' 7"  (1.702 m)     Head Circumference --      Peak Flow --      Pain Score 09/30/20 1912 10     Pain Loc --      Pain Edu? --      Excl. in [place]? --    CONSTITUTIONAL: Alert and oriented and responds appropriately to questions. Well-appearing; well-nourished, afebrile, nontoxic HEAD: Normocephalic EYES: Conjunctivae clear, pupils appear equal,  EOM appear intact ENT: normal nose; moist mucous membranes, no dental caries, patient has white eschars in the posterior oropharynx bilaterally with no bleeding, purulent drainage.  Her phonation is normal.  She has no trismus or drooling.  She is handling secretions without difficulty.  TM on the right is clear without erythema, purulence, bulging, perforation or effusion.  Left TM has clear effusion but no redness, drainage or bleeding.  No cerumen impaction or sign of foreign body in the external auditory canal. No inflammation, erythema or drainage from the external auditory canal. No signs of mastoiditis. No pain with manipulation of the pinna bilaterally. NECK: Supple, normal ROM, no meningismus, trachea midline, no cervical lymphadenopathy appreciated CARD: RRR; S1 and S2 appreciated; no murmurs, no clicks, no rubs, no gallops RESP: Normal chest excursion without splinting or tachypnea; breath sounds clear and equal bilaterally; no wheezes, no rhonchi, no rales, no hypoxia or respiratory distress, speaking full sentences ABD/GI: Normal bowel sounds; non-distended; soft, non-tender, no rebound, no guarding, no peritoneal signs, no hepatosplenomegaly BACK: The back appears normal EXT: Normal ROM in all joints; no deformity noted, no edema; no cyanosis SKIN: Normal color for age and race; warm; no rash on exposed skin NEURO: Moves all extremities equally PSYCH: The patient's mood and manner are appropriate.  ____________________________________________   LABS (all labs ordered are listed, but only abnormal results are displayed)  Labs Reviewed  BASIC METABOLIC PANEL - Abnormal; Notable for the following components:      Result Value   Potassium 3.2 (*)    Glucose, Bld 110 (*)    Calcium 8.6 (*)    Anion gap 3 (*)    All other components within normal limits  RESP PANEL BY RT-PCR (FLU A&B, COVID) ARPGX2  CBC WITH DIFFERENTIAL/PLATELET  HCG, QUANTITATIVE, PREGNANCY    ____________________________________________  EKG   ____________________________________________  RADIOLOGY I, Montrail Mehrer, personally viewed and evaluated these images (plain radiographs) as part of my medical decision making, as well as reviewing the written report  by the radiologist.  ED MD interpretation: Mild mucosal edema without abscess.  Official radiology report(s): CT Soft Tissue Neck W Contrast  Result Date: 10/01/2020 CLINICAL DATA:  Initial evaluation for neck abscess, throat pain, recent tonsillectomy. EXAM: CT NECK WITH CONTRAST TECHNIQUE: Multidetector CT imaging of the neck was performed using the standard protocol following the bolus administration of intravenous contrast. CONTRAST:  80mL OMNIPAQUE IOHEXOL 300 MG/ML  SOLN COMPARISON:  None. FINDINGS: Pharynx and larynx: Oral cavity within normal limits. No acute inflammatory changes seen about the dentition. [place] tonsils are absent. There is mild mucosal edema involving the oropharyngeal mucosa, greater on the left (series 2, image 37), suggesting pharyngitis. Nasopharynx within normal limits. No significant retropharyngeal edema or collection. Focal edema with mild thickening at the tip of the epiglottis suggest associated supraglottitis (series 5, image 56). Vallecula clear. Remainder of the hypopharynx and supraglottic larynx within normal limits. Supraglottic airway remains widely patent at this time. Glottis normal. Subglottic airway clear. Salivary glands: Salivary glands including the parotid and submandibular glands are within normal limits. Thyroid: Normal. Lymph nodes: No enlarged or pathologic adenopathy within the neck. Vascular: Normal intravascular enhancement seen throughout the neck. Small focus of gas within the right IJ noted, likely related IV access. Limited intracranial: Unremarkable. Visualized orbits: Punctate density at the posterior aspect of the right globe noted, favored to reflect a small  calcification. Visualized globes and orbital soft tissues otherwise unremarkable. Mastoids and visualized paranasal sinuses: Left maxillary sinus retention cyst. Mild mucosal thickening within the ethmoidal air cells. Visualized paranasal sinuses are otherwise clear. Visualized mastoid air cells and middle ear cavities are well pneumatized and free of fluid. Skeleton: No visible acute osseous finding. Mild to moderate cervical spondylosis noted at C5-6 and C6-7. Few scattered periapical lucencies noted about the teeth. 2 cm sclerotic lesion within the right mandibular body noted, nonspecific, but most likely benign. No other discrete osseous lesions. Upper chest: Visualized upper chest demonstrates no acute finding. Partially visualized lungs are clear. Other: None. IMPRESSION: Mild mucosal edema involving the left greater than right oropharyngeal mucosa as well as the epiglottic tip, suggesting acute pharyngitis/supraglottitis. Supraglottic airway remains widely patent at this time. No discrete abscess or drainable fluid collection. Electronically Signed   By: Jeannine Boga M.D.   On: 10/01/2020 01:56    ____________________________________________   PROCEDURES  Procedure(s) performed (including Critical Care):  Procedures   ____________________________________________   INITIAL IMPRESSION / ASSESSMENT AND PLAN / ED COURSE  As part of my medical decision making, I reviewed the following data within the [place] notes reviewed and incorporated, Labs reviewed , Old chart reviewed, Radiograph reviewed , Notes from prior ED visits, and [place] Controlled Substance Database         Patient here with pain after a tonsillectomy.  She states she was concerned when she looked in the back of her throat and thought she could be having an infection.  She has normal-appearing appropriate eschars without signs of obvious infection and no bleeding.  She states she feels like it  is hard to swallow and has to sit upright when she is sleeping.  She has a patent posterior oropharynx and has normal phonation today and no difficulty handling her secretions.  We will treat symptomatically with Toradol, Decadron.  Labs obtained in triage unremarkable.  Will obtain CT of the neck but I have low suspicion for postoperative abscess or other complication today.  I did discuss with patient that her postoperative  course seems appropriate.  She does have an effusion of the left ear but no sign of otitis media, otitis externa.  She is already on Augmentin.  ED PROGRESS  CT scan shows mild mucosal edema consistent with pharyngitis, supraglottitis.  Airway patent.  She has been able to swallow secretions, tolerate p.o. here.  No abscess or other postoperative complication seen.  She has received Toradol, Decadron and a dose of Unasyn here.  She reports she still has 3 days left of Augmentin.  She has pain medication for home.  We will also give prescription for ibuprofen as it does not appear that she is taking any anti-inflammatories.  I recommended that she contact her ENT Monday morning for close outpatient follow-up.  She states her follow-up appointment is scheduled for July 29.  Discussed return precautions.  I feel she is safe for discharge home.  At this time, I do not feel there is any life-threatening condition present. I have reviewed, interpreted and discussed all results (EKG, imaging, lab, urine as appropriate) and exam findings with patient/family. I have reviewed nursing notes and appropriate previous records.  I feel the patient is safe to be discharged home without further emergent workup and can continue workup as an outpatient as needed. Discussed usual and customary return precautions. Patient/family verbalize understanding and are comfortable with this plan.  Outpatient follow-up has been provided as needed. All questions have been  answered.  ____________________________________________   FINAL CLINICAL IMPRESSION(S) / ED DIAGNOSES  Final diagnoses:  Post-tonsillectomy pain  Acute effusion of left ear  Pharyngitis, unspecified etiology     ED Discharge Orders          Ordered    ibuprofen (ADVIL) 800 MG tablet  Every 8 hours PRN        10/01/20 0215            *Please note:  Carol Crawford was evaluated in Emergency Department on 10/01/2020 for the symptoms described in the history of present illness. She was evaluated in the context of the global COVID-19 pandemic, which necessitated consideration that the patient might be at risk for infection with the SARS-CoV-2 virus that causes COVID-19. Institutional protocols and algorithms that pertain to the evaluation of patients at risk for COVID-19 are in a state of rapid change based on information released by regulatory bodies including the CDC and federal and state organizations. These policies and algorithms were followed during the patient's care in the ED.  Some ED evaluations and interventions may be delayed as a result of limited staffing during and the pandemic.*   Note:  This document was prepared using Dragon voice recognition software and may include unintentional dictation errors.    Lucillia Corson, Delice Bison, DO 10/01/20 [phone]

## 2020-10-01 NOTE — ED Notes (Signed)
Abx initiated at this time. Pt given ginger ale per her request. Denies further needs. Call bell within reach of patient at this time.

## 2020-10-01 NOTE — ED Notes (Signed)
NAD noted at this time. Pt continues to rest in bed with lights dimmed. Pt denies any needs at this time. Call bell within reach of patient at this time.

## 2020-10-01 NOTE — ED Notes (Signed)
NAD noted at this this time. Pt resting in bed. Lights dimmed per patient request. Call bell within reach of patient at this time.

## 2020-10-01 NOTE — ED Notes (Signed)
NAD noted at time of D/C. Pt denies questions or concerns. Pt ambulatory to the lobby at this time. Pt refused wheelchair to the lobby.  

## 2020-10-03 ENCOUNTER — Telehealth: Payer: Self-pay

## 2020-10-03 NOTE — Telephone Encounter (Addendum)
Spoke to [employer] regarding RPR Titer. Result states >16. Seth Bake in lab states the result is actually >1-16 and they will fix in the chart as that is what should be in the titer result.   Advised patient of results and discussed that we will recheck in 3 months provided her partner was treated we should continue to move in the right direction and not require treatment. Patient understood to come to [facility] in 3 month and go to lab for RPR recheck. I also discussed protection and the risks and transmission and she states she is not with that partner anymore. I did remind her anyone who she slept with in past with positive RPR would require treatment and testing before she were to sleep with that person or she will get reinfected. She states she is aware and I will see her labs in 3 months.

## 2020-10-04 LAB — RPR
RPR Ser Ql: REACTIVE — AB
RPR Titer: 1:16 {titer}

## 2020-10-05 ENCOUNTER — Telehealth: Payer: Self-pay

## 2020-10-05 NOTE — Telephone Encounter (Signed)
Patient called to inform that her partner was dx with Syphilis in ED today and treated with PCN injections. She reports having had unprotected sex 3 days ago. I advised her to come in and be treated next week per Dr R as labs resulted Tuesday are not valid since the partner is now positive and they have had sex since lab work was drawn. Patient will come Tuesday for nurse visit.

## 2020-10-10 ENCOUNTER — Ambulatory Visit: Payer: Medicare HMO

## 2020-10-11 ENCOUNTER — Other Ambulatory Visit: Payer: Self-pay

## 2020-10-11 ENCOUNTER — Ambulatory Visit: Payer: Self-pay | Admitting: Advanced Practice Midwife

## 2020-10-11 DIAGNOSIS — Z202 Contact with and (suspected) exposure to infections with a predominantly sexual mode of transmission: Secondary | ICD-10-CM

## 2020-10-11 MED ORDER — PENICILLIN G BENZATHINE 2400000 UNIT/4ML IM SUSP
2.4000 10*6.[IU] | Freq: Once | INTRAMUSCULAR | Status: AC
  Administered 2020-10-11: 2400000 [IU] via INTRAMUSCULAR

## 2020-10-11 NOTE — Progress Notes (Signed)
   [place] problem visit  [place] Department  Subjective:  Carol Crawford is a 45 y.o. BF [id number] smoker being seen today as contact to syphillis. She was hospitalized 08/28/20-08/31/20 and dx'd and treated for syphillis. Last sex 10/07/20 without condom; with current partner x 15-20 years; 2 partners in last 3 mo. One of her partners was treated for syphillis on 10/08/20. Last pap 08/06/17 neg HPV neg. She was diagnosed with +estrogen receptor breast cancer on 09/2018 followed by radiation and Tamoxifen.  Chief Complaint  Patient presents with   SEXUALLY TRANSMITTED DISEASE    Treatment of Syphilis    HPI   Does the patient have a current or past history of drug use? Yes   No components found for: HCV]   Health Maintenance Due  Topic Date Due   COVID-19 Vaccine (1) Never done   Pneumococcal Vaccine 29-101 Years old (1 - PCV) Never done   Hepatitis C Screening  Never done   TETANUS/TDAP  Never done    ROS  The following portions of the patient's history were reviewed and updated as appropriate: allergies, current medications, past family history, past medical history, past social history, past surgical history and problem list. Problem list updated.   See flowsheet for other program required questions.  Objective:  There were no vitals filed for this visit.  Physical Exam Pt declines   Assessment and Plan:  Carol Crawford is a 45 y.o. female presenting to the [facility] Department for a Women's Health problem visit  1. Syphilis contact Treat per standing orders with Bicillin IM today     No follow-ups on file.  Future Appointments  Date Time Provider [place]  10/31/2020 11:15 AM Nicholas Lose, MD [facility] None    Herbie Saxon, CNM

## 2020-10-11 NOTE — Progress Notes (Signed)
Pt here for treatment of Syphilis only.  Bicillin given IM without any complications. Windle Guard, RN

## 2020-10-20 DIAGNOSIS — H6122 Impacted cerumen, left ear: Secondary | ICD-10-CM | POA: Diagnosis not present

## 2020-10-30 NOTE — Progress Notes (Signed)
Patient Care Team: Olin Hauser, DO as PCP - General (Family Medicine) Rico Junker, RN as Registered Nurse Theodore Demark, RN as Registered Nurse Jovita Kussmaul, MD as Consulting Physician (General Surgery) Nicholas Lose, MD as Consulting Physician (Hematology and Oncology) Kyung Rudd, MD as Consulting Physician (Radiation Oncology)  DIAGNOSIS:    ICD-10-CM   1. Malignant neoplasm of upper-outer quadrant of right breast in female, estrogen receptor positive ([place])  C50.411    Z17.0       SUMMARY OF ONCOLOGIC HISTORY: Oncology History  Malignant neoplasm of upper-outer quadrant of right breast in female, estrogen receptor positive ([place])  10/21/2018 Initial Diagnosis   44-monthfollow-up of breast calcifications showed calcifications increased in size, spanning 1.5cm, with no discrete mass. Biopsy on 10/21/18 showed invasive mammary carcinoma, grade 1, HER-2 negative (0), ER+ 51-90%, PR+ >90% ([facility]   11/10/2018 Cancer Staging   Staging form: Breast, AJCC 8th Edition - Clinical stage from 11/10/2018: Stage IA (cT1c, cN0, cM0, G1, ER+, PR+, HER2-)   11/25/2018 Genetic Testing   Negative genetic testing on a STAT panel.  The STAT Breast cancer panel offered by [facility] includes sequencing and rearrangement analysis for the following 9 genes:  ATM, BRCA1, BRCA2, CDH1, CHEK2, PALB2, PTEN, STK11 and TP53.   The report date is 11/18/2018.    SMARCA4 c.925C>A VUS identified on the multicancer panel.  The Multi-Gene Panel offered by [facility] includes sequencing and/or deletion duplication testing of the following 85 genes: AIP, ALK, APC, ATM, AXIN2,BAP1,  BARD1, BLM, BMPR1A, BRCA1, BRCA2, BRIP1, CASR, CDC73, CDH1, CDK4, CDKN1B, CDKN1C, CDKN2A (p14ARF), CDKN2A (p16INK4a), CEBPA, CHEK2, CTNNA1, DICER1, DIS3L2, EGFR (c.2369C>T, p.Thr790Met variant only), EPCAM (Deletion/duplication testing only), FH, FLCN, GATA2, GPC3, GREM1 (Promoter region deletion/duplication testing only),  HOXB13 (c.251G>A, p.Gly84Glu), HRAS, KIT, MAX, MEN1, MET, MITF (c.952G>A, p.Glu318Lys variant only), MLH1, MSH2, MSH3, MSH6, MUTYH, NBN, NF1, NF2, NTHL1, PALB2, PDGFRA, PHOX2B, PMS2, POLD1, POLE, POT1, PRKAR1A, PTCH1, PTEN, RAD50, RAD51C, RAD51D, RB1, RECQL4, RET, RNF43, RUNX1, SDHAF2, SDHA (sequence changes only), SDHB, SDHC, SDHD, SMAD4, SMARCA4, SMARCB1, SMARCE1, STK11, SUFU, TERC, TERT, TMEM127, TP53, TSC1, TSC2, VHL, WRN and WT1.  The report date is November 25, 2018.   12/02/2018 Surgery   Right lumpectomy ([name] ([phone]: IDC with DCIS, grade 1, 1.4cm, 4 axillary lymph nodes negative   12/15/2018 Oncotype testing   Oncotype: score of 20, 6% of distant recurrence in 9 years with tamoxifen alone   01/28/2019 - 03/31/2019 Radiation Therapy   The patient initially received a dose of 50.4 Gy in 28 fractions to the breast using whole-breast tangent fields. This was delivered using a 3-D conformal technique. The patient then received a boost to the seroma. This delivered an additional 10 Gy in 5 fractions using a 3-field photon boost technique. The total dose was 60.4 Gy.   03/2019 - 03/2024 Anti-estrogen oral therapy   Tamoxifen     CHIEF COMPLIANT: Follow-up of right breast cancer on tamoxifen  INTERVAL HISTORY: Carol HOFACKERis a 45y.o. with above-mentioned history of right breast cancer who underwent a lumpectomy, radiation, and is currently on antiestrogen therapy with tamoxifen. Mammogram on 12/27/19 showed no evidence of malignancy bilaterally. She presents to the clinic today for follow-up.  Her major complaints are related to severe hot flashes.  Does not have any lumps or nodules in the breast.  ALLERGIES:  is allergic to shrimp [shellfish allergy] and tomato.  MEDICATIONS:  Current Outpatient Medications  Medication Sig Dispense Refill  acetaminophen (TYLENOL) 500 MG tablet Take 500 mg by mouth every 6 (six) hours as needed for moderate pain or mild pain.     albuterol (VENTOLIN  HFA) 108 (90 Base) MCG/ACT inhaler Inhale 2 puffs into the lungs every 4 (four) hours as needed for wheezing or shortness of breath (cough). 1 each 3   Ascorbic Acid (VITAMIN C PO) Take by mouth.     diphenhydrAMINE (BENADRYL) 25 mg capsule Take 25 mg by mouth every 6 (six) hours as needed for itching.     fluconazole (DIFLUCAN) 100 MG tablet Take 2 tablets (200 mg total) by mouth daily. 2 tablet 0   ibuprofen (ADVIL) 800 MG tablet Take 1 tablet (800 mg total) by mouth every 8 (eight) hours as needed for mild pain. 30 tablet 0   nicotine (NICODERM CQ) 14 mg/24hr patch Place 1 patch (14 mg total) onto the skin daily. For 28 days 28 patch 0   QUEtiapine (SEROQUEL) 100 MG tablet Take 1 tablet (100 mg total) by mouth at bedtime. 30 tablet 1   SPIRIVA RESPIMAT 2.5 MCG/ACT AERS Inhale 2 puffs into the lungs daily. 4 g 5   tamoxifen (NOLVADEX) 20 MG tablet Take 0.5 tablets (10 mg total) by mouth daily. 90 tablet 3   traZODone (DESYREL) 50 MG tablet Take 0.5-1 tablets (25-50 mg total) by mouth at bedtime. Start with half pill 68m, then may increase in future if needed. 30 tablet 1   No current facility-administered medications for this visit.    PHYSICAL EXAMINATION: ECOG PERFORMANCE STATUS: 1 - Symptomatic but completely ambulatory  Vitals:   10/31/20 1058  BP: 126/88  Pulse: 74  Resp: 18  Temp: (!) 97.4 F (36.3 C)  SpO2: 100%   Filed Weights   10/31/20 1058  Weight: 169 lb 3.2 oz (76.7 kg)      LABORATORY DATA:  I have reviewed the data as listed CMP Latest Ref Rng & Units 09/30/2020 08/31/2020 08/29/2020  Glucose 70 - 99 mg/dL 110(H) 91 148(H)  BUN 6 - 20 mg/dL 6 10 7   Creatinine 0.44 - 1.00 mg/dL 0.92 0.80 0.72  Sodium 135 - 145 mmol/L 138 135 136  Potassium 3.5 - 5.1 mmol/L 3.2(L) 3.8 4.2  Chloride 98 - 111 mmol/L 110 106 109  CO2 22 - 32 mmol/L 25 23 23   Calcium 8.9 - 10.3 mg/dL 8.6(L) 8.6(L) 8.4(L)  Total Protein 6.5 - 8.1 g/dL - - -  Total Bilirubin 0.3 - 1.2 mg/dL - - -   Alkaline Phos 38 - 126 U/L - - -  AST 15 - 41 U/L - - -  ALT 0 - 44 U/L - - -    Lab Results  Component Value Date   WBC 5.0 09/30/2020   HGB 12.9 09/30/2020   HCT 37.5 09/30/2020   MCV 92.6 09/30/2020   PLT 265 09/30/2020   NEUTROABS 2.3 09/30/2020    ASSESSMENT & PLAN:  Malignant neoplasm of upper-outer quadrant of right breast in [place], [place] 12/[place]:Right lumpectomy ([name]: IDC with DCIS, grade 1, 1.4cm, 4 axillary lymph nodes negative HER-2 negative (0), ER+ 51-90%, PR[facility] >90% [facility] Oncotype: score of 20, 6% of distant recurrence in 9 years with tamoxifen alone (did not recommend chemotherapy) Adj XRT 01/29/19   Current treatment: Adjuvant antiestrogen therapy with tamoxifen 20 mg daily to be started in 2 weeks.  Plan is for 10 years   Tamoxifen toxicities:  Severe hot flashes: I reduce the dosage of  tamoxifen to 10 mg daily. She does not want to take any other medications to suppress her hot flashes.  Breast cancer surveillance: Mammogram to be done October Breast exam 10/31/2020: Benign   Return to clinic in 1 year for follow-up      No orders of the defined types were placed in this encounter.  The patient has a good understanding of the overall plan. she agrees with it. she will call with any problems that may develop before the next visit here.  Total time spent: 20 mins including face to face time and time spent for planning, charting and coordination of care  Rulon Eisenmenger, MD, MPH 10/31/2020  I, Thana Ates, am acting as scribe for Dr. Nicholas Lose.  I have reviewed the above documentation for accuracy and completeness, and I agree with the above.

## 2020-10-30 NOTE — Assessment & Plan Note (Signed)
12/02/2018:Right lumpectomy Carol Crawford): IDC with DCIS, grade 1, 1.4cm, 4 axillary lymph nodes negative HER-2 negative (0), ER+ 51-90%, PR+ >90% (done at [facility]) Oncotype: score of 20, 6% of distant recurrence in 9 years with tamoxifen alone(did not recommend chemotherapy) Adj XRT 01/29/19  Current treatment: Adjuvant antiestrogen therapy with tamoxifen 20 mg daily to be started in 2 weeks.  Plan is for 10 years  Tamoxifen toxicities: Complains of hot flashes Fatigue Lack of taste  I discussed with her that the symptoms do get better with time.  She informed me that they are better than before. Mood swings: We previously sent for Effexor but she has not received it so I sent another prescription today.  She will take 37.5 mg once daily.  Radiation Dermatitis: Mild-mod  Return to clinic in 1 year for follow-up

## 2020-10-31 ENCOUNTER — Encounter: Payer: Self-pay | Admitting: *Deleted

## 2020-10-31 ENCOUNTER — Inpatient Hospital Stay: Payer: Medicare HMO | Attending: Hematology and Oncology | Admitting: Hematology and Oncology

## 2020-10-31 ENCOUNTER — Other Ambulatory Visit: Payer: Self-pay

## 2020-10-31 DIAGNOSIS — Z79810 Long term (current) use of selective estrogen receptor modulators (SERMs): Secondary | ICD-10-CM | POA: Diagnosis not present

## 2020-10-31 DIAGNOSIS — Z923 Personal history of irradiation: Secondary | ICD-10-CM | POA: Diagnosis not present

## 2020-10-31 DIAGNOSIS — Z170 Estrogen receptor positive status [ER+]: Secondary | ICD-10-CM

## 2020-10-31 DIAGNOSIS — C50411 Malignant neoplasm of upper-outer quadrant of right female breast: Secondary | ICD-10-CM

## 2020-10-31 MED ORDER — TAMOXIFEN CITRATE 20 MG PO TABS: 10.0000 mg | ORAL_TABLET | Freq: Every day | ORAL | 3 refills | Status: DC

## 2021-01-12 ENCOUNTER — Encounter: Payer: Self-pay | Admitting: Advanced Practice Midwife

## 2021-01-12 ENCOUNTER — Other Ambulatory Visit: Payer: Self-pay

## 2021-01-12 ENCOUNTER — Ambulatory Visit: Payer: Self-pay | Admitting: Advanced Practice Midwife

## 2021-01-12 DIAGNOSIS — T7411XA Adult physical abuse, confirmed, initial encounter: Secondary | ICD-10-CM | POA: Insufficient documentation

## 2021-01-12 DIAGNOSIS — Z113 Encounter for screening for infections with a predominantly sexual mode of transmission: Secondary | ICD-10-CM

## 2021-01-12 DIAGNOSIS — T7411XS Adult physical abuse, confirmed, sequela: Secondary | ICD-10-CM

## 2021-01-12 DIAGNOSIS — Z62810 Personal history of physical and sexual abuse in childhood: Secondary | ICD-10-CM | POA: Insufficient documentation

## 2021-01-12 LAB — HM HIV SCREENING LAB: HM HIV Screening: NEGATIVE

## 2021-01-12 LAB — WET PREP FOR TRICH, YEAST, CLUE
Trichomonas Exam: NEGATIVE
Yeast Exam: NEGATIVE

## 2021-01-12 LAB — HM HEPATITIS C SCREENING LAB: HM Hepatitis Screen: NEGATIVE

## 2021-01-12 NOTE — Progress Notes (Signed)
Patient here for STD testing, condoms given. Wet mount reviewed no tx per standing orders.

## 2021-01-12 NOTE — Progress Notes (Signed)
[facility] Department STI clinic/screening visit  Subjective:  Carol Crawford is a 45 y.o. SBF G7P2 smoker female being seen today for an STI screening visit. The patient reports they do not have symptoms.  Patient reports that they do not desire a pregnancy in the next year.   They reported they are not interested in discussing contraception today.  No LMP recorded. (Menstrual status: Irregular Periods).   Patient has the following medical conditions:   Patient Active Problem List   Diagnosis Date Noted   Centrilobular emphysema ([place]) 09/18/2020   Schizophrenia ([place]) 09/17/2019   Self-mutilation 2007 09/17/2019   Smoker 2-4 cpd 09/17/2019   Genetic testing 11/26/2018    history of breast cancer 09/2018 with lumpectomy 12/02/18, radiation 01/28/19-03/31/19, Tamoxifen 03/2019-03/2024    Family history of prostate cancer    Family history of colon cancer    Malignant neoplasm of upper-outer quadrant of right breast in female, estrogen receptor positive ([place]) 11/10/2018   Left sided numbness 01/09/2018   Major depressive disorder, recurrent, moderate (HCC)    Bipolar 1 disorder ([place]) with SA 2020 (OD pain pills)     Chief Complaint  Patient presents with   SEXUALLY TRANSMITTED DISEASE    HPI  Patient reports no sxs. Diagnosed with +estrogen receptor breast cancer on 09/2018 followed by radiation and Tamoxifen. LMP 11/25/20. Last MJ 2 days ago, smoking 4-10 cpd. Last vaped 6 mo ago. Last cigar 8 mo ago. Last sex 12/23/20 without condom; with current partner x 2 mo; 2 sex partners in last 3 mo.   Last HIV test per patient/review of record was 09/19/20 Patient reports last pap was 08/06/17 neg HPV neg  See flowsheet for further details and programmatic requirements.    The following portions of the patient's history were reviewed and updated as appropriate: allergies, current medications, past medical history, past social history, past surgical history and problem list.  Objective:   There were no vitals filed for this visit.  Physical Exam Vitals and nursing note reviewed.  Constitutional:      Appearance: Normal appearance.  HENT:     Head: Normocephalic and atraumatic.     Mouth/Throat:     Mouth: Mucous membranes are moist.     Pharynx: Oropharynx is clear. No oropharyngeal exudate or posterior oropharyngeal erythema.  Pulmonary:     Effort: Pulmonary effort is normal.  Abdominal:     General: Abdomen is flat.     Palpations: There is no mass.     Tenderness: There is no abdominal tenderness. There is no rebound.     Comments: Soft without masses or tenderness  Genitourinary:    General: Normal vulva.     Exam position: Lithotomy position.     Pubic Area: No rash or pubic lice.      Labia:        Right: No rash or lesion.        Left: No rash or lesion.      Vagina: Normal. No vaginal discharge (moderate thick white leukorrhea, ph<4.5), erythema, bleeding or lesions.     Cervix: Normal.     Uterus: Normal.      Adnexa: Right adnexa normal and left adnexa normal.     Rectum: Normal.  Lymphadenopathy:     Head:     Right side of head: No preauricular or posterior auricular adenopathy.     Left side of head: No preauricular or posterior auricular adenopathy.     Cervical: No cervical  adenopathy.     Right cervical: No superficial, deep or posterior cervical adenopathy.    Left cervical: No superficial, deep or posterior cervical adenopathy.     Upper Body:     Right upper body: No supraclavicular or axillary adenopathy.     Left upper body: No supraclavicular or axillary adenopathy.     Lower Body: No right inguinal adenopathy. No left inguinal adenopathy.  Skin:    General: Skin is warm and dry.     Findings: No rash.  Neurological:     Mental Status: She is alert and oriented to person, place, and time.     Assessment and Plan:  Carol Crawford is a 45 y.o. female presenting to the [facility] Department for STI screening  1.  Screening examination for venereal disease Treat wet mount per standing orders Immunization nurse consult - WET PREP FOR [place], YEAST, CLUE - Chlamydia/Gonorrhea [place] [facility] - Syphilis Serology, [place] [facility] - HIV/HCV [place] [facility]     No follow-ups on file.  Future Appointments  Date Time Provider [place]  10/31/2021 11:00 AM Nicholas Lose, MD [facility] None    Herbie Saxon, CNM

## 2021-02-21 ENCOUNTER — Other Ambulatory Visit: Payer: Self-pay | Admitting: Hematology and Oncology

## 2021-04-02 ENCOUNTER — Telehealth: Payer: Self-pay | Admitting: Family Medicine

## 2021-04-02 NOTE — Telephone Encounter (Signed)
N/A unable to leave a message for patient to call back and schedule Medicare Annual Wellness Visit (AWV) to be done virtually or by telephone.  No hx of AWV eligible as of 01/23/21  Please schedule at anytime with [facility].      40 Minutes appointment   Any questions, please call me at [phone]

## 2021-04-03 ENCOUNTER — Other Ambulatory Visit: Payer: Self-pay | Admitting: Adult Health

## 2021-04-03 DIAGNOSIS — Z9889 Other specified postprocedural states: Secondary | ICD-10-CM

## 2021-05-04 ENCOUNTER — Ambulatory Visit
Admission: RE | Admit: 2021-05-04 | Discharge: 2021-05-04 | Disposition: A | Discharge status: Home / Self Care | Payer: Medicare HMO | Source: Ambulatory Visit | Attending: Adult Health | Admitting: Adult Health

## 2021-05-04 DIAGNOSIS — Z9889 Other specified postprocedural states: Secondary | ICD-10-CM

## 2021-05-04 DIAGNOSIS — R922 Inconclusive mammogram: Secondary | ICD-10-CM | POA: Diagnosis not present

## 2021-05-30 ENCOUNTER — Telehealth: Payer: Self-pay

## 2021-05-30 NOTE — Telephone Encounter (Signed)
Attempted to contact the patient to schedule her for her medicare wellness visit. Unable to leave a message on the voicemail.  ?

## 2021-07-23 ENCOUNTER — Ambulatory Visit: Payer: Medicare HMO

## 2021-07-25 ENCOUNTER — Ambulatory Visit: Payer: Medicare HMO

## 2021-07-27 ENCOUNTER — Emergency Department (HOSPITAL_COMMUNITY)
Admission: EM | Admit: 2021-07-27 | Discharge: 2021-07-27 | Disposition: A | Discharge status: Home / Self Care | Payer: Medicare HMO | Attending: Emergency Medicine | Admitting: Emergency Medicine

## 2021-07-27 ENCOUNTER — Encounter (HOSPITAL_COMMUNITY): Payer: Self-pay | Admitting: Emergency Medicine

## 2021-07-27 ENCOUNTER — Other Ambulatory Visit: Payer: Self-pay

## 2021-07-27 DIAGNOSIS — R42 Dizziness and giddiness: Secondary | ICD-10-CM | POA: Diagnosis not present

## 2021-07-27 DIAGNOSIS — E860 Dehydration: Secondary | ICD-10-CM | POA: Insufficient documentation

## 2021-07-27 DIAGNOSIS — Z853 Personal history of malignant neoplasm of breast: Secondary | ICD-10-CM | POA: Diagnosis not present

## 2021-07-27 DIAGNOSIS — H53143 Visual discomfort, bilateral: Secondary | ICD-10-CM | POA: Diagnosis not present

## 2021-07-27 DIAGNOSIS — Z79899 Other long term (current) drug therapy: Secondary | ICD-10-CM | POA: Insufficient documentation

## 2021-07-27 DIAGNOSIS — R55 Syncope and collapse: Secondary | ICD-10-CM | POA: Diagnosis not present

## 2021-07-27 DIAGNOSIS — F1910 Other psychoactive substance abuse, uncomplicated: Secondary | ICD-10-CM

## 2021-07-27 DIAGNOSIS — R457 State of emotional shock and stress, unspecified: Secondary | ICD-10-CM | POA: Diagnosis not present

## 2021-07-27 LAB — COMPREHENSIVE METABOLIC PANEL
ALT: 17 U/L (ref 0–44)
AST: 26 U/L (ref 15–41)
Albumin: 4.1 g/dL (ref 3.5–5.0)
Alkaline Phosphatase: 71 U/L (ref 38–126)
Anion gap: 7 (ref 5–15)
BUN: 8 mg/dL (ref 6–20)
CO2: 24 mmol/L (ref 22–32)
Calcium: 8.5 mg/dL — ABNORMAL LOW (ref 8.9–10.3)
Chloride: 109 mmol/L (ref 98–111)
Creatinine, Ser: 0.71 mg/dL (ref 0.44–1.00)
GFR, Estimated: >60 mL/min (ref >60)
Glucose, Bld: 80 mg/dL (ref 70–99)
Potassium: 3 mmol/L — ABNORMAL LOW (ref 3.5–5.1)
Sodium: 140 mmol/L (ref 135–145)
Total Bilirubin: 0.7 mg/dL (ref 0.3–1.2)
Total Protein: 6.8 g/dL (ref 6.5–8.1)

## 2021-07-27 LAB — CBC WITH DIFFERENTIAL/PLATELET
Abs Immature Granulocytes: 0.01 10*3/uL (ref 0.00–0.07)
Basophils Absolute: 0 10*3/uL (ref 0.0–0.1)
Basophils Relative: 0 %
Eosinophils Absolute: 0 10*3/uL (ref 0.0–0.5)
Eosinophils Relative: 1 %
HCT: 36 % (ref 36.0–46.0)
Hemoglobin: 12.4 g/dL (ref 12.0–15.0)
Immature Granulocytes: 0 %
Lymphocytes Relative: 35 %
Lymphs Abs: 2 10*3/uL (ref 0.7–4.0)
MCH: 32.6 pg (ref 26.0–34.0)
MCHC: 34.4 g/dL (ref 30.0–36.0)
MCV: 94.7 fL (ref 80.0–100.0)
Monocytes Absolute: 0.5 10*3/uL (ref 0.1–1.0)
Monocytes Relative: 8 %
Neutro Abs: 3.1 10*3/uL (ref 1.7–7.7)
Neutrophils Relative %: 56 %
Platelets: 239 10*3/uL (ref 150–400)
RBC: 3.8 MIL/uL — ABNORMAL LOW (ref 3.87–5.11)
RDW: 12.9 % (ref 11.5–15.5)
WBC: 5.6 10*3/uL (ref 4.0–10.5)
nRBC: 0 % (ref 0.0–0.2)

## 2021-07-27 LAB — RAPID URINE DRUG SCREEN, HOSP PERFORMED
Amphetamines: NOT DETECTED
Barbiturates: NOT DETECTED
Benzodiazepines: NOT DETECTED
Cocaine: POSITIVE — AB
Opiates: NOT DETECTED
Tetrahydrocannabinol: POSITIVE — AB

## 2021-07-27 LAB — SALICYLATE LEVEL: Salicylate Lvl: <7 mg/dL — ABNORMAL LOW (ref 7.0–30.0)

## 2021-07-27 LAB — ETHANOL: Alcohol, Ethyl (B): 36 mg/dL — ABNORMAL HIGH (ref <10)

## 2021-07-27 LAB — ACETAMINOPHEN LEVEL: Acetaminophen (Tylenol), Serum: <10 ug/mL — ABNORMAL LOW (ref 10–30)

## 2021-07-27 MED ORDER — SODIUM CHLORIDE 0.9 % IV BOLUS
1000.0000 mL | Freq: Once | INTRAVENOUS | Status: AC
  Administered 2021-07-27: 1000 mL via INTRAVENOUS

## 2021-07-27 MED ORDER — ACETAMINOPHEN 500 MG PO TABS
1000.0000 mg | ORAL_TABLET | Freq: Once | ORAL | Status: AC
  Administered 2021-07-27: 1000 mg via ORAL
  Filled 2021-07-27: qty 2

## 2021-07-27 MED ORDER — POTASSIUM CHLORIDE CRYS ER 20 MEQ PO TBCR
40.0000 meq | EXTENDED_RELEASE_TABLET | Freq: Once | ORAL | Status: AC
Start: 2021-07-27 — End: 2021-07-27
  Administered 2021-07-27: 40 meq via ORAL
  Filled 2021-07-27: qty 2

## 2021-07-27 NOTE — ED Notes (Signed)
Given crackers and water ?

## 2021-07-27 NOTE — ED Notes (Signed)
Visual acuity  ?RT eye 20/50 ?Left eye 20/40 ?(Glasses were not worn for visual acuity test) ?

## 2021-07-27 NOTE — Discharge Instructions (Signed)
Urine drug testing was positive for marijuana and cocaine, you still had a small amount of alcohol in your system, all of these things together could be causing your symptoms.  Your blood pressure did go a little bit low when you stood up, this could be indicative of being slightly dehydrated so we have given you IV fluids.  Your potassium level was also a little bit low so we gave you a supplement.  Please make sure you are eating a regular diet and having your family doctor recheck your blood work within 1 week.   ? ?Please avoid cocaine use, marijuana use and alcohol as these things together can be quite dangerous and even life-threatening ? ?ER for worsening symptoms. ?

## 2021-07-27 NOTE — ED Notes (Signed)
Pt c/o dizziness
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
## 2021-07-27 NOTE — ED Notes (Signed)
Pt verbalized she fell this afternoon when she was walking. She saw dots and everything started spinning and she fell. Pt does not know if she hit her head.  ?

## 2021-08-14 ENCOUNTER — Telehealth: Payer: Medicare HMO | Admitting: Family Medicine

## 2021-09-04 ENCOUNTER — Other Ambulatory Visit: Payer: Self-pay | Admitting: Family Medicine

## 2021-09-04 DIAGNOSIS — F331 Major depressive disorder, recurrent, moderate: Secondary | ICD-10-CM

## 2021-09-04 DIAGNOSIS — J432 Centrilobular emphysema: Secondary | ICD-10-CM

## 2021-09-04 MED ORDER — TRAZODONE HCL 50 MG PO TABS: 25.0000 mg | ORAL_TABLET | Freq: Every day | ORAL | 0 refills | Status: DC

## 2021-09-04 MED ORDER — QUETIAPINE FUMARATE 100 MG PO TABS: 100.0000 mg | ORAL_TABLET | Freq: Every day | ORAL | 1 refills | Status: DC

## 2021-09-04 MED ORDER — SPIRIVA RESPIMAT 2.5 MCG/ACT IN AERS: 2.0000 | INHALATION_SPRAY | Freq: Every day | RESPIRATORY_TRACT | 2 refills | Status: DC

## 2021-09-04 MED ORDER — ALBUTEROL SULFATE HFA 108 (90 BASE) MCG/ACT IN AERS: 2.0000 | INHALATION_SPRAY | RESPIRATORY_TRACT | 2 refills | Status: DC | PRN

## 2021-09-04 NOTE — Telephone Encounter (Signed)
Medication Refill - Medication: traZODone (DESYREL) 50 MG tablet, tamoxifen (NOLVADEX) 20 MG tablet, SPIRIVA RESPIMAT 2.5 MCG/ACT AERS, QUEtiapine (SEROQUEL) 100 MG tablet, albuterol (VENTOLIN HFA) 108 (90 Base) MCG/ACT inhaler  Has the patient contacted their pharmacy? No. Patient was informed to contact her pharmacy for future refills  Preferred Pharmacy (with phone number or street name):  [place], [place] - [id number] [place] [address] [place] Phone:  [phone]  Fax:  [phone]     Has the patient been seen for an appointment in the last year OR does the patient have an upcoming appointment? Yes.    Agent: Please be advised that RX refills may take up to 3 business days. We ask that you follow-up with your pharmacy.

## 2021-09-04 NOTE — Telephone Encounter (Signed)
Requested Prescriptions  Pending Prescriptions Disp Refills  . traZODone (DESYREL) 50 MG tablet 30 tablet 1    Sig: Take 0.5-1 tablets (25-50 mg total) by mouth at bedtime. Start with half pill '25mg'$ , then may increase in future if needed.     Psychiatry: Antidepressants - Serotonin Modulator Failed - 09/04/2021 12:04 PM      Failed - Valid encounter within last 6 months    Recent Outpatient Visits          11 months ago Major depressive disorder, recurrent, moderate ([place])   [place], DO      Future Appointments            In 2 days Parks Ranger, Devonne Doughty, DO [place] - Completed PHQ-2 or PHQ-9 in the last 360 days      . tamoxifen (NOLVADEX) 20 MG tablet 90 tablet 3    Sig: Take 0.5 tablets (10 mg total) by mouth daily.     Off-Protocol Failed - 09/04/2021 12:04 PM      Failed - Medication not assigned to a protocol, review manually.      Passed - Valid encounter within last 12 months    Recent Outpatient Visits          11 months ago Major depressive disorder, recurrent, moderate ([place])   [facility] Parks Ranger, Devonne Doughty, DO      Future Appointments            In 2 days Parks Ranger, Devonne Doughty, DO [facility], [place]           . QUEtiapine (SEROQUEL) 100 MG tablet 30 tablet 1    Sig: Take 1 tablet (100 mg total) by mouth at bedtime.     Not Delegated - Psychiatry:  Antipsychotics - Second Generation (Atypical) - quetiapine Failed - 09/04/2021 12:04 PM      Failed - This refill cannot be delegated      Failed - TSH in normal range and within 360 days    No results found for: "TSH", "[place]", "[place]"       Failed - Valid encounter within last 6 months    Recent Outpatient Visits          11 months ago Major depressive disorder, recurrent, moderate ([place])   [place], DO      Future Appointments             In 2 days [place], DO [facility]           Failed - Lipid Panel in normal range within the last 12 months    Cholesterol  Date Value Ref Range Status  01/09/2018 180 0 - 200 mg/dL Final   LDL Cholesterol  Date Value Ref Range Status  01/09/2018 108 (H) 0 - 99 mg/dL Final    Comment:           Total Cholesterol/HDL:CHD Risk Coronary Heart Disease Risk Table                     Men   Women  1/2 Average Risk   3.4   3.3  Average Risk       5.0   4.4  2 X Average Risk   9.6   7.1  3 X Average Risk  23.4  11.0        Use the calculated Patient Ratio above and the CHD Risk Table to determine the patient's CHD Risk.        ATP III CLASSIFICATION (LDL):  <100     mg/dL   Optimal  100-129  mg/dL   Near or Above                    Optimal  130-159  mg/dL   Borderline  160-189  mg/dL   High  >190     mg/dL   Very High Performed at [facility], [address] [place], [place] [address]    HDL  Date Value Ref Range Status  01/09/2018 59 >40 mg/dL Final   Triglycerides  Date Value Ref Range Status  01/09/2018 65 <150 mg/dL Final         Passed - Completed PHQ-2 or PHQ-9 in the last 360 days      Passed - Last BP in normal range    BP Readings from Last 1 Encounters:  07/27/21 127/79         Passed - Last Heart Rate in normal range    Pulse Readings from Last 1 Encounters:  07/27/21 (!) 56         Passed - CBC within normal limits and completed in the last 12 months    WBC  Date Value Ref Range Status  07/27/2021 5.6 4.0 - 10.5 K/uL Final   RBC  Date Value Ref Range Status  07/27/2021 3.80 (L) 3.87 - 5.11 MIL/uL Final   Hemoglobin  Date Value Ref Range Status  07/27/2021 12.4 12.0 - 15.0 g/dL Final   HCT  Date Value Ref Range Status  07/27/2021 36.0 36.0 - 46.0 % Final   MCHC  Date Value Ref Range Status  07/27/2021 34.4 30.0 - 36.0 g/dL Final   [facility]  Date Value Ref Range Status   07/27/2021 32.6 26.0 - 34.0 pg Final   MCV  Date Value Ref Range Status  07/27/2021 94.7 80.0 - 100.0 fL Final   No results found for: "PLTCOUNTKUC", "LABPLAT", "POCPLA" RDW  Date Value Ref Range Status  07/27/2021 12.9 11.5 - 15.5 % Final         Passed - CMP within normal limits and completed in the last 12 months    Albumin  Date Value Ref Range Status  07/27/2021 4.1 3.5 - 5.0 g/dL Final   Alkaline Phosphatase  Date Value Ref Range Status  07/27/2021 71 38 - 126 U/L Final   ALT  Date Value Ref Range Status  07/27/2021 17 0 - 44 U/L Final   AST  Date Value Ref Range Status  07/27/2021 26 15 - 41 U/L Final   BUN  Date Value Ref Range Status  07/27/2021 8 6 - 20 mg/dL Final   Calcium  Date Value Ref Range Status  07/27/2021 8.5 (L) 8.9 - 10.3 mg/dL Final   CO2  Date Value Ref Range Status  07/27/2021 24 22 - 32 mmol/L Final   Creatinine, Ser  Date Value Ref Range Status  07/27/2021 0.71 0.44 - 1.00 mg/dL Final   Glucose, Bld  Date Value Ref Range Status  07/27/2021 80 70 - 99 mg/dL Final    Comment:    Glucose reference range applies only to samples taken after fasting for at least 8 hours.   Glucose-Capillary  Date Value Ref Range Status  01/09/2018 95 70 - 99 mg/dL Final  Potassium  Date Value Ref Range Status  07/27/2021 3.0 (L) 3.5 - 5.1 mmol/L Final   Sodium  Date Value Ref Range Status  07/27/2021 140 135 - 145 mmol/L Final   Total Bilirubin  Date Value Ref Range Status  07/27/2021 0.7 0.3 - 1.2 mg/dL Final   Protein, ur  Date Value Ref Range Status  01/09/2018 NEGATIVE NEGATIVE mg/dL Final   Total Protein  Date Value Ref Range Status  07/27/2021 6.8 6.5 - 8.1 g/dL Final   GFR calc Af Amer  Date Value Ref Range Status  12/13/2018 >60 >60 mL/min Final   GFR, Estimated  Date Value Ref Range Status  07/27/2021 >60 >60 mL/min Final    Comment:    (NOTE) Calculated using the CKD-EPI Creatinine Equation (2021)          .  albuterol (VENTOLIN HFA) 108 (90 Base) MCG/ACT inhaler 1 each 3    Sig: Inhale 2 puffs into the lungs every 4 (four) hours as needed for wheezing or shortness of breath (cough).     Pulmonology:  Beta Agonists 2 Passed - 09/04/2021 12:04 PM      Passed - Last BP in normal range    BP Readings from Last 1 Encounters:  07/27/21 127/79         Passed - Last Heart Rate in normal range    Pulse Readings from Last 1 Encounters:  07/27/21 (!) 56         Passed - Valid encounter within last 12 months    Recent Outpatient Visits          11 months ago Major depressive disorder, recurrent, moderate ([place])   [facility], Devonne Doughty, DO      Future Appointments            In 2 days Parks Ranger, Devonne Doughty, DO [facility], [place]           . SPIRIVA RESPIMAT 2.5 MCG/ACT AERS 4 g 5    Sig: Inhale 2 puffs into the lungs daily.     Pulmonology:  Anticholinergic Agents Passed - 09/04/2021 12:04 PM      Passed - Valid encounter within last 12 months    Recent Outpatient Visits          11 months ago Major depressive disorder, recurrent, moderate ([place])   [facility] Parks Ranger, Devonne Doughty, DO      Future Appointments            In 2 days Parks Ranger, Devonne Doughty, DO [facility], [facility]

## 2021-09-04 NOTE — Telephone Encounter (Signed)
Requested medications are due for refill today.  yes  Requested medications are on the active medications list.  yes  Last refill. Tamoifen refilled 10/31/2020 #90 3 refills, Seroquel 09/18/2020 #30 1 refill  Future visit scheduled.   yes  Notes to clinic.  Medication refill are not delegated.    Requested Prescriptions  Pending Prescriptions Disp Refills   tamoxifen (NOLVADEX) 20 MG tablet 90 tablet 3    Sig: Take 0.5 tablets (10 mg total) by mouth daily.     Off-Protocol Failed - 09/04/2021 12:04 PM      Failed - Medication not assigned to a protocol, review manually.      Passed - Valid encounter within last 12 months    Recent Outpatient Visits           11 months ago Major depressive disorder, recurrent, moderate ([place])   [place], DO       Future Appointments             In 2 days Parks Ranger, Devonne Doughty, DO [facility], [facility]             QUEtiapine (SEROQUEL) 100 MG tablet 30 tablet 1    Sig: Take 1 tablet (100 mg total) by mouth at bedtime.     Not Delegated - Psychiatry:  Antipsychotics - Second Generation (Atypical) - quetiapine Failed - 09/04/2021 12:04 PM      Failed - This refill cannot be delegated      Failed - TSH in normal range and within 360 days    No results found for: "TSH", "[place]", "[place]"       Failed - Valid encounter within last 6 months    Recent Outpatient Visits           11 months ago Major depressive disorder, recurrent, moderate ([place])   [place], DO       Future Appointments             In 2 days [place], DO [facility], [facility]            Failed - Lipid Panel in normal range within the last 12 months    Cholesterol  Date Value Ref Range Status  01/09/2018 180 0 - 200 mg/dL Final   LDL Cholesterol  Date Value Ref Range Status  01/09/2018 108 (H) 0 - 99 mg/dL Final    Comment:            Total Cholesterol/HDL:CHD Risk Coronary Heart Disease Risk Table                     Men   Women  1/2 Average Risk   3.4   3.3  Average Risk       5.0   4.4  2 X Average Risk   9.6   7.1  3 X Average Risk  23.4   11.0        Use the calculated Patient Ratio above and the CHD Risk Table to determine the patient's CHD Risk.        ATP III CLASSIFICATION (LDL):  <100     mg/dL   Optimal  100-129  mg/dL   Near or Above                    Optimal  130-159  mg/dL   Borderline  160-189  mg/dL  High  >190     mg/dL   Very High Performed at [facility], [address] [place], [place] [address]    HDL  Date Value Ref Range Status  01/09/2018 59 >40 mg/dL Final   Triglycerides  Date Value Ref Range Status  01/09/2018 65 <150 mg/dL Final         Passed - Completed PHQ-2 or PHQ-9 in the last 360 days      Passed - Last BP in normal range    BP Readings from Last 1 Encounters:  07/27/21 127/79         Passed - Last Heart Rate in normal range    Pulse Readings from Last 1 Encounters:  07/27/21 (!) 56         Passed - CBC within normal limits and completed in the last 12 months    WBC  Date Value Ref Range Status  07/27/2021 5.6 4.0 - 10.5 K/uL Final   RBC  Date Value Ref Range Status  07/27/2021 3.80 (L) 3.87 - 5.11 MIL/uL Final   Hemoglobin  Date Value Ref Range Status  07/27/2021 12.4 12.0 - 15.0 g/dL Final   HCT  Date Value Ref Range Status  07/27/2021 36.0 36.0 - 46.0 % Final   MCHC  Date Value Ref Range Status  07/27/2021 34.4 30.0 - 36.0 g/dL Final   [facility]  Date Value Ref Range Status  07/27/2021 32.6 26.0 - 34.0 pg Final   MCV  Date Value Ref Range Status  07/27/2021 94.7 80.0 - 100.0 fL Final   No results found for: "PLTCOUNTKUC", "LABPLAT", "POCPLA" RDW  Date Value Ref Range Status  07/27/2021 12.9 11.5 - 15.5 % Final         Passed - CMP within normal limits and completed in the last 12 months    Albumin  Date Value Ref  Range Status  07/27/2021 4.1 3.5 - 5.0 g/dL Final   Alkaline Phosphatase  Date Value Ref Range Status  07/27/2021 71 38 - 126 U/L Final   ALT  Date Value Ref Range Status  07/27/2021 17 0 - 44 U/L Final   AST  Date Value Ref Range Status  07/27/2021 26 15 - 41 U/L Final   BUN  Date Value Ref Range Status  07/27/2021 8 6 - 20 mg/dL Final   Calcium  Date Value Ref Range Status  07/27/2021 8.5 (L) 8.9 - 10.3 mg/dL Final   CO2  Date Value Ref Range Status  07/27/2021 24 22 - 32 mmol/L Final   Creatinine, Ser  Date Value Ref Range Status  07/27/2021 0.71 0.44 - 1.00 mg/dL Final   Glucose, Bld  Date Value Ref Range Status  07/27/2021 80 70 - 99 mg/dL Final    Comment:    Glucose reference range applies only to samples taken after fasting for at least 8 hours.   Glucose-Capillary  Date Value Ref Range Status  01/09/2018 95 70 - 99 mg/dL Final   Potassium  Date Value Ref Range Status  07/27/2021 3.0 (L) 3.5 - 5.1 mmol/L Final   Sodium  Date Value Ref Range Status  07/27/2021 140 135 - 145 mmol/L Final   Total Bilirubin  Date Value Ref Range Status  07/27/2021 0.7 0.3 - 1.2 mg/dL Final   Protein, ur  Date Value Ref Range Status  01/09/2018 NEGATIVE NEGATIVE mg/dL Final   Total Protein  Date Value Ref Range Status  07/27/2021 6.8 6.5 - 8.1 g/dL Final  GFR calc Af Amer  Date Value Ref Range Status  12/13/2018 >60 >60 mL/min Final   GFR, Estimated  Date Value Ref Range Status  07/27/2021 >60 >60 mL/min Final    Comment:    (NOTE) Calculated using the CKD-EPI Creatinine Equation (2021)          Signed Prescriptions Disp Refills   traZODone (DESYREL) 50 MG tablet 90 tablet 0    Sig: Take 0.5-1 tablets (25-50 mg total) by mouth at bedtime. Start with half pill '25mg'$ , then may increase in future if needed.     Psychiatry: Antidepressants - Serotonin Modulator Failed - 09/04/2021 12:04 PM      Failed - Valid encounter within last 6 months    Recent  Outpatient Visits           11 months ago Major depressive disorder, recurrent, moderate ([place])   [place], DO       Future Appointments             In 2 days Parks Ranger, Devonne Doughty, DO [facility], [place] - Completed PHQ-2 or PHQ-9 in the last 360 days       albuterol (VENTOLIN HFA) 108 (90 Base) MCG/ACT inhaler 1 each 2    Sig: Inhale 2 puffs into the lungs every 4 (four) hours as needed for wheezing or shortness of breath (cough).     Pulmonology:  Beta Agonists 2 Passed - 09/04/2021 12:04 PM      Passed - Last BP in normal range    BP Readings from Last 1 Encounters:  07/27/21 127/79         Passed - Last Heart Rate in normal range    Pulse Readings from Last 1 Encounters:  07/27/21 (!) 56         Passed - Valid encounter within last 12 months    Recent Outpatient Visits           11 months ago Major depressive disorder, recurrent, moderate ([place])   [place], DO       Future Appointments             In 2 days Parks Ranger, Devonne Doughty, DO [facility], [facility]             SPIRIVA RESPIMAT 2.5 MCG/ACT AERS 4 g 2    Sig: Inhale 2 puffs into the lungs daily.     Pulmonology:  Anticholinergic Agents Passed - 09/04/2021 12:04 PM      Passed - Valid encounter within last 12 months    Recent Outpatient Visits           11 months ago Major depressive disorder, recurrent, moderate ([place])   [facility] Parks Ranger, Devonne Doughty, DO       Future Appointments             In 2 days Parks Ranger, Devonne Doughty, DO [facility], [facility]             s

## 2021-09-05 MED ORDER — TAMOXIFEN CITRATE 20 MG PO TABS: 10.0000 mg | ORAL_TABLET | Freq: Every day | ORAL | 3 refills | Status: DC

## 2021-09-05 NOTE — Patient Instructions (Signed)

## 2021-09-05 NOTE — Progress Notes (Signed)
Subjective:   I connected with  Carol Crawford on 09/06/2021 by a audio enabled telemedicine application and verified that I am speaking with the correct person using two identifiers.  Patient Location: Home  Provider Location: Office/Clinic  I discussed the limitations of evaluation and management by telemedicine. The patient expressed understanding and agreed to proceed.    Carol Crawford is a 46 y.o. female who presents for Medicare Annual (Subsequent) preventive examination.  Review of Systems    Per HPI unless specifically indicated above         Objective:    Today's Vitals   09/06/21 1023  PainSc: 2    There is no height or weight on file to calculate BMI.     07/27/2021    3:52 PM 09/30/2020    7:18 PM 08/28/2020    4:05 PM 04/12/2020   10:44 AM 08/03/2019    3:08 PM 12/17/2018   12:40 PM 12/13/2018    7:58 PM  Advanced Directives  Does Patient Have a Medical Advance Directive? No No No No No No No  Would patient like information on creating a medical advance directive? No - Patient declined  No - Patient declined Yes (MAU/Ambulatory/Procedural Areas - Information given) No - Patient declined No - Patient declined No - Guardian declined    Current Medications (verified) Outpatient Encounter Medications as of 09/06/2021  Medication Sig   acetaminophen (TYLENOL) 500 MG tablet Take 500 mg by mouth every 6 (six) hours as needed for moderate pain or mild pain.   albuterol (VENTOLIN HFA) 108 (90 Base) MCG/ACT inhaler Inhale 2 puffs into the lungs every 4 (four) hours as needed for wheezing or shortness of breath (cough).   Ascorbic Acid (VITAMIN C PO) Take by mouth.   ibuprofen (ADVIL) 400 MG tablet Take 800 mg by mouth every 6 (six) hours as needed.   SPIRIVA RESPIMAT 2.5 MCG/ACT AERS Inhale 2 puffs into the lungs daily.   nicotine (NICODERM CQ) 14 mg/24hr patch Place 1 patch (14 mg total) onto the skin daily. For 28 days (Patient not taking: Reported on 09/06/2021)    QUEtiapine (SEROQUEL) 100 MG tablet Take 1 tablet (100 mg total) by mouth at bedtime. (Patient not taking: Reported on 09/06/2021)   tamoxifen (NOLVADEX) 20 MG tablet Take 0.5 tablets (10 mg total) by mouth daily. (Patient not taking: Reported on 09/06/2021)   traZODone (DESYREL) 50 MG tablet Take 0.5-1 tablets (25-50 mg total) by mouth at bedtime. Start with half pill '25mg'$ , then may increase in future if needed. (Patient not taking: Reported on 09/06/2021)   No facility-administered encounter medications on file as of 09/06/2021.    Allergies (verified) Shrimp [shellfish allergy] and Tomato   History: Past Medical History:  Diagnosis Date   ADHD    Bipolar 1 disorder ([place])    Breast cancer ([place])    currently in treatment for   Breast disorder    right breast cancer   COPD (chronic obstructive pulmonary disease) ([place])    Depressed    Drug overdose 2010   Family history of breast cancer    Family history of colon cancer    Family history of prostate cancer    GERD (gastroesophageal reflux disease)    OCC   Headache    MIGRAINES (None for 2 months)   History of MRSA infection    foot infection   Personal history of radiation therapy 01/28/2019   right breast   Schizophrenia ([place])  Stroke [facility])    after foot surgery, left side was numb, fully recovered   Past Surgical History:  Procedure Laterality Date   BREAST BIOPSY Right 10/21/2018   Affirm Bx-"X" clip-path pending   BREAST LUMPECTOMY Right 12/02/2018   malignant   BREAST LUMPECTOMY WITH RADIOACTIVE SEED AND SENTINEL LYMPH NODE BIOPSY Right 12/02/2018   Procedure: RIGHT BREAST LUMPECTOMY WITH RADIOACTIVE SEED AND SENTINEL LYMPH NODE BIOPSY;  Surgeon: Jovita Kussmaul, MD;  Location: [place];  Service: General;  Laterality: Right;   INCISION AND DRAINAGE Left 01/09/2018   Procedure: INCISION AND DRAINAGE-below fascia foot; multiple;  Surgeon: [name], DPM;  Location: [facility];  Service: Podiatry;   Laterality: Left;   INCISION AND DRAINAGE Left 04/17/2018   Procedure: I&D; BELOW FASCIA FOOT; MULTIPLE;  Surgeon: [name], DPM;  Location: [facility];  Service: Podiatry;  Laterality: Left;   INCISION AND DRAINAGE Right 12/18/2018   Procedure: PLACEMENT OF DRAIN IN RIGHT AXILLA;  Surgeon: Jovita Kussmaul, MD;  Location: [place];  Service: General;  Laterality: Right;   INCISION AND DRAINAGE Left 04/12/2020   Procedure: INCISION AND DRAINAGE BELOW FASCIA FOOT, SINGLE LEFT;  Surgeon: [name], DPM;  Location: [place];  Service: Podiatry;  Laterality: Left;  leave first for transportation   [place] Left 09/05/2017   Procedure: REPAIR EXTENSOR TENDON;  Surgeon: [name], DPM;  Location: [facility];  Service: Podiatry;  Laterality: Left;   Family History  Problem Relation Age of Onset   Arthritis Mother    Hypertension Mother    Cervical cancer Mother    Prostate cancer Father    Lung cancer Father    Diabetes Paternal Aunt    Breast cancer Paternal Aunt    ADD / ADHD Daughter    Bipolar disorder Daughter    Hypertension Maternal Grandmother    Stroke Maternal Grandmother    Prostate cancer Maternal Grandfather    Hypertension Maternal Grandfather    Colon cancer Maternal Grandfather    Hypertension Paternal Grandfather    Stroke Paternal Grandfather    Breast cancer Cousin    Social History   Socioeconomic History   Marital status: Single    Spouse name: Not on file   Number of children: 2   Years of education: Not on file   Highest education level: Not on file  Occupational History   Not on file  Tobacco Use   Smoking status: Every Day    Packs/day: 0.25    Years: 30.00    Total pack years: 7.50    Types: Cigarettes, E-cigarettes   Smokeless tobacco: Never   Tobacco comments:    6 cigs daily  Vaping Use   Vaping Use: Former   Start date: 08/22/2017  Substance and Sexual Activity   Alcohol use: Yes    Alcohol/week: 2.0 standard  drinks of alcohol    Types: 2 Cans of beer per week   Drug use: Yes    Types: Marijuana    Comment: last use 01/10/21 2x/wk   Sexual activity: Yes    Partners: Male  Other Topics Concern   Not on file  Social History Narrative   Not on file   Social Determinants of Health   Financial Resource Strain: High Risk (09/06/2021)   Overall Financial Resource Strain (CARDIA)    Difficulty of Paying Living Expenses: Hard  Food Insecurity: Food Insecurity Present (09/06/2021)   Hunger Vital Sign    Worried About Running  Out of Food in the Last Year: Sometimes true    Ran Out of Food in the Last Year: Sometimes true  Transportation Needs: Unmet Transportation Needs (09/06/2021)   PRAPARE - Transportation    Lack of Transportation (Medical): Yes    Lack of Transportation (Non-Medical): Yes  Physical Activity: Insufficiently Active (09/06/2021)   Exercise Vital Sign    Days of Exercise per Week: 7 days    Minutes of Exercise per Session: 20 min  Stress: Stress Concern Present (09/06/2021)   [place]    Feeling of Stress : Very much  Social Connections: Unknown (09/06/2021)   Social Connection and Isolation Panel [NHANES]    Frequency of Communication with Friends and Family: More than three times a week    Frequency of Social Gatherings with Friends and Family: More than three times a week    Attends Religious Services: 1 to 4 times per year    Active Member of [employer] or Organizations: No    Attends Music therapist: 1 to 4 times per year    Marital Status: Not on file    Tobacco Counseling Ready to quit: Not Answered Counseling given: Not Answered Tobacco comments: 6 cigs daily   Clinical Intake:  Pre-visit preparation completed: No  Pain : 0-10 Pain Score: 2  Pain Type: Acute pain Pain Location: Neck Pain Descriptors / Indicators: Aching Pain Onset: Yesterday Pain Relieving Factors: pt had several  bee stings on yesterday after mowing the grass on yesterday. She went to the emergency room on yesterday for treatment  Pain Relieving Factors: pt had several bee stings on yesterday after mowing the grass on yesterday. She went to the emergency room on yesterday for treatment        Diabetic?No          Activities of Daily Living     No data to display          Patient Care Team: Olin Hauser, DO as PCP - General (Family Medicine) Rico Junker, RN as Registered Nurse Theodore Demark, RN (Inactive) as Registered Nurse Jovita Kussmaul, MD as Consulting Physician (General Surgery) Nicholas Lose, MD as Consulting Physician (Hematology and Oncology) Kyung Rudd, MD as Consulting Physician (Radiation Oncology)  Indicate any recent Medical Services you may have received from other than [facility] providers in the past year (date may be approximate).  Pt [facility] at [facility] ER on 07/27/2021 for Polysubstance abuse    Assessment:   This is a routine wellness examination for [place].  Hearing/Vision screen No results found.  Dietary issues and exercise activities discussed: Current Exercise Habits: Structured exercise class, Type of exercise: walking, Time (Minutes): 20, Frequency (Times/Week): 7, Weekly Exercise (Minutes/Week): 140, Intensity: Mild   Goals Addressed   None    Depression Screen    09/06/2021   10:11 AM 09/18/2020   10:13 AM 09/17/2019    1:55 PM 09/17/2019   11:44 AM  PHQ 2/9 Scores  PHQ - 2 Score '4 4 5 5  '$ PHQ- 9 Score '16 16 14     '$ Fall Risk    09/06/2021   10:10 AM 09/18/2020   10:12 AM  Fall Risk   Falls in the past year? 1 1  Number falls in past yr: 0 1  Injury with Fall? 1 0  Risk for fall due to : Impaired mobility   Follow up Falls evaluation completed Falls evaluation completed  FALL RISK PREVENTION PERTAINING TO THE HOME:  Any stairs in or around the home? No  If so, are there any without handrails? No  Home free of loose  throw rugs in walkways, pet beds, electrical cords, etc? No  Adequate lighting in your home to reduce risk of falls? Yes   ASSISTIVE DEVICES UTILIZED TO PREVENT FALLS:  Life alert? No  Use of a cane, walker or w/c? No  Grab bars in the bathroom? No  Shower chair or bench in shower? No  Elevated toilet seat or a handicapped toilet? No   Cognitive Function:        09/06/2021   10:21 AM  6CIT Screen  What Year? 0 points  What month? 0 points  What time? 0 points  Count back from 20 0 points  Months in reverse 0 points  Repeat phrase 0 points  Total Score 0 points    Immunizations  There is no immunization history on file for this patient.  TDAP status: Due, Education has been provided regarding the importance of this vaccine. Advised may receive this vaccine at local pharmacy or Health Dept. Aware to provide a copy of the vaccination record if obtained from local pharmacy or Health Dept. Verbalized acceptance and understanding.  Flu Vaccine status: Up to date  Pneumococcal vaccine status: Up to date  Covid-19 vaccine status: Declined, Education has been provided regarding the importance of this vaccine but patient still declined. Advised may receive this vaccine at local pharmacy or Health Dept.or vaccine clinic. Aware to provide a copy of the vaccination record if obtained from local pharmacy or Health Dept. Verbalized acceptance and understanding.  Qualifies for Shingles Vaccine? No   Zostavax completed  doesn't qualify    Shingrix Completed?: No.    Education has been provided regarding the importance of this vaccine. Patient has been advised to call insurance company to determine out of pocket expense if they have not yet received this vaccine. Advised may also receive vaccine at local pharmacy or Health Dept. Verbalized acceptance and understanding.  Screening Tests Health Maintenance  Topic Date Due   COVID-19 Vaccine (1) Never done   TETANUS/TDAP  Never done    INFLUENZA VACCINE  10/23/2021   PAP SMEAR-Modifier  08/07/2022   Hepatitis C Screening  Completed   HIV Screening  Completed   HPV VACCINES  Aged Out    Health Maintenance  Health Maintenance Due  Topic Date Due   COVID-19 Vaccine (1) Never done   TETANUS/TDAP  Never done    Colorectal Cancer : Does not qualify   Mammogram status: Completed 05/04/2021. Repeat every year  Bone Density: Does not qualify   Lung Cancer Screening: (Low Dose CT Chest recommended if Age 4-80 years, 30 pack-year currently smoking OR have quit w/in 15years.) does not qualify.   Lung Cancer Screening Referral: doesn't qualify   Additional Screening:  Hepatitis C Screening: does qualify; Completed 01/12/2021  Vision Screening: Recommended annual ophthalmology exams for early detection of glaucoma and other disorders of the eye. Is the patient up to date with their annual eye exam?  No  Who is the provider or what is the name of the office in which the patient attends annual eye exams?  If pt is not established with a provider, would they like to be referred to a provider to establish care? No .   Dental Screening: Recommended annual dental exams for proper oral hygiene  Community Resource Referral / Chronic Care Management: CRR required  this visit?  Yes   CCM required this visit?  Yes      Plan:     I have personally reviewed and noted the following in the patient's chart:   Medical and social history Use of alcohol, tobacco or illicit drugs  Current medications and supplements including opioid prescriptions.  Functional ability and status Nutritional status Physical activity Advanced directives List of other physicians Hospitalizations, surgeries, and ER visits in previous 12 months Vitals Screenings to include cognitive, depression, and falls Referrals and appointments  In addition, I have reviewed and discussed with patient certain preventive protocols, quality metrics, and best  practice recommendations. A written personalized care plan for preventive services as well as general preventive health recommendations were provided to patient.     Wilson Singer, [place]   09/06/2021   Nurse Notes: The pt reports that she has been off her medication for several weeks due to running out of medications. She also reports that she lost her wallet a few weeks ago with her insurance card, food card, and money. Now she is worried about not been able to pay her rent and utilities. She have an appt scheduled with her PCP today.

## 2021-09-06 ENCOUNTER — Ambulatory Visit (INDEPENDENT_AMBULATORY_CARE_PROVIDER_SITE_OTHER): Payer: Medicare HMO

## 2021-09-06 ENCOUNTER — Encounter: Payer: Self-pay | Admitting: Family Medicine

## 2021-09-06 ENCOUNTER — Telehealth (INDEPENDENT_AMBULATORY_CARE_PROVIDER_SITE_OTHER): Payer: Medicare HMO | Admitting: Family Medicine

## 2021-09-06 DIAGNOSIS — Z59819 Housing instability, housed unspecified: Secondary | ICD-10-CM | POA: Diagnosis not present

## 2021-09-06 DIAGNOSIS — F331 Major depressive disorder, recurrent, moderate: Secondary | ICD-10-CM

## 2021-09-06 DIAGNOSIS — Z0000 Encounter for general adult medical examination without abnormal findings: Secondary | ICD-10-CM

## 2021-09-06 MED ORDER — TRAZODONE HCL 50 MG PO TABS: 50.0000 mg | ORAL_TABLET | Freq: Every day | ORAL | 3 refills | Status: DC

## 2021-09-06 MED ORDER — QUETIAPINE FUMARATE 100 MG PO TABS: 100.0000 mg | ORAL_TABLET | Freq: Every day | ORAL | 3 refills | Status: DC

## 2021-09-06 NOTE — Progress Notes (Signed)
Virtual Visit via Telephone The purpose of this virtual visit is to provide medical care while limiting exposure to the novel coronavirus (COVID19) for both patient and office staff.  Consent was obtained for phone visit:  Yes.   Answered questions that patient had about telehealth interaction:  Yes.   I discussed the limitations, risks, security and privacy concerns of performing an evaluation and management service by telephone. I also discussed with the patient that there may be a patient responsible charge related to this service. The patient expressed understanding and agreed to proceed.  Patient Location: Home Provider Location: [facility] (Office)  Participants in virtual visit: - Patient: Carol Crawford - CMA: Orinda Kenner, CMA - Provider: Dr Alphonse Guild Visit today with Donnie Mesa CMA  ---------------------------------------------------------------------- Chief Complaint  Patient presents with   Depression    S: Reviewed CMA documentation. I have called patient and gathered additional HPI as follows:  Breast Cancer Stage 1 1 year survivor On medication Tamoxifen '20mg'$  daily. Out of med, awaiting new apt with new Oncology in [place] has apt this month, already refilled by their office.   Depression, Bipolar, Schizophrenia, Anxiety Has upcoming Therapist next week. Ran out of medications last week. Admits depression - She was on Trazodone '50mg'$ , Seroquel '100mg'$ , needs new orders.. - History of substance use in past. No longer using substances now. Rare alcohol intake only situational but no longer regularly   Centrilobular Emphysema (COPD) Tobacco Abuse Chronic smoker On Spiriva Albuterol PRN    Past Medical History:  Diagnosis Date   ADHD    Bipolar 1 disorder ([place])    Breast cancer ([place])    currently in treatment for   Breast disorder    right breast cancer   COPD (chronic obstructive pulmonary disease) ([place])    Depressed     Drug overdose 2010   Family history of breast cancer    Family history of colon cancer    Family history of prostate cancer    GERD (gastroesophageal reflux disease)    OCC   Headache    MIGRAINES (None for 2 months)   History of MRSA infection    foot infection   Personal history of radiation therapy 01/28/2019   right breast   Schizophrenia ([place])    Stroke ([place])    after foot surgery, left side was numb, fully recovered   Social History   Tobacco Use   Smoking status: Every Day    Packs/day: 0.25    Years: 30.00    Total pack years: 7.50    Types: Cigarettes, E-cigarettes   Smokeless tobacco: Never   Tobacco comments:    6 cigs daily  Vaping Use   Vaping Use: Former   Start date: 08/22/2017  Substance Use Topics   Alcohol use: Yes    Alcohol/week: 2.0 standard drinks of alcohol    Types: 2 Cans of beer per week   Drug use: Yes    Types: Marijuana    Comment: last use 01/10/21 2x/wk    Current Outpatient Medications:    acetaminophen (TYLENOL) 500 MG tablet, Take 500 mg by mouth every 6 (six) hours as needed for moderate pain or mild pain., Disp: , Rfl:    albuterol (VENTOLIN HFA) 108 (90 Base) MCG/ACT inhaler, Inhale 2 puffs into the lungs every 4 (four) hours as needed for wheezing or shortness of breath (cough)., Disp: 1 each, Rfl: 2   Ascorbic Acid (VITAMIN C PO), Take by mouth.,  Disp: , Rfl:    ibuprofen (ADVIL) 400 MG tablet, Take 800 mg by mouth every 6 (six) hours as needed., Disp: , Rfl:    SPIRIVA RESPIMAT 2.5 MCG/ACT AERS, Inhale 2 puffs into the lungs daily., Disp: 4 g, Rfl: 2   nicotine (NICODERM CQ) 14 mg/24hr patch, Place 1 patch (14 mg total) onto the skin daily. For 28 days (Patient not taking: Reported on 09/06/2021), Disp: 28 patch, Rfl: 0   QUEtiapine (SEROQUEL) 100 MG tablet, Take 1 tablet (100 mg total) by mouth at bedtime., Disp: 90 tablet, Rfl: 3   tamoxifen (NOLVADEX) 20 MG tablet, Take 0.5 tablets (10 mg total) by mouth daily. (Patient not  taking: Reported on 09/06/2021), Disp: 90 tablet, Rfl: 3   traZODone (DESYREL) 50 MG tablet, Take 1 tablet (50 mg total) by mouth at bedtime., Disp: 90 tablet, Rfl: 3     09/06/2021   10:11 AM 09/18/2020   10:13 AM 09/17/2019    1:55 PM  Depression screen PHQ 2/9  Decreased Interest '1 2 3  '$ Down, Depressed, Hopeless '3 2 2  '$ PHQ - 2 Score '4 4 5  '$ Altered sleeping '3 2 3  '$ Tired, decreased energy '3 3 3  '$ Change in appetite '3 2 2  '$ Feeling bad or failure about yourself  0 1 0  Trouble concentrating 3 2 0  Moving slowly or fidgety/restless 0 1 1  Suicidal thoughts 0 1 0  PHQ-9 Score '16 16 14  '$ Difficult doing work/chores Very difficult Very difficult Very difficult       09/06/2021   10:20 AM 09/18/2020   10:14 AM  GAD 7 : Generalized Anxiety Score  Nervous, Anxious, on Edge 3 2  Control/stop worrying 3 3  Worry too much - different things 3 3  Trouble relaxing 3 2  Restless 3 2  Easily annoyed or irritable 3 3  Afraid - awful might happen 1 2  Total GAD 7 Score 19 17  Anxiety Difficulty Very difficult Very difficult    -------------------------------------------------------------------------- O: No physical exam performed due to remote telephone encounter.  Lab results reviewed.  Recent Results (from the past 2229 hour(s))  Salicylate level     Status: Abnormal   Collection Time: 07/27/21  4:28 PM  Result Value Ref Range   Salicylate Lvl <7.9 (L) 7.0 - 30.0 mg/dL    Comment: Performed at [facility], [address], [place], [place] [address]  Acetaminophen level     Status: Abnormal   Collection Time: 07/27/21  4:28 PM  Result Value Ref Range   Acetaminophen (Tylenol), Serum <10 (L) 10 - 30 ug/mL    Comment: (NOTE) Therapeutic concentrations vary significantly. A range of 10-30 ug/mL  may be an effective concentration for many patients. However, some  are best treated at concentrations outside of this range. Acetaminophen concentrations >150 ug/mL at 4 hours after ingestion   and >50 ug/mL at 12 hours after ingestion are often associated with  toxic reactions.  Performed at [facility], [address], [place], [place] [address]   Ethanol     Status: Abnormal   Collection Time: 07/27/21  4:28 PM  Result Value Ref Range   Alcohol, Ethyl (B) 36 (H) <10 mg/dL    Comment: (NOTE) Lowest detectable limit for serum alcohol is 10 mg/dL.  For medical purposes only. Performed at [facility], [address] [place], [place] [address]   Comprehensive metabolic panel     Status: Abnormal   Collection Time: 07/27/21  4:28 PM  Result Value Ref Range   Sodium 140 135 - 145 mmol/L   Potassium 3.0 (L) 3.5 - 5.1 mmol/L   Chloride 109 98 - 111 mmol/L   CO2 24 22 - 32 mmol/L   Glucose, Bld 80 70 - 99 mg/dL    Comment: Glucose reference range applies only to samples taken after fasting for at least 8 hours.   BUN 8 6 - 20 mg/dL   Creatinine, Ser 0.71 0.44 - 1.00 mg/dL   Calcium 8.5 (L) 8.9 - 10.3 mg/dL   Total Protein 6.8 6.5 - 8.1 g/dL   Albumin 4.1 3.5 - 5.0 g/dL   AST 26 15 - 41 U/L   ALT 17 0 - 44 U/L   Alkaline Phosphatase 71 38 - 126 U/L   Total Bilirubin 0.7 0.3 - 1.2 mg/dL   GFR, Estimated >60 >60 mL/min    Comment: (NOTE) Calculated using the CKD-EPI Creatinine Equation (2021)    Anion gap 7 5 - 15    Comment: Performed at [facility], [address], [place], [place] [address]  CBC with Differential     Status: Abnormal   Collection Time: 07/27/21  4:28 PM  Result Value Ref Range   WBC 5.6 4.0 - 10.5 K/uL   RBC 3.80 (L) 3.87 - 5.11 MIL/uL   Hemoglobin 12.4 12.0 - 15.0 g/dL   HCT 36.0 36.0 - 46.0 %   MCV 94.7 80.0 - 100.0 fL   MCH 32.6 26.0 - 34.0 pg   MCHC 34.4 30.0 - 36.0 g/dL   RDW 12.9 11.5 - 15.5 %   Platelets 239 150 - 400 K/uL   nRBC 0.0 0.0 - 0.2 %   Neutrophils Relative % 56 %   Neutro Abs 3.1 1.7 - 7.7 K/uL   Lymphocytes Relative 35 %   Lymphs Abs 2.0 0.7 - 4.0 K/uL   Monocytes Relative 8 %   Monocytes Absolute 0.5 0.1 - 1.0 K/uL    Eosinophils Relative 1 %   Eosinophils Absolute 0.0 0.0 - 0.5 K/uL   Basophils Relative 0 %   Basophils Absolute 0.0 0.0 - 0.1 K/uL   Immature Granulocytes 0 %   Abs Immature Granulocytes 0.01 0.00 - 0.07 K/uL    Comment: Performed at [facility], [address], [place], [place] [address]  Rapid urine drug screen (hospital performed)     Status: Abnormal   Collection Time: 07/27/21  5:40 PM  Result Value Ref Range   Opiates NONE DETECTED NONE DETECTED   Cocaine POSITIVE (A) NONE DETECTED   Benzodiazepines NONE DETECTED NONE DETECTED   Amphetamines NONE DETECTED NONE DETECTED   Tetrahydrocannabinol POSITIVE (A) NONE DETECTED   Barbiturates NONE DETECTED NONE DETECTED    Comment: (NOTE) DRUG SCREEN FOR MEDICAL PURPOSES ONLY.  IF CONFIRMATION IS NEEDED FOR ANY PURPOSE, NOTIFY LAB WITHIN 5 DAYS.  LOWEST DETECTABLE LIMITS FOR URINE DRUG SCREEN Drug Class                     Cutoff (ng/mL) Amphetamine and metabolites    1000 Barbiturate and metabolites    200 Benzodiazepine                 387 Tricyclics and metabolites     300 Opiates and metabolites        300 Cocaine and metabolites        300 THC  54 Performed at [facility], [address] [place],  [address]     -------------------------------------------------------------------------- A&P:  Problem List Items Addressed This Visit     Major depressive disorder, recurrent, moderate (HCC)   Relevant Medications   QUEtiapine (SEROQUEL) 100 MG tablet   traZODone (DESYREL) 50 MG tablet   Chronic problem, major depression recurrent Complicated mental health history in past Has upcoming therapy visit Agree to re order meds Quetiapine '100mg'$  daily, Trazodone '50mg'$  nightly She has relocated to [place], prefers to keep our office as PCP  CCM referral was placed earlier for more assistance   Meds ordered this encounter  Medications   QUEtiapine (SEROQUEL) 100 MG tablet    Sig: Take  1 tablet (100 mg total) by mouth at bedtime.    Dispense:  90 tablet    Refill:  3   traZODone (DESYREL) 50 MG tablet    Sig: Take 1 tablet (50 mg total) by mouth at bedtime.    Dispense:  90 tablet    Refill:  3    Follow-up: - Return 3-6 months  Patient verbalizes understanding with the above medical recommendations including the limitation of remote medical advice.  Specific follow-up and call-back criteria were given for patient to follow-up or seek medical care more urgently if needed.   - Time spent in direct consultation with patient on phone: 8 minutes   Nobie Putnam, [facility] 09/06/2021, 11:46 AM

## 2021-09-06 NOTE — Patient Instructions (Signed)
° °  Please schedule a Follow-up Appointment to: No follow-ups on file. ° °If you have any other questions or concerns, please feel free to call the office or send a message through MyChart. You may also schedule an earlier appointment if necessary. ° °Additionally, you may be receiving a survey about your experience at our office within a few days to 1 week by e-mail or mail. We value your feedback. ° °Austen Oyster, DO °[facility] °

## 2021-09-07 ENCOUNTER — Telehealth: Payer: Self-pay

## 2021-09-07 NOTE — Chronic Care Management (AMB) (Signed)
  Care Management   Note  09/07/2021 Name: Carol Crawford MRN: [id number] DOB: Feb 09, 1976  Carol Crawford is a 46 y.o. year old female who is a primary care patient of Olin Hauser, DO. I reached out to Carol Crawford by phone today offer care coordination services.   [name] was given information about care management services today including:  Care management services include personalized support from designated clinical staff supervised by her physician, including individualized plan of care and coordination with other care providers 24/7 contact phone numbers for assistance for urgent and routine care needs. The patient may stop care management services at any time by phone call to the office staff.  Patient agreed to services and verbal consent obtained.   Follow up plan: Telephone appointment with care management team member scheduled for: LCSW 09/11/2021 [facility] 09/17/2021  SIGNATURE

## 2021-09-10 ENCOUNTER — Telehealth: Payer: Self-pay

## 2021-09-10 NOTE — Telephone Encounter (Signed)
   Telephone encounter was:  Unsuccessful.  09/10/2021 Name: Carol Crawford MRN: [id number] DOB: Jul 10, 1975  Unsuccessful outbound call made today to assist with:  Transportation Needs , Food Insecurity, and Financial Difficulties related to bills  Outreach Attempt:  1st Attempt  A HIPAA compliant voice message was left requesting a return call.  Instructed patient to call back at [phone] at their earliest convenience.  [facility] management  [place], [place]  Main Phone: [phone]  E-mail: [email or web address]  Website: [email or web address]

## 2021-09-11 ENCOUNTER — Telehealth: Payer: Medicare HMO

## 2021-09-11 ENCOUNTER — Telehealth: Payer: Self-pay | Admitting: Licensed Clinical Social Worker

## 2021-09-11 NOTE — Telephone Encounter (Signed)
    Clinical Social Work  Care Management   Phone Outreach    09/11/2021 Name: Carol Crawford MRN: [id number] DOB: 14-Nov-1975  Carol Crawford is a 46 y.o. year old female who is a primary care patient of Olin Hauser, DO .   Reason for referral: Mental Health Counseling and Resources.    CCM LCSW reached out to patient today by phone to introduce self, assess needs and offer Care Management services and interventions.    Telephone outreach was unsuccessful. A HIPPA compliant phone message was left for the patient providing contact information and requesting a return call.   Plan:CCM LCSW will wait for return call. If no return call is received, Will route chart to Care Guide to see if patient would like to reschedule phone appointment   Review of patient status, including review of consultants reports, relevant laboratory and other test results, and collaboration with appropriate care team members and the patient's provider was performed as part of comprehensive patient evaluation and provision of care management services.    Christa See, MSW, [facility] Care Management [email or web address] Phone [phone] 3:49 PM

## 2021-09-12 ENCOUNTER — Telehealth: Payer: Self-pay

## 2021-09-12 NOTE — Chronic Care Management (AMB) (Signed)
  Chronic Care Management   Note  09/12/2021 Name: [name] MRN: [id number] DOB: [birth year]  Carol Crawford is a 46 y.o. year old female who is a primary care patient of Olin Hauser, DO. I reached out to [employer] by phone today in response to a referral sent by Ms. [place] PCP.  Ms. Samara was given information about Chronic Care Management services today including:  CCM service includes personalized support from designated clinical staff supervised by her physician, including individualized plan of care and coordination with other care providers 24/7 contact phone numbers for assistance for urgent and routine care needs. Service will only be billed when office clinical staff spend 20 minutes or more in a month to coordinate care. Only one practitioner may furnish and bill the service in a calendar month. The patient may stop CCM services at any time (effective at the end of the month) by phone call to the office staff. The patient is responsible for co-pay (up to 20% after annual deductible is met) if co-pay is required by the individual health plan.   Patient agreed to services and verbal consent obtained.   Follow up plan: Telephone appointment with care management team member scheduled for: LCSW 09/11/2021 RNCM 09/17/2021  Noreene Larsson, [place], [place], [place] [address] Direct Dial: [phone] [email or web address]: [email or web address]

## 2021-09-12 NOTE — Chronic Care Management (AMB) (Unsigned)
  Chronic Care Management Note  09/12/2021 Name: [name] MRN: [id number] DOB: [birth year]  Carol Crawford is a 46 y.o. year old female who is a primary care patient of Olin Hauser, DO and is actively engaged with the care management team. I reached out to Carol Crawford by phone today to assist with re-scheduling a follow up visit with the Licensed Clinical Social Worker  Follow up plan: Unsuccessful telephone outreach attempt made. A HIPAA compliant phone message was left for the patient providing contact information and requesting a return call.  The care management team will reach out to the patient again over the next 3 days.  If patient returns call to provider office, please advise to call [facility]  at [place], [place], [place], [place] [address] Direct Dial: [phone] [email or web address] Website: [email or web address]

## 2021-09-12 NOTE — Progress Notes (Signed)
Error consent made in wrong context

## 2021-09-13 ENCOUNTER — Telehealth: Payer: Self-pay

## 2021-09-13 NOTE — Telephone Encounter (Signed)
   Telephone encounter was:  Successful.  09/13/2021 Name: [name] MRN: [id number] DOB: 29-Apr-1975  Carol Crawford is a 46 y.o. year old female who is a primary care patient of Olin Hauser, DO . The community resource team was consulted for assistance with Transportation Needs , Food Insecurity, and Financial Difficulties related to bills  Care guide performed the following interventions: Patient provided with information about care guide support team and interviewed to confirm resource needs. Patient advised she is working with SW on Transport planner for [employer]. Patient advised all she needs at this time is assistance with rent/utilities and would like food/meals delivered to her home.  Follow Up Plan:  Care guide will outreach resources to assist patient with needs listed above.  [place] management  [place], [place]  Main Phone: [phone]  E-mail: [email or web address]  Website: [email or web address]

## 2021-09-17 ENCOUNTER — Telehealth: Payer: Medicare HMO

## 2021-09-17 ENCOUNTER — Ambulatory Visit (INDEPENDENT_AMBULATORY_CARE_PROVIDER_SITE_OTHER): Payer: Medicare HMO

## 2021-09-17 ENCOUNTER — Telehealth: Payer: Self-pay

## 2021-09-17 DIAGNOSIS — J432 Centrilobular emphysema: Secondary | ICD-10-CM

## 2021-09-17 DIAGNOSIS — F419 Anxiety disorder, unspecified: Secondary | ICD-10-CM

## 2021-09-17 DIAGNOSIS — Z59819 Housing instability, housed unspecified: Secondary | ICD-10-CM

## 2021-09-17 DIAGNOSIS — F331 Major depressive disorder, recurrent, moderate: Secondary | ICD-10-CM

## 2021-09-17 DIAGNOSIS — F209 Schizophrenia, unspecified: Secondary | ICD-10-CM

## 2021-09-17 DIAGNOSIS — F319 Bipolar disorder, unspecified: Secondary | ICD-10-CM

## 2021-09-17 NOTE — Telephone Encounter (Signed)
  Care Management   Follow Up Note   09/17/2021 Name: Carol Crawford MRN: [id number] DOB: Mar 07, 1976   Referred by: Smitty Cords, DO Reason for referral : Chronic Care Management (RNCM: Initial Outreach for Chronic Disease Management and Care Coordination Needs)   The patient called back at appointment time and the call was completed. See new encounter.  Follow Up Plan: Telephone follow up appointment with care management team member scheduled for: 11-05-2021 at 1145 am  Alto Denver RN, MSN, CCM Community Care Coordinator [facility]  [facility] [place] Mobile: [phone]

## 2021-09-18 ENCOUNTER — Telehealth: Payer: Medicare HMO

## 2021-09-19 ENCOUNTER — Encounter (HOSPITAL_COMMUNITY): Payer: Self-pay | Admitting: Emergency Medicine

## 2021-09-19 ENCOUNTER — Other Ambulatory Visit: Payer: Self-pay

## 2021-09-19 ENCOUNTER — Emergency Department (HOSPITAL_COMMUNITY)
Admission: EM | Admit: 2021-09-19 | Discharge: 2021-09-19 | Disposition: A | Discharge status: Home / Self Care | Payer: Medicare HMO | Attending: Emergency Medicine | Admitting: Emergency Medicine

## 2021-09-19 DIAGNOSIS — Z853 Personal history of malignant neoplasm of breast: Secondary | ICD-10-CM | POA: Diagnosis not present

## 2021-09-19 DIAGNOSIS — L509 Urticaria, unspecified: Secondary | ICD-10-CM | POA: Diagnosis present

## 2021-09-19 DIAGNOSIS — Z91030 Bee allergy status: Secondary | ICD-10-CM | POA: Insufficient documentation

## 2021-09-19 DIAGNOSIS — T63441A Toxic effect of venom of bees, accidental (unintentional), initial encounter: Secondary | ICD-10-CM | POA: Diagnosis not present

## 2021-09-19 DIAGNOSIS — Z87898 Personal history of other specified conditions: Secondary | ICD-10-CM | POA: Insufficient documentation

## 2021-09-19 DIAGNOSIS — T63481A Toxic effect of venom of other arthropod, accidental (unintentional), initial encounter: Secondary | ICD-10-CM

## 2021-09-19 MED ORDER — DIPHENHYDRAMINE HCL 25 MG PO CAPS
25.0000 mg | ORAL_CAPSULE | Freq: Once | ORAL | Status: AC
  Administered 2021-09-19: 25 mg via ORAL
  Filled 2021-09-19: qty 1

## 2021-09-19 MED ORDER — FAMOTIDINE 20 MG PO TABS: 20.0000 mg | ORAL_TABLET | Freq: Two times a day (BID) | ORAL | 0 refills | Status: DC

## 2021-09-19 MED ORDER — PREDNISONE 50 MG PO TABS
60.0000 mg | ORAL_TABLET | Freq: Once | ORAL | Status: AC
  Administered 2021-09-19: 60 mg via ORAL
  Filled 2021-09-19: qty 1

## 2021-09-19 MED ORDER — FAMOTIDINE 20 MG PO TABS
20.0000 mg | ORAL_TABLET | Freq: Once | ORAL | Status: AC
  Administered 2021-09-19: 20 mg via ORAL
  Filled 2021-09-19: qty 1

## 2021-09-19 MED ORDER — PREDNISONE 10 MG PO TABS
ORAL_TABLET | ORAL | 0 refills | Status: DC
Start: 2021-09-19 — End: 2021-12-19

## 2021-09-19 MED ORDER — DIPHENHYDRAMINE HCL 25 MG PO TABS: 25.0000 mg | ORAL_TABLET | Freq: Four times a day (QID) | ORAL | 0 refills | Status: DC

## 2021-09-19 NOTE — Discharge Instructions (Signed)
You are being treated for hives which I suspect are induced by your multiple insect stings which occurred last week.  I do recommend you continue using your Benadryl and I have prescribed you additional Benadryl, I would like you to take 25 mg every 6 hours for the least the next 3 to 4 days after which you can cut back if your symptoms have resolved.  You have been prescribed Pepcid which is also an antihistamine and will help resolve this reaction, take this medication until gone.  Take your next dose of prednisone tomorrow as you have received today's dose here.  Get rechecked for any persistent or worsening symptoms.  You have been screened for syphilis, if this is positive you will need to be given penicillin to treat this and will be notified if this test is positive.

## 2021-09-19 NOTE — ED Provider Notes (Signed)
[facility] Provider Note   CSN: [id number] Arrival date & time: 09/19/21  1618     History  Chief Complaint  Patient presents with   Urticaria    Carol Crawford is a 46 y.o. female with a history including emphysema, personal history of right breast cancer, bipolar disorder who reports having been fully treated for syphilis 1 year ago, presenting for evaluation of intermittent rash and facial swelling.  She was bit by multiple bees/yellow jackets 7 days ago and has had localized swelling, pain and itching at the bite sites.  For the past 4 nights she has been having migratory erythematous pruritic plaque-like rash, in addition has had lip, cheek and chin swelling nightly which has responded to doses of Benadryl.  She denies shortness of breath with these episodes, denies throat swelling or tightness, no wheezing or cough.  She also denies fevers or chills.  She presents with a photo on her cell phone showing significant facial edema from her upper lip through her chin which is not present on today's visit.  Patient states she discussed her symptoms with her primary provider, stating that some of her rash symptoms remind her of her symptoms prior to being treated for syphilis a year ago.  She does endorse being sexually active, PCP encouraged her to get screened for this possible infection.  Of note, her symptoms started after her insect stings.  She is not on blood pressure medications, specifically no ARB's or ACE inhibitors.  She also denies any new soaps, lotions, other personal hygiene products, no new medications or foods, including OTC medicines.  The history is provided by the patient.       Home Medications Prior to Admission medications   Medication Sig Start Date End Date Taking? Authorizing Provider  diphenhydrAMINE (BENADRYL) 25 MG tablet Take 1 tablet (25 mg total) by mouth every 6 (six) hours. 09/19/21  Yes Michio Thier, Almyra Free, PA-C  famotidine (PEPCID) 20 MG  tablet Take 1 tablet (20 mg total) by mouth 2 (two) times daily. 09/19/21  Yes Marsean Elkhatib, Almyra Free, PA-C  predniSONE (DELTASONE) 10 MG tablet 6, 5, 4, 3, 2 then 1 tablet by mouth daily for 6 days total. 09/19/21  Yes Attikus Bartoszek, Almyra Free, PA-C  acetaminophen (TYLENOL) 500 MG tablet Take 500 mg by mouth every 6 (six) hours as needed for moderate pain or mild pain.    [provider]  albuterol (VENTOLIN HFA) 108 (90 Base) MCG/ACT inhaler Inhale 2 puffs into the lungs every 4 (four) hours as needed for wheezing or shortness of breath (cough). 09/04/21   Karamalegos, Devonne Doughty, DO  Ascorbic Acid (VITAMIN C PO) Take by mouth.    [provider]  ibuprofen (ADVIL) 400 MG tablet Take 800 mg by mouth every 6 (six) hours as needed.    [provider]  nicotine (NICODERM CQ) 14 mg/24hr patch Place 1 patch (14 mg total) onto the skin daily. For 28 days Patient not taking: Reported on 09/06/2021 09/18/20   Olin Hauser, DO  QUEtiapine (SEROQUEL) 100 MG tablet Take 1 tablet (100 mg total) by mouth at bedtime. 09/06/21   Karamalegos, Alexander J, DO  SPIRIVA RESPIMAT 2.5 MCG/ACT AERS Inhale 2 puffs into the lungs daily. 09/04/21   Karamalegos, Devonne Doughty, DO  tamoxifen (NOLVADEX) 20 MG tablet Take 0.5 tablets (10 mg total) by mouth daily. Patient not taking: Reported on 09/06/2021 09/05/21   Nicholas Lose, MD  traZODone (DESYREL) 50 MG tablet Take 1 tablet (50 mg  total) by mouth at bedtime. 09/06/21   Karamalegos, Devonne Doughty, DO      Allergies    Shrimp [shellfish allergy], Bee venom, Wasp venom, and Tomato    Review of Systems   Review of Systems  Constitutional:  Negative for chills and fever.  HENT:  Positive for facial swelling. Negative for congestion, sore throat, trouble swallowing and voice change.   Eyes: Negative.   Respiratory:  Negative for chest tightness, shortness of breath and wheezing.   Cardiovascular:  Negative for chest pain.  Gastrointestinal:  Negative for abdominal  pain and nausea.  Genitourinary: Negative.  Negative for pelvic pain and vaginal discharge.  Musculoskeletal:  Negative for arthralgias, joint swelling and neck pain.  Skin:  Positive for rash. Negative for wound.  Neurological:  Negative for dizziness, weakness, light-headedness, numbness and headaches.  Psychiatric/Behavioral: Negative.      Physical Exam Updated Vital Signs BP 119/73 (BP Location: Left Arm)   Pulse 70   Temp 98 F (36.7 C) (Oral)   Resp 17   LMP 08/17/2021 (Approximate)   SpO2 100%  Physical Exam Constitutional:      General: She is not in acute distress.    Appearance: She is well-developed.  HENT:     Head: Normocephalic.     Mouth/Throat:     Mouth: Mucous membranes are moist.     Pharynx: Oropharynx is clear. No oropharyngeal exudate or posterior oropharyngeal erythema.  Cardiovascular:     Rate and Rhythm: Normal rate.  Pulmonary:     Effort: Pulmonary effort is normal.     Breath sounds: No stridor. No wheezing.  Musculoskeletal:        General: Normal range of motion.     Cervical back: Neck supple.  Skin:    Findings: No erythema or rash.     Comments: No urticaria is currently present.  Phone photos confirm urticaria however.     ED Results / Procedures / Treatments   Labs (all labs ordered are listed, but only abnormal results are displayed) Labs Reviewed  RPR  HIV ANTIBODY (ROUTINE TESTING W REFLEX)    EKG None  Radiology No results found.  Procedures Procedures    Medications Ordered in ED Medications  predniSONE (DELTASONE) tablet 60 mg (has no administration in time range)  diphenhydrAMINE (BENADRYL) capsule 25 mg (has no administration in time range)  famotidine (PEPCID) tablet 20 mg (has no administration in time range)    ED Course/ Medical Decision Making/ A&P                           Medical Decision Making Patient with waxing and waning urticaria-like symptoms including lower facial edema occurring nightly  after having multiple bees/yellowjacket stings occurring 7 days ago.  She has been taking Benadryl which she states has been helpful but does not completely eliminate these intermittent episodes.  I strongly feel her current symptoms are directly related to the stings and have no suggestion of venous syphilis presentation.  However given her prior history we will screen her for syphilis today, and RPR has been ordered.  Patient will be treated with steroids, Benadryl and Pepcid.  Plan follow-up with her PCP for further evaluation if symptoms persist, return here for any worsening symptoms.  Amount and/or Complexity of Data Reviewed Labs: ordered.           Final Clinical Impression(s) / ED Diagnoses Final diagnoses:  Urticaria  Insect stings, accidental  or unintentional, initial encounter    Rx / DC Orders ED Discharge Orders          Ordered    famotidine (PEPCID) 20 MG tablet  2 times daily        09/19/21 1905    predniSONE (DELTASONE) 10 MG tablet        09/19/21 1905    diphenhydrAMINE (BENADRYL) 25 MG tablet  Every 6 hours        09/19/21 1905              Landis Martins 09/19/21 Joesph Fillers, MD 09/20/21 1705

## 2021-09-19 NOTE — ED Triage Notes (Signed)
Pt presents with Urticaria for last 3 days, to face trunk, no new soaps, lotions or detergent, has taken benadryl today.

## 2021-09-20 ENCOUNTER — Telehealth: Payer: Self-pay | Admitting: Licensed Clinical Social Worker

## 2021-09-20 LAB — HIV ANTIBODY (ROUTINE TESTING W REFLEX): HIV Screen 4th Generation wRfx: NONREACTIVE

## 2021-09-20 LAB — RPR
RPR Ser Ql: REACTIVE — AB
RPR Titer: 1:1 {titer}

## 2021-09-20 NOTE — Telephone Encounter (Signed)
    Clinical Social Work  Care Management   Phone Outreach    09/20/2021 Name: NORELLE RUNNION MRN: [id number] DOB: Oct 18, 1975  [name] is a 46 y.o. year old female who is a primary care patient of Olin Hauser, DO .   Reason for referral: Mental Health Counseling and Resources.    CCM LCSW reached out to patient today by phone to introduce self, assess needs and offer Care Management services and interventions.    2nd unsuccessful telephone outreach attempt.  If unable to reach patient by phone on the 3rd attempt, will discontinue outreach calls but will be available at any time to provide services.   Plan:CCM LCSW will wait for return call. If no return call is received, Will route chart to Care Guide to see if patient would like to reschedule phone appointment   Review of patient status, including review of consultants reports, relevant laboratory and other test results, and collaboration with appropriate care team members and the patient's provider was performed as part of comprehensive patient evaluation and provision of care management services.    Christa See, MSW, [place] [facility] Care Management [email or web address] Phone [phone] 10:56 AM

## 2021-09-21 DIAGNOSIS — F17210 Nicotine dependence, cigarettes, uncomplicated: Secondary | ICD-10-CM

## 2021-09-21 DIAGNOSIS — J432 Centrilobular emphysema: Secondary | ICD-10-CM | POA: Diagnosis not present

## 2021-09-21 DIAGNOSIS — F32A Depression, unspecified: Secondary | ICD-10-CM | POA: Diagnosis not present

## 2021-09-21 LAB — T.PALLIDUM AB, TOTAL: T Pallidum Abs: REACTIVE — AB

## 2021-09-28 ENCOUNTER — Telehealth: Payer: Self-pay

## 2021-09-28 NOTE — Telephone Encounter (Signed)
   Mail encounter was:  Successful.  09/28/2021 Name: Carol Crawford MRN: [id number] DOB: Feb 21, 1976  Carol Crawford is a 46 y.o. year old female who is a primary care patient of Olin Hauser, DO . The community resource team was consulted for assistance with Food Insecurity and Financial Difficulties related to bills  Care guide performed the following interventions:  Sending requested resources by mail per patient's request. Address was confirmed .  Mail enclosed: Food resources (such as [employer], [employer], Theatre manager, Designer, television/film set, and [employer]) Pensions consultant ([place], Solicitor, Clinical biochemist and rent relief) and [employer].  Follow Up Plan:  Care guide will follow up with patient by phone over the next few days to confirm mail has been received.  [place] management  [place], [place]  Main Phone: [phone]  E-mail: [email or web address]  Website: [email or web address]

## 2021-10-02 ENCOUNTER — Telehealth: Payer: Self-pay

## 2021-10-02 ENCOUNTER — Telehealth: Payer: Medicare HMO

## 2021-10-05 ENCOUNTER — Telehealth: Payer: Self-pay | Admitting: Licensed Clinical Social Worker

## 2021-10-05 NOTE — Telephone Encounter (Signed)
    Clinical Social Work  Care Management   Phone Outreach    10/05/2021 Name: Carol Crawford MRN: [id number] DOB: [birth year]  Carol Crawford is a 46 y.o. year old female who is a primary care patient of Olin Hauser, DO .   Reason for referral: Mental Health Counseling and Resources.    2nd unsuccessful telephone outreach attempt.  If unable to reach patient by phone on the 3rd attempt, will discontinue outreach calls but will be available at any time to provide services.   Plan:CCM LCSW will wait for return call. If no return call is received, Will route chart to Care Guide to see if patient would like to reschedule phone appointment   Review of patient status, including review of consultants reports, relevant laboratory and other test results, and collaboration with appropriate care team members and the patient's provider was performed as part of comprehensive patient evaluation and provision of care management services.     Christa See, MSW, [place] Care Management [email or web address] Phone [phone] 4:41 PM

## 2021-10-11 ENCOUNTER — Telehealth: Payer: Self-pay

## 2021-10-11 NOTE — Telephone Encounter (Signed)
   Telephone encounter was:  Unsuccessful.  10/11/2021 Name: Carol Crawford MRN: [id number] DOB: [birth year]  Unsuccessful outbound call made today to assist with:  Food Insecurity and Financial Difficulties related to bills  Outreach Attempt:  1st Attempt f/u  A HIPAA compliant voice message was left requesting a return call.  Instructed patient to call back at [phone] at their earliest convenience.  [place] management  [place], [name]  Main Phone: [phone]  E-mail: [name]  Website: [email or web address]

## 2021-10-12 ENCOUNTER — Telehealth: Payer: Self-pay

## 2021-10-12 NOTE — Telephone Encounter (Signed)
   Telephone encounter was:  Successful.  10/12/2021 Name: ANJALEE COPE MRN: [id number] DOB: 01-Oct-1975  [name] is a 46 y.o. year old female who is a primary care patient of Olin Hauser, DO . The community resource team was consulted for assistance with [place] guide performed the following interventions: Follow up call placed to the patient to discuss status of referral. Resources received and two more new ones for food pantries sent by e-mail.  Follow Up Plan:  No further follow up planned at this time. The patient has been provided with needed resources.  [place] management  [place], [place]  Main Phone: [phone]  E-mail: [email or web address]  Website: [email or web address]

## 2021-10-22 NOTE — Progress Notes (Signed)
Patient Care Team: Olin Hauser, DO as PCP - General (Family Medicine) Rico Junker, RN as Registered Nurse [place], Alyssa Grove, RN (Inactive) as Registered Nurse Jovita Kussmaul, MD as Consulting Physician (General Surgery) Nicholas Lose, MD as Consulting Physician (Hematology and Oncology) Kyung Rudd, MD as Consulting Physician (Radiation Oncology) Rebekah [place]field, LCSW as Social Worker (Licensed Clinical Social Worker) Vanita Ingles, RN as Registered Nurse ([place])  DIAGNOSIS:  Encounter Diagnosis  Name Primary?   Malignant neoplasm of upper-outer quadrant of right breast in female, estrogen receptor positive ([place])     SUMMARY OF ONCOLOGIC HISTORY: Oncology History  Malignant neoplasm of upper-outer quadrant of right breast in female, estrogen receptor positive ([place])  10/21/2018 Initial Diagnosis   16-monthfollow-up of breast calcifications showed calcifications increased in size, spanning 1.5cm, with no discrete mass. Biopsy on 10/21/18 showed invasive mammary carcinoma, grade 1, HER-2 negative (0), ER+ 51-90%, [facility]90[facility]   11/10/2018 Cancer Staging   Staging form: Breast, AJCC 8th Edition - Clinical stage from 11/10/2018: Stage IA (cT1c, cN0, cM0, G1, ER+, PR+, HER2-)   11/25/2018 Genetic Testing   Negative genetic testing on a STAT panel.  The STAT Breast cancer panel offered by [facility] includes sequencing and rearrangement analysis for the following 9 genes:  ATM, BRCA1, BRCA2, CDH1, CHEK2, PALB2, PTEN, STK11 and TP53.   The report date is 11/18/2018.    SMARCA4 c.925C>A VUS identified on the multicancer panel.  The Multi-Gene Panel offered by [facility] includes sequencing and/or deletion duplication testing of the following 85 genes: AIP, ALK, APC, ATM, AXIN2,BAP1,  BARD1, BLM, BMPR1A, BRCA1, BRCA2, BRIP1, CASR, CDC73, CDH1, CDK4, CDKN1B, CDKN1C, CDKN2A (p14ARF), CDKN2A (p16INK4a), CEBPA, CHEK2, CTNNA1, DICER1, DIS3L2, EGFR (c.2369C>T, p.Thr790Met  variant only), EPCAM (Deletion/duplication testing only), FH, FLCN, GATA2, GPC3, GREM1 (Promoter region deletion/duplication testing only), HOXB13 (c.251G>A, p.Gly84Glu), HRAS, KIT, MAX, MEN1, MET, MITF (c.952G>A, p.Glu318Lys variant only), MLH1, MSH2, MSH3, MSH6, MUTYH, NBN, NF1, NF2, NTHL1, PALB2, PDGFRA, PHOX2B, PMS2, POLD1, POLE, POT1, PRKAR1A, PTCH1, PTEN, RAD50, RAD51C, RAD51D, RB1, RECQL4, RET, RNF43, RUNX1, SDHAF2, SDHA (sequence changes only), SDHB, SDHC, SDHD, SMAD4, SMARCA4, SMARCB1, SMARCE1, STK11, SUFU, TERC, TERT, TMEM127, TP53, TSC1, TSC2, VHL, WRN and WT1.  The report date is November 25, 2018.   12/02/2018 Surgery   Right lumpectomy ([name] ([id number]: IDC with DCIS, grade 1, 1.4cm, 4 axillary lymph nodes negative   12/15/2018 Oncotype testing   Oncotype: score of 20, 6% of distant recurrence in 9 years with tamoxifen alone   01/28/2019 - 03/31/2019 Radiation Therapy   The patient initially received a dose of 50.4 Gy in 28 fractions to the breast using whole-breast tangent fields. This was delivered using a 3-D conformal technique. The patient then received a boost to the seroma. This delivered an additional 10 Gy in 5 fractions using a 3-field photon boost technique. The total dose was 60.4 Gy.   03/2019 - 03/2024 Anti-estrogen oral therapy   Tamoxifen     CHIEF COMPLIANT: Follow-up on Tamoxifen  INTERVAL HISTORY: Carol NEYENSis a 46y.o with the above mention. She presents to the clinic today for a follow-up. She complains of mild hot flashes.  She is tolerating tamoxifen reasonably well since we reduced the dosage.  She denies any major pain or discomfort other than the scar tissue tenderness which is intermittent in nature.   ALLERGIES:  is allergic to shrimp [shellfish allergy], bee venom, wasp venom, and tomato.  MEDICATIONS:  Current Outpatient  Medications  Medication Sig Dispense Refill   diclofenac Sodium (VOLTAREN) 1 % GEL Apply 1 Application topically 2 (two) times  daily. 50 g 1   acetaminophen (TYLENOL) 500 MG tablet Take 500 mg by mouth every 6 (six) hours as needed for moderate pain or mild pain.     albuterol (VENTOLIN HFA) 108 (90 Base) MCG/ACT inhaler Inhale 2 puffs into the lungs every 4 (four) hours as needed for wheezing or shortness of breath (cough). 1 each 2   Ascorbic Acid (VITAMIN C PO) Take by mouth.     diphenhydrAMINE (BENADRYL) 25 MG tablet Take 1 tablet (25 mg total) by mouth every 6 (six) hours. 20 tablet 0   famotidine (PEPCID) 20 MG tablet Take 1 tablet (20 mg total) by mouth 2 (two) times daily. 14 tablet 0   ibuprofen (ADVIL) 400 MG tablet Take 800 mg by mouth every 6 (six) hours as needed.     nicotine (NICODERM CQ) 14 mg/24hr patch Place 1 patch (14 mg total) onto the skin daily. For 28 days (Patient not taking: Reported on 09/06/2021) 28 patch 0   predniSONE (DELTASONE) 10 MG tablet 6, 5, 4, 3, 2 then 1 tablet by mouth daily for 6 days total. 21 tablet 0   QUEtiapine (SEROQUEL) 100 MG tablet Take 1 tablet (100 mg total) by mouth at bedtime. 90 tablet 3   SPIRIVA RESPIMAT 2.5 MCG/ACT AERS Inhale 2 puffs into the lungs daily. 4 g 2   tamoxifen (NOLVADEX) 20 MG tablet Take 0.5 tablets (10 mg total) by mouth daily. (Patient not taking: Reported on 09/06/2021) 90 tablet 3   traZODone (DESYREL) 50 MG tablet Take 1 tablet (50 mg total) by mouth at bedtime. 90 tablet 3   No current facility-administered medications for this visit.    PHYSICAL EXAMINATION: ECOG PERFORMANCE STATUS: 1 - Symptomatic but completely ambulatory  Vitals:   10/31/21 1105  BP: 115/81  Pulse: 64  Resp: 18  Temp: (!) 97.5 F (36.4 C)  SpO2: 100%   Filed Weights   10/31/21 1105  Weight: 161 lb 1.6 oz (73.1 kg)    BREAST: No palpable masses or nodules in either right or left breasts.  Tenderness in the right axillary scar tissue.  No palpable axillary supraclavicular or infraclavicular adenopathy no breast tenderness or nipple discharge. (exam performed in  the presence of a chaperone)  LABORATORY DATA:  I have reviewed the data as listed    Latest Ref Rng & Units 07/27/2021    4:28 PM 09/30/2020    7:13 PM 08/31/2020    7:14 AM  CMP  Glucose 70 - 99 mg/dL 80  110  91   BUN 6 - 20 mg/dL 8  6  10    Creatinine 0.44 - 1.00 mg/dL 0.71  0.92  0.80   Sodium 135 - 145 mmol/L 140  138  135   Potassium 3.5 - 5.1 mmol/L 3.0  3.2  3.8   Chloride 98 - 111 mmol/L 109  110  106   CO2 22 - 32 mmol/L 24  25  23    Calcium 8.9 - 10.3 mg/dL 8.5  8.6  8.6   Total Protein 6.5 - 8.1 g/dL 6.8     Total Bilirubin 0.3 - 1.2 mg/dL 0.7     Alkaline Phos 38 - 126 U/L 71     AST 15 - 41 U/L 26     ALT 0 - 44 U/L 17       Lab Results  Component Value Date   WBC 5.6 07/27/2021   HGB 12.4 07/27/2021   HCT 36.0 07/27/2021   MCV 94.7 07/27/2021   PLT 239 07/27/2021   NEUTROABS 3.1 07/27/2021    ASSESSMENT & PLAN:  Malignant neoplasm of upper-outer quadrant of right breast in female, estrogen receptor positive ([place]) 12/02/2018:Right lumpectomy [name]): IDC with DCIS, grade 1, 1.4cm, 4 axillary lymph nodes negative HER-2 negative (0), ER+ 51-90%, PR+ >90% ([facility]) Oncotype: score of 20, 6% of distant recurrence in 9 years with tamoxifen alone (did not recommend chemotherapy) Adj XRT 01/29/19   Current treatment: Adjuvant antiestrogen therapy with tamoxifen 20 mg daily to be started in 2 weeks.  Plan is for 10 years   Tamoxifen toxicities:  Mild hot flashes: These hot flashes have gotten much better since we reduced the dosage.  Scar tissue tenderness in the right axilla: Instructed her to take Voltaren gel    Breast cancer surveillance: Mammogram 05/04/2021: Benign breast density category C Breast exam 10/31/2021: Benign I provided her with instructions on how to make lemon peel essential oil that can help with the pain as well.   Return to clinic in 1 year for follow-up    No orders of the defined types were placed in this encounter.  The patient has  a good understanding of the overall plan. she agrees with it. she will call with any problems that may develop before the next visit here. Total time spent: 30 mins including face to face time and time spent for planning, charting and co-ordination of care   Harriette Ohara, MD 10/31/21    I Gardiner Coins am scribing for Dr. Lindi Adie  I have reviewed the above documentation for accuracy and completeness, and I agree with the above.

## 2021-10-30 NOTE — Assessment & Plan Note (Signed)
12/02/2018:Right lumpectomy [name]): IDC with DCIS, grade 1, 1.4cm, 4 axillary lymph nodes negative HER-2 negative (0), ER+ 51-90%, PR+ >90% (done [facility]) Oncotype: score of 20, 6% of distant recurrence in 9 years with tamoxifen alone(did not recommend chemotherapy) Adj XRT 01/29/19  Current treatment: Adjuvant antiestrogen therapy with tamoxifen 20 mg daily to be started in 2 weeks.Plan is for 10 years  Tamoxifentoxicities: Severe hot flashes: I reduce the dosage of tamoxifen to 10 mg daily.   Breast cancer surveillance: Mammogram 05/04/2021: Benign breast density category C Breast exam 10/31/2020: Benign   Return to clinic in1 year for follow-up

## 2021-10-31 ENCOUNTER — Other Ambulatory Visit: Payer: Self-pay

## 2021-10-31 ENCOUNTER — Inpatient Hospital Stay: Payer: Medicare Other | Attending: Hematology and Oncology | Admitting: Hematology and Oncology

## 2021-10-31 ENCOUNTER — Inpatient Hospital Stay: Payer: Medicare Other

## 2021-10-31 DIAGNOSIS — C50411 Malignant neoplasm of upper-outer quadrant of right female breast: Secondary | ICD-10-CM | POA: Diagnosis not present

## 2021-10-31 DIAGNOSIS — Z170 Estrogen receptor positive status [ER+]: Secondary | ICD-10-CM | POA: Insufficient documentation

## 2021-10-31 DIAGNOSIS — Z79810 Long term (current) use of selective estrogen receptor modulators (SERMs): Secondary | ICD-10-CM | POA: Diagnosis not present

## 2021-10-31 MED ORDER — DICLOFENAC SODIUM 1 % EX GEL: 1.0000 | Freq: Two times a day (BID) | CUTANEOUS | 1 refills | Status: DC

## 2021-11-05 ENCOUNTER — Telehealth: Payer: Medicare HMO

## 2021-11-05 ENCOUNTER — Telehealth: Payer: Self-pay

## 2021-11-05 ENCOUNTER — Ambulatory Visit (INDEPENDENT_AMBULATORY_CARE_PROVIDER_SITE_OTHER): Payer: Medicare Other

## 2021-11-05 DIAGNOSIS — F331 Major depressive disorder, recurrent, moderate: Secondary | ICD-10-CM

## 2021-11-05 DIAGNOSIS — F209 Schizophrenia, unspecified: Secondary | ICD-10-CM

## 2021-11-05 DIAGNOSIS — F319 Bipolar disorder, unspecified: Secondary | ICD-10-CM

## 2021-11-05 DIAGNOSIS — J432 Centrilobular emphysema: Secondary | ICD-10-CM

## 2021-11-05 DIAGNOSIS — F419 Anxiety disorder, unspecified: Secondary | ICD-10-CM
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
## 2021-11-05 NOTE — Telephone Encounter (Signed)
   Telephone encounter was:  Unsuccessful.  11/05/2021 Name: ROYELLE HINCHMAN MRN: [id number] DOB: Dec 27, 1975  Unsuccessful outbound call made today to assist with:   ENT PROVIDER  Outreach Attempt:  1st Attempt  A HIPAA compliant voice message was left requesting a return call.  Instructed patient to call back at earliest convenience.  [place], Care Management  [phone] [address], [place], [place] [address] Phone: [phone] Email: [email or web address]
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
## 2021-11-22 DIAGNOSIS — J432 Centrilobular emphysema: Secondary | ICD-10-CM | POA: Diagnosis not present

## 2021-11-22 DIAGNOSIS — F32A Depression, unspecified: Secondary | ICD-10-CM

## 2021-12-10 ENCOUNTER — Ambulatory Visit (INDEPENDENT_AMBULATORY_CARE_PROVIDER_SITE_OTHER): Payer: Medicare Other

## 2021-12-10 DIAGNOSIS — F331 Major depressive disorder, recurrent, moderate: Secondary | ICD-10-CM

## 2021-12-10 DIAGNOSIS — F319 Bipolar disorder, unspecified: Secondary | ICD-10-CM

## 2021-12-10 DIAGNOSIS — F419 Anxiety disorder, unspecified: Secondary | ICD-10-CM

## 2021-12-10 DIAGNOSIS — J432 Centrilobular emphysema: Secondary | ICD-10-CM
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
## 2021-12-19 ENCOUNTER — Other Ambulatory Visit: Payer: Self-pay | Admitting: Family Medicine

## 2021-12-22 DIAGNOSIS — F32A Depression, unspecified: Secondary | ICD-10-CM

## 2021-12-22 DIAGNOSIS — J432 Centrilobular emphysema: Secondary | ICD-10-CM | POA: Diagnosis not present

## 2021-12-22 DIAGNOSIS — F17210 Nicotine dependence, cigarettes, uncomplicated: Secondary | ICD-10-CM

## 2022-01-08 ENCOUNTER — Encounter (HOSPITAL_COMMUNITY): Payer: Self-pay

## 2022-01-08 ENCOUNTER — Other Ambulatory Visit: Payer: Self-pay

## 2022-01-08 ENCOUNTER — Emergency Department (HOSPITAL_COMMUNITY)
Admission: EM | Admit: 2022-01-08 | Discharge: 2022-01-08 | Discharge status: Against Medical Advice | Payer: Medicare Other | Attending: Emergency Medicine | Admitting: Emergency Medicine

## 2022-01-08 DIAGNOSIS — R2243 Localized swelling, mass and lump, lower limb, bilateral: Secondary | ICD-10-CM | POA: Diagnosis not present

## 2022-01-08 DIAGNOSIS — R21 Rash and other nonspecific skin eruption: Secondary | ICD-10-CM | POA: Diagnosis not present

## 2022-01-08 DIAGNOSIS — Z5321 Procedure and treatment not carried out due to patient leaving prior to being seen by health care provider: Secondary | ICD-10-CM | POA: Diagnosis not present

## 2022-01-08 DIAGNOSIS — R519 Headache, unspecified: Secondary | ICD-10-CM | POA: Diagnosis not present

## 2022-01-08 LAB — COMPREHENSIVE METABOLIC PANEL
ALT: 14 U/L (ref 0–44)
AST: 18 U/L (ref 15–41)
Albumin: 4.1 g/dL (ref 3.5–5.0)
Alkaline Phosphatase: 55 U/L (ref 38–126)
Anion gap: 7 (ref 5–15)
BUN: 9 mg/dL (ref 6–20)
CO2: 24 mmol/L (ref 22–32)
Calcium: 9.6 mg/dL (ref 8.9–10.3)
Chloride: 110 mmol/L (ref 98–111)
Creatinine, Ser: 0.92 mg/dL (ref 0.44–1.00)
GFR, Estimated: >60 mL/min (ref >60)
Glucose, Bld: 103 mg/dL — ABNORMAL HIGH (ref 70–99)
Potassium: 3.6 mmol/L (ref 3.5–5.1)
Sodium: 141 mmol/L (ref 135–145)
Total Bilirubin: 0.6 mg/dL (ref 0.3–1.2)
Total Protein: 6.7 g/dL (ref 6.5–8.1)

## 2022-01-08 LAB — CBC
HCT: 38.6 % (ref 36.0–46.0)
Hemoglobin: 13.3 g/dL (ref 12.0–15.0)
MCH: 32.9 pg (ref 26.0–34.0)
MCHC: 34.5 g/dL (ref 30.0–36.0)
MCV: 95.5 fL (ref 80.0–100.0)
Platelets: 248 10*3/uL (ref 150–400)
RBC: 4.04 MIL/uL (ref 3.87–5.11)
RDW: 12.5 % (ref 11.5–15.5)
WBC: 5.2 10*3/uL (ref 4.0–10.5)
nRBC: 0 % (ref 0.0–0.2)

## 2022-01-08 LAB — I-STAT BETA HCG BLOOD, ED (MC, WL, AP ONLY): I-stat hCG, quantitative: <5 m[IU]/mL (ref <5)

## 2022-01-08 LAB — LIPASE, BLOOD: Lipase: 30 U/L (ref 11–51)

## 2022-01-08 NOTE — ED Triage Notes (Signed)
Pt with multiple complaints during triage. States that she "walked into a corner of a wall accidentally" approximately one month ago causing swelling above her L eye. Pt states swelling has not went down since then and she has had headaches.   Also, pt states she was recently treated for syphillis and has had abd since receiving abx. Intermittent nausea, but no vomiting or diarrhea.   Pt states that she has had 11 foot surgeries on her L foot and when it gets cold her toes raise and she is unable to make them flat.

## 2022-01-08 NOTE — ED Provider Triage Note (Signed)
Emergency Medicine Provider Triage Evaluation Note  Carol Crawford , a 46 y.o. female  was evaluated in triage.  Pt complains of multiple complaints. Complaining of headache after head injury one month ago, foot pain after recent surgery, and abdominal pain after syphilis treatment. Still having rash in groin area with associated itching. Also complaining of bilateral foot swelling making it difficult to work. Concerned her "syphilis numbers are back up"  Review of Systems  Positive: As above Negative: CP, SOB, blurry vision  Physical Exam  BP 121/86 (BP Location: Left Arm)   Pulse 74   Temp 98.5 F (36.9 C) (Oral)   Resp 16   SpO2 99%  Gen:   Awake, no distress   Resp:  Normal effort  MSK:   Moves extremities without difficulty  Other:    Medical Decision Making  Medically screening exam initiated at 3:48 PM.  Appropriate orders placed.  Carol Crawford was informed that the remainder of the evaluation will be completed by another provider, this initial triage assessment does not replace that evaluation, and the importance of remaining in the ED until their evaluation is complete.  Workup initiated   Carol Latorre T, PA-C 01/08/22 1550

## 2022-01-08 NOTE — ED Notes (Signed)
Patient very upset over wait time. Patient left

## 2022-01-14 ENCOUNTER — Telehealth: Payer: Self-pay

## 2022-01-14 ENCOUNTER — Telehealth: Payer: Medicare Other

## 2022-01-14 NOTE — Telephone Encounter (Signed)
  Care Management   Follow Up Note   01/14/2022 Name: Carol Crawford MRN: [id number] DOB: 11-Apr-1975   Referred by: Olin Hauser, DO Reason for referral : Care Coordination (RNCM: Follow up for Chronic Disease Management and Care Coordination Needs Attempt/Case Closure)   An unsuccessful telephone outreach was attempted today. The patient was referred to the case management team for assistance with care management and care coordination.   Follow Up Plan: No further follow up required: the patient does not meet criteria for CCM program. Attempts to reach the patient have not been successful. Closing the care plan.  Noreene Larsson RN, MSN, CCM RN Care Manager  Chronic Care Management Direct Number: [phone]

## 2022-01-16 ENCOUNTER — Telehealth: Payer: Self-pay

## 2022-01-16 NOTE — Telephone Encounter (Signed)
Copied from [place] [id number]. Topic: Medical Record Request - Other >> Jan 16, 2022  3:50 PM Chapman Fitch wrote: Reason for CRM: Pt called and her house management needs medical records or assessment to know pts diagnoses and what she takes medication for / his number to be reached is [phone] Stallion) / the pt would like the office to give him a call/ she stated he has tried several attempts to get this information with no success    - I attempted to call her.. VM is full.   The patient would need to call the medical records dept to have her records sent to her housing manager.   She will also need a f/u appt with Dr. Raliegh Ip to get updated health information because the last time she was seen was in June of this year.

## 2022-04-30 ENCOUNTER — Other Ambulatory Visit: Payer: Self-pay | Admitting: Adult Health

## 2022-04-30 DIAGNOSIS — Z1231 Encounter for screening mammogram for malignant neoplasm of breast: Secondary | ICD-10-CM

## 2022-05-20 ENCOUNTER — Telehealth: Payer: Self-pay | Admitting: Family Medicine

## 2022-05-20 NOTE — Telephone Encounter (Signed)
Pt is calling to request if she can get a letter to state what she is dx with. Letter to be addressed to [facility] caseworker Frankey Poot [phone]

## 2022-05-20 NOTE — Telephone Encounter (Signed)
Carol Crawford called to give fax number for the records he needs for the pt/ They need a face sheet or diagnoses for pt for housing / please advise and fax to Fax # [phone]

## 2022-05-27 ENCOUNTER — Encounter: Payer: Self-pay | Admitting: Family Medicine

## 2022-05-27 DIAGNOSIS — F419 Anxiety disorder, unspecified: Secondary | ICD-10-CM | POA: Insufficient documentation

## 2022-05-27 NOTE — Telephone Encounter (Signed)
Can you please contact patient to let her know that the Letter is written with her diagnosis list? It looks like last time she called we were unable to reach her. I was not aware of this request until today  I wrote the letter and signed it. It includes her current updated diagnosis list.  There is a fax # on this conversion to submit it.  Fax [place], [facility] 05/27/2022, 12:34 PM

## 2022-05-27 NOTE — Telephone Encounter (Signed)
The patient has called to follow up on the status of their paperwork   The patient would like to be contacted when their diagnosis letter is submitted   Please contact further when possible

## 2022-05-27 NOTE — Telephone Encounter (Signed)
Pt is requesting for this letter to be uploaded to her mychart so she can share it with the program provider.

## 2022-05-28 NOTE — Telephone Encounter (Signed)
The letter should be available to her on MyChart. I just sent her a message letting her know how to get to it.  We are not permitted to send and receive through email.

## 2022-06-03 ENCOUNTER — Other Ambulatory Visit: Payer: Self-pay | Admitting: Family Medicine

## 2022-06-03 DIAGNOSIS — F331 Major depressive disorder, recurrent, moderate: Secondary | ICD-10-CM

## 2022-06-03 NOTE — Telephone Encounter (Signed)
Medication Refill - Medication: traZODone (DESYREL) 50 MG table/traZODone (DESYREL) 50 MG table  Has the patient contacted their pharmacy? yes (Agent: If no, request that the patient contact the pharmacy for the refill. If patient does not wish to contact the pharmacy document the reason why and proceed with request.) (Agent: If yes, when and what did the pharmacy advise?)pharmacy called in directly     Preferred Pharmacy (with phone number or street name):  [place], [place] - [id number] Phone: [phone]  Fax: [phone]     Has the patient been seen for an appointment in the last year OR does the patient have an upcoming appointment? yes  Agent: Please be advised that RX refills may take up to 3 business days. We ask that you follow-up with your pharmacy.

## 2022-06-04 MED ORDER — TRAZODONE HCL 50 MG PO TABS: 50.0000 mg | ORAL_TABLET | Freq: Every day | ORAL | 0 refills | Status: DC

## 2022-06-04 NOTE — Telephone Encounter (Signed)
Attempted to call pt to schedule her physical.   Got a recording "This call can not be completed at this time, try again later"  Forwarded the Trazodone request to Dr. Parks Ranger.

## 2022-06-17 ENCOUNTER — Ambulatory Visit: Payer: 59

## 2022-06-27 ENCOUNTER — Other Ambulatory Visit: Payer: Self-pay | Admitting: Family Medicine

## 2022-06-27 DIAGNOSIS — J432 Centrilobular emphysema: Secondary | ICD-10-CM

## 2022-06-27 DIAGNOSIS — F331 Major depressive disorder, recurrent, moderate: Secondary | ICD-10-CM

## 2022-06-27 MED ORDER — SPIRIVA RESPIMAT 2.5 MCG/ACT IN AERS: 2.0000 | INHALATION_SPRAY | Freq: Every day | RESPIRATORY_TRACT | 0 refills | Status: DC

## 2022-06-27 NOTE — Telephone Encounter (Signed)
Requested Prescriptions  Pending Prescriptions Disp Refills   SPIRIVA RESPIMAT 2.5 MCG/ACT AERS 4 g 0    Sig: Inhale 2 puffs into the lungs daily.     Pulmonology:  Anticholinergic Agents Passed - 06/27/2022  2:09 PM      Passed - Valid encounter within last 12 months    Recent Outpatient Visits           9 months ago Major depressive disorder, recurrent, moderate ([place])   [facility] [place], Devonne Doughty, DO   1 year ago Major depressive disorder, recurrent, moderate [facility])   [facility] [place], Devonne Doughty, DO               traZODone (DESYREL) 50 MG tablet 90 tablet 0    Sig: Take 1 tablet (50 mg total) by mouth at bedtime.     Psychiatry: Antidepressants - Serotonin Modulator Failed - 06/27/2022  2:09 PM      Failed - Valid encounter within last 6 months    Recent Outpatient Visits           9 months ago Major depressive disorder, recurrent, moderate ([place])   [place], DO   1 year ago Major depressive disorder, recurrent, moderate [facility])   [place], DO              Passed - Completed PHQ-2 or PHQ-9 in the last 360 days

## 2022-06-27 NOTE — Telephone Encounter (Signed)
Medication Refill - Medication: SPIRIVA RESPIMAT 2.5 MCG/ACT AERS  traZODone (DESYREL) 50 MG tablet   And wants all other meds transferred to this new pharmacy below   Has the patient contacted their pharmacy? Yes.   (Agent: If no, request that the patient contact the pharmacy for the refill. If patient does not wish to contact the pharmacy document the reason why and proceed with request.) (Agent: If yes, when and what did the pharmacy advise?)  Preferred Pharmacy (with phone number or street name):  [place], [place]  [place] [address]  Phone: [phone] Fax: [phone]   Has the patient been seen for an appointment in the last year OR does the patient have an upcoming appointment? Yes.    Agent: Please be advised that RX refills may take up to 3 business days. We ask that you follow-up with your pharmacy.

## 2022-07-25 ENCOUNTER — Ambulatory Visit: Payer: 59

## 2022-07-26 ENCOUNTER — Ambulatory Visit
Admission: RE | Admit: 2022-07-26 | Discharge: 2022-07-26 | Disposition: A | Discharge status: Home / Self Care | Payer: 59 | Source: Ambulatory Visit | Attending: Adult Health | Admitting: Adult Health

## 2022-07-26 DIAGNOSIS — Z1231 Encounter for screening mammogram for malignant neoplasm of breast: Secondary | ICD-10-CM

## 2022-09-17 ENCOUNTER — Inpatient Hospital Stay: Payer: 59 | Attending: Physician Assistant | Admitting: Physician Assistant

## 2022-09-17 VITALS — BP 116/65 | HR 65 | Temp 98.7°F | Resp 16

## 2022-09-17 DIAGNOSIS — Z801 Family history of malignant neoplasm of trachea, bronchus and lung: Secondary | ICD-10-CM | POA: Insufficient documentation

## 2022-09-17 DIAGNOSIS — Z800 Family history of malignant neoplasm of digestive organs: Secondary | ICD-10-CM | POA: Insufficient documentation

## 2022-09-17 DIAGNOSIS — Z803 Family history of malignant neoplasm of breast: Secondary | ICD-10-CM | POA: Insufficient documentation

## 2022-09-17 DIAGNOSIS — Z170 Estrogen receptor positive status [ER+]: Secondary | ICD-10-CM | POA: Insufficient documentation

## 2022-09-17 DIAGNOSIS — Z8042 Family history of malignant neoplasm of prostate: Secondary | ICD-10-CM | POA: Insufficient documentation

## 2022-09-17 DIAGNOSIS — Z8049 Family history of malignant neoplasm of other genital organs: Secondary | ICD-10-CM | POA: Diagnosis not present

## 2022-09-17 DIAGNOSIS — Z79899 Other long term (current) drug therapy: Secondary | ICD-10-CM | POA: Diagnosis not present

## 2022-09-17 DIAGNOSIS — F17210 Nicotine dependence, cigarettes, uncomplicated: Secondary | ICD-10-CM | POA: Insufficient documentation

## 2022-09-17 DIAGNOSIS — Z923 Personal history of irradiation: Secondary | ICD-10-CM | POA: Diagnosis not present

## 2022-09-17 DIAGNOSIS — Z79810 Long term (current) use of selective estrogen receptor modulators (SERMs): Secondary | ICD-10-CM | POA: Diagnosis not present

## 2022-09-17 DIAGNOSIS — M79601 Pain in right arm: Secondary | ICD-10-CM | POA: Diagnosis not present

## 2022-09-17 DIAGNOSIS — C50411 Malignant neoplasm of upper-outer quadrant of right female breast: Secondary | ICD-10-CM | POA: Diagnosis not present

## 2022-09-17 MED ORDER — HYDROCODONE-ACETAMINOPHEN 5-325 MG PO TABS: 1.0000 | ORAL_TABLET | Freq: Four times a day (QID) | ORAL | 0 refills | Status: AC | PRN

## 2022-09-17 MED ORDER — CYCLOBENZAPRINE HCL 5 MG PO TABS: 5.0000 mg | ORAL_TABLET | Freq: Three times a day (TID) | ORAL | 0 refills | Status: DC | PRN

## 2022-09-17 NOTE — Progress Notes (Signed)
Symptom Management Consult Note [facility]    Patient Care Team: Smitty Cords, DO as PCP - General (Family Medicine) Jim Like, RN as Registered Nurse Scarlett Presto, RN (Inactive) as Registered Nurse Griselda Miner, MD as Consulting Physician (General Surgery) Serena Croissant, MD as Consulting Physician (Hematology and Oncology) Dorothy Puffer, MD as Consulting Physician (Radiation Oncology) Bridgett Larsson, LCSW as Social Worker (Licensed Clinical Social Worker)    Name / MRN / DOBRaynee Crawford  [id number]  Mar 07, 1976   Date of visit: 09/17/2022   Chief Complaint/Reason for visit: right arm pain   Current Therapy: tamoxifen   ASSESSMENT & PLAN: Patient is a 47 y.o. female  with oncologic history of  malignant neoplasm of upper-outer quadrant of right breast, estrogen receptor positive followed by Dr. Pamelia Hoit.  I have viewed most recent oncology note and lab work.    #Malignant neoplasm of upper-outer quadrant of right breast in female, estrogen receptor positive - Next appointment with oncologist is 11/04/22   #Right arm pain -Patient previously followed by lymphedema clinic, last visit in July 2021. -On exam today patient has no swelling to right upper extremity, compartments are soft and extremity is neurovascularly intact. -Per HPI pain is similar to what she experienced before starting physical therapy. -Referral sent to lymphedema clinic to get patient in to reestablish care for further evaluation. -Patient given short course of flexeril and norco to help with pain. I have reviewed the PDMP during this encounter.      Heme/Onc History: Oncology History  Malignant neoplasm of upper-outer quadrant of right breast in female, estrogen receptor positive (HCC)  10/21/2018 Initial Diagnosis   87-month follow-up of breast calcifications showed calcifications increased in size, spanning 1.5cm, with no discrete mass. Biopsy on 10/21/18  showed invasive mammary carcinoma, grade 1, HER-2 negative (0), ER+ 51-90%, PR+ >90% (done at [facility])   11/10/2018 Cancer Staging   Staging form: Breast, AJCC 8th Edition - Clinical stage from 11/10/2018: Stage IA (cT1c, cN0, cM0, G1, ER+, PR+, HER2-)   11/25/2018 Genetic Testing   Negative genetic testing on a STAT panel.  The STAT Breast cancer panel offered by [facility] includes sequencing and rearrangement analysis for the following 9 genes:  ATM, BRCA1, BRCA2, CDH1, CHEK2, PALB2, PTEN, STK11 and TP53.   The report date is 11/18/2018.    SMARCA4 c.925C>A VUS identified on the multicancer panel.  The Multi-Gene Panel offered by [facility] includes sequencing and/or deletion duplication testing of the following 85 genes: AIP, ALK, APC, ATM, AXIN2,BAP1,  BARD1, BLM, BMPR1A, BRCA1, BRCA2, BRIP1, CASR, CDC73, CDH1, CDK4, CDKN1B, CDKN1C, CDKN2A (p14ARF), CDKN2A (p16INK4a), CEBPA, CHEK2, CTNNA1, DICER1, DIS3L2, EGFR (c.2369C>T, p.Thr790Met variant only), EPCAM (Deletion/duplication testing only), FH, FLCN, GATA2, GPC3, GREM1 (Promoter region deletion/duplication testing only), HOXB13 (c.251G>A, p.Gly84Glu), HRAS, KIT, MAX, MEN1, MET, MITF (c.952G>A, p.Glu318Lys variant only), MLH1, MSH2, MSH3, MSH6, MUTYH, NBN, NF1, NF2, NTHL1, PALB2, PDGFRA, PHOX2B, PMS2, POLD1, POLE, POT1, PRKAR1A, PTCH1, PTEN, RAD50, RAD51C, RAD51D, RB1, RECQL4, RET, RNF43, RUNX1, SDHAF2, SDHA (sequence changes only), SDHB, SDHC, SDHD, SMAD4, SMARCA4, SMARCB1, SMARCE1, STK11, SUFU, TERC, TERT, TMEM127, TP53, TSC1, TSC2, VHL, WRN and WT1.  The report date is November 25, 2018.   12/02/2018 Surgery   Right lumpectomy Carol Crawford) [phone]): IDC with DCIS, grade 1, 1.4cm, 4 axillary lymph nodes negative   12/15/2018 Oncotype testing   Oncotype: score of 20, 6% of distant recurrence in 9 years with tamoxifen alone   01/28/2019 - 03/31/2019  Radiation Therapy   The patient initially received a dose of 50.4 Gy in 28 fractions to the breast using whole-breast  tangent fields. This was delivered using a 3-D conformal technique. The patient then received a boost to the seroma. This delivered an additional 10 Gy in 5 fractions using a 3-field photon boost technique. The total dose was 60.4 Gy.   03/2019 - 03/2024 Anti-estrogen oral therapy   Tamoxifen       Interval history-: Carol Crawford is a 47 y.o. female with oncologic history as above presenting to [facility] today with chief complaint of right arm pain. She presents unaccompanied to clinic.  Patient reports pain has been present x 1.5 months. She describes the pain as sharp and throbbing. Pain is located in her shoulder and radiates down her arm. Pain is constant and worse with movement. She has been taking ibuprofen for pain which provides minimal relief. Occasionally her arm will swell, denies any swelling recently. She also started having muscle spasms in her right upper arm. She states pain is worse at night. She reports her current pain is similar to prior to her stating physical therapy at the lymphedema clinic. She reports being given compression sleeves and a brace to wear at night that provided great pain relief. She unfortunately lost those items after moving approximately  6 months ago. She denies any recent injury to her arm.    ROS  All other systems are reviewed and are negative for acute change except as noted in the HPI.    Allergies  Allergen Reactions   Shrimp [Shellfish Allergy] Hives   Bee Venom Swelling   Wasp Venom Swelling   Tomato Rash     Past Medical History:  Diagnosis Date   ADHD    Bipolar 1 disorder (HCC)    Breast cancer (HCC)    currently in treatment for   Breast disorder    right breast cancer   COPD (chronic obstructive pulmonary disease) (HCC)    Depressed    Drug overdose 2010   Family history of breast cancer    Family history of colon cancer    Family history of prostate cancer    GERD (gastroesophageal reflux disease)    OCC   Headache     MIGRAINES (None for 2 months)   History of MRSA infection    foot infection   Personal history of radiation therapy 01/28/2019   right breast   Schizophrenia (HCC)    Stroke (HCC)    after foot surgery, left side was numb, fully recovered     Past Surgical History:  Procedure Laterality Date   BREAST BIOPSY Right 10/21/2018   Affirm Bx-"X" clip-path pending   BREAST LUMPECTOMY Right 12/02/2018   malignant   BREAST LUMPECTOMY WITH RADIOACTIVE SEED AND SENTINEL LYMPH NODE BIOPSY Right 12/02/2018   Procedure: RIGHT BREAST LUMPECTOMY WITH RADIOACTIVE SEED AND SENTINEL LYMPH NODE BIOPSY;  Surgeon: Griselda Miner, MD;  Location: [facility];  Service: General;  Laterality: Right;   INCISION AND DRAINAGE Left 01/09/2018   Procedure: INCISION AND DRAINAGE-below fascia foot; multiple;  Surgeon: Gwyneth Revels, DPM;  Location: [facility];  Service: Podiatry;  Laterality: Left;   INCISION AND DRAINAGE Left 04/17/2018   Procedure: I&D; BELOW FASCIA FOOT; MULTIPLE;  Surgeon: Gwyneth Revels, DPM;  Location: [facility];  Service: Podiatry;  Laterality: Left;   INCISION AND DRAINAGE Right 12/18/2018   Procedure: PLACEMENT OF DRAIN IN RIGHT AXILLA;  Surgeon: Chevis Pretty III,  MD;  Location: [facility];  Service: General;  Laterality: Right;   INCISION AND DRAINAGE Left 04/12/2020   Procedure: INCISION AND DRAINAGE BELOW FASCIA FOOT, SINGLE LEFT;  Surgeon: Gwyneth Revels, DPM;  Location: [facility];  Service: Podiatry;  Laterality: Left;  leave first for transportation   REPAIR EXTENSOR TENDON Left 09/05/2017   Procedure: REPAIR EXTENSOR TENDON;  Surgeon: Gwyneth Revels, DPM;  Location: [facility];  Service: Podiatry;  Laterality: Left;    Social History   Socioeconomic History   Marital status: Single    Spouse name: Not on file   Number of children: 2   Years of education: Not on file   Highest education level: Not on file  Occupational History   Not on file  Tobacco Use   Smoking  status: Every Day    Packs/day: 0.25    Years: 30.00    Additional pack years: 0.00    Total pack years: 7.50    Types: Cigarettes, E-cigarettes   Smokeless tobacco: Never   Tobacco comments:    6 cigs daily  Vaping Use   Vaping Use: Former   Start date: 08/22/2017  Substance and Sexual Activity   Alcohol use: Yes    Alcohol/week: 2.0 standard drinks of alcohol    Types: 2 Cans of beer per week   Drug use: Yes    Types: Marijuana    Comment: last use 01/10/21 2x/wk   Sexual activity: Yes    Partners: Male  Other Topics Concern   Not on file  Social History Narrative   Not on file   Social Determinants of Health   Financial Resource Strain: High Risk (09/06/2021)   Overall Financial Resource Strain (CARDIA)    Difficulty of Paying Living Expenses: Hard  Food Insecurity: Food Insecurity Present (09/06/2021)   Hunger Vital Sign    Worried About Running Out of Food in the Last Year: Sometimes true    Ran Out of Food in the Last Year: Sometimes true  Transportation Needs: Unmet Transportation Needs (10/31/2021)   PRAPARE - Administrator, Civil Service (Medical): Yes    Lack of Transportation (Non-Medical): Yes  Physical Activity: Insufficiently Active (09/06/2021)   Exercise Vital Sign    Days of Exercise per Week: 7 days    Minutes of Exercise per Session: 20 min  Stress: Stress Concern Present (09/06/2021)   [employer] of Occupational Health - Occupational Stress Questionnaire    Feeling of Stress : Very much  Social Connections: Unknown (09/06/2021)   Social Connection and Isolation Panel [NHANES]    Frequency of Communication with Friends and Family: More than three times a week    Frequency of Social Gatherings with Friends and Family: More than three times a week    Attends Religious Services: 1 to 4 times per year    Active Member of [employer] or Organizations: No    Attends Banker Meetings: 1 to 4 times per year    Marital Status: Not on  file  Intimate Partner Violence: Not At Risk (09/17/2019)   Humiliation, Afraid, Rape, and Kick questionnaire    Fear of Current or Ex-Partner: No    Emotionally Abused: No    Physically Abused: No    Sexually Abused: No    Family History  Problem Relation Age of Onset   Arthritis Mother    Hypertension Mother    Cervical cancer Mother    Prostate cancer Father    Lung cancer Father  Diabetes Paternal Aunt    Breast cancer Paternal Aunt    ADD / ADHD Daughter    Bipolar disorder Daughter    Hypertension Maternal Grandmother    Stroke Maternal Grandmother    Prostate cancer Maternal Grandfather    Hypertension Maternal Grandfather    Colon cancer Maternal Grandfather    Hypertension Paternal Grandfather    Stroke Paternal Grandfather    Breast cancer Cousin      Current Outpatient Medications:    cyclobenzaprine (FLEXERIL) 5 MG tablet, Take 1 tablet (5 mg total) by mouth 3 (three) times daily as needed for muscle spasms., Disp: 30 tablet, Rfl: 0   HYDROcodone-acetaminophen (NORCO) 5-325 MG tablet, Take 1 tablet by mouth every 6 (six) hours as needed for up to 5 days for severe pain., Disp: 20 tablet, Rfl: 0   acetaminophen (TYLENOL) 500 MG tablet, Take 500 mg by mouth every 6 (six) hours as needed for moderate pain or mild pain., Disp: , Rfl:    albuterol (VENTOLIN HFA) 108 (90 Base) MCG/ACT inhaler, Inhale 2 puffs into the lungs every 4 (four) hours as needed for wheezing or shortness of breath (cough)., Disp: 1 each, Rfl: 2   Ascorbic Acid (VITAMIN C PO), Take by mouth., Disp: , Rfl:    diclofenac Sodium (VOLTAREN) 1 % GEL, Apply 1 Application topically 2 (two) times daily., Disp: 50 g, Rfl: 1   ibuprofen (ADVIL) 400 MG tablet, Take 800 mg by mouth every 6 (six) hours as needed., Disp: , Rfl:    nicotine (NICODERM CQ) 14 mg/24hr patch, Place 1 patch (14 mg total) onto the skin daily. For 28 days (Patient not taking: Reported on 09/06/2021), Disp: 28 patch, Rfl: 0   QUEtiapine  (SEROQUEL) 100 MG tablet, Take 1 tablet (100 mg total) by mouth at bedtime., Disp: 90 tablet, Rfl: 3   SPIRIVA RESPIMAT 2.5 MCG/ACT AERS, Inhale 2 puffs into the lungs daily., Disp: 4 g, Rfl: 0   tamoxifen (NOLVADEX) 20 MG tablet, Take 0.5 tablets (10 mg total) by mouth daily. (Patient not taking: Reported on 09/06/2021), Disp: 90 tablet, Rfl: 3   traZODone (DESYREL) 50 MG tablet, Take 1 tablet (50 mg total) by mouth at bedtime., Disp: 90 tablet, Rfl: 0  PHYSICAL EXAM: ECOG FS:1 - Symptomatic but completely ambulatory    Vitals:   09/17/22 1015  BP: 116/65  Pulse: 65  Resp: 16  Temp: 98.7 F (37.1 C)  TempSrc: Oral  SpO2: 100%   Physical Exam Vitals reviewed.  Constitutional:      Appearance: She is not ill-appearing or toxic-appearing.  HENT:     Head: Normocephalic.  Eyes:     General: No scleral icterus. Neck:     Comments: No midline cervical tenderness. No step off or deformity. No paraspinal tenderness. Cardiovascular:     Rate and Rhythm: Normal rate.     Pulses: Normal pulses.          Radial pulses are 2+ on the right side and 2+ on the left side.  Pulmonary:     Effort: Pulmonary effort is normal.  Abdominal:     General: There is no distension.  Musculoskeletal:     Cervical back: Normal range of motion.     Comments: No obvious deformity noted to RUE. Decreased ROM of right shoulder secondary to pain. Full passive ROM. No bony tenderness in RUE. Compartments soft in RUE. No swelling or skin changes.  Skin:    General: Skin is warm.  Capillary Refill: Capillary refill takes less than 2 seconds.     Findings: No rash.  Neurological:     Mental Status: She is alert.     Comments: Sensation intact to RUE. Equal grip strength in bilateral UE        LABORATORY DATA: I have reviewed the data as listed    Latest Ref Rng & Units 01/08/2022    4:04 PM 07/27/2021    4:28 PM 09/30/2020    7:13 PM  CBC  WBC 4.0 - 10.5 K/uL 5.2  5.6  5.0   Hemoglobin 12.0 -  15.0 g/dL 16.1  09.6  04.5   Hematocrit 36.0 - 46.0 % 38.6  36.0  37.5   Platelets 150 - 400 K/uL 248  239  265         Latest Ref Rng & Units 01/08/2022    4:04 PM 07/27/2021    4:28 PM 09/30/2020    7:13 PM  CMP  Glucose 70 - 99 mg/dL 409  80  811   BUN 6 - 20 mg/dL 9  8  6    Creatinine 0.44 - 1.00 mg/dL 9.14  7.82  9.56   Sodium 135 - 145 mmol/L 141  140  138   Potassium 3.5 - 5.1 mmol/L 3.6  3.0  3.2   Chloride 98 - 111 mmol/L 110  109  110   CO2 22 - 32 mmol/L 24  24  25    Calcium 8.9 - 10.3 mg/dL 9.6  8.5  8.6   Total Protein 6.5 - 8.1 g/dL 6.7  6.8    Total Bilirubin 0.3 - 1.2 mg/dL 0.6  0.7    Alkaline Phos 38 - 126 U/L 55  71    AST 15 - 41 U/L 18  26    ALT 0 - 44 U/L 14  17         RADIOGRAPHIC STUDIES (from last 24 hours if applicable) I have personally reviewed the radiological images as listed and agreed with the findings in the report. No results found.      Visit Diagnosis: 1. Malignant neoplasm of upper-outer quadrant of right breast in female, estrogen receptor positive (HCC)   2. Pain of right upper extremity      Orders Placed This Encounter  Procedures   Ambulatory referral to Physical Therapy    Referral Priority:   Urgent    Referral Type:   Physical Medicine    Referral Reason:   Specialty Services Required    Requested Specialty:   Physical Therapy    Number of Visits Requested:   1    All questions were answered. The patient knows to call the clinic with any problems, questions or concerns. No barriers to learning was detected.  A total of more than 30 minutes were spent on this encounter with face-to-face time and non-face-to-face time, including preparing to see the patient, ordering tests and/or medications, counseling the patient and coordination of care as outlined above.    Thank you for allowing me to participate in the care of this patient.    Shanon Ace, PA-C Department of Hematology/Oncology [facility] at [facility] Phone: [phone]  [phone]    09/17/2022 9:20 PM

## 2022-09-30 ENCOUNTER — Encounter: Payer: Self-pay | Admitting: Physical Therapy

## 2022-09-30 ENCOUNTER — Other Ambulatory Visit: Payer: Self-pay

## 2022-09-30 ENCOUNTER — Ambulatory Visit: Payer: 59 | Attending: Hematology and Oncology | Admitting: Physical Therapy

## 2022-09-30 DIAGNOSIS — M25611 Stiffness of right shoulder, not elsewhere classified: Secondary | ICD-10-CM | POA: Insufficient documentation

## 2022-09-30 DIAGNOSIS — Z170 Estrogen receptor positive status [ER+]: Secondary | ICD-10-CM | POA: Diagnosis not present

## 2022-09-30 DIAGNOSIS — M79601 Pain in right arm: Secondary | ICD-10-CM | POA: Diagnosis not present

## 2022-09-30 DIAGNOSIS — I890 Lymphedema, not elsewhere classified: Secondary | ICD-10-CM | POA: Diagnosis not present

## 2022-09-30 DIAGNOSIS — C50411 Malignant neoplasm of upper-outer quadrant of right female breast: Secondary | ICD-10-CM | POA: Insufficient documentation

## 2022-09-30 DIAGNOSIS — M25511 Pain in right shoulder: Secondary | ICD-10-CM | POA: Insufficient documentation
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
## 2022-10-03 ENCOUNTER — Ambulatory Visit: Payer: 59 | Admitting: Physical Therapy

## 2022-10-03 ENCOUNTER — Encounter: Payer: Self-pay | Admitting: Physical Therapy

## 2022-10-03 DIAGNOSIS — M25611 Stiffness of right shoulder, not elsewhere classified: Secondary | ICD-10-CM

## 2022-10-03 DIAGNOSIS — Z170 Estrogen receptor positive status [ER+]: Secondary | ICD-10-CM | POA: Diagnosis not present

## 2022-10-03 DIAGNOSIS — M25511 Pain in right shoulder: Secondary | ICD-10-CM

## 2022-10-03 DIAGNOSIS — M79601 Pain in right arm: Secondary | ICD-10-CM

## 2022-10-03 DIAGNOSIS — I890 Lymphedema, not elsewhere classified: Secondary | ICD-10-CM

## 2022-10-03 DIAGNOSIS — C50411 Malignant neoplasm of upper-outer quadrant of right female breast: Secondary | ICD-10-CM | POA: Diagnosis not present
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
## 2022-10-07 ENCOUNTER — Encounter: Payer: Self-pay | Admitting: Physical Therapy

## 2022-10-07 ENCOUNTER — Ambulatory Visit: Payer: 59 | Admitting: Physical Therapy

## 2022-10-07 DIAGNOSIS — M25611 Stiffness of right shoulder, not elsewhere classified: Secondary | ICD-10-CM | POA: Diagnosis not present

## 2022-10-07 DIAGNOSIS — I890 Lymphedema, not elsewhere classified: Secondary | ICD-10-CM | POA: Diagnosis not present

## 2022-10-07 DIAGNOSIS — Z170 Estrogen receptor positive status [ER+]: Secondary | ICD-10-CM | POA: Diagnosis not present

## 2022-10-07 DIAGNOSIS — M25511 Pain in right shoulder: Secondary | ICD-10-CM

## 2022-10-07 DIAGNOSIS — M79601 Pain in right arm: Secondary | ICD-10-CM | POA: Diagnosis not present

## 2022-10-07 DIAGNOSIS — C50411 Malignant neoplasm of upper-outer quadrant of right female breast: Secondary | ICD-10-CM | POA: Diagnosis not present
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
## 2022-10-15 ENCOUNTER — Ambulatory Visit: Payer: 59 | Admitting: Rehabilitation

## 2022-10-17 ENCOUNTER — Ambulatory Visit: Payer: 59 | Admitting: Rehabilitation

## 2022-10-17 ENCOUNTER — Encounter: Payer: Self-pay | Admitting: Rehabilitation

## 2022-10-21 ENCOUNTER — Ambulatory Visit: Payer: 59 | Admitting: Physical Therapy

## 2022-10-23 ENCOUNTER — Encounter: Payer: 59 | Admitting: Physical Therapy

## 2022-10-28 ENCOUNTER — Ambulatory Visit: Payer: 59 | Admitting: Rehabilitation

## 2022-10-29 NOTE — Progress Notes (Signed)
Patient Care Team: Smitty Cords, DO as PCP - General (Family Medicine) Jim Like, RN as Registered Nurse Collene Mares, Harlow Asa, RN (Inactive) as Registered Nurse Griselda Miner, MD as Consulting Physician (General Surgery) Serena Croissant, MD as Consulting Physician (Hematology and Oncology) Dorothy Puffer, MD as Consulting Physician (Radiation Oncology) Bridgett Larsson, LCSW as Social Worker (Licensed Clinical Social Worker)  DIAGNOSIS:  Encounter Diagnosis  Name Primary?   Malignant neoplasm of upper-outer quadrant of right breast in female, estrogen receptor positive (HCC) Yes    SUMMARY OF ONCOLOGIC HISTORY: Oncology History  Malignant neoplasm of upper-outer quadrant of right breast in female, estrogen receptor positive (HCC)  10/21/2018 Initial Diagnosis   33-month follow-up of breast calcifications showed calcifications increased in size, spanning 1.5cm, with no discrete mass. Biopsy on 10/21/18 showed invasive mammary carcinoma, grade 1, HER-2 negative (0), ER+ 51-90%, PR+ >90% (done at [facility])   11/10/2018 Cancer Staging   Staging form: Breast, AJCC 8th Edition - Clinical stage from 11/10/2018: Stage IA (cT1c, cN0, cM0, G1, ER+, PR+, HER2-)   11/25/2018 Genetic Testing   Negative genetic testing on a STAT panel.  The STAT Breast cancer panel offered by [facility] includes sequencing and rearrangement analysis for the following 9 genes:  ATM, BRCA1, BRCA2, CDH1, CHEK2, PALB2, PTEN, STK11 and TP53.   The report date is 11/18/2018.    SMARCA4 c.925C>A VUS identified on the multicancer panel.  The Multi-Gene Panel offered by [facility] includes sequencing and/or deletion duplication testing of the following 85 genes: AIP, ALK, APC, ATM, AXIN2,BAP1,  BARD1, BLM, BMPR1A, BRCA1, BRCA2, BRIP1, CASR, CDC73, CDH1, CDK4, CDKN1B, CDKN1C, CDKN2A (p14ARF), CDKN2A (p16INK4a), CEBPA, CHEK2, CTNNA1, DICER1, DIS3L2, EGFR (c.2369C>T, p.Thr790Met variant only), EPCAM (Deletion/duplication testing  only), FH, FLCN, GATA2, GPC3, GREM1 (Promoter region deletion/duplication testing only), HOXB13 (c.251G>A, p.Gly84Glu), HRAS, KIT, MAX, MEN1, MET, MITF (c.952G>A, p.Glu318Lys variant only), MLH1, MSH2, MSH3, MSH6, MUTYH, NBN, NF1, NF2, NTHL1, PALB2, PDGFRA, PHOX2B, PMS2, POLD1, POLE, POT1, PRKAR1A, PTCH1, PTEN, RAD50, RAD51C, RAD51D, RB1, RECQL4, RET, RNF43, RUNX1, SDHAF2, SDHA (sequence changes only), SDHB, SDHC, SDHD, SMAD4, SMARCA4, SMARCB1, SMARCE1, STK11, SUFU, TERC, TERT, TMEM127, TP53, TSC1, TSC2, VHL, WRN and WT1.  The report date is November 25, 2018.   12/02/2018 Surgery   Right lumpectomy Carol Crawford) [phone]): IDC with DCIS, grade 1, 1.4cm, 4 axillary lymph nodes negative   12/15/2018 Oncotype testing   Oncotype: score of 20, 6% of distant recurrence in 9 years with tamoxifen alone   01/28/2019 - 03/31/2019 Radiation Therapy   The patient initially received a dose of 50.4 Gy in 28 fractions to the breast using whole-breast tangent fields. This was delivered using a 3-D conformal technique. The patient then received a boost to the seroma. This delivered an additional 10 Gy in 5 fractions using a 3-field photon boost technique. The total dose was 60.4 Gy.   03/2019 - 03/2024 Anti-estrogen oral therapy   Tamoxifen     CHIEF COMPLIANT: Follow-up tamoxifen  INTERVAL HISTORY: Carol Crawford is a 47 y.o with the above mention. She presents to the clinic today for a follow-up. Patient reports that she is still having right axilla pain. She states that it is chronic pain. On a scale of 1-10 she says it is a 9.5. She says it is constant everyday. Very tense in upper right shoulder. She is tolerating the tamoxifen extremely well.   ALLERGIES:  is allergic to shrimp [shellfish allergy], bee venom, wasp venom, and tomato.  MEDICATIONS:  Current Outpatient  Medications  Medication Sig Dispense Refill   gabapentin (NEURONTIN) 300 MG capsule Take 1 capsule (300 mg total) by mouth at bedtime. 90 capsule 3    acetaminophen (TYLENOL) 500 MG tablet Take 500 mg by mouth every 6 (six) hours as needed for moderate pain or mild pain.     albuterol (VENTOLIN HFA) 108 (90 Base) MCG/ACT inhaler Inhale 2 puffs into the lungs every 4 (four) hours as needed for wheezing or shortness of breath (cough). 1 each 2   Ascorbic Acid (VITAMIN C PO) Take by mouth.     cyclobenzaprine (FLEXERIL) 5 MG tablet Take 1 tablet (5 mg total) by mouth 3 (three) times daily as needed for muscle spasms. 60 tablet 0   diclofenac Sodium (VOLTAREN) 1 % GEL Apply 1 Application topically 2 (two) times daily. 50 g 1   ibuprofen (ADVIL) 400 MG tablet Take 800 mg by mouth every 6 (six) hours as needed.     nicotine (NICODERM CQ) 14 mg/24hr patch Place 1 patch (14 mg total) onto the skin daily. For 28 days (Patient not taking: Reported on 09/06/2021) 28 patch 0   QUEtiapine (SEROQUEL) 100 MG tablet Take 1 tablet (100 mg total) by mouth at bedtime. 90 tablet 3   SPIRIVA RESPIMAT 2.5 MCG/ACT AERS Inhale 2 puffs into the lungs daily. 4 g 0   tamoxifen (NOLVADEX) 20 MG tablet Take 0.5 tablets (10 mg total) by mouth daily. 90 tablet 3   traZODone (DESYREL) 50 MG tablet Take 1 tablet (50 mg total) by mouth at bedtime. 90 tablet 0   No current facility-administered medications for this visit.    PHYSICAL EXAMINATION: ECOG PERFORMANCE STATUS: 1 - Symptomatic but completely ambulatory  Vitals:   11/04/22 1046  BP: 131/74  Pulse: 75  Resp: 16  Temp: 97.9 F (36.6 C)  SpO2: 100%   Filed Weights   11/04/22 1046  Weight: 166 lb 11.2 oz (75.6 kg)    BREAST: No palpable masses or nodules in either right or left breasts.  Severe tenderness to even touch in the right axilla.  Could not do a thorough examination of the right breast.  No palpable axillary supraclavicular or infraclavicular adenopathy no breast tenderness or nipple discharge. (exam performed in the presence of a chaperone)  LABORATORY DATA:  I have reviewed the data as  listed    Latest Ref Rng & Units 01/08/2022    4:04 PM 07/27/2021    4:28 PM 09/30/2020    7:13 PM  CMP  Glucose 70 - 99 mg/dL 295  80  621   BUN 6 - 20 mg/dL 9  8  6    Creatinine 0.44 - 1.00 mg/dL 3.08  6.57  8.46   Sodium 135 - 145 mmol/L 141  140  138   Potassium 3.5 - 5.1 mmol/L 3.6  3.0  3.2   Chloride 98 - 111 mmol/L 110  109  110   CO2 22 - 32 mmol/L 24  24  25    Calcium 8.9 - 10.3 mg/dL 9.6  8.5  8.6   Total Protein 6.5 - 8.1 g/dL 6.7  6.8    Total Bilirubin 0.3 - 1.2 mg/dL 0.6  0.7    Alkaline Phos 38 - 126 U/L 55  71    AST 15 - 41 U/L 18  26    ALT 0 - 44 U/L 14  17      Lab Results  Component Value Date   WBC 5.2 01/08/2022  HGB 13.3 01/08/2022   HCT 38.6 01/08/2022   MCV 95.5 01/08/2022   PLT 248 01/08/2022   NEUTROABS 3.1 07/27/2021    ASSESSMENT & PLAN:  Malignant neoplasm of upper-outer quadrant of right breast in female, estrogen receptor positive (HCC) 12/02/2018:Right lumpectomy Carol Crawford): IDC with DCIS, grade 1, 1.4cm, 4 axillary lymph nodes negative HER-2 negative (0), ER+ 51-90%, PR+ >90% (done at [facility]) Oncotype: score of 20, 6% of distant recurrence in 9 years with tamoxifen alone (did not recommend chemotherapy) Adj XRT 01/29/19   Current treatment: Adjuvant antiestrogen therapy with tamoxifen 20 mg daily started December 2020.  Plan is for 10 years   Tamoxifen toxicities:  Mild hot flashes  Severe pain in the right breast and right shoulder: Related to prior surgery and radiation.  I sent a prescription for gabapentin 3 mg at bedtime.  I also renewed her Flexeril.   Breast cancer surveillance: Mammogram 07/30/2022: Benign breast density category C Breast exam 11/04/2022: Benign, difficult to palpate the right side because of the pain.   Return to clinic in 1 year for follow-up    No orders of the defined types were placed in this encounter.  The patient has a good understanding of the overall plan. she agrees with it. she will call with any problems  that may develop before the next visit here. Total time spent: 30 mins including face to face time and time spent for planning, charting and co-ordination of care   Tamsen Meek, MD 11/04/22    I Janan Ridge am acting as a Neurosurgeon for [employer]  I have reviewed the above documentation for accuracy and completeness, and I agree with the above.

## 2022-10-30 ENCOUNTER — Ambulatory Visit: Payer: 59 | Admitting: Physical Therapy

## 2022-11-04 ENCOUNTER — Other Ambulatory Visit: Payer: Self-pay | Admitting: Family Medicine

## 2022-11-04 ENCOUNTER — Inpatient Hospital Stay: Payer: 59 | Attending: Physician Assistant | Admitting: Hematology and Oncology

## 2022-11-04 ENCOUNTER — Inpatient Hospital Stay: Payer: 59

## 2022-11-04 ENCOUNTER — Other Ambulatory Visit: Payer: Self-pay

## 2022-11-04 VITALS — BP 131/74 | HR 75 | Temp 97.9°F | Resp 16 | Wt 166.7 lb

## 2022-11-04 DIAGNOSIS — Z79810 Long term (current) use of selective estrogen receptor modulators (SERMs): Secondary | ICD-10-CM | POA: Diagnosis not present

## 2022-11-04 DIAGNOSIS — C50411 Malignant neoplasm of upper-outer quadrant of right female breast: Secondary | ICD-10-CM | POA: Diagnosis not present

## 2022-11-04 DIAGNOSIS — Z170 Estrogen receptor positive status [ER+]: Secondary | ICD-10-CM | POA: Diagnosis not present

## 2022-11-04 DIAGNOSIS — Z79899 Other long term (current) drug therapy: Secondary | ICD-10-CM | POA: Insufficient documentation

## 2022-11-04 DIAGNOSIS — M79621 Pain in right upper arm: Secondary | ICD-10-CM | POA: Diagnosis not present

## 2022-11-04 DIAGNOSIS — J432 Centrilobular emphysema: Secondary | ICD-10-CM

## 2022-11-04 MED ORDER — TAMOXIFEN CITRATE 20 MG PO TABS: 10.0000 mg | ORAL_TABLET | Freq: Every day | ORAL | 3 refills | Status: DC

## 2022-11-04 MED ORDER — CYCLOBENZAPRINE HCL 5 MG PO TABS: 5.0000 mg | ORAL_TABLET | Freq: Three times a day (TID) | ORAL | 0 refills | Status: AC | PRN

## 2022-11-04 MED ORDER — GABAPENTIN 300 MG PO CAPS: 300.0000 mg | ORAL_CAPSULE | Freq: Every day | ORAL | 3 refills | Status: DC

## 2022-11-04 NOTE — Assessment & Plan Note (Addendum)
12/02/2018:Right lumpectomy Carol Crawford): IDC with DCIS, grade 1, 1.4cm, 4 axillary lymph nodes negative HER-2 negative (0), ER+ 51-90%, PR+ >90% (done at [facility]) Oncotype: score of 20, 6% of distant recurrence in 9 years with tamoxifen alone (did not recommend chemotherapy) Adj XRT 01/29/19   Current treatment: Adjuvant antiestrogen therapy with tamoxifen 20 mg daily started December 2020.  Plan is for 10 years   Tamoxifen toxicities:  Mild hot flashes  Severe pain in the right breast and right shoulder: Related to prior surgery and radiation.  I sent a prescription for gabapentin 3 mg at bedtime.  I also renewed her Flexeril.   Breast cancer surveillance: Mammogram 07/30/2022: Benign breast density category C Breast exam 11/04/2022: Benign, difficult to palpate the right side because of the pain.   Return to clinic in 1 year for follow-up

## 2022-11-11 ENCOUNTER — Telehealth: Payer: 59 | Admitting: Family Medicine

## 2022-11-11 ENCOUNTER — Encounter: Payer: Self-pay | Admitting: Family Medicine

## 2022-11-11 VITALS — Ht 67.0 in | Wt 166.7 lb

## 2022-11-11 DIAGNOSIS — F331 Major depressive disorder, recurrent, moderate: Secondary | ICD-10-CM

## 2022-11-11 DIAGNOSIS — F209 Schizophrenia, unspecified: Secondary | ICD-10-CM

## 2022-11-11 DIAGNOSIS — G8929 Other chronic pain: Secondary | ICD-10-CM

## 2022-11-11 DIAGNOSIS — M79672 Pain in left foot: Secondary | ICD-10-CM | POA: Diagnosis not present

## 2022-11-11 DIAGNOSIS — F419 Anxiety disorder, unspecified: Secondary | ICD-10-CM

## 2022-11-11 DIAGNOSIS — F319 Bipolar disorder, unspecified: Secondary | ICD-10-CM

## 2022-11-11 DIAGNOSIS — J432 Centrilobular emphysema: Secondary | ICD-10-CM

## 2022-11-11 MED ORDER — SPIRIVA RESPIMAT 2.5 MCG/ACT IN AERS: 2.0000 | INHALATION_SPRAY | Freq: Every day | RESPIRATORY_TRACT | 3 refills | Status: DC

## 2022-11-11 MED ORDER — BUSPIRONE HCL 5 MG PO TABS
5.0000 mg | ORAL_TABLET | Freq: Three times a day (TID) | ORAL | 2 refills | Status: AC | PRN
Start: 2022-11-11 — End: ?

## 2022-11-11 MED ORDER — TRAZODONE HCL 50 MG PO TABS
50.0000 mg | ORAL_TABLET | Freq: Every day | ORAL | 3 refills | Status: AC
Start: 2022-11-11 — End: ?

## 2022-11-11 MED ORDER — QUETIAPINE FUMARATE 200 MG PO TABS
200.0000 mg | ORAL_TABLET | Freq: Every day | ORAL | 3 refills | Status: AC
Start: 2022-11-11 — End: ?

## 2022-11-11 MED ORDER — ALBUTEROL SULFATE HFA 108 (90 BASE) MCG/ACT IN AERS: 2.0000 | INHALATION_SPRAY | RESPIRATORY_TRACT | 3 refills | Status: DC | PRN

## 2022-11-11 NOTE — Progress Notes (Signed)
Subjective:    Patient ID: Carol Crawford, female    DOB: Feb 15, 1976, 47 y.o.   MRN: [id number]  Carol Crawford is a 47 y.o. female presenting on 11/11/2022 for Depression  Virtual / Telehealth Encounter - Video Visit via MyChart The purpose of this virtual visit is to provide medical care while limiting exposure to the novel coronavirus (COVID19) for both patient and office staff.  Consent was obtained for remote visit:  Yes.   Answered questions that patient had about telehealth interaction:  Yes.   I discussed the limitations, risks, security and privacy concerns of performing an evaluation and management service by video/telephone. I also discussed with the patient that there may be a patient responsible charge related to this service. The patient expressed understanding and agreed to proceed.  Patient Location: Home Provider Location: [facility] (Office)  Participants in virtual visit: - Patient: Carol Crawford - CMA: Margaretha Sheffield CMA - Provider: Dr Althea Charon   HPI  Breast Cancer Stage 1 Followed by Oncology On medication Tamoxifen 20mg  daily.   Depression, Bipolar, Schizophrenia, Anxiety Following with Therapist New provider [facility] mental [facility], waited 1.5 month now has apt Mood worsening Request dose adjust. - She was on Trazodone 50mg , Seroquel 100mg , needs new orders.. - History of substance use in past. No longer using substances now. Rare alcohol intake only situational but no longer regularly Anxiety worsening asking about med   Centrilobular Emphysema (COPD) Tobacco Abuse Chronic smoker On Spiriva Albuterol AS NEEDED  Left Foot, chronic pain History of prior foot infection post op, and toe swelling and stiffness She has chronic foot pain.      11/11/2022    4:28 PM 09/06/2021   10:11 AM 09/18/2020   10:13 AM  Depression screen PHQ 2/9  Decreased Interest 3 1 2   Down, Depressed, Hopeless 3 3 2   PHQ - 2 Score  6 4 4   Altered sleeping 3 3 2   Tired, decreased energy 3 3 3   Change in appetite 2 3 2   Feeling bad or failure about yourself  0 0 1  Trouble concentrating 2 3 2   Moving slowly or fidgety/restless 0 0 1  Suicidal thoughts 0 0 1  PHQ-9 Score 16 16 16   Difficult doing work/chores Not difficult at all Very difficult Very difficult      11/11/2022    4:29 PM 09/06/2021   10:20 AM 09/18/2020   10:14 AM  GAD 7 : Generalized Anxiety Score  Nervous, Anxious, on Edge 3 3 2   Control/stop worrying 3 3 3   Worry too much - different things 3 3 3   Trouble relaxing 3 3 2   Restless 3 3 2   Easily annoyed or irritable 3 3 3   Afraid - awful might happen 0 1 2  Total GAD 7 Score 18 19 17   Anxiety Difficulty Very difficult Very difficult Very difficult      Social History   Tobacco Use   Smoking status: Every Day    Current packs/day: 0.25    Average packs/day: 0.3 packs/day for 30.0 years (7.5 ttl pk-yrs)    Types: Cigarettes, E-cigarettes   Smokeless tobacco: Never   Tobacco comments:    6 cigs daily  Vaping Use   Vaping status: Former   Start date: 08/22/2017  Substance Use Topics   Alcohol use: Yes    Alcohol/week: 2.0 standard drinks of alcohol    Types: 2 Cans of beer per week   Drug use:  Yes    Types: Marijuana    Comment: last use 01/10/21 2x/wk    Review of Systems Per HPI unless specifically indicated above     Objective:    Ht 5\' 7"  (1.702 m)   Wt 166 lb 11.2 oz (75.6 kg)   BMI 26.11 kg/m   Wt Readings from Last 3 Encounters:  11/11/22 166 lb 11.2 oz (75.6 kg)  11/04/22 166 lb 11.2 oz (75.6 kg)  10/31/21 161 lb 1.6 oz (73.1 kg)    Physical Exam  Note examination was completely remotely via video observation objective data only  Gen - well-appearing, no acute distress or apparent pain, comfortable HEENT - eyes appear clear without discharge or redness Heart/Lungs - cannot examine virtually - observed no evidence of coughing or labored breathing. Abd - cannot  examine virtually  Skin - face visible today- no rash Neuro - awake, alert, oriented Psych - mild anxious appearing, blunted affect, depressed   Results for orders placed or performed during the hospital encounter of 01/08/22  Lipase, blood  Result Value Ref Range   Lipase 30 11 - 51 U/L  Comprehensive metabolic panel  Result Value Ref Range   Sodium 141 135 - 145 mmol/L   Potassium 3.6 3.5 - 5.1 mmol/L   Chloride 110 98 - 111 mmol/L   CO2 24 22 - 32 mmol/L   Glucose, Bld 103 (H) 70 - 99 mg/dL   BUN 9 6 - 20 mg/dL   Creatinine, Ser 1.61 0.44 - 1.00 mg/dL   Calcium 9.6 8.9 - 09.6 mg/dL   Total Protein 6.7 6.5 - 8.1 g/dL   Albumin 4.1 3.5 - 5.0 g/dL   AST 18 15 - 41 U/L   ALT 14 0 - 44 U/L   Alkaline Phosphatase 55 38 - 126 U/L   Total Bilirubin 0.6 0.3 - 1.2 mg/dL   GFR, Estimated >04 >54 mL/min   Anion gap 7 5 - 15  CBC  Result Value Ref Range   WBC 5.2 4.0 - 10.5 K/uL   RBC 4.04 3.87 - 5.11 MIL/uL   Hemoglobin 13.3 12.0 - 15.0 g/dL   HCT 09.8 11.9 - 14.7 %   MCV 95.5 80.0 - 100.0 fL   MCH 32.9 26.0 - 34.0 pg   MCHC 34.5 30.0 - 36.0 g/dL   RDW 82.9 56.2 - 13.0 %   Platelets 248 150 - 400 K/uL   nRBC 0.0 0.0 - 0.2 %  I-Stat beta hCG blood, ED  Result Value Ref Range   I-stat hCG, quantitative <5.0 <5 mIU/mL   Comment 3              Assessment & Plan:   Problem List Items Addressed This Visit     Anxiety   Relevant Medications   traZODone (DESYREL) 50 MG tablet   busPIRone (BUSPAR) 5 MG tablet   Bipolar 1 disorder (HCC) with SA 2020 (OD pain pills)   Centrilobular emphysema (HCC)   Relevant Medications   albuterol (VENTOLIN HFA) 108 (90 Base) MCG/ACT inhaler   SPIRIVA RESPIMAT 2.5 MCG/ACT AERS   Major depressive disorder, recurrent, moderate (HCC) - Primary   Relevant Medications   QUEtiapine (SEROQUEL) 200 MG tablet   traZODone (DESYREL) 50 MG tablet   busPIRone (BUSPAR) 5 MG tablet   Schizophrenia (HCC)   Relevant Medications   QUEtiapine (SEROQUEL)  200 MG tablet   traZODone (DESYREL) 50 MG tablet   Other Visit Diagnoses     Chronic foot pain,  left       Relevant Medications   traZODone (DESYREL) 50 MG tablet   Other Relevant Orders   Ambulatory referral to Podiatry       Chronic mental health problems with Depression anxiety bipolar schizophrenia history  She is working with mental health provider team in [place], awaiting to be seen and can discuss med management She has therapist currently  Today due for renew refills  Dose increase Quetapine (Seroquel) from 100mg  up to 200mg  Continue Trazodone 50mg  nightly with refills  Add new rx Buspar (buspirone) 5mg  3 times a day for anxiety, as needed If it works well and need increased pill bottle, we can order 90 day let me know.  COPD Emphysema Refilled Spiriva inhaler for 90 day and refilled Albuterol  Chronic L Foot Pain Prior nerve damage or other post-op infection Referral to [facility] in [place].  Orders Placed This Encounter  Procedures   Ambulatory referral to Podiatry    Referral Priority:   Routine    Referral Type:   Consultation    Referral Reason:   Specialty Services Required    Requested Specialty:   Podiatry    Number of Visits Requested:   1     Meds ordered this encounter  Medications   QUEtiapine (SEROQUEL) 200 MG tablet    Sig: Take 1 tablet (200 mg total) by mouth at bedtime.    Dispense:  90 tablet    Refill:  3   traZODone (DESYREL) 50 MG tablet    Sig: Take 1 tablet (50 mg total) by mouth at bedtime.    Dispense:  90 tablet    Refill:  3   albuterol (VENTOLIN HFA) 108 (90 Base) MCG/ACT inhaler    Sig: Inhale 2 puffs into the lungs every 4 (four) hours as needed for wheezing or shortness of breath (cough).    Dispense:  1 each    Refill:  3   SPIRIVA RESPIMAT 2.5 MCG/ACT AERS    Sig: Inhale 2 puffs into the lungs daily.    Dispense:  12 g    Refill:  3    90 day supply   busPIRone (BUSPAR) 5 MG tablet     Sig: Take 1 tablet (5 mg total) by mouth 3 (three) times daily as needed (anxiety).    Dispense:  90 tablet    Refill:  2     Follow up plan: Return if symptoms worsen or fail to improve.  Patient verbalizes understanding with the above medical recommendations including the limitation of remote medical advice.  Specific follow-up and call-back criteria were given for patient to follow-up or seek medical care more urgently if needed.  Total duration of direct patient care provided via video conference: 15 minutes   Saralyn Pilar, DO [facility] 11/11/2022, 4:34 PM

## 2022-11-11 NOTE — Patient Instructions (Addendum)
Thank you for coming to the office today.  Dose increase Quetapine (Seroquel) from 100mg  up to 200mg  Continue Trazodone 50mg  nightly with refills  Add Buspar (buspirone) 5mg  3 times a day for anxiety, as needed If it works well and need increased pill bottle, we can order 90 day let me know.  Please work with mental health provider in [place] if they can manage Bipolar medication and prescribe. If you need me to refer you to other location, please contact us back.  Refilled Spiriva inhaler for 90 day and refilled Albuterol  Referral to [facility] in [place]. They will contact you.  Please schedule a Follow-up Appointment to: Return if symptoms worsen or fail to improve.  If you have any other questions or concerns, please feel free to call the office or send a message through MyChart. You may also schedule an earlier appointment if necessary.  Additionally, you may be receiving a survey about your experience at our office within a few days to 1 week by e-mail or mail. We value your feedback.  Saralyn Pilar, DO [facility], [place]

## 2022-12-02 ENCOUNTER — Ambulatory Visit (INDEPENDENT_AMBULATORY_CARE_PROVIDER_SITE_OTHER): Payer: 59 | Admitting: Podiatry

## 2022-12-02 ENCOUNTER — Ambulatory Visit (INDEPENDENT_AMBULATORY_CARE_PROVIDER_SITE_OTHER): Payer: 59

## 2022-12-02 ENCOUNTER — Encounter: Payer: Self-pay | Admitting: Podiatry

## 2022-12-02 DIAGNOSIS — M79672 Pain in left foot: Secondary | ICD-10-CM | POA: Diagnosis not present

## 2022-12-02 DIAGNOSIS — M722 Plantar fascial fibromatosis: Secondary | ICD-10-CM

## 2022-12-02 DIAGNOSIS — S96822S Laceration of other specified muscles and tendons at ankle and foot level, left foot, sequela: Secondary | ICD-10-CM

## 2022-12-02 NOTE — Progress Notes (Signed)
Chief Complaint  Patient presents with   Foot Pain    "I dropped a mirror on my foot.  It hurts on top of my foot.  My toes stay jacked up.  I get cramps in them." N - pain on top of foot L - dorsal, tarsal area D - 2017 O - suddenly, gotten worse C - sharp pain, throb, ache, tender A - shoes with shoe strings T - I had about 11 surgeries on it    HPI: 47 y.o. female presenting today as a new patient for evaluation of an injury that occurred about 7 years ago to the dorsal aspect of the left foot.  Patient states that she was cleaning a mirror and the mirror broke and fell down lacerating her left foot and ankle.  She underwent approximately 11 surgeries due to postoperative infections.  She has had multiple surgeries to the anterior aspect of the ankle extending into the dorsum of the foot.  She has pain with ambulation occasionally and edema as well  Past Medical History:  Diagnosis Date   ADHD    Bipolar 1 disorder (HCC)    Breast cancer (HCC)    currently in treatment for   Breast disorder    right breast cancer   COPD (chronic obstructive pulmonary disease) (HCC)    Depressed    Drug overdose 2010   Family history of breast cancer    Family history of colon cancer    Family history of prostate cancer    GERD (gastroesophageal reflux disease)    OCC   Headache    MIGRAINES (None for 2 months)   History of MRSA infection    foot infection   Personal history of radiation therapy 01/28/2019   right breast   Schizophrenia (HCC)    Stroke (HCC)    after foot surgery, left side was numb, fully recovered    Past Surgical History:  Procedure Laterality Date   BREAST BIOPSY Right 10/21/2018   Affirm Bx-"X" clip-path pending   BREAST LUMPECTOMY Right 12/02/2018   malignant   BREAST LUMPECTOMY WITH RADIOACTIVE SEED AND SENTINEL LYMPH NODE BIOPSY Right 12/02/2018   Procedure: RIGHT BREAST LUMPECTOMY WITH RADIOACTIVE SEED AND SENTINEL LYMPH NODE BIOPSY;  Surgeon: Griselda Miner, MD;  Location: [facility];  Service: General;  Laterality: Right;   INCISION AND DRAINAGE Left 01/09/2018   Procedure: INCISION AND DRAINAGE-below fascia foot; multiple;  Surgeon: Gwyneth Revels, DPM;  Location: [facility];  Service: Podiatry;  Laterality: Left;   INCISION AND DRAINAGE Left 04/17/2018   Procedure: I&D; BELOW FASCIA FOOT; MULTIPLE;  Surgeon: Gwyneth Revels, DPM;  Location: [facility];  Service: Podiatry;  Laterality: Left;   INCISION AND DRAINAGE Right 12/18/2018   Procedure: PLACEMENT OF DRAIN IN RIGHT AXILLA;  Surgeon: Griselda Miner, MD;  Location: [facility] OR;  Service: General;  Laterality: Right;   INCISION AND DRAINAGE Left 04/12/2020   Procedure: INCISION AND DRAINAGE BELOW FASCIA FOOT, SINGLE LEFT;  Surgeon: Gwyneth Revels, DPM;  Location[facility];  Service: Podiatry;  Laterality: Left;  leave first for transportation   REPAIR EXTENSOR TENDON Left 09/05/2017   Procedure: REPAIR EXTENSOR TENDON;  Surgeon: Gwyneth Revels, DPM;  Location: [facility];  Service: Podiatry;  Laterality: Left;    Allergies  Allergen Reactions   Shrimp [Shellfish Allergy] Hives   Bee Venom Swelling   Wasp Venom Swelling   Tomato Rash     Physical Exam: General: The patient is  alert and oriented x3 in no acute distress.  Dermatology: Skin is warm, dry and supple bilateral lower extremities.  Healed scars/incisions noted along the anterior aspect of the ankle and foot.  Majority incisions appear to be along the EHL tendon anatomy.  All incisions are healed  Vascular: Palpable pedal pulses bilaterally. Capillary refill within normal limits.  No appreciable edema.  No erythema.  Neurological: Grossly intact via light touch  Musculoskeletal Exam: There is tenderness with palpation along the anterior ankle and dorsum of the foot.  Loss of the ability to dorsiflex the great toe noted.  Loss of EHL integrity.  Moderate hammertoe contracture also noted  Radiographic Exam LT foot  12/02/2022:  Normal osseous mineralization. Joint spaces preserved.  No fractures or osseous irregularities noted.  Impression: Negative  Assessment/Plan of Care: 1.  History of foot laceration and postoperative infection leading to multiple surgeries left  -Recommend conservative treatment.  Good shoes -Advised against going barefoot -Patient currently takes meloxicam.  Continue -Compression ankle sleeve dispensed.  Wear daily -Return to clinic as needed    Felecia Shelling, DPM [facility]  Dr. Felecia Shelling, DPM    [address] [place], [place] [address]                Office [phone]  Fax [phone]

## 2022-12-05 ENCOUNTER — Encounter: Payer: Self-pay | Admitting: Hematology and Oncology

## 2022-12-05 NOTE — Progress Notes (Signed)
Patient called inquiring about applying for [employer].  Patient had previously. Advised patient I would check on current medication to see if she qualifies.  According to guidelines, pt current therapy Tamoxifen does not qualify for grant and confirmed with Lauren in social work.  Called patient back to advise.   She has my contact name and number for any additional financial questions or concerns.

## 2022-12-25 ENCOUNTER — Telehealth: Payer: Self-pay | Admitting: Family Medicine

## 2022-12-25 DIAGNOSIS — Z0279 Encounter for issue of other medical certificate: Secondary | ICD-10-CM

## 2022-12-25 NOTE — Telephone Encounter (Signed)
Patient called and stated her case worker, Earley Abide is asking for an FL2 form from Dr Kirtland Bouchard. Please advise. Patient is requesting for a copy to be uploaded to MyChart as well.   Patient states case workers email is [email or web address]   Please follow back up with patient @ # [phone]

## 2022-12-25 NOTE — Telephone Encounter (Signed)
A new updated FL2 has been completed.  The previous one was from 2023.  I did leave section 10-11 blank, which is current and recommended level of care. Often social work requests updated FL2 with these blank and they can be updated if the needs of the patient change. Or we can reprint one with these filled out if she needs.  The Form and a letter written by me will be ready to email to patient's social worker.  I do not have a way to attach a printed form to mychart, so I cannot complete that request.  Email: [email or web address] Sear, DO [facility] 12/25/2022, 5:25 PM

## 2022-12-26 ENCOUNTER — Telehealth: Payer: Self-pay | Admitting: Family Medicine

## 2022-12-26 ENCOUNTER — Ambulatory Visit: Payer: 59

## 2022-12-26 DIAGNOSIS — Z0000 Encounter for general adult medical examination without abnormal findings: Secondary | ICD-10-CM | POA: Diagnosis not present

## 2022-12-26 NOTE — Patient Instructions (Addendum)
Carol Crawford , Thank you for taking time to come for your Medicare Wellness Visit. I appreciate your ongoing commitment to your health goals. Please review the following plan we discussed and let me know if I can assist you in the future.   Referrals/Orders/Follow-Ups/Clinician Recommendations: none  This is a list of the screening recommended for you and due dates:  Health Maintenance  Topic Date Due   COVID-19 Vaccine (1) Never done   DTaP/Tdap/Td vaccine (1 - Tdap) Never done   Pap with HPV screening  08/06/2020   Flu Shot  Never done   Medicare Annual Wellness Visit  12/26/2023   Hepatitis C Screening  Completed   HIV Screening  Completed   HPV Vaccine  Aged Out    Advanced directives: (ACP Link)Information on Advanced Care Planning can be found at [facility] Advance Health Care Directives Advance Health Care Directives ([email or web address]   Next Medicare Annual Wellness Visit scheduled for next year: Yes   01/01/24 @ 11:35 am by video

## 2022-12-26 NOTE — Telephone Encounter (Signed)
Pt is calling in because she wants to know if he can send a copy of the FL2 form to her email : [email or web address], pt says sometimes her social worker has an issue getting emails and she wants to have a copy just in case.

## 2022-12-26 NOTE — Progress Notes (Signed)
Subjective:   Carol Crawford is a 47 y.o. female who presents for Medicare Annual (Subsequent) preventive examination.  Visit Complete: Virtual  I connected with  Carol Crawford on 12/26/22 by a audio enabled telemedicine application and verified that I am speaking with the correct person using two identifiers.  Patient Location: Home  Provider Location: Office/Clinic  I discussed the limitations of evaluation and management by telemedicine. The patient expressed understanding and agreed to proceed.  Because this visit was a virtual/telehealth visit, some criteria may be missing or patient reported. Any vitals not documented were not able to be obtained and vitals that have been documented are patient reported.   Cardiac Risk Factors include: advanced age (>45men, >49 women)     Objective:    Today's Vitals   12/26/22 1500  PainSc: 3    There is no height or weight on file to calculate BMI.     12/26/2022    3:05 PM 09/30/2022    2:11 PM 01/08/2022    3:46 PM 09/19/2021    4:30 PM 07/27/2021    3:52 PM 09/30/2020    7:18 PM 08/28/2020    4:05 PM  Advanced Directives  Does Patient Have a Medical Advance Directive? No Yes No No No No No  Type of Advance Directive  Healthcare Power of Attorney       Does patient want to make changes to medical advance directive?  Yes (MAU/Ambulatory/Procedural Areas - Information given)       Copy of Healthcare Power of Attorney in Chart?  No - copy requested       Would patient like information on creating a medical advance directive? No - Patient declined   No - Patient declined No - Patient declined  No - Patient declined    Current Medications (verified) Outpatient Encounter Medications as of 12/26/2022  Medication Sig   acetaminophen (TYLENOL) 500 MG tablet Take 500 mg by mouth every 6 (six) hours as needed for moderate pain or mild pain.   albuterol (VENTOLIN HFA) 108 (90 Base) MCG/ACT inhaler Inhale 2 puffs into the lungs every 4 (four)  hours as needed for wheezing or shortness of breath (cough).   Ascorbic Acid (VITAMIN C PO) Take by mouth.   busPIRone (BUSPAR) 5 MG tablet Take 1 tablet (5 mg total) by mouth 3 (three) times daily as needed (anxiety).   cyclobenzaprine (FLEXERIL) 5 MG tablet Take 1 tablet (5 mg total) by mouth 3 (three) times daily as needed for muscle spasms.   diclofenac Sodium (VOLTAREN) 1 % GEL Apply 1 Application topically 2 (two) times daily.   gabapentin (NEURONTIN) 300 MG capsule Take 1 capsule (300 mg total) by mouth at bedtime.   ibuprofen (ADVIL) 400 MG tablet Take 800 mg by mouth every 6 (six) hours as needed.   nicotine (NICODERM CQ) 14 mg/24hr patch Place 1 patch (14 mg total) onto the skin daily. For 28 days   QUEtiapine (SEROQUEL) 200 MG tablet Take 1 tablet (200 mg total) by mouth at bedtime.   SPIRIVA RESPIMAT 2.5 MCG/ACT AERS Inhale 2 puffs into the lungs daily.   tamoxifen (NOLVADEX) 20 MG tablet Take 0.5 tablets (10 mg total) by mouth daily.   traZODone (DESYREL) 50 MG tablet Take 1 tablet (50 mg total) by mouth at bedtime.   No facility-administered encounter medications on file as of 12/26/2022.    Allergies (verified) Shrimp [shellfish allergy], Bee venom, Wasp venom, and Tomato   History: Past Medical History:  Diagnosis Date   ADHD    Bipolar 1 disorder (HCC)    Breast cancer (HCC)    currently in treatment for   Breast disorder    right breast cancer   COPD (chronic obstructive pulmonary disease) (HCC)    Depressed    Drug overdose 2010   Family history of breast cancer    Family history of colon cancer    Family history of prostate cancer    GERD (gastroesophageal reflux disease)    OCC   Headache    MIGRAINES (None for 2 months)   History of MRSA infection    foot infection   Personal history of radiation therapy 01/28/2019   right breast   Schizophrenia (HCC)    Stroke (HCC)    after foot surgery, left side was numb, fully recovered   Past Surgical History:   Procedure Laterality Date   BREAST BIOPSY Right 10/21/2018   Affirm Bx-"X" clip-path pending   BREAST LUMPECTOMY Right 12/02/2018   malignant   BREAST LUMPECTOMY WITH RADIOACTIVE SEED AND SENTINEL LYMPH NODE BIOPSY Right 12/02/2018   Procedure: RIGHT BREAST LUMPECTOMY WITH RADIOACTIVE SEED AND SENTINEL LYMPH NODE BIOPSY;  Surgeon: Griselda Miner, MD;  Location: [facility];  Service: General;  Laterality: Right;   INCISION AND DRAINAGE Left 01/09/2018   Procedure: INCISION AND DRAINAGE-below fascia foot; multiple;  Surgeon: Gwyneth Revels, DPM;  Location: [facility];  Service: Podiatry;  Laterality: Left;   INCISION AND DRAINAGE Left 04/17/2018   Procedure: I&D; BELOW FASCIA FOOT; MULTIPLE;  Surgeon: Gwyneth Revels, DPM;  Location: [facility];  Service: Podiatry;  Laterality: Left;   INCISION AND DRAINAGE Right 12/18/2018   Procedure: PLACEMENT OF DRAIN IN RIGHT AXILLA;  Surgeon: Griselda Miner, MD;  Location: [facility] OR;  Service: General;  Laterality: Right;   INCISION AND DRAINAGE Left 04/12/2020   Procedure: INCISION AND DRAINAGE BELOW FASCIA FOOT, SINGLE LEFT;  Surgeon: Gwyneth Revels, DPM;  Location: [facility];  Service: Podiatry;  Laterality: Left;  leave first for transportation   REPAIR EXTENSOR TENDON Left 09/05/2017   Procedure: REPAIR EXTENSOR TENDON;  Surgeon: Gwyneth Revels, DPM;  Location: [facility];  Service: Podiatry;  Laterality: Left;   Family History  Problem Relation Age of Onset   Arthritis Mother    Hypertension Mother    Cervical cancer Mother    Prostate cancer Father    Lung cancer Father    Diabetes Paternal Aunt    Breast cancer Paternal Aunt    ADD / ADHD Daughter    Bipolar disorder Daughter    Hypertension Maternal Grandmother    Stroke Maternal Grandmother    Prostate cancer Maternal Grandfather    Hypertension Maternal Grandfather    Colon cancer Maternal Grandfather    Hypertension Paternal Grandfather    Stroke Paternal Grandfather     Breast cancer Cousin    Social History   Socioeconomic History   Marital status: Single    Spouse name: Not on file   Number of children: 2   Years of education: Not on file   Highest education level: Not on file  Occupational History   Not on file  Tobacco Use   Smoking status: Every Day    Current packs/day: 0.25    Average packs/day: 0.3 packs/day for 30.0 years (7.5 ttl pk-yrs)    Types: Cigarettes   Smokeless tobacco: Never   Tobacco comments:    6 cigs daily  Vaping Use   Vaping status:  Former   Start date: 08/22/2017  Substance and Sexual Activity   Alcohol use: Yes    Alcohol/week: 2.0 standard drinks of alcohol    Types: 2 Cans of beer per week    Comment: occasionally   Drug use: Yes    Types: Marijuana    Comment: last use 01/10/21 2x/wk   Sexual activity: Yes    Partners: Male  Other Topics Concern   Not on file  Social History Narrative   Not on file   Social Determinants of Health   Financial Resource Strain: Low Risk  (12/26/2022)   Overall Financial Resource Strain (CARDIA)    Difficulty of Paying Living Expenses: Not hard at all  Food Insecurity: No Food Insecurity (12/26/2022)   Hunger Vital Sign    Worried About Running Out of Food in the Last Year: Never true    Ran Out of Food in the Last Year: Never true  Transportation Needs: Unmet Transportation Needs (12/26/2022)   PRAPARE - Administrator, Civil Service (Medical): Yes    Lack of Transportation (Non-Medical): No  Physical Activity: Sufficiently Active (12/26/2022)   Exercise Vital Sign    Days of Exercise per Week: 7 days    Minutes of Exercise per Session: 30 min  Stress: No Stress Concern Present (12/26/2022)   [employer] of Occupational Health - Occupational Stress Questionnaire    Feeling of Stress : Not at all  Social Connections: Moderately Integrated (12/26/2022)   Social Connection and Isolation Panel [NHANES]    Frequency of Communication with Friends and Family:  More than three times a week    Frequency of Social Gatherings with Friends and Family: Twice a week    Attends Religious Services: More than 4 times per year    Active Member of [employer] or Organizations: Yes    Attends Engineer, structural: More than 4 times per year    Marital Status: Never married    Tobacco Counseling Ready to quit: Not Answered Counseling given: Not Answered Tobacco comments: 6 cigs daily   Clinical Intake:  Pre-visit preparation completed: Yes  Pain : 0-10 Pain Score: 3  Pain Type: Chronic pain Pain Location: Shoulder Pain Orientation: Right Pain Descriptors / Indicators: Aching Pain Onset: More than a month ago Pain Frequency: Intermittent Pain Relieving Factors: gabepentin, tylenol  Pain Relieving Factors: gabepentin, tylenol  Nutritional Status: BMI 25 -29 Overweight Nutritional Risks: None Diabetes: No  How often do you need to have someone help you when you read instructions, pamphlets, or other written materials from your doctor or pharmacy?: 1 - Never  Interpreter Needed?: No  Information entered by :: Kennedy Bucker, LPN   Activities of Daily Living    12/26/2022    3:06 PM 12/26/2022    2:36 PM  In your present state of health, do you have any difficulty performing the following activities:  Hearing? 0 0  Vision? 0 0  Difficulty concentrating or making decisions? 0 0  Walking or climbing stairs? 0 0  Dressing or bathing? 0 0  Doing errands, shopping? 0 0  Preparing Food and eating ? N N  Using the Toilet? N N  In the past six months, have you accidently leaked urine? N N  Do you have problems with loss of bowel control? N N  Managing your Medications? N N  Managing your Finances? N N  Housekeeping or managing your Housekeeping? N N    Patient Care Team: Smitty Cords, DO  as PCP - General (Family Medicine) Jim Like, RN as Registered Nurse Scarlett Presto, RN (Inactive) as Registered Nurse Griselda Miner, MD as Consulting Physician (General Surgery) Serena Croissant, MD as Consulting Physician (Hematology and Oncology) Dorothy Puffer, MD as Consulting Physician (Radiation Oncology) Bridgett Larsson, LCSW as Social Worker (Licensed Clinical Social Worker)  Indicate any recent Medical Services you may have received from other than [facility] providers in the past year (date may be approximate).     Assessment:   This is a routine wellness examination for Keierra.  Hearing/Vision screen Hearing Screening - Comments:: No aids Vision Screening - Comments:: Wears glasses- MD in [place]   Goals Addressed             This Visit's Progress    DIET - EAT MORE FRUITS AND VEGETABLES         Depression Screen    12/26/2022    3:03 PM 11/11/2022    4:28 PM 09/06/2021   10:11 AM 09/18/2020   10:13 AM 09/17/2019    1:55 PM 09/17/2019   11:44 AM  PHQ 2/9 Scores  PHQ - 2 Score 0 6 4 4 5 5   PHQ- 9 Score 0 16 16 16 14      Fall Risk    12/26/2022    3:06 PM 12/26/2022    2:36 PM 11/11/2022    4:28 PM 09/06/2021   10:10 AM 09/18/2020   10:12 AM  Fall Risk   Falls in the past year? 0 0 0 1 1  Number falls in past yr: 0 0 0 0 1  Injury with Fall? 0 0 0 1 0  Risk for fall due to : No Fall Risks  No Fall Risks Impaired mobility   Follow up Falls prevention discussed;Falls evaluation completed  Falls evaluation completed Falls evaluation completed Falls evaluation completed    MEDICARE RISK AT HOME: Medicare Risk at Home Any stairs in or around the home?: Yes If so, are there any without handrails?: No Home free of loose throw rugs in walkways, pet beds, electrical cords, etc?: No Adequate lighting in your home to reduce risk of falls?: Yes Life alert?: No Use of a cane, walker or w/c?: Yes (cane occasionally) Grab bars in the bathroom?: Yes Shower chair or bench in shower?: Yes Elevated toilet seat or a handicapped toilet?: Yes  TIMED UP AND GO:  Was the test performed?  No    Cognitive  Function:        12/26/2022    3:07 PM 09/06/2021   10:21 AM  6CIT Screen  What Year? 0 points 0 points  What month? 0 points 0 points  What time? 0 points 0 points  Count back from 20 0 points 0 points  Months in reverse 0 points 0 points  Repeat phrase 0 points 0 points  Total Score 0 points 0 points    Immunizations Immunization History  Administered Date(s) Administered   PPD Test 10/05/2020    TDAP status: Due, Education has been provided regarding the importance of this vaccine. Advised may receive this vaccine at local pharmacy or Health Dept. Aware to provide a copy of the vaccination record if obtained from local pharmacy or Health Dept. Verbalized acceptance and understanding.  Flu Vaccine status: Declined, Education has been provided regarding the importance of this vaccine but patient still declined. Advised may receive this vaccine at local pharmacy or Health Dept. Aware to provide a copy of the vaccination record  if obtained from local pharmacy or Health Dept. Verbalized acceptance and understanding.  Pneumococcal vaccine status: Declined,  Education has been provided regarding the importance of this vaccine but patient still declined. Advised may receive this vaccine at local pharmacy or Health Dept. Aware to provide a copy of the vaccination record if obtained from local pharmacy or Health Dept. Verbalized acceptance and understanding.   Covid-19 vaccine status: Completed vaccines  Qualifies for Shingles Vaccine? Yes   Zostavax completed No   Shingrix Completed?: No.    Education has been provided regarding the importance of this vaccine. Patient has been advised to call insurance company to determine out of pocket expense if they have not yet received this vaccine. Advised may also receive vaccine at local pharmacy or Health Dept. Verbalized acceptance and understanding.  Screening Tests Health Maintenance  Topic Date Due   COVID-19 Vaccine (1) Never done    DTaP/Tdap/Td (1 - Tdap) Never done   Cervical Cancer Screening (HPV/Pap Cotest)  08/06/2020   INFLUENZA VACCINE  Never done   Medicare Annual Wellness (AWV)  12/26/2023   Hepatitis C Screening  Completed   HIV Screening  Completed   HPV VACCINES  Aged Out    Health Maintenance  Health Maintenance Due  Topic Date Due   COVID-19 Vaccine (1) Never done   DTaP/Tdap/Td (1 - Tdap) Never done   Cervical Cancer Screening (HPV/Pap Cotest)  08/06/2020   INFLUENZA VACCINE  Never done    Lung Cancer Screening: (Low Dose CT Chest recommended if Age 11-80 years, 20 pack-year currently smoking OR have quit w/in 15years.) does qualify.   Additional Screening:  Hepatitis C Screening: does qualify; Completed 01/12/21  Vision Screening: Recommended annual ophthalmology exams for early detection of glaucoma and other disorders of the eye. Is the patient up to date with their annual eye exam?  Yes  Who is the provider or what is the name of the office in which the patient attends annual eye exams? [facility] in [place] If pt is not established with a provider, would they like to be referred to a provider to establish care? No .   Dental Screening: Recommended annual dental exams for proper oral hygiene   Community Resource Referral / Chronic Care Management: CRR required this visit?  No   CCM required this visit?  No     Plan:     I have personally reviewed and noted the following in the patient's chart:   Medical and social history Use of alcohol, tobacco or illicit drugs  Current medications and supplements including opioid prescriptions. Patient is not currently taking opioid prescriptions. Functional ability and status Nutritional status Physical activity Advanced directives List of other physicians Hospitalizations, surgeries, and ER visits in previous 12 months Vitals Screenings to include cognitive, depression, and falls Referrals and appointments  In addition, I have reviewed and  discussed with patient certain preventive protocols, quality metrics, and best practice recommendations. A written personalized care plan for preventive services as well as general preventive health recommendations were provided to patient.     Hal Hope, LPN   [id number]   After Visit Summary: (MyChart) Due to this being a telephonic visit, the after visit summary with patients personalized plan was offered to patient via MyChart   Nurse Notes: none

## 2023-01-01 ENCOUNTER — Telehealth: Payer: Self-pay | Admitting: Family Medicine

## 2023-01-01 NOTE — Telephone Encounter (Signed)
Carol Crawford [phone] from [facility] called stated she needs the [facility] completed with the corrections needed in box 5, 8, 10 and 11. Please refer back to CRM dated 10/9.

## 2023-01-02 NOTE — Telephone Encounter (Signed)
L/M for   Carol Crawford to confirm she got my  email on yesterday

## 2023-01-02 NOTE — Telephone Encounter (Signed)
Carol Crawford from [place] called in states she didnt get the email with the form

## 2023-01-13 NOTE — Telephone Encounter (Signed)
I called re-emailed the pt. I called her back to  confirm that she did received the email that was sent on 10/21

## 2023-01-13 NOTE — Telephone Encounter (Signed)
Pt called in states didn't receive the FL2 info in my chart

## 2023-02-17 ENCOUNTER — Telehealth: Payer: Self-pay

## 2023-02-17 NOTE — Telephone Encounter (Signed)
Copied from CRM [id number]. Topic: General - Inquiry >> Feb 17, 2023 11:00 AM Haroldine Laws wrote: Reason for CRM: pt wants to know if Dr. Kirtland Bouchard can write her a letter for her dog to be an emotional support animal.  CB#  [phone]

## 2023-02-17 NOTE — Telephone Encounter (Signed)
I have a few clarifying questions first, if you can follow up with patient.  Can she clarify what the letter is for - is it documentation for housing or other reason?  Normally this type of documentation would require an office visit to document, however she was already seen within the past 3 months in August 2024. This could count because her mental health was discussed.  I would need more specifics on her pet, type of dog and name, and specific examples of how her pet helps support her emotions / mental health.  - For example, playing with my dog helps reduce anxiety and stress. Etc a few examples of direct ways the pet provides support and what symptom is helped.  If she wants to schedule MyChart Virtual Visit that would be good and we can handle this letter. Otherwise, if she wants to answer these questions, I can write it later this week - but there is a fee for the form without an office visit, $20+ usually for a Letter.  Thank you!  Saralyn Pilar, DO [facility] 02/17/2023, 1:27 PM

## 2023-02-19 ENCOUNTER — Encounter: Payer: Self-pay | Admitting: Family Medicine

## 2023-02-19 ENCOUNTER — Telehealth (INDEPENDENT_AMBULATORY_CARE_PROVIDER_SITE_OTHER): Payer: 59 | Admitting: Family Medicine

## 2023-02-19 DIAGNOSIS — F331 Major depressive disorder, recurrent, moderate: Secondary | ICD-10-CM

## 2023-02-19 DIAGNOSIS — F419 Anxiety disorder, unspecified: Secondary | ICD-10-CM | POA: Diagnosis not present

## 2023-02-19 DIAGNOSIS — F319 Bipolar disorder, unspecified: Secondary | ICD-10-CM | POA: Diagnosis not present

## 2023-02-19 DIAGNOSIS — F209 Schizophrenia, unspecified: Secondary | ICD-10-CM | POA: Diagnosis not present

## 2023-02-19 NOTE — Patient Instructions (Addendum)
Thank you for coming to the office today.    Please schedule a Follow-up Appointment to: Return if symptoms worsen or fail to improve.  If you have any other questions or concerns, please feel free to call the office or send a message through MyChart. You may also schedule an earlier appointment if necessary.  Additionally, you may be receiving a survey about your experience at our office within a few days to 1 week by e-mail or mail. We value your feedback.  Saralyn Pilar, DO [facility], [place]
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
## 2023-03-12 DIAGNOSIS — Z8619 Personal history of other infectious and parasitic diseases: Secondary | ICD-10-CM | POA: Diagnosis not present

## 2023-03-12 DIAGNOSIS — Z853 Personal history of malignant neoplasm of breast: Secondary | ICD-10-CM | POA: Diagnosis not present

## 2023-03-12 DIAGNOSIS — Z131 Encounter for screening for diabetes mellitus: Secondary | ICD-10-CM | POA: Diagnosis not present

## 2023-04-28 DIAGNOSIS — J029 Acute pharyngitis, unspecified: Secondary | ICD-10-CM | POA: Diagnosis not present

## 2023-04-28 DIAGNOSIS — Z1152 Encounter for screening for COVID-19: Secondary | ICD-10-CM | POA: Diagnosis not present

## 2023-04-28 DIAGNOSIS — J209 Acute bronchitis, unspecified: Secondary | ICD-10-CM | POA: Diagnosis not present

## 2023-04-28 DIAGNOSIS — J440 Chronic obstructive pulmonary disease with acute lower respiratory infection: Secondary | ICD-10-CM | POA: Diagnosis not present

## 2023-06-13 ENCOUNTER — Encounter: Payer: Self-pay | Admitting: Adult Health

## 2023-06-13 ENCOUNTER — Other Ambulatory Visit: Payer: Self-pay | Admitting: *Deleted

## 2023-06-13 DIAGNOSIS — N644 Mastodynia: Secondary | ICD-10-CM

## 2023-06-13 DIAGNOSIS — C50411 Malignant neoplasm of upper-outer quadrant of right female breast: Secondary | ICD-10-CM

## 2023-06-13 DIAGNOSIS — Z1231 Encounter for screening mammogram for malignant neoplasm of breast: Secondary | ICD-10-CM

## 2023-06-13 NOTE — Progress Notes (Signed)
 Received call from pt with complaint of new right sided breast pain.  Per MD pt needing diagnostic mammogram as well as right sided breast US. Orders placed, pt notified and verbalized understanding to contact the breast center.

## 2023-06-26 ENCOUNTER — Other Ambulatory Visit: Payer: Self-pay | Admitting: Hematology and Oncology

## 2023-06-26 ENCOUNTER — Ambulatory Visit
Admission: RE | Admit: 2023-06-26 | Discharge: 2023-06-26 | Disposition: A | Discharge status: Home / Self Care | Source: Ambulatory Visit | Attending: Hematology and Oncology

## 2023-06-26 DIAGNOSIS — N644 Mastodynia: Secondary | ICD-10-CM | POA: Diagnosis not present

## 2023-06-26 DIAGNOSIS — Z170 Estrogen receptor positive status [ER+]: Secondary | ICD-10-CM

## 2023-07-03 ENCOUNTER — Telehealth: Payer: Self-pay

## 2023-07-03 DIAGNOSIS — J45909 Unspecified asthma, uncomplicated: Secondary | ICD-10-CM

## 2023-07-03 DIAGNOSIS — J432 Centrilobular emphysema: Secondary | ICD-10-CM

## 2023-07-03 NOTE — Telephone Encounter (Signed)
 Spoke with patient, stated her apartment has mold which is concerning. Also, there is a sewage smell that is affecting her asthma and allergies. Her heating and air have not worked since Saturday which she stated is making her have hot flashes really bad.

## 2023-07-03 NOTE — Telephone Encounter (Signed)
 I am not quite sure what she needs based on this request. I think we're missing information. Can you clarify this request with patient? What does she need the letter to say specifically?  Saralyn Pilar, DO [facility] 07/03/2023, 10:14 AM

## 2023-07-03 NOTE — Telephone Encounter (Signed)
 Copied from CRM [id number]. Topic: General - Other >> Jul 03, 2023 10:02 AM Sim Boast F wrote: Reason for CRM: Patient called to request a letter for property management and case worker because she's having hot flashes and asthma due to mold in her apartment and she hasn't had heat or air since Saturday . Please cal her at [phone].

## 2023-07-03 NOTE — Telephone Encounter (Signed)
 Letter written. Sent to [employer]. Can you send her a message or call and let her know that letter is online now and if she needs printed and signed copy we can print it and she would have to pick it up or we can mail it possibly next week.  Carol Pilar, DO [facility] 07/03/2023, 11:47 AM

## 2023-07-03 NOTE — Telephone Encounter (Signed)
 Left message for patient to return call.

## 2023-07-03 NOTE — Telephone Encounter (Signed)
 Sign copy of letter mailed to patient.

## 2023-07-20 ENCOUNTER — Other Ambulatory Visit: Payer: Self-pay

## 2023-07-20 ENCOUNTER — Emergency Department (HOSPITAL_COMMUNITY)

## 2023-07-20 ENCOUNTER — Inpatient Hospital Stay (HOSPITAL_COMMUNITY)
Admission: EM | Admit: 2023-07-20 | Discharge: 2023-07-22 | DRG: 153 | Disposition: A | Discharge status: Home / Self Care | Attending: Internal Medicine | Admitting: Internal Medicine

## 2023-07-20 ENCOUNTER — Encounter (HOSPITAL_COMMUNITY): Payer: Self-pay | Admitting: Internal Medicine

## 2023-07-20 DIAGNOSIS — Z880 Allergy status to penicillin: Secondary | ICD-10-CM

## 2023-07-20 DIAGNOSIS — N760 Acute vaginitis: Secondary | ICD-10-CM | POA: Diagnosis present

## 2023-07-20 DIAGNOSIS — Z8619 Personal history of other infectious and parasitic diseases: Secondary | ICD-10-CM | POA: Diagnosis not present

## 2023-07-20 DIAGNOSIS — Z7251 High risk heterosexual behavior: Secondary | ICD-10-CM | POA: Diagnosis not present

## 2023-07-20 DIAGNOSIS — Z91030 Bee allergy status: Secondary | ICD-10-CM

## 2023-07-20 DIAGNOSIS — Z91018 Allergy to other foods: Secondary | ICD-10-CM | POA: Diagnosis not present

## 2023-07-20 DIAGNOSIS — J4520 Mild intermittent asthma, uncomplicated: Secondary | ICD-10-CM | POA: Diagnosis not present

## 2023-07-20 DIAGNOSIS — Z881 Allergy status to other antibiotic agents status: Secondary | ICD-10-CM

## 2023-07-20 DIAGNOSIS — B9689 Other specified bacterial agents as the cause of diseases classified elsewhere: Secondary | ICD-10-CM | POA: Diagnosis not present

## 2023-07-20 DIAGNOSIS — Z833 Family history of diabetes mellitus: Secondary | ICD-10-CM

## 2023-07-20 DIAGNOSIS — Z8042 Family history of malignant neoplasm of prostate: Secondary | ICD-10-CM | POA: Diagnosis not present

## 2023-07-20 DIAGNOSIS — F411 Generalized anxiety disorder: Secondary | ICD-10-CM | POA: Diagnosis present

## 2023-07-20 DIAGNOSIS — Z8673 Personal history of transient ischemic attack (TIA), and cerebral infarction without residual deficits: Secondary | ICD-10-CM

## 2023-07-20 DIAGNOSIS — F209 Schizophrenia, unspecified: Secondary | ICD-10-CM | POA: Diagnosis present

## 2023-07-20 DIAGNOSIS — Z803 Family history of malignant neoplasm of breast: Secondary | ICD-10-CM

## 2023-07-20 DIAGNOSIS — Z8614 Personal history of Methicillin resistant Staphylococcus aureus infection: Secondary | ICD-10-CM

## 2023-07-20 DIAGNOSIS — R1310 Dysphagia, unspecified: Secondary | ICD-10-CM | POA: Diagnosis present

## 2023-07-20 DIAGNOSIS — Z79810 Long term (current) use of selective estrogen receptor modulators (SERMs): Secondary | ICD-10-CM | POA: Diagnosis not present

## 2023-07-20 DIAGNOSIS — M47812 Spondylosis without myelopathy or radiculopathy, cervical region: Secondary | ICD-10-CM | POA: Diagnosis present

## 2023-07-20 DIAGNOSIS — K219 Gastro-esophageal reflux disease without esophagitis: Secondary | ICD-10-CM | POA: Diagnosis present

## 2023-07-20 DIAGNOSIS — Z923 Personal history of irradiation: Secondary | ICD-10-CM

## 2023-07-20 DIAGNOSIS — F909 Attention-deficit hyperactivity disorder, unspecified type: Secondary | ICD-10-CM | POA: Diagnosis present

## 2023-07-20 DIAGNOSIS — C50911 Malignant neoplasm of unspecified site of right female breast: Secondary | ICD-10-CM | POA: Diagnosis present

## 2023-07-20 DIAGNOSIS — Z8261 Family history of arthritis: Secondary | ICD-10-CM

## 2023-07-20 DIAGNOSIS — Z853 Personal history of malignant neoplasm of breast: Secondary | ICD-10-CM | POA: Diagnosis not present

## 2023-07-20 DIAGNOSIS — J0510 Acute epiglottitis without obstruction: Secondary | ICD-10-CM | POA: Diagnosis present

## 2023-07-20 DIAGNOSIS — J4489 Other specified chronic obstructive pulmonary disease: Secondary | ICD-10-CM | POA: Diagnosis present

## 2023-07-20 DIAGNOSIS — Z823 Family history of stroke: Secondary | ICD-10-CM

## 2023-07-20 DIAGNOSIS — Z8049 Family history of malignant neoplasm of other genital organs: Secondary | ICD-10-CM

## 2023-07-20 DIAGNOSIS — J45909 Unspecified asthma, uncomplicated: Secondary | ICD-10-CM | POA: Insufficient documentation

## 2023-07-20 DIAGNOSIS — F319 Bipolar disorder, unspecified: Secondary | ICD-10-CM | POA: Diagnosis present

## 2023-07-20 DIAGNOSIS — Z8249 Family history of ischemic heart disease and other diseases of the circulatory system: Secondary | ICD-10-CM

## 2023-07-20 DIAGNOSIS — Z800 Family history of malignant neoplasm of digestive organs: Secondary | ICD-10-CM | POA: Diagnosis not present

## 2023-07-20 DIAGNOSIS — R221 Localized swelling, mass and lump, neck: Secondary | ICD-10-CM | POA: Diagnosis not present

## 2023-07-20 DIAGNOSIS — J352 Hypertrophy of adenoids: Secondary | ICD-10-CM | POA: Diagnosis not present

## 2023-07-20 DIAGNOSIS — Z91013 Allergy to seafood: Secondary | ICD-10-CM | POA: Diagnosis not present

## 2023-07-20 DIAGNOSIS — Z79899 Other long term (current) drug therapy: Secondary | ICD-10-CM

## 2023-07-20 DIAGNOSIS — F17210 Nicotine dependence, cigarettes, uncomplicated: Secondary | ICD-10-CM | POA: Diagnosis present

## 2023-07-20 DIAGNOSIS — Z801 Family history of malignant neoplasm of trachea, bronchus and lung: Secondary | ICD-10-CM

## 2023-07-20 LAB — URINALYSIS, ROUTINE W REFLEX MICROSCOPIC
Bilirubin Urine: NEGATIVE
Glucose, UA: NEGATIVE mg/dL
Ketones, ur: NEGATIVE mg/dL
Leukocytes,Ua: NEGATIVE
Nitrite: NEGATIVE
Protein, ur: NEGATIVE mg/dL
Specific Gravity, Urine: 1.01 (ref 1.005–1.030)
pH: 5 (ref 5.0–8.0)

## 2023-07-20 LAB — BASIC METABOLIC PANEL WITH GFR
Anion gap: 9 (ref 5–15)
BUN: 8 mg/dL (ref 6–20)
CO2: 20 mmol/L — ABNORMAL LOW (ref 22–32)
Calcium: 8.9 mg/dL (ref 8.9–10.3)
Chloride: 106 mmol/L (ref 98–111)
Creatinine, Ser: 0.85 mg/dL (ref 0.44–1.00)
GFR, Estimated: >60 mL/min (ref >60)
Glucose, Bld: 99 mg/dL (ref 70–99)
Potassium: 3.6 mmol/L (ref 3.5–5.1)
Sodium: 135 mmol/L (ref 135–145)

## 2023-07-20 LAB — CBC WITH DIFFERENTIAL/PLATELET
Abs Immature Granulocytes: 0.01 10*3/uL (ref 0.00–0.07)
Basophils Absolute: 0 10*3/uL (ref 0.0–0.1)
Basophils Relative: 0 %
Eosinophils Absolute: 0 10*3/uL (ref 0.0–0.5)
Eosinophils Relative: 0 %
HCT: 38.6 % (ref 36.0–46.0)
Hemoglobin: 13.4 g/dL (ref 12.0–15.0)
Immature Granulocytes: 0 %
Lymphocytes Relative: 49 %
Lymphs Abs: 3.3 10*3/uL (ref 0.7–4.0)
MCH: 33.2 pg (ref 26.0–34.0)
MCHC: 34.7 g/dL (ref 30.0–36.0)
MCV: 95.5 fL (ref 80.0–100.0)
Monocytes Absolute: 0.5 10*3/uL (ref 0.1–1.0)
Monocytes Relative: 7 %
Neutro Abs: 2.9 10*3/uL (ref 1.7–7.7)
Neutrophils Relative %: 44 %
Platelets: 243 10*3/uL (ref 150–400)
RBC: 4.04 MIL/uL (ref 3.87–5.11)
RDW: 12.1 % (ref 11.5–15.5)
WBC: 6.8 10*3/uL (ref 4.0–10.5)
nRBC: 0 % (ref 0.0–0.2)

## 2023-07-20 LAB — WET PREP, GENITAL
Sperm: NONE SEEN
Trich, Wet Prep: NONE SEEN
WBC, Wet Prep HPF POC: <10 (ref <10)
Yeast Wet Prep HPF POC: NONE SEEN

## 2023-07-20 MED ORDER — ONDANSETRON HCL 4 MG/2ML IJ SOLN
4.0000 mg | Freq: Once | INTRAMUSCULAR | Status: AC
  Administered 2023-07-20: 4 mg via INTRAVENOUS
  Filled 2023-07-20: qty 2

## 2023-07-20 MED ORDER — METHYLPREDNISOLONE SODIUM SUCC 40 MG IJ SOLR
40.0000 mg | Freq: Every day | INTRAMUSCULAR | Status: DC
  Administered 2023-07-21 – 2023-07-22 (×2): 40 mg via INTRAVENOUS
  Filled 2023-07-20 (×2): qty 1

## 2023-07-20 MED ORDER — RACEPINEPHRINE HCL 2.25 % IN NEBU: 0.5000 mL | INHALATION_SOLUTION | RESPIRATORY_TRACT | Status: DC | PRN

## 2023-07-20 MED ORDER — TIOTROPIUM BROMIDE MONOHYDRATE 2.5 MCG/ACT IN AERS: 2.0000 | INHALATION_SPRAY | Freq: Every day | RESPIRATORY_TRACT | Status: DC

## 2023-07-20 MED ORDER — METRONIDAZOLE 500 MG/100ML IV SOLN
500.0000 mg | Freq: Two times a day (BID) | INTRAVENOUS | Status: DC
Start: 2023-07-20 — End: 2023-07-27
  Administered 2023-07-20 – 2023-07-21 (×2): 500 mg via INTRAVENOUS
  Filled 2023-07-20 (×2): qty 100

## 2023-07-20 MED ORDER — ENOXAPARIN SODIUM 40 MG/0.4ML IJ SOSY
40.0000 mg | PREFILLED_SYRINGE | INTRAMUSCULAR | Status: DC
  Administered 2023-07-21 – 2023-07-22 (×2): 40 mg via SUBCUTANEOUS
  Filled 2023-07-20 (×2): qty 0.4

## 2023-07-20 MED ORDER — ONDANSETRON HCL 4 MG/2ML IJ SOLN: 4.0000 mg | Freq: Four times a day (QID) | INTRAMUSCULAR | Status: DC | PRN

## 2023-07-20 MED ORDER — MORPHINE SULFATE (PF) 4 MG/ML IV SOLN
4.0000 mg | Freq: Once | INTRAVENOUS | Status: AC
  Administered 2023-07-20: 4 mg via INTRAVENOUS
  Filled 2023-07-20: qty 1

## 2023-07-20 MED ORDER — ACETAMINOPHEN 10 MG/ML IV SOLN
1000.0000 mg | Freq: Three times a day (TID) | INTRAVENOUS | Status: AC | PRN
  Administered 2023-07-21 (×3): 1000 mg via INTRAVENOUS
  Filled 2023-07-20 (×3): qty 100

## 2023-07-20 MED ORDER — FLUTICASONE FUROATE-VILANTEROL 200-25 MCG/ACT IN AEPB
1.0000 | INHALATION_SPRAY | Freq: Every day | RESPIRATORY_TRACT | Status: DC
  Administered 2023-07-21 – 2023-07-22 (×2): 1 via RESPIRATORY_TRACT
  Filled 2023-07-20 (×2): qty 28

## 2023-07-20 MED ORDER — LACTATED RINGERS IV SOLN: INTRAVENOUS | Status: AC

## 2023-07-20 MED ORDER — SODIUM CHLORIDE 0.9 % IV BOLUS
1000.0000 mL | Freq: Once | INTRAVENOUS | Status: AC
  Administered 2023-07-20: 1000 mL via INTRAVENOUS

## 2023-07-20 MED ORDER — KETOROLAC TROMETHAMINE 15 MG/ML IJ SOLN
15.0000 mg | Freq: Once | INTRAMUSCULAR | Status: AC
  Administered 2023-07-20: 15 mg via INTRAVENOUS
  Filled 2023-07-20: qty 1

## 2023-07-20 MED ORDER — ALBUTEROL SULFATE (2.5 MG/3ML) 0.083% IN NEBU
3.0000 mL | INHALATION_SOLUTION | RESPIRATORY_TRACT | Status: DC | PRN
  Administered 2023-07-21: 3 mL via RESPIRATORY_TRACT
  Filled 2023-07-20: qty 3

## 2023-07-20 MED ORDER — SODIUM CHLORIDE 0.9 % IV SOLN
2.0000 g | Freq: Once | INTRAVENOUS | Status: AC
  Administered 2023-07-20: 2 g via INTRAVENOUS
  Filled 2023-07-20: qty 20

## 2023-07-20 MED ORDER — SODIUM CHLORIDE 0.9 % IV SOLN
2.0000 g | INTRAVENOUS | Status: DC
  Administered 2023-07-21 – 2023-07-22 (×2): 2 g via INTRAVENOUS
  Filled 2023-07-20 (×2): qty 20

## 2023-07-20 MED ORDER — IOHEXOL 350 MG/ML SOLN
75.0000 mL | Freq: Once | INTRAVENOUS | Status: AC | PRN
  Administered 2023-07-20: 75 mL via INTRAVENOUS

## 2023-07-20 MED ORDER — FLUCONAZOLE 200 MG PO TABS
200.0000 mg | ORAL_TABLET | Freq: Once | ORAL | Status: AC
  Administered 2023-07-20: 200 mg via ORAL
  Filled 2023-07-20: qty 1

## 2023-07-20 MED ORDER — METHYLPREDNISOLONE SODIUM SUCC 125 MG IJ SOLR
125.0000 mg | Freq: Once | INTRAMUSCULAR | Status: AC
  Administered 2023-07-20: 125 mg via INTRAVENOUS
  Filled 2023-07-20: qty 2

## 2023-07-20 MED ORDER — RACEPINEPHRINE HCL 2.25 % IN NEBU: 0.5000 mL | INHALATION_SOLUTION | RESPIRATORY_TRACT | Status: DC

## 2023-07-20 MED ORDER — RACEPINEPHRINE HCL 2.25 % IN NEBU
0.5000 mL | INHALATION_SOLUTION | Freq: Once | RESPIRATORY_TRACT | Status: DC
Start: 2023-07-20 — End: 2023-07-20

## 2023-07-20 MED ORDER — SODIUM CHLORIDE 0.9% FLUSH
3.0000 mL | Freq: Two times a day (BID) | INTRAVENOUS | Status: DC
  Administered 2023-07-20 – 2023-07-22 (×4): 3 mL via INTRAVENOUS

## 2023-07-20 NOTE — ED Provider Notes (Signed)
 [place] [facility] Provider Note   CSN: [id number] Arrival date & time: 07/20/23  1351     History  Chief Complaint  Patient presents with   Sore Throat    Globus?     Carol Crawford is a 48 y.o. female.  48 year old female presents today for concern of sore throat.  She states she is able to tolerate drinking liquids however swallowing solids gives her some discomfort.  She feels like there is food stuck in her throat.  She states this started early in the morning after she woke up from sleep.  She recalls eating pizza for dinner last night with multiple toppings such as pineapples, olives, bacon, pepperoni.  This started yesterday.  Also would like an STI test.  States typically she will get checked every year but has not been recently checked.  Endorses some itching in the vaginal region.   The history is provided by the patient. No language interpreter was used.       Home Medications Prior to Admission medications   Medication Sig Start Date End Date Taking? Authorizing Provider  acetaminophen  (TYLENOL ) 500 MG tablet Take 500 mg by mouth every 6 (six) hours as needed for moderate pain or mild pain.    [provider]  albuterol  (VENTOLIN  HFA) 108 (90 Base) MCG/ACT inhaler Inhale 2 puffs into the lungs every 4 (four) hours as needed for wheezing or shortness of breath (cough). 11/11/22   Karamalegos, Kayleen Party, DO  Ascorbic Acid  (VITAMIN C PO) Take by mouth.    [provider]  busPIRone  (BUSPAR ) 5 MG tablet Take 1 tablet (5 mg total) by mouth 3 (three) times daily as needed (anxiety). 11/11/22   Karamalegos, Kayleen Party, DO  cyclobenzaprine  (FLEXERIL ) 5 MG tablet Take 1 tablet (5 mg total) by mouth 3 (three) times daily as needed for muscle spasms. 11/04/22   Gudena, Vinay, MD  diclofenac  Sodium (VOLTAREN ) 1 % GEL Apply 1 Application topically 2 (two) times daily. 10/31/21   Gudena, Vinay, MD  gabapentin  (NEURONTIN ) 300 MG  capsule Take 1 capsule (300 mg total) by mouth at bedtime. 11/04/22   Gudena, Vinay, MD  ibuprofen  (ADVIL ) 400 MG tablet Take 800 mg by mouth every 6 (six) hours as needed.    [provider]  nicotine  (NICODERM CQ ) 14 mg/24hr patch Place 1 patch (14 mg total) onto the skin daily. For 28 days 09/18/20   Raina Bunting, DO  QUEtiapine  (SEROQUEL ) 200 MG tablet Take 1 tablet (200 mg total) by mouth at bedtime. 11/11/22   Karamalegos, Kayleen Party, DO  SPIRIVA  RESPIMAT 2.5 MCG/ACT AERS Inhale 2 puffs into the lungs daily. 11/11/22   Karamalegos, Kayleen Party, DO  tamoxifen  (NOLVADEX ) 20 MG tablet Take 0.5 tablets (10 mg total) by mouth daily. 11/04/22   Gudena, Vinay, MD  traZODone  (DESYREL ) 50 MG tablet Take 1 tablet (50 mg total) by mouth at bedtime. 11/11/22   Karamalegos, Kayleen Party, DO      Allergies    Shrimp [shellfish allergy], Bee venom, Wasp venom, and Tomato    Review of Systems   Review of Systems  Constitutional:  Negative for chills and fever.  HENT:  Positive for sore throat and trouble swallowing. Negative for voice change.   Respiratory:  Negative for cough and shortness of breath.   Cardiovascular:  Negative for chest pain.  All other systems reviewed and are negative.   Physical Exam Updated Vital Signs BP (!) 129/98 (  BP Location: Left Arm)   Pulse 78   Temp 98 F (36.7 C) (Oral)   Resp 16   SpO2 100%  Physical Exam Vitals and nursing note reviewed.  Constitutional:      General: She is not in acute distress.    Appearance: Normal appearance. She is not ill-appearing.  HENT:     Head: Normocephalic and atraumatic.     Nose: Nose normal.     Mouth/Throat:     Mouth: Mucous membranes are moist.     Pharynx: No oropharyngeal exudate or posterior oropharyngeal erythema.  Eyes:     Conjunctiva/sclera: Conjunctivae normal.  Cardiovascular:     Rate and Rhythm: Normal rate.  Pulmonary:     Effort: Pulmonary effort is normal. No respiratory distress.   Musculoskeletal:        General: No deformity.  Skin:    Findings: No rash.  Neurological:     Mental Status: She is alert.     ED Results / Procedures / Treatments   Labs (all labs ordered are listed, but only abnormal results are displayed) Labs Reviewed  BASIC METABOLIC PANEL WITH GFR - Abnormal; Notable for the following components:      Result Value   CO2 20 (*)    All other components within normal limits  WET PREP, GENITAL  CBC WITH DIFFERENTIAL/PLATELET  URINALYSIS, ROUTINE W REFLEX MICROSCOPIC  RPR  GC/CHLAMYDIA PROBE AMP ([place]) NOT AT [facility]    EKG None  Radiology No results found.  Procedures .Critical Care  Performed by: Lucina Sabal, PA-C Authorized by: Lucina Sabal, PA-C   Critical care provider statement:    Critical care time (minutes):  50   Critical care was necessary to treat or prevent imminent or life-threatening deterioration of the following conditions: Epiglottitis.   Critical care was time spent personally by me on the following activities:  Development of treatment plan with patient or surrogate, discussions with consultants, evaluation of patient's response to treatment, examination of patient, ordering and review of laboratory studies, ordering and review of radiographic studies, ordering and performing treatments and interventions, pulse oximetry, re-evaluation of patient's condition and review of old charts   Care discussed with: admitting provider       Medications Ordered in ED Medications  ketorolac  (TORADOL ) 15 MG/ML injection 15 mg (15 mg Intravenous Given 07/20/23 1551)  sodium chloride  0.9 % bolus 1,000 mL (1,000 mLs Intravenous New Bag/Given 07/20/23 1554)    ED Course/ Medical Decision Making/ A&P                                 Medical Decision Making Amount and/or Complexity of Data Reviewed Labs: ordered. Radiology: ordered.  Risk Prescription drug management. Decision regarding hospitalization.   Medical  Decision Making / ED Course   This patient presents to the ED for concern of difficulty swallowing, this involves an extensive number of treatment options, and is a complaint that carries with it a high risk of complications and morbidity.  The differential diagnosis includes pharyngitis, peritonsillar abscess, retropharyngeal abscess, epiglottitis, food bolus impaction  MDM: 48 year old female presents today for concern of pharyngitis. Will obtain CT.  CT with concern of epiglottitis.  Discussed with Dr. Virgia Griffins.  Recommends Rocephin , antibiotics, and admission. CBC without leukocytosis or anemia.  BMP without acute concern.  UA without evidence of UTI. Discussed with intensivist. He states patient is without stridor and has been stable  in the emergency department for 6 hours without decompensating that she is stable for stepdown. Will discuss with hospitalist.  Wet prep positive for yeast.  Will give fluconazole .  Discussed with hospitalist will evaluate patient for admission.  Lab Tests: -I ordered, reviewed, and interpreted labs.   The pertinent results include:   Labs Reviewed  WET PREP, GENITAL - Abnormal; Notable for the following components:      Result Value   Clue Cells Wet Prep HPF POC PRESENT (*)    All other components within normal limits  BASIC METABOLIC PANEL WITH GFR - Abnormal; Notable for the following components:   CO2 20 (*)    All other components within normal limits  URINALYSIS, ROUTINE W REFLEX MICROSCOPIC - Abnormal; Notable for the following components:   APPearance HAZY (*)    Hgb urine dipstick SMALL (*)    Bacteria, UA RARE (*)    All other components within normal limits  CBC WITH DIFFERENTIAL/PLATELET  RPR  GC/CHLAMYDIA PROBE AMP () NOT AT [facility]      EKG  EKG Interpretation Date/Time:    Ventricular Rate:    PR Interval:    QRS Duration:    QT Interval:    QTC Calculation:   R Axis:      Text Interpretation:            Imaging Studies ordered: I ordered imaging studies including CT soft tissue neck I independently visualized and interpreted imaging. I agree with the radiologist interpretation   Medicines ordered and prescription drug management: Meds ordered this encounter  Medications   ketorolac  (TORADOL ) 15 MG/ML injection 15 mg   sodium chloride  0.9 % bolus 1,000 mL   iohexol  (OMNIPAQUE ) 350 MG/ML injection 75 mL   ondansetron  (ZOFRAN ) injection 4 mg   morphine  (PF) 4 MG/ML injection 4 mg   cefTRIAXone  (ROCEPHIN ) 2 g in sodium chloride  0.9 % 100 mL IVPB    Antibiotic Indication::   Other Indication (list below)    Other Indication::   Epiglottitis   methylPREDNISolone  sodium succinate (SOLU-MEDROL ) 125 mg/2 mL injection 125 mg    IV methylprednisolone  will be converted to either a q12h or q24h frequency with the same total daily dose (TDD).  Ordered Dose: 1 to 125 mg TDD; convert to: TDD q24h.  Ordered Dose: 126 to 250 mg TDD; convert to: TDD div q12h.  Ordered Dose: >250 mg TDD; DAW.   fluconazole  (DIFLUCAN ) tablet 200 mg    -I have reviewed the patients home medicines and have made adjustments as needed  Critical interventions IV antibiotics, steroids, monitoring  Consultations Obtained: I requested consultation with the ENT,  and discussed lab and imaging findings as well as pertinent plan - they recommend: As above   Reevaluation: After the interventions noted above, I reevaluated the patient and found that they have :stayed the same  Co morbidities that complicate the patient evaluation  Past Medical History:  Diagnosis Date   ADHD    Bipolar 1 disorder (HCC)    Breast cancer (HCC)    currently in treatment for   Breast disorder    right breast cancer   COPD (chronic obstructive pulmonary disease) (HCC)    Depressed    Drug overdose 2010   Family history of breast cancer    Family history of colon cancer    Family history of prostate cancer    GERD  (gastroesophageal reflux disease)    OCC   Headache    MIGRAINES (None  for 2 months)   History of MRSA infection    foot infection   Personal history of radiation therapy 01/28/2019   right breast   Schizophrenia (HCC)    Stroke [facility])    after foot surgery, left side was numb, fully recovered      Dispostion: Discussed with hospitalist who will evaluate patient for admission.   Final Clinical Impression(s) / ED Diagnoses Final diagnoses:  Epiglottitis    Rx / DC Orders ED Discharge Orders     None         Lucina Sabal, PA-C 07/20/23 2142    Afton Horse T, DO 07/29/23 [phone]

## 2023-07-20 NOTE — H&P (Signed)
 History and Physical    Carol Crawford:[id number] DOB: [birth year] DOA: 07/20/2023  PCP: Raina Bunting, DO   Patient coming from: Home   Chief Complaint:  Chief Complaint  Patient presents with   Sore Throat    Globus?    ED TRIAGE note:Pt states that on Saturday she woke up with a feeling of having something stuck in her throat. Pt denies hx of esophagus issues. Able to tolerate liquid PO yesterday, but states that foods irritate and worsen the feeling.    Pt also requesting STD check. Pt states she was exposed to syphillis, received full course of abx but would like additional testing. Pt endorses vaginal odor, itching, and painful urination.   HPI:  Carol Crawford is a 48 y.o. female with medical history significant of history of anxiety, bipolar disorder, major depressive disorder, schizophrenia, ADHD, breast cancer status post lumpectomy and radiation on tamoxifen , CVA, syphilis and STD presenting to emergency department multiple complaining of vaginal discharge, itching, painful urination and   feeling sensation of food bolus stuck in the throat with irritation requesting for STD check. Patient has history of penicillin  allergy.  Last hospitalization in June 2022 for concern for development of Stevens-Johnson syndrome supposed to be transferred to [facility] for punch biopsy but due to bed unavailability she has been staying and treated here and further workup revealed secondary syphilis.  History of penicillin  allergy she has been desensitized and treated with benzathine penicillin .  Patient is hesitant to give more information regarding sexual partners including number and their sex however reported she is practicing oral sex.  For last few days she has been noticing worsening vaginal discharge with water and dysuria.  Denies any vaginal bleeding.  Also noticed sore throat and for last 24 hours having worsening sensation of throat closing/unable to swallow solid food.  Denies  any rash or itchiness, fever, chill, abdomen pain nausea and vomiting.   ED Course:  At presentation to ED patient is hemodynamically stable. CBC evidence of leukocytosis. BMP showing low bicarb 20 otherwise unremarkable. UA bacteria positive otherwise normal WBC, leukocyte esterase and nitrate negative. Wet prep is clue cells present. Pain in chlamydia and gonorrhea probe. Pending RPR.   CTA soft tissue neck showed: 1. Mild swelling/edema about the uvula, soft palate, and epiglottis, suggestive of supraglottic airway remains patent at this time. No discrete abscess or drainable fluid collection. 2. Moderate spondylosis at C5-6 and C6-7.  ED physician spoke with ENT Dr. Virgia Griffins.  Per discussion with ENT given patient is hemodynamically stable no evidence of stridor recommended to treat with IV steroid and antibiotic.  ED physician also spoke with ICU physician Dr. Mason Sole given patient does not have any stridor and wheezing it is appropriate to admit this patient to stepdown unit.  In the ED patient has been treated with IV ceftriaxone  and Solu-Medrol .  Hospitalist has been consulted for further evaluation management of epiglottitis and bacterial vaginosis.    Significant labs in the ED: Lab Orders         Wet prep, genital         Culture, blood (Routine X 2) w Reflex to ID Panel         CBC with Differential         Basic metabolic panel         Urinalysis, Routine w reflex microscopic -Urine, Clean Catch         RPR         HIV  Antibody (routine testing w rflx)         Hepatitis panel, acute         Comprehensive metabolic panel         CBC       Review of Systems:  Review of Systems  Constitutional:  Negative for chills, fever and weight loss.  HENT:  Positive for sore throat.   Respiratory:  Negative for cough, sputum production, shortness of breath, wheezing and stridor.   Cardiovascular:  Negative for chest pain.  Gastrointestinal:  Negative for vomiting.   Genitourinary:  Positive for dysuria. Negative for flank pain, frequency, hematuria and urgency.       Vaginal discharge and odour  Musculoskeletal:  Negative for back pain, joint pain, myalgias and neck pain.  Neurological:  Negative for dizziness and headaches.  Psychiatric/Behavioral:  The patient is not nervous/anxious.     Past Medical History:  Diagnosis Date   ADHD    Bipolar 1 disorder (HCC)    Breast cancer (HCC)    currently in treatment for   Breast disorder    right breast cancer   COPD (chronic obstructive pulmonary disease) (HCC)    Depressed    Drug overdose 2010   Family history of breast cancer    Family history of colon cancer    Family history of prostate cancer    GERD (gastroesophageal reflux disease)    OCC   Headache    MIGRAINES (None for 2 months)   History of MRSA infection    foot infection   Personal history of radiation therapy 01/28/2019   right breast   Schizophrenia (HCC)    Stroke (HCC)    after foot surgery, left side was numb, fully recovered    Past Surgical History:  Procedure Laterality Date   BREAST BIOPSY Right 10/21/2018   Affirm Bx-"X" clip-path pending   BREAST LUMPECTOMY Right 12/02/2018   malignant   BREAST LUMPECTOMY WITH RADIOACTIVE SEED AND SENTINEL LYMPH NODE BIOPSY Right 12/02/2018   Procedure: RIGHT BREAST LUMPECTOMY WITH RADIOACTIVE SEED AND SENTINEL LYMPH NODE BIOPSY;  Surgeon: Caralyn Chandler, MD;  Location: [facility];  Service: General;  Laterality: Right;   INCISION AND DRAINAGE Left 01/09/2018   Procedure: INCISION AND DRAINAGE-below fascia foot; multiple;  Surgeon: Anell Baptist, DPM;  Location: [facility];  Service: Podiatry;  Laterality: Left;   INCISION AND DRAINAGE Left 04/17/2018   Procedure: I&D; BELOW FASCIA FOOT; MULTIPLE;  Surgeon: Anell Baptist, DPM;  Location: [facility];  Service: Podiatry;  Laterality: Left;   INCISION AND DRAINAGE Right 12/18/2018   Procedure: PLACEMENT OF DRAIN IN RIGHT  AXILLA;  Surgeon: Caralyn Chandler, MD;  Location: [facility] OR;  Service: General;  Laterality: Right;   INCISION AND DRAINAGE Left 04/12/2020   Procedure: INCISION AND DRAINAGE BELOW FASCIA FOOT, SINGLE LEFT;  Surgeon: Anell Baptist, DPM;  Location: [facility];  Service: Podiatry;  Laterality: Left;  leave first for transportation   REPAIR EXTENSOR TENDON Left 09/05/2017   Procedure: REPAIR EXTENSOR TENDON;  Surgeon: Anell Baptist, DPM;  Location: [facility];  Service: Podiatry;  Laterality: Left;     reports that she has been smoking cigarettes. She has a 7.5 pack-year smoking history. She has never used smokeless tobacco. She reports current alcohol use of about 2.0 standard drinks of alcohol per week. She reports current drug use. Drug: Marijuana.  Allergies  Allergen Reactions   Clindamycin /Lincomycin Rash   Shrimp [Shellfish Allergy] Hives   Bee  Venom Swelling   Wasp Venom Swelling   Penicillins Hives and Other (See Comments)    Has patient had a PCN reaction causing immediate rash, facial/tongue/throat swelling, SOB or lightheadedness with hypotension: No Has patient had a PCN reaction causing severe rash involving mucus membranes or skin necrosis: Yes Has patient had a PCN reaction that required hospitalization: No Has patient had a PCN reaction occurring within the last 10 years: Yes If all of the above answers are "NO", then may proceed with Cephalosporin use.    Tomato Rash    Family History  Problem Relation Age of Onset   Arthritis Mother    Hypertension Mother    Cervical cancer Mother    Prostate cancer Father    Lung cancer Father    Diabetes Paternal Aunt    Breast cancer Paternal Aunt    ADD / ADHD Daughter    Bipolar disorder Daughter    Hypertension Maternal Grandmother    Stroke Maternal Grandmother    Prostate cancer Maternal Grandfather    Hypertension Maternal Grandfather    Colon cancer Maternal Grandfather    Hypertension Paternal Grandfather     Stroke Paternal Grandfather    Breast cancer Cousin     Prior to Admission medications   Medication Sig Start Date End Date Taking? Authorizing Provider  acetaminophen  (TYLENOL ) 500 MG tablet Take 500 mg by mouth every 6 (six) hours as needed for moderate pain or mild pain.    [provider]  albuterol  (VENTOLIN  HFA) 108 (90 Base) MCG/ACT inhaler Inhale 2 puffs into the lungs every 4 (four) hours as needed for wheezing or shortness of breath (cough). 11/11/22   Karamalegos, Kayleen Party, DO  Ascorbic Acid  (VITAMIN C PO) Take by mouth.    [provider]  busPIRone  (BUSPAR ) 5 MG tablet Take 1 tablet (5 mg total) by mouth 3 (three) times daily as needed (anxiety). 11/11/22   Karamalegos, Kayleen Party, DO  cyclobenzaprine  (FLEXERIL ) 5 MG tablet Take 1 tablet (5 mg total) by mouth 3 (three) times daily as needed for muscle spasms. 11/04/22   Gudena, Vinay, MD  diclofenac  Sodium (VOLTAREN ) 1 % GEL Apply 1 Application topically 2 (two) times daily. 10/31/21   Gudena, Vinay, MD  gabapentin  (NEURONTIN ) 300 MG capsule Take 1 capsule (300 mg total) by mouth at bedtime. 11/04/22   Gudena, Vinay, MD  ibuprofen  (ADVIL ) 400 MG tablet Take 800 mg by mouth every 6 (six) hours as needed.    [provider]  nicotine  (NICODERM CQ ) 14 mg/24hr patch Place 1 patch (14 mg total) onto the skin daily. For 28 days 09/18/20   Raina Bunting, DO  QUEtiapine  (SEROQUEL ) 200 MG tablet Take 1 tablet (200 mg total) by mouth at bedtime. 11/11/22   Karamalegos, Kayleen Party, DO  SPIRIVA  RESPIMAT 2.5 MCG/ACT AERS Inhale 2 puffs into the lungs daily. 11/11/22   Karamalegos, Kayleen Party, DO  tamoxifen  (NOLVADEX ) 20 MG tablet Take 0.5 tablets (10 mg total) by mouth daily. 11/04/22   Gudena, Vinay, MD  traZODone  (DESYREL ) 50 MG tablet Take 1 tablet (50 mg total) by mouth at bedtime. 11/11/22   Raina Bunting, DO     Physical Exam: Vitals:   07/20/23 1959 07/20/23 2000 07/20/23 2100 07/20/23 2220   BP: 126/89 (!) 153/99 (!) 134/94 (!) 154/71  Pulse: 65 64 (!) 58 60  Resp: 16  16 15   Temp: 98.5 F (36.9 C)   98.5 F (36.9 C)  TempSrc: Oral  Oral  SpO2: 100% 100% 100% 100%  Weight:    84.2 kg    Physical Exam Vitals and nursing note reviewed.  Constitutional:      General: She is not in acute distress.    Appearance: She is not toxic-appearing.  HENT:     Mouth/Throat:     Mouth: Mucous membranes are moist. No oral lesions.     Pharynx: Posterior oropharyngeal erythema and uvula swelling present. No pharyngeal swelling or oropharyngeal exudate.  Eyes:     Pupils: Pupils are equal, round, and reactive to light.  Cardiovascular:     Rate and Rhythm: Normal rate and regular rhythm.     Heart sounds: Normal heart sounds.  Pulmonary:     Effort: Pulmonary effort is normal. No respiratory distress.     Breath sounds: Normal breath sounds. No stridor. No wheezing, rhonchi or rales.  Chest:     Chest wall: No tenderness.  Musculoskeletal:     Cervical back: Neck supple.  Lymphadenopathy:     Cervical: No cervical adenopathy.  Skin:    Capillary Refill: Capillary refill takes less than 2 seconds.  Neurological:     Mental Status: She is alert and oriented to person, place, and time.  Psychiatric:        Mood and Affect: Mood normal.        Behavior: Behavior normal.      Labs on Admission: I have personally reviewed following labs and imaging studies  CBC: Recent Labs  Lab 07/20/23 1558  WBC 6.8  NEUTROABS 2.9  HGB 13.4  HCT 38.6  MCV 95.5  PLT 243   Basic Metabolic Panel: Recent Labs  Lab 07/20/23 1558  NA 135  K 3.6  CL 106  CO2 20*  GLUCOSE 99  BUN 8  CREATININE 0.85  CALCIUM 8.9   GFR: CrCl cannot be calculated (Unknown ideal weight.). Liver Function Tests: No results for input(s): "AST", "ALT", "ALKPHOS", "BILITOT", "PROT", "ALBUMIN" in the last 168 hours. No results for input(s): "LIPASE", "AMYLASE" in the last 168 hours. No results for  input(s): "AMMONIA" in the last 168 hours. Coagulation Profile: No results for input(s): "INR", "PROTIME" in the last 168 hours. Cardiac Enzymes: No results for input(s): "CKTOTAL", "CKMB", "CKMBINDEX", "TROPONINI", "TROPONINIHS" in the last 168 hours. BNP (last 3 results) No results for input(s): "BNP" in the last 8760 hours. HbA1C: No results for input(s): "HGBA1C" in the last 72 hours. CBG: No results for input(s): "GLUCAP" in the last 168 hours. Lipid Profile: No results for input(s): "CHOL", "HDL", "LDLCALC", "TRIG", "CHOLHDL", "LDLDIRECT" in the last 72 hours. Thyroid Function Tests: No results for input(s): "TSH", "T4TOTAL", "FREET4", "T3FREE", "THYROIDAB" in the last 72 hours. Anemia Panel: No results for input(s): "VITAMINB12", "FOLATE", "FERRITIN", "TIBC", "IRON", "RETICCTPCT" in the last 72 hours. Urine analysis:    Component Value Date/Time   COLORURINE YELLOW 07/20/2023 1704   APPEARANCEUR HAZY (A) 07/20/2023 1704   APPEARANCEUR Clear 11/09/2012 2022   LABSPEC 1.010 07/20/2023 1704   LABSPEC 1.006 11/09/2012 2022   PHURINE 5.0 07/20/2023 1704   GLUCOSEU NEGATIVE 07/20/2023 1704   GLUCOSEU Negative 11/09/2012 2022   HGBUR SMALL (A) 07/20/2023 1704   BILIRUBINUR NEGATIVE 07/20/2023 1704   BILIRUBINUR Negative 11/09/2012 2022   KETONESUR NEGATIVE 07/20/2023 1704   PROTEINUR NEGATIVE 07/20/2023 1704   NITRITE NEGATIVE 07/20/2023 1704   LEUKOCYTESUR NEGATIVE 07/20/2023 1704   LEUKOCYTESUR Negative 11/09/2012 2022    Radiological Exams on Admission: I have personally reviewed images CT  Soft Tissue Neck W Contrast Result Date: 07/20/2023 CLINICAL DATA:  Initial evaluation for acute soft tissue infection suspected. EXAM: CT NECK WITH CONTRAST TECHNIQUE: Multidetector CT imaging of the neck was performed using the standard protocol following the bolus administration of intravenous contrast. RADIATION DOSE REDUCTION: This exam was performed according to the departmental  dose-optimization program which includes automated exposure control, adjustment of the mA and/or kV according to patient size and/or use of iterative reconstruction technique. CONTRAST:  75mL OMNIPAQUE  IOHEXOL  350 MG/ML SOLN COMPARISON:  None Available. FINDINGS: Pharynx and larynx: Oral cavity within normal limits. Palatine tonsils within normal limits. Adenoidal soft tissues are prominent, protruding into the nasopharynx. Uvula and soft palate appear mildly edematous and swollen. Epiglottis is mildly edematous in appearance. Findings suggestive of acute supraglottitis. Trace reactive retropharyngeal effusion without frank retropharyngeal abscess. Small volume layering secretions noted within the hypopharynx. Supraglottic airway remains patent at this time. Glottis is closed but grossly symmetric and within normal limits. Subglottic airway clear. Salivary glands: Salivary glands including the parotid and submandibular glands are within normal limits. Thyroid: Normal. Lymph nodes: No enlarged or pathologic lymph nodes within the neck. Vascular: Normal vascular enhancement seen within the neck. Limited intracranial: Unremarkable. Visualized orbits: Unremarkable. Mastoids and visualized paranasal sinuses: Left maxillary sinus retention cyst noted. Paranasal sinuses are otherwise clear. Mastoid air cells and middle ear cavities are well pneumatized and free of fluid. Skeleton: No discrete or worrisome osseous lesions. Moderate spondylosis present at C5-6 and C6-7. Upper chest: No other acute finding. Other: None. IMPRESSION: 1. Mild swelling/edema about the uvula, soft palate, and epiglottis, suggestive of acute supraglottitis. Supraglottic airway remains patent at this time. No discrete abscess or drainable fluid collection. 2. Moderate spondylosis at C5-6 and C6-7. Electronically Signed   By: Virgia Griffins M.D.   On: 07/20/2023 18:49       Assessment/Plan: Principal Problem:   Acute  epiglottitis Active Problems:   History of secondary syphilis of skin   High risk sexual behavior   Bipolar 1 disorder (HCC) with SA 2020 (OD pain pills)   Schizophrenia (HCC)   GAD (generalized anxiety disorder)   ADHD   History of breast cancer   History of CVA (cerebrovascular accident)   Reactive airway disease   Bacterial vaginosis    Assessment and Plan: Acute epiglottitis -Patient presented with complaining of starting a food bolus in the throat which started for last 24 hours.  Able to swallow liquid food but no solid. - At presentation to ED is hemodynamically stable.  O2 sat 100% room air. -CBC and BMP unremarkable. -CT soft tissue neck showed  Mild swelling/edema about the uvula, soft palate, and epiglottis, suggestive of acute supraglottitis. Supraglottic airway remains patent at this time. No discrete abscess or drainable fluid collection. -ED provider discussed case with ENT Dr. Virgia Griffins and ICU physician Dr. Mason Sole both of them recommended given patient is hemodynamically stable, no evidence of stridor at this moment can treat with IV steroid and IV antibiotic.  Patient can be managed at progressive/stepdown unit. - In ED patient has been received IV Solu-Medrol  and ceftriaxone  2 g. - Plan to continue IV Solu-Medrol  40 mg daily for 4 days, continue IV ceftriaxone  2 g daily. - Giving racepinephrine nebulizer once.  And continue as needed for management of stridor, wheezing and shortness of breath.  Need to inform MD if patient develops stridor. -Starting Dulera twice daily and continue albuterol  as needed for wheezing shortness of breath. - Keeping patient n.p.o. as high  risk of aspiration. -Continue pulse ox, supplemental oxygen as needed to keep O2 sat above 96% and continue cardiac monitoring. -Appreciate ENT and ICU input.    Bacterial vaginosis High risk sexual behavior History of secondary syphilis - Patient has episode of a Stevens-Johnson syndrome related to  clindamycin  use and secondary syphilis in June 2022.  She has been treated with IV benzathine penicillin  G after desensitization. -Currently patient does not have any rash however complaining about vaginal discharge, will order and itchiness. - Patient is afebrile.  CBC and BMP unremarkable. -Wet prep showed clue cells positive. -Pending RPR, chlamydia gonorrhea probe, HIV and hepatitis panel.  If RPR titer is high in that case need to reach out to infectious disease for further recommendation of treatment. - For treatment of bacterial vaginosis starting IV metronidazole 500 mg twice daily for 7 days.   History of reactive airway disease -Starting Dulera twice daily and continue albuterol  as needed.  Schizophrenia Bipolar disorder type I Generalized anxiety disorder ADHD -Per chart review currently on not any medication at home  History of breast cancer -Holding tamoxifen  as waiting for pharmacy verification for med rec.  Also patient supposed to be n.p.o.  History of CVA -Currently not on any antiplatelet therapy at home   DVT prophylaxis:  Lovenox  Code Status:  Full Code Diet: N.p.o. as high risk for aspiration Family Communication:   Family was present at bedside, at the time of interview. Opportunity was given to ask question and all questions were answered satisfactorily.  Disposition Plan: Continue to monitor respiratory status very closely. Consults: ENT and ICU physician Admission status:   Inpatient, Step Down Unit  Severity of Illness: The appropriate patient status for this patient is INPATIENT. Inpatient status is judged to be reasonable and necessary in order to provide the required intensity of service to ensure the patient's safety. The patient's presenting symptoms, physical exam findings, and initial radiographic and laboratory data in the context of their chronic comorbidities is felt to place them at high risk for further clinical deterioration. Furthermore, it is  not anticipated that the patient will be medically stable for discharge from the hospital within 2 midnights of admission.   * I certify that at the point of admission it is my clinical judgment that the patient will require inpatient hospital care spanning beyond 2 midnights from the point of admission due to high intensity of service, high risk for further deterioration and high frequency of surveillance required.Aaron Aas    Maximilliano Kersh, MD [facility]  How to contact the [facility] Attending or Consulting provider 7A - 7P or covering provider during after hours 7P -7A, for this patient.  Check the care team in [facility] and look for a) attending/consulting [facility] provider listed and b) the [facility] team listed Log into [email or web address] and use [facility] universal password to access. If you do not have the password, please contact the hospital operator. Locate the [facility] provider you are looking for under [facility] and page to a number that you can be directly reached. If you still have difficulty reaching the provider, please page the [facility] (Director on Call) for the Hospitalists listed on amion for assistance.  07/20/2023, 11:23 PM

## 2023-07-20 NOTE — Consult Note (Signed)
 Reason for Consult:supraglottis Referring Physician: [name] is an 48 y.o. female.  HPI: hx of 1 day of sore throat and difficulty swallowing. She has no breathing issues. She has not had any new meds or food. She has not had this previous. Thinks her voice is slightly changed but not significant.   Past Medical History:  Diagnosis Date   ADHD    Bipolar 1 disorder (HCC)    Breast cancer (HCC)    currently in treatment for   Breast disorder    right breast cancer   COPD (chronic obstructive pulmonary disease) (HCC)    Depressed    Drug overdose 2010   Family history of breast cancer    Family history of colon cancer    Family history of prostate cancer    GERD (gastroesophageal reflux disease)    OCC   Headache    MIGRAINES (None for 2 months)   History of MRSA infection    foot infection   Personal history of radiation therapy 01/28/2019   right breast   Schizophrenia (HCC)    Stroke (HCC)    after foot surgery, left side was numb, fully recovered    Past Surgical History:  Procedure Laterality Date   BREAST BIOPSY Right 10/21/2018   Affirm Bx-"X" clip-path pending   BREAST LUMPECTOMY Right 12/02/2018   malignant   BREAST LUMPECTOMY WITH RADIOACTIVE SEED AND SENTINEL LYMPH NODE BIOPSY Right 12/02/2018   Procedure: RIGHT BREAST LUMPECTOMY WITH RADIOACTIVE SEED AND SENTINEL LYMPH NODE BIOPSY;  Surgeon: Caralyn Chandler, MD;  Location: [facility];  Service: General;  Laterality: Right;   INCISION AND DRAINAGE Left 01/09/2018   Procedure: INCISION AND DRAINAGE-below fascia foot; multiple;  Surgeon: Anell Baptist, DPM;  Location: [facility];  Service: Podiatry;  Laterality: Left;   INCISION AND DRAINAGE Left 04/17/2018   Procedure: I&D; BELOW FASCIA FOOT; MULTIPLE;  Surgeon: Anell Baptist, DPM;  Location: [facility];  Service: Podiatry;  Laterality: Left;   INCISION AND DRAINAGE Right 12/18/2018   Procedure: PLACEMENT OF DRAIN IN RIGHT AXILLA;   Surgeon: Caralyn Chandler, MD;  Location: [facility] OR;  Service: General;  Laterality: Right;   INCISION AND DRAINAGE Left 04/12/2020   Procedure: INCISION AND DRAINAGE BELOW FASCIA FOOT, SINGLE LEFT;  Surgeon: Anell Baptist, DPM;  Location: [facility];  Service: Podiatry;  Laterality: Left;  leave first for transportation   REPAIR EXTENSOR TENDON Left 09/05/2017   Procedure: REPAIR EXTENSOR TENDON;  Surgeon: Anell Baptist, DPM;  Location: [facility];  Service: Podiatry;  Laterality: Left;    Family History  Problem Relation Age of Onset   Arthritis Mother    Hypertension Mother    Cervical cancer Mother    Prostate cancer Father    Lung cancer Father    Diabetes Paternal Aunt    Breast cancer Paternal Aunt    ADD / ADHD Daughter    Bipolar disorder Daughter    Hypertension Maternal Grandmother    Stroke Maternal Grandmother    Prostate cancer Maternal Grandfather    Hypertension Maternal Grandfather    Colon cancer Maternal Grandfather    Hypertension Paternal Grandfather    Stroke Paternal Grandfather    Breast cancer Cousin     Social History:  reports that she has been smoking cigarettes. She has a 7.5 pack-year smoking history. She has never used smokeless tobacco. She reports current alcohol use of about 2.0 standard drinks of alcohol per week. She reports  current drug use. Drug: Marijuana.  Allergies:  Allergies  Allergen Reactions   Shrimp [Shellfish Allergy] Hives   Bee Venom Swelling   Wasp Venom Swelling   Tomato Rash    Medications: I have reviewed the patient's current medications.  Results for orders placed or performed during the hospital encounter of 07/20/23 (from the past 48 hours)  CBC with Differential     Status: None   Collection Time: 07/20/23  3:58 PM  Result Value Ref Range   WBC 6.8 4.0 - 10.5 K/uL   RBC 4.04 3.87 - 5.11 MIL/uL   Hemoglobin 13.4 12.0 - 15.0 g/dL   HCT 40.9 81.1 - 91.4 %   MCV 95.5 80.0 - 100.0 fL   MCH 33.2 26.0 - 34.0 pg    MCHC 34.7 30.0 - 36.0 g/dL   RDW 78.2 95.6 - 21.3 %   Platelets 243 150 - 400 K/uL   nRBC 0.0 0.0 - 0.2 %   Neutrophils Relative % 44 %   Neutro Abs 2.9 1.7 - 7.7 K/uL   Lymphocytes Relative 49 %   Lymphs Abs 3.3 0.7 - 4.0 K/uL   Monocytes Relative 7 %   Monocytes Absolute 0.5 0.1 - 1.0 K/uL   Eosinophils Relative 0 %   Eosinophils Absolute 0.0 0.0 - 0.5 K/uL   Basophils Relative 0 %   Basophils Absolute 0.0 0.0 - 0.1 K/uL   Immature Granulocytes 0 %   Abs Immature Granulocytes 0.01 0.00 - 0.07 K/uL    Comment: Performed at [facility], [address], [place], [place] [address]  Basic metabolic panel     Status: Abnormal   Collection Time: 07/20/23  3:58 PM  Result Value Ref Range   Sodium 135 135 - 145 mmol/L   Potassium 3.6 3.5 - 5.1 mmol/L   Chloride 106 98 - 111 mmol/L   CO2 20 (L) 22 - 32 mmol/L   Glucose, Bld 99 70 - 99 mg/dL    Comment: Glucose reference range applies only to samples taken after fasting for at least 8 hours.   BUN 8 6 - 20 mg/dL   Creatinine, Ser 8.46 0.44 - 1.00 mg/dL   Calcium 8.9 8.9 - 96.2 mg/dL   GFR, Estimated >95 >28 mL/min    Comment: (NOTE) Calculated using the CKD-EPI Creatinine Equation (2021)    Anion gap 9 5 - 15    Comment: Performed at [facility], [address], [place], [place] [address]  Urinalysis, Routine w reflex microscopic -Urine, Clean Catch     Status: Abnormal   Collection Time: 07/20/23  5:04 PM  Result Value Ref Range   Color, Urine YELLOW YELLOW   APPearance HAZY (A) CLEAR   Specific Gravity, Urine 1.010 1.005 - 1.030   pH 5.0 5.0 - 8.0   Glucose, UA NEGATIVE NEGATIVE mg/dL   Hgb urine dipstick SMALL (A) NEGATIVE   Bilirubin Urine NEGATIVE NEGATIVE   Ketones, ur NEGATIVE NEGATIVE mg/dL   Protein, ur NEGATIVE NEGATIVE mg/dL   Nitrite NEGATIVE NEGATIVE   Leukocytes,Ua NEGATIVE NEGATIVE   RBC / HPF 0-5 0 - 5 RBC/hpf   WBC, UA 0-5 0 - 5 WBC/hpf   Bacteria, UA RARE (A) NONE SEEN   Squamous Epithelial /  HPF 11-20 0 - 5 /HPF   Mucus PRESENT     Comment: Performed at [facility], [address], [place], [place] [address]  Wet prep, genital     Status: Abnormal  Collection Time: 07/20/23  5:05 PM   Specimen: PATH Cytology Ancillary Only  Result Value Ref Range   Yeast Wet Prep HPF POC NONE SEEN NONE SEEN   Trich, Wet Prep NONE SEEN NONE SEEN   Clue Cells Wet Prep HPF POC PRESENT (A) NONE SEEN   WBC, Wet Prep HPF POC <10 <10   Sperm NONE SEEN     Comment: Performed at [facility], [address] [place], [place] [address]    CT Soft Tissue Neck W Contrast Result Date: 07/20/2023 CLINICAL DATA:  Initial evaluation for acute soft tissue infection suspected. EXAM: CT NECK WITH CONTRAST TECHNIQUE: Multidetector CT imaging of the neck was performed using the standard protocol following the bolus administration of intravenous contrast. RADIATION DOSE REDUCTION: This exam was performed according to the departmental dose-optimization program which includes automated exposure control, adjustment of the mA and/or kV according to patient size and/or use of iterative reconstruction technique. CONTRAST:  75mL OMNIPAQUE  IOHEXOL  350 MG/ML SOLN COMPARISON:  None Available. FINDINGS: Pharynx and larynx: Oral cavity within normal limits. Palatine tonsils within normal limits. Adenoidal soft tissues are prominent, protruding into the nasopharynx. Uvula and soft palate appear mildly edematous and swollen. Epiglottis is mildly edematous in appearance. Findings suggestive of acute supraglottitis. Trace reactive retropharyngeal effusion without frank retropharyngeal abscess. Small volume layering secretions noted within the hypopharynx. Supraglottic airway remains patent at this time. Glottis is closed but grossly symmetric and within normal limits. Subglottic airway clear. Salivary glands: Salivary glands including the parotid and submandibular glands are within normal limits. Thyroid: Normal. Lymph nodes:  No enlarged or pathologic lymph nodes within the neck. Vascular: Normal vascular enhancement seen within the neck. Limited intracranial: Unremarkable. Visualized orbits: Unremarkable. Mastoids and visualized paranasal sinuses: Left maxillary sinus retention cyst noted. Paranasal sinuses are otherwise clear. Mastoid air cells and middle ear cavities are well pneumatized and free of fluid. Skeleton: No discrete or worrisome osseous lesions. Moderate spondylosis present at C5-6 and C6-7. Upper chest: No other acute finding. Other: None. IMPRESSION: 1. Mild swelling/edema about the uvula, soft palate, and epiglottis, suggestive of acute supraglottitis. Supraglottic airway remains patent at this time. No discrete abscess or drainable fluid collection. 2. Moderate spondylosis at C5-6 and C6-7. Electronically Signed   By: Virgia Griffins M.D.   On: 07/20/2023 18:49    ROS Blood pressure (!) 153/99, pulse 64, temperature 98.5 F (36.9 C), temperature source Oral, resp. rate 16, SpO2 100%. Physical Exam Constitutional:      Appearance: She is well-developed.     Comments: She is sitting up talking on phone and smiling.   HENT:     Head: Normocephalic and atraumatic.     Right Ear: Ear canal normal.     Left Ear: Ear canal normal.     Mouth/Throat:     Comments: No swelling. No stridor. She is completely comfortable and no distress, voice does not sound hoarse. Opens mouth normal and the uvula or any other part of the OP does not look swollen. Tongue normal Eyes:     Conjunctiva/sclera: Conjunctivae normal.  Musculoskeletal:     Cervical back: Normal range of motion.  Neurological:     Mental Status: She is alert.       Assessment/Plan: Supraglottis- she does not look like she has any distress and not having any voice change or breathing issues that are noted. CT scan shows some swelling of the epiglottis so overnight stay would be recommended. I discussed a FOE  with her but given her  minimal  clinical symptoms I don't think it is necessary tonight.   Carol Crawford 07/20/2023, 9:21 PM

## 2023-07-20 NOTE — ED Triage Notes (Signed)
 Pt states that on Saturday she woke up with a feeling of having something stuck in her throat. Pt denies hx of esophagus issues. Able to tolerate liquid PO yesterday, but states that foods irritate and worsen the feeling.   Pt also requesting STD check. Pt states she was exposed to syphillis, received full course of abx but would like additional testing. Pt endorses vaginal odor, itching, and painful urination.

## 2023-07-21 ENCOUNTER — Other Ambulatory Visit (HOSPITAL_COMMUNITY): Payer: Self-pay

## 2023-07-21 ENCOUNTER — Telehealth (HOSPITAL_COMMUNITY): Payer: Self-pay

## 2023-07-21 DIAGNOSIS — F209 Schizophrenia, unspecified: Secondary | ICD-10-CM

## 2023-07-21 DIAGNOSIS — Z8619 Personal history of other infectious and parasitic diseases: Secondary | ICD-10-CM | POA: Diagnosis not present

## 2023-07-21 DIAGNOSIS — N760 Acute vaginitis: Secondary | ICD-10-CM | POA: Diagnosis not present

## 2023-07-21 DIAGNOSIS — Z853 Personal history of malignant neoplasm of breast: Secondary | ICD-10-CM

## 2023-07-21 DIAGNOSIS — J4520 Mild intermittent asthma, uncomplicated: Secondary | ICD-10-CM | POA: Diagnosis not present

## 2023-07-21 DIAGNOSIS — J0510 Acute epiglottitis without obstruction: Secondary | ICD-10-CM | POA: Diagnosis not present

## 2023-07-21 LAB — COMPREHENSIVE METABOLIC PANEL WITH GFR
ALT: 20 U/L (ref 0–44)
AST: 17 U/L (ref 15–41)
Albumin: 3.5 g/dL (ref 3.5–5.0)
Alkaline Phosphatase: 52 U/L (ref 38–126)
Anion gap: 9 (ref 5–15)
BUN: 5 mg/dL — ABNORMAL LOW (ref 6–20)
CO2: 21 mmol/L — ABNORMAL LOW (ref 22–32)
Calcium: 8.5 mg/dL — ABNORMAL LOW (ref 8.9–10.3)
Chloride: 108 mmol/L (ref 98–111)
Creatinine, Ser: 0.95 mg/dL (ref 0.44–1.00)
GFR, Estimated: >60 mL/min (ref >60)
Glucose, Bld: 174 mg/dL — ABNORMAL HIGH (ref 70–99)
Potassium: 3.9 mmol/L (ref 3.5–5.1)
Sodium: 138 mmol/L (ref 135–145)
Total Bilirubin: 0.6 mg/dL (ref 0.0–1.2)
Total Protein: 6.1 g/dL — ABNORMAL LOW (ref 6.5–8.1)

## 2023-07-21 LAB — HIV ANTIBODY (ROUTINE TESTING W REFLEX): HIV Screen 4th Generation wRfx: NONREACTIVE

## 2023-07-21 LAB — HEPATITIS PANEL, ACUTE
HCV Ab: NONREACTIVE
Hep A IgM: NONREACTIVE
Hep B C IgM: NONREACTIVE
Hepatitis B Surface Ag: NONREACTIVE

## 2023-07-21 LAB — CBC
HCT: 35.6 % — ABNORMAL LOW (ref 36.0–46.0)
Hemoglobin: 12.3 g/dL (ref 12.0–15.0)
MCH: 32.9 pg (ref 26.0–34.0)
MCHC: 34.6 g/dL (ref 30.0–36.0)
MCV: 95.2 fL (ref 80.0–100.0)
Platelets: 229 10*3/uL (ref 150–400)
RBC: 3.74 MIL/uL — ABNORMAL LOW (ref 3.87–5.11)
RDW: 12.1 % (ref 11.5–15.5)
WBC: 4.3 10*3/uL (ref 4.0–10.5)
nRBC: 0 % (ref 0.0–0.2)

## 2023-07-21 LAB — RPR: RPR Ser Ql: NONREACTIVE

## 2023-07-21 LAB — GC/CHLAMYDIA PROBE AMP (~~LOC~~) NOT AT ARMC
Chlamydia: NEGATIVE
Comment: NEGATIVE
Comment: NORMAL
Neisseria Gonorrhea: NEGATIVE

## 2023-07-21 MED ORDER — BUSPIRONE HCL 5 MG PO TABS
5.0000 mg | ORAL_TABLET | Freq: Three times a day (TID) | ORAL | Status: DC | PRN
  Administered 2023-07-21: 5 mg via ORAL
  Filled 2023-07-21: qty 1

## 2023-07-21 MED ORDER — TRAZODONE HCL 50 MG PO TABS
50.0000 mg | ORAL_TABLET | Freq: Every day | ORAL | Status: DC
  Administered 2023-07-21: 50 mg via ORAL
  Filled 2023-07-21: qty 1

## 2023-07-21 MED ORDER — TAMOXIFEN CITRATE 10 MG PO TABS
10.0000 mg | ORAL_TABLET | Freq: Every day | ORAL | Status: DC
  Administered 2023-07-21 – 2023-07-22 (×2): 10 mg via ORAL
  Filled 2023-07-21 (×2): qty 1

## 2023-07-21 MED ORDER — NICOTINE 14 MG/24HR TD PT24
14.0000 mg | MEDICATED_PATCH | Freq: Every day | TRANSDERMAL | Status: DC
Start: 2023-07-21 — End: 2023-07-22
  Administered 2023-07-21 – 2023-07-22 (×2): 14 mg via TRANSDERMAL
  Filled 2023-07-21 (×2): qty 1

## 2023-07-21 MED ORDER — GABAPENTIN 300 MG PO CAPS
300.0000 mg | ORAL_CAPSULE | Freq: Every day | ORAL | Status: DC
  Administered 2023-07-21: 300 mg via ORAL
  Filled 2023-07-21: qty 1

## 2023-07-21 MED ORDER — METRONIDAZOLE 500 MG PO TABS
500.0000 mg | ORAL_TABLET | Freq: Two times a day (BID) | ORAL | Status: DC
  Administered 2023-07-21 – 2023-07-22 (×2): 500 mg via ORAL
  Filled 2023-07-21 (×2): qty 1

## 2023-07-21 MED ORDER — QUETIAPINE FUMARATE 100 MG PO TABS
200.0000 mg | ORAL_TABLET | Freq: Every day | ORAL | Status: DC
  Administered 2023-07-21: 200 mg via ORAL
  Filled 2023-07-21: qty 2

## 2023-07-21 NOTE — Plan of Care (Signed)

## 2023-07-21 NOTE — Telephone Encounter (Signed)
 Pharmacy Patient Advocate Encounter  Insurance verification completed.    The patient is insured through  [employer] . Patient has Medicare and is not eligible for a copay card, but may be able to apply for patient assistance or Medicare RX Payment Plan (Patient Must reach out to their plan, if eligible for payment plan), if available.    Ran test claim for [facility] and the current 30 day co-pay is $0.00.  Ran test claim for [facility] and the current 30 day co-pay is $0.00.  Ran test claim for [facility] and the current 30 day co-pay is $0.00.  This test claim was processed through [facility]- copay amounts may vary at other pharmacies due to pharmacy/plan contracts, or as the patient moves through the different stages of their insurance plan.

## 2023-07-21 NOTE — Progress Notes (Signed)
 Pt. Refused vital signs and other care other than medication administration at this time,

## 2023-07-21 NOTE — Progress Notes (Signed)
 Progress Note   Patient: Carol Crawford ZOX:[id number] DOB: 10-31-1975 DOA: 07/20/2023     1 DOS: the patient was seen and examined on 07/21/2023   Brief hospital course: Carol Crawford is a 48 y.o. female with medical history significant of history of anxiety, bipolar disorder, major depressive disorder, schizophrenia, ADHD, breast cancer status post lumpectomy and radiation on tamoxifen , CVA, syphilis and STD presenting to emergency department multiple complaining of vaginal discharge, itching, painful urination and   feeling sensation of food bolus stuck in the throat with irritation requesting for STD check.   Assessment and Plan: Acute supraglottitis- Continues to have difficulty swallowing. Clears initiated,will advance as tolerated. No airway involvement noted. ENT and ICU recommendations appreciated. Continue albuterol  PRN, Dulera. Continue IV rocephin , IV steroids. Monitor airway, vitals closely.  History of reactive airway disease Continue Dulera twice daily and continue albuterol  as needed.  History of secondary syphilis. High risk sexual behavior Bacterial vaginosis RPR nonreactive, titer lower side.  HIV, hepatitis panel negative. Continue IV metronidazole 500 mg twice daily for 7 days for bacterial vaginosis.   Schizophrenia Bipolar disorder type I Generalized anxiety disorder ADHD Resumed home dose Seroquel , trazodone , BuSpar .   History of breast cancer Restarted home dose tamoxifen .  Outpatient oncology follow-up.   History of CVA Patient not on antiplatelet therapy nor statin.     Out of bed to chair. Incentive spirometry. Nursing supportive care. Fall, aspiration precautions. Diet:  Diet Orders (From admission, onward)     Start     Ordered   07/21/23 0849  Diet clear liquid Room service appropriate? Yes; Fluid consistency: Thin  Diet effective now       Question Answer Comment  Room service appropriate? Yes   Fluid consistency: Thin      07/21/23  0848           DVT prophylaxis: enoxaparin  (LOVENOX ) injection 40 mg Start: 07/21/23 1000 SCDs Start: 07/20/23 2123 Place TED hose Start: 07/20/23 2123  Level of care: Progressive   Code Status: Full Code  Subjective: Patient is seen and examined today morning.  States that she was unable to swallow pills, tolerating clears.  Asks if she can take crushed pills with applesauce.  No trouble breathing.  Physical Exam: Vitals:   07/21/23 0030 07/21/23 0053 07/21/23 0500 07/21/23 0826  BP:  121/71 116/83 (!) 150/85  Pulse: (!) 49 (!) 58 (!) 55 63  Resp: 13 16 14 18   Temp:  97.9 F (36.6 C) 98 F (36.7 C) 97.6 F (36.4 C)  TempSrc:  Oral Oral Oral  SpO2: 100% 98% 100% 100%  Weight:      Height:        General - Middle aged African-American female, no apparent distress HEENT - PERRLA, EOMI, atraumatic head, non tender sinuses. Lung - Clear, basal rhonchi, wheezes. Heart - S1, S2 heard, no murmurs, rubs, trace pedal edema. Abdomen - Soft, non tender, bowel sounds good Neuro - Alert, awake and oriented x 3, non focal exam. Skin - Warm and dry.  Data Reviewed:      Latest Ref Rng & Units 07/21/2023    3:26 AM 07/20/2023    3:58 PM 01/08/2022    4:04 PM  CBC  WBC 4.0 - 10.5 K/uL 4.3  6.8  5.2   Hemoglobin 12.0 - 15.0 g/dL 81.1  91.4  78.2   Hematocrit 36.0 - 46.0 % 35.6  38.6  38.6   Platelets 150 - 400 K/uL 229  243  248       Latest Ref Rng & Units 07/21/2023    3:26 AM 07/20/2023    3:58 PM 01/08/2022    4:04 PM  BMP  Glucose 70 - 99 mg/dL 562  99  130   BUN 6 - 20 mg/dL 5  8  9    Creatinine 0.44 - 1.00 mg/dL 8.65  7.84  6.96   Sodium 135 - 145 mmol/L 138  135  141   Potassium 3.5 - 5.1 mmol/L 3.9  3.6  3.6   Chloride 98 - 111 mmol/L 108  106  110   CO2 22 - 32 mmol/L 21  20  24    Calcium 8.9 - 10.3 mg/dL 8.5  8.9  9.6    CT Soft Tissue Neck W Contrast Result Date: 07/20/2023 CLINICAL DATA:  Initial evaluation for acute soft tissue infection suspected. EXAM:  CT NECK WITH CONTRAST TECHNIQUE: Multidetector CT imaging of the neck was performed using the standard protocol following the bolus administration of intravenous contrast. RADIATION DOSE REDUCTION: This exam was performed according to the departmental dose-optimization program which includes automated exposure control, adjustment of the mA and/or kV according to patient size and/or use of iterative reconstruction technique. CONTRAST:  75mL OMNIPAQUE  IOHEXOL  350 MG/ML SOLN COMPARISON:  None Available. FINDINGS: Pharynx and larynx: Oral cavity within normal limits. Palatine tonsils within normal limits. Adenoidal soft tissues are prominent, protruding into the nasopharynx. Uvula and soft palate appear mildly edematous and swollen. Epiglottis is mildly edematous in appearance. Findings suggestive of acute supraglottitis. Trace reactive retropharyngeal effusion without frank retropharyngeal abscess. Small volume layering secretions noted within the hypopharynx. Supraglottic airway remains patent at this time. Glottis is closed but grossly symmetric and within normal limits. Subglottic airway clear. Salivary glands: Salivary glands including the parotid and submandibular glands are within normal limits. Thyroid: Normal. Lymph nodes: No enlarged or pathologic lymph nodes within the neck. Vascular: Normal vascular enhancement seen within the neck. Limited intracranial: Unremarkable. Visualized orbits: Unremarkable. Mastoids and visualized paranasal sinuses: Left maxillary sinus retention cyst noted. Paranasal sinuses are otherwise clear. Mastoid air cells and middle ear cavities are well pneumatized and free of fluid. Skeleton: No discrete or worrisome osseous lesions. Moderate spondylosis present at C5-6 and C6-7. Upper chest: No other acute finding. Other: None. IMPRESSION: 1. Mild swelling/edema about the uvula, soft palate, and epiglottis, suggestive of acute supraglottitis. Supraglottic airway remains patent at this  time. No discrete abscess or drainable fluid collection. 2. Moderate spondylosis at C5-6 and C6-7. Electronically Signed   By: Virgia Griffins M.D.   On: 07/20/2023 18:49    Family Communication: Discussed with patient, she understand and agree. All questions answered.  Disposition: Status is: Inpatient Remains inpatient appropriate because: supraglottitis, advance diet.  Planned Discharge Destination: Home     Time spent: 38 minutes  Author: Aisha Hove, MD 07/21/2023 2:15 PM Secure chat 7am to 7pm For on call review [email or web address].

## 2023-07-22 DIAGNOSIS — N760 Acute vaginitis: Secondary | ICD-10-CM | POA: Diagnosis not present

## 2023-07-22 DIAGNOSIS — F319 Bipolar disorder, unspecified: Secondary | ICD-10-CM

## 2023-07-22 DIAGNOSIS — Z7251 High risk heterosexual behavior: Secondary | ICD-10-CM | POA: Diagnosis not present

## 2023-07-22 DIAGNOSIS — Z8673 Personal history of transient ischemic attack (TIA), and cerebral infarction without residual deficits: Secondary | ICD-10-CM

## 2023-07-22 DIAGNOSIS — J0510 Acute epiglottitis without obstruction: Secondary | ICD-10-CM | POA: Diagnosis not present

## 2023-07-22 DIAGNOSIS — J4520 Mild intermittent asthma, uncomplicated: Secondary | ICD-10-CM | POA: Diagnosis not present

## 2023-07-22 DIAGNOSIS — F411 Generalized anxiety disorder: Secondary | ICD-10-CM

## 2023-07-22 MED ORDER — POLYETHYLENE GLYCOL 3350 17 G PO PACK
17.0000 g | PACK | Freq: Every day | ORAL | Status: DC
Start: 2023-07-22 — End: 2023-07-22
  Administered 2023-07-22: 17 g via ORAL
  Filled 2023-07-22: qty 1

## 2023-07-22 MED ORDER — METRONIDAZOLE 50 MG/ML ORAL SUSPENSION: 500.0000 mg | Freq: Two times a day (BID) | ORAL | 0 refills | Status: AC

## 2023-07-22 MED ORDER — SENNOSIDES-DOCUSATE SODIUM 8.6-50 MG PO TABS: 2.0000 | ORAL_TABLET | Freq: Every day | ORAL | Status: DC

## 2023-07-22 MED ORDER — DOCUSATE SODIUM 100 MG PO CAPS: 100.0000 mg | ORAL_CAPSULE | Freq: Two times a day (BID) | ORAL | Status: DC

## 2023-07-22 MED ORDER — PREDNISONE 5 MG/5ML PO SOLN: 20.0000 mg | Freq: Every day | ORAL | 0 refills | Status: AC

## 2023-07-22 MED ORDER — AMOXICILLIN-POT CLAVULANATE 600-42.9 MG/5ML PO SUSR: 600.0000 mg | Freq: Two times a day (BID) | ORAL | 0 refills | Status: AC

## 2023-07-22 NOTE — Plan of Care (Signed)

## 2023-07-22 NOTE — Discharge Summary (Signed)
 Physician Discharge Summary   Patient: Carol Crawford MRN: [id number] DOB: 1976/03/17  Admit date:     07/20/2023  Discharge date: 07/22/23  Discharge Physician: Aisha Hove   PCP: Raina Bunting, DO   Recommendations at discharge:  {Tip this will not be part of the note when signed- Example include specific recommendations for outpatient follow-up, pending tests to follow-up on. (Optional):26781}  PCP follow up in 1 week. ENT follow up as scheduled.  Discharge Diagnoses: Principal Problem:   Acute epiglottitis Active Problems:   History of secondary syphilis of skin   High risk sexual behavior   Bipolar 1 disorder (HCC) with SA 2020 (OD pain pills)   Schizophrenia (HCC)   GAD (generalized anxiety disorder)   ADHD   History of breast cancer   History of CVA (cerebrovascular accident)   Reactive airway disease   Bacterial vaginosis  Resolved Problems:   * No resolved hospital problems. [facility] Course: No notes on file  Assessment and Plan: No notes have been filed under this hospital service. Service: Hospitalist     {Tip this will not be part of the note when signed Body mass index is 29.07 kg/m. , ,  (Optional):26781}  {(NOTE) Pain control PDMP Statment (Optional):26782} Consultants: *** Procedures performed: ***  Disposition: {Plan; Disposition:26390} Diet recommendation:  Discharge Diet Orders (From admission, onward)     Start     Ordered   07/22/23 0000  Diet - low sodium heart healthy        07/22/23 1557           {Diet_Plan:26776} DISCHARGE MEDICATION: Allergies as of 07/22/2023       Reactions   Clindamycin /lincomycin Rash   Shrimp [shellfish Allergy] Hives   Bee Venom Swelling   Wasp Venom Swelling   Penicillins Hives, Other (See Comments)   Has patient had a PCN reaction causing immediate rash, facial/tongue/throat swelling, SOB or lightheadedness with hypotension: No Has patient had a PCN reaction causing severe  rash involving mucus membranes or skin necrosis: Yes Has patient had a PCN reaction that required hospitalization: No Has patient had a PCN reaction occurring within the last 10 years: Yes If all of the above answers are "NO", then may proceed with Cephalosporin use.   Tomato Rash        Medication List     STOP taking these medications    ibuprofen  400 MG tablet Commonly known as: ADVIL        TAKE these medications    acetaminophen  500 MG tablet Commonly known as: TYLENOL  Take 500 mg by mouth every 6 (six) hours as needed for moderate pain or mild pain.   albuterol  108 (90 Base) MCG/ACT inhaler Commonly known as: VENTOLIN  HFA Inhale 2 puffs into the lungs every 4 (four) hours as needed for wheezing or shortness of breath (cough).   amoxicillin -clavulanate 600-42.9 MG/5ML suspension Commonly known as: AUGMENTIN  Take 5 mLs (600 mg total) by mouth 2 (two) times daily for 5 days.   busPIRone  5 MG tablet Commonly known as: BUSPAR  Take 1 tablet (5 mg total) by mouth 3 (three) times daily as needed (anxiety).   cyclobenzaprine  5 MG tablet Commonly known as: FLEXERIL  Take 1 tablet (5 mg total) by mouth 3 (three) times daily as needed for muscle spasms.   gabapentin  300 MG capsule Commonly known as: NEURONTIN  Take 1 capsule (300 mg total) by mouth at bedtime.   metroNIDAZOLE 50 mg/ml oral suspension Commonly known as: FLAGYL Take 10 mLs (  500 mg total) by mouth 2 (two) times daily for 5 days. Start taking on: July 23, 2023   multivitamin with minerals Tabs tablet Take 1 tablet by mouth daily.   nicotine  14 mg/24hr patch Commonly known as: Nicoderm CQ  Place 1 patch (14 mg total) onto the skin daily. For 28 days   predniSONE  5 MG/5ML solution Take 20 mLs (20 mg total) by mouth daily with breakfast for 5 days.   QUEtiapine  200 MG tablet Commonly known as: SEROquel  Take 1 tablet (200 mg total) by mouth at bedtime.   Spiriva  Respimat 2.5 MCG/ACT Aers Generic drug:  Tiotropium Bromide Monohydrate  Inhale 2 puffs into the lungs daily.   tamoxifen  20 MG tablet Commonly known as: NOLVADEX  Take 0.5 tablets (10 mg total) by mouth daily.   traZODone  50 MG tablet Commonly known as: DESYREL  Take 1 tablet (50 mg total) by mouth at bedtime.   VITAMIN C PO Take 1 tablet by mouth daily.        Follow-up Information     Raina Bunting, DO Follow up in 1 week(s).   Specialty: Family Medicine Contact information: [address] [place] [address] [phone]         Vernadine Golas, MD. Call in 2 week(s).   Specialty: Otolaryngology Contact information: [address] [place] [address] [place] [address] [phone]                Discharge Exam: Carol Crawford Weights   07/20/23 2220  Weight: 84.2 kg   ***  Condition at discharge: {DC Condition:26389}  The results of significant diagnostics from this hospitalization (including imaging, microbiology, ancillary and laboratory) are listed below for reference.   Imaging Studies: CT Soft Tissue Neck W Contrast Result Date: 07/20/2023 CLINICAL DATA:  Initial evaluation for acute soft tissue infection suspected. EXAM: CT NECK WITH CONTRAST TECHNIQUE: Multidetector CT imaging of the neck was performed using the standard protocol following the bolus administration of intravenous contrast. RADIATION DOSE REDUCTION: This exam was performed according to the departmental dose-optimization program which includes automated exposure control, adjustment of the mA and/or kV according to patient size and/or use of iterative reconstruction technique. CONTRAST:  75mL OMNIPAQUE  IOHEXOL  350 MG/ML SOLN COMPARISON:  None Available. FINDINGS: Pharynx and larynx: Oral cavity within normal limits. Palatine tonsils within normal limits. Adenoidal soft tissues are prominent, protruding into the nasopharynx. Uvula and soft palate appear mildly edematous and swollen. Epiglottis is mildly edematous in appearance. Findings  suggestive of acute supraglottitis. Trace reactive retropharyngeal effusion without frank retropharyngeal abscess. Small volume layering secretions noted within the hypopharynx. Supraglottic airway remains patent at this time. Glottis is closed but grossly symmetric and within normal limits. Subglottic airway clear. Salivary glands: Salivary glands including the parotid and submandibular glands are within normal limits. Thyroid: Normal. Lymph nodes: No enlarged or pathologic lymph nodes within the neck. Vascular: Normal vascular enhancement seen within the neck. Limited intracranial: Unremarkable. Visualized orbits: Unremarkable. Mastoids and visualized paranasal sinuses: Left maxillary sinus retention cyst noted. Paranasal sinuses are otherwise clear. Mastoid air cells and middle ear cavities are well pneumatized and free of fluid. Skeleton: No discrete or worrisome osseous lesions. Moderate spondylosis present at C5-6 and C6-7. Upper chest: No other acute finding. Other: None. IMPRESSION: 1. Mild swelling/edema about the uvula, soft palate, and epiglottis, suggestive of acute supraglottitis. Supraglottic airway remains patent at this time. No discrete abscess or drainable fluid collection. 2. Moderate spondylosis at C5-6 and C6-7. Electronically Signed   By: Amye Baller  Tresea Frost M.D.   On: 07/20/2023 18:49   MM 3D DIAGNOSTIC MAMMOGRAM UNILATERAL RIGHT BREAST Result Date: 06/26/2023 CLINICAL DATA:  48 year old with a personal history of malignant lumpectomy of the UPPER OUTER QUADRANT of the RIGHT breast in 2020, presenting with focal pain in the far outer breast near the lumpectomy site which the patient states has worsened over the past few weeks. EXAM: DIGITAL DIAGNOSTIC UNILATERAL RIGHT MAMMOGRAM WITH TOMOSYNTHESIS AND CAD; ULTRASOUND RIGHT BREAST LIMITED TECHNIQUE: Right digital diagnostic mammography and breast tomosynthesis was performed. The images were evaluated with computer-aided detection. ; Targeted  ultrasound examination of the right breast was performed COMPARISON:  Previous exam(s). ACR Breast Density Category c: The breasts are heterogeneously dense, which may obscure small masses. FINDINGS: Full field CC and MLO views and a spot tangential view of the site of focal pain were obtained. No mammographic abnormality in the area of focal pain in the far outer breast on the spot tangential view. The scar from the prior lumpectomy is present deep to this location. No new or suspicious findings. Targeted ultrasound is performed in the area of focal pain, demonstrating normal fibrofatty tissue at 9 o'clock 13 cm from the nipple. No cyst, solid mass or abnormal acoustic shadowing is identified. On correlative physical examination, there is no palpable abnormality in the far outer breast, though the patient describes tenderness to palpation. IMPRESSION: No mammographic or sonographic evidence of malignancy involving the RIGHT breast. RECOMMENDATION: Annual BILATERAL screening mammography which is due next month in May, 2025. I have discussed the findings and recommendations with the patient. If applicable, a reminder letter will be sent to the patient regarding the next appointment. BI-RADS CATEGORY  2: Benign. Electronically Signed   By: Rinda Cheers M.D.   On: 06/26/2023 15:34   US  LIMITED ULTRASOUND INCLUDING AXILLA RIGHT BREAST Result Date: 06/26/2023 CLINICAL DATA:  48 year old with a personal history of malignant lumpectomy of the UPPER OUTER QUADRANT of the RIGHT breast in 2020, presenting with focal pain in the far outer breast near the lumpectomy site which the patient states has worsened over the past few weeks. EXAM: DIGITAL DIAGNOSTIC UNILATERAL RIGHT MAMMOGRAM WITH TOMOSYNTHESIS AND CAD; ULTRASOUND RIGHT BREAST LIMITED TECHNIQUE: Right digital diagnostic mammography and breast tomosynthesis was performed. The images were evaluated with computer-aided detection. ; Targeted ultrasound examination of  the right breast was performed COMPARISON:  Previous exam(s). ACR Breast Density Category c: The breasts are heterogeneously dense, which may obscure small masses. FINDINGS: Full field CC and MLO views and a spot tangential view of the site of focal pain were obtained. No mammographic abnormality in the area of focal pain in the far outer breast on the spot tangential view. The scar from the prior lumpectomy is present deep to this location. No new or suspicious findings. Targeted ultrasound is performed in the area of focal pain, demonstrating normal fibrofatty tissue at 9 o'clock 13 cm from the nipple. No cyst, solid mass or abnormal acoustic shadowing is identified. On correlative physical examination, there is no palpable abnormality in the far outer breast, though the patient describes tenderness to palpation. IMPRESSION: No mammographic or sonographic evidence of malignancy involving the RIGHT breast. RECOMMENDATION: Annual BILATERAL screening mammography which is due next month in May, 2025. I have discussed the findings and recommendations with the patient. If applicable, a reminder letter will be sent to the patient regarding the next appointment. BI-RADS CATEGORY  2: Benign. Electronically Signed   By: Rinda Cheers M.D.   On: 06/26/2023  15:34    Microbiology: Results for orders placed or performed during the hospital encounter of 07/20/23  Wet prep, genital     Status: Abnormal   Collection Time: 07/20/23  5:05 PM   Specimen: PATH Cytology Ancillary Only  Result Value Ref Range Status   Yeast Wet Prep HPF POC NONE SEEN NONE SEEN Final   Trich, Wet Prep NONE SEEN NONE SEEN Final   Clue Cells Wet Prep HPF POC PRESENT (A) NONE SEEN Final   WBC, Wet Prep HPF POC <10 <10 Final   Sperm NONE SEEN  Final    Comment: Performed at [facility], [address] [place], [place] [address]  Culture, blood (Routine X 2) w Reflex to ID Panel     Status: None (Preliminary result)   Collection  Time: 07/20/23 10:37 PM   Specimen: BLOOD  Result Value Ref Range Status   Specimen Description BLOOD BLOOD LEFT ARM  Final   Special Requests   Final    BOTTLES DRAWN AEROBIC AND ANAEROBIC Blood Culture adequate volume   Culture   Final    NO GROWTH 2 DAYS Performed at [facility], [address] [place], [place] [address]    Report Status PENDING  Incomplete  Culture, blood (Routine X 2) w Reflex to ID Panel     Status: None (Preliminary result)   Collection Time: 07/20/23 10:38 PM   Specimen: BLOOD  Result Value Ref Range Status   Specimen Description BLOOD BLOOD LEFT ARM  Final   Special Requests   Final    BOTTLES DRAWN AEROBIC AND ANAEROBIC Blood Culture adequate volume   Culture   Final    NO GROWTH 2 DAYS Performed at [facility], [address] [place], [place] [address]    Report Status PENDING  Incomplete    Labs: CBC: Recent Labs  Lab 07/20/23 1558 07/21/23 0326  WBC 6.8 4.3  NEUTROABS 2.9  --   HGB 13.4 12.3  HCT 38.6 35.6*  MCV 95.5 95.2  PLT 243 229   Basic Metabolic Panel: Recent Labs  Lab 07/20/23 1558 07/21/23 0326  NA 135 138  K 3.6 3.9  CL 106 108  CO2 20* 21*  GLUCOSE 99 174*  BUN 8 5*  CREATININE 0.85 0.95  CALCIUM 8.9 8.5*   Liver Function Tests: Recent Labs  Lab 07/21/23 0326  AST 17  ALT 20  ALKPHOS 52  BILITOT 0.6  PROT 6.1*  ALBUMIN 3.5   CBG: No results for input(s): "GLUCAP" in the last 168 hours.  Discharge time spent: {LESS THAN/GREATER URKY:70623} 30 minutes.  Signed: Aisha Hove, MD [facility] 07/22/2023

## 2023-07-23 ENCOUNTER — Telehealth: Payer: Self-pay

## 2023-07-23 ENCOUNTER — Other Ambulatory Visit: Payer: Self-pay | Admitting: Family Medicine

## 2023-07-23 DIAGNOSIS — Z720 Tobacco use: Secondary | ICD-10-CM

## 2023-07-23 NOTE — Telephone Encounter (Signed)
 Copied from CRM [id number]. Topic: General - Other >> Jul 23, 2023 10:05 AM Jeris Montes S wrote: Reason for CRM: Patient was recently discharged from hospital, but she is requesting medication to have a bowel movement if possible

## 2023-07-24 ENCOUNTER — Telehealth: Admitting: Family Medicine

## 2023-07-24 ENCOUNTER — Encounter: Payer: Self-pay | Admitting: Family Medicine

## 2023-07-24 DIAGNOSIS — F17210 Nicotine dependence, cigarettes, uncomplicated: Secondary | ICD-10-CM

## 2023-07-24 DIAGNOSIS — J0510 Acute epiglottitis without obstruction: Secondary | ICD-10-CM | POA: Diagnosis not present

## 2023-07-24 DIAGNOSIS — R110 Nausea: Secondary | ICD-10-CM

## 2023-07-24 DIAGNOSIS — Z720 Tobacco use: Secondary | ICD-10-CM

## 2023-07-24 DIAGNOSIS — K5900 Constipation, unspecified: Secondary | ICD-10-CM

## 2023-07-24 MED ORDER — NICOTINE 14 MG/24HR TD PT24: 14.0000 mg | MEDICATED_PATCH | Freq: Every day | TRANSDERMAL | 0 refills | Status: AC

## 2023-07-24 MED ORDER — ONDANSETRON 4 MG PO TBDP: 4.0000 mg | ORAL_TABLET | Freq: Three times a day (TID) | ORAL | 0 refills | Status: AC | PRN

## 2023-07-24 MED ORDER — POLYETHYLENE GLYCOL 3350 17 GM/SCOOP PO POWD: 17.0000 g | Freq: Two times a day (BID) | ORAL | 0 refills | Status: AC | PRN

## 2023-07-24 NOTE — Progress Notes (Signed)
 Subjective:    Patient ID: Carol Crawford, female    DOB: [birth year], 48 y.o.   MRN: [id number]  Carol Crawford is a 48 y.o. female presenting on 07/24/2023 for Other (Epiglottitis, Constipation)   Virtual / Telehealth Encounter - Video Visit via MyChart The purpose of this virtual visit is to provide medical care while limiting exposure to the novel coronavirus (COVID19) for both patient and office staff.  Consent was obtained for remote visit:  Yes.   Answered questions that patient had about telehealth interaction:  Yes.   I discussed the limitations, risks, security and privacy concerns of performing an evaluation and management service by video/telephone. I also discussed with the patient that there may be a patient responsible charge related to this service. The patient expressed understanding and agreed to proceed.  Patient Location: Home Provider Location: [facility] (Office)  Participants in virtual visit: - Patient: Carol Crawford - CMA: Nolberto Batty CMA - Provider: Dr Romeo Co   HPI  Discussed the use of AI scribe software for clinical note transcription with the patient, who gave verbal consent to proceed.  History of Present Illness   Carol Crawford is a 48 year old female who presents with throat and bowel issues following a recent hospital discharge for epiglottitis.   HOSPITAL FOLLOW-UP VISIT  Hospital/Location: [facility] Date of Admission: 07/20/23 Date of Discharge: 07/22/23 Transitions of care telephone call: Not completed  Reason for Admission: Sore Throat Epiglottitis  FOLLOW-UP  - Hospital H&P and Discharge Summary have been reviewed - Patient presents today about 2 days after recent hospitalization.  She was hospitalized from April 27 to April 29 for epiglottitis, characterized by swelling and irritation in the throat, with symptoms of food getting stuck and difficulty speaking. A CT scan of the neck and throat confirmed swelling of the  epiglottis. ENT consultation in hospital. She was treated with IV antibiotics and steroids during her hospital stay, which were transitioned to liquid antibiotics and a five-day course of oral steroids upon discharge. Her throat symptoms are worse in the mornings. She has stopped smoking vapes, cigarettes, and marijuana. She was in the ICU during her hospital stay  In addition to her throat issues, she is experiencing constipation. She last had a bowel movement on Friday, April 25, and typically has daily bowel movements. She reports abdominal pain, bloating, and nausea. She has tried using MiraLAX  powder, taking half a capful, but it has not been effective. She was not prescribed any pain medication upon discharge, but she did receive pain medication while hospitalized, which can contribute to constipation.  She has been using a 14 mg nicotine  patch to aid in smoking cessation, which she started in the hospital.      I have reviewed the discharge medication list, and have reconciled the current and discharge medications today.       02/19/2023    6:35 PM 12/26/2022    3:03 PM 11/11/2022    4:28 PM  Depression screen PHQ 2/9  Decreased Interest 1 0 3  Down, Depressed, Hopeless 1 0 3  PHQ - 2 Score 2 0 6  Altered sleeping 2 0 3  Tired, decreased energy 2 0 3  Change in appetite 0 0 2  Feeling bad or failure about yourself  0 0 0  Trouble concentrating 1 0 2  Moving slowly or fidgety/restless 0 0 0  Suicidal thoughts 0 0 0  PHQ-9 Score 7 0 16  Difficult doing work/chores  Somewhat difficult Not difficult at all Not difficult at all       11/11/2022    4:29 PM 09/06/2021   10:20 AM 09/18/2020   10:14 AM  GAD 7 : Generalized Anxiety Score  Nervous, Anxious, on Edge 3 3 2   Control/stop worrying 3 3 3   Worry too much - different things 3 3 3   Trouble relaxing 3 3 2   Restless 3 3 2   Easily annoyed or irritable 3 3 3   Afraid - awful might happen 0 1 2  Total GAD 7 Score 18 19 17    Anxiety Difficulty Very difficult Very difficult Very difficult    Social History   Tobacco Use   Smoking status: Every Day    Current packs/day: 0.25    Average packs/day: 0.3 packs/day for 30.0 years (7.5 ttl pk-yrs)    Types: Cigarettes   Smokeless tobacco: Never   Tobacco comments:    6 cigs daily  Vaping Use   Vaping status: Former   Start date: 08/22/2017  Substance Use Topics   Alcohol use: Yes    Alcohol/week: 2.0 standard drinks of alcohol    Types: 2 Cans of beer per week    Comment: occasionally   Drug use: Yes    Types: Marijuana    Comment: last use 01/10/21 2x/wk    Review of Systems Per HPI unless specifically indicated above     Objective:    There were no vitals taken for this visit.  Wt Readings from Last 3 Encounters:  07/20/23 185 lb 9.6 oz (84.2 kg)  11/11/22 166 lb 11.2 oz (75.6 kg)  11/04/22 166 lb 11.2 oz (75.6 kg)     Physical Exam  Note examination was completely remotely via video observation objective data only  Gen - well-appearing, no acute distress, appears tired and slight hoarse voice HEENT - eyes appear clear without discharge or redness Heart/Lungs - cannot examine virtually - observed no evidence of coughing or labored breathing. Abd - cannot examine virtually  Skin - face visible today- no rash Neuro - alert, oriented Psych - not anxious appearing   Results for orders placed or performed during the hospital encounter of 07/20/23  CBC with Differential   Collection Time: 07/20/23  3:58 PM  Result Value Ref Range   WBC 6.8 4.0 - 10.5 K/uL   RBC 4.04 3.87 - 5.11 MIL/uL   Hemoglobin 13.4 12.0 - 15.0 g/dL   HCT 13.2 44.0 - 10.2 %   MCV 95.5 80.0 - 100.0 fL   MCH 33.2 26.0 - 34.0 pg   MCHC 34.7 30.0 - 36.0 g/dL   RDW 72.5 36.6 - 44.0 %   Platelets 243 150 - 400 K/uL   nRBC 0.0 0.0 - 0.2 %   Neutrophils Relative % 44 %   Neutro Abs 2.9 1.7 - 7.7 K/uL   Lymphocytes Relative 49 %   Lymphs Abs 3.3 0.7 - 4.0 K/uL    Monocytes Relative 7 %   Monocytes Absolute 0.5 0.1 - 1.0 K/uL   Eosinophils Relative 0 %   Eosinophils Absolute 0.0 0.0 - 0.5 K/uL   Basophils Relative 0 %   Basophils Absolute 0.0 0.0 - 0.1 K/uL   Immature Granulocytes 0 %   Abs Immature Granulocytes 0.01 0.00 - 0.07 K/uL  Basic metabolic panel   Collection Time: 07/20/23  3:58 PM  Result Value Ref Range   Sodium 135 135 - 145 mmol/L   Potassium 3.6 3.5 - 5.1 mmol/L  Chloride 106 98 - 111 mmol/L   CO2 20 (L) 22 - 32 mmol/L   Glucose, Bld 99 70 - 99 mg/dL   BUN 8 6 - 20 mg/dL   Creatinine, Ser 1.61 0.44 - 1.00 mg/dL   Calcium 8.9 8.9 - 09.6 mg/dL   GFR, Estimated >04 >54 mL/min   Anion gap 9 5 - 15  Urinalysis, Routine w reflex microscopic -Urine, Clean Catch   Collection Time: 07/20/23  5:04 PM  Result Value Ref Range   Color, Urine YELLOW YELLOW   APPearance HAZY (A) CLEAR   Specific Gravity, Urine 1.010 1.005 - 1.030   pH 5.0 5.0 - 8.0   Glucose, UA NEGATIVE NEGATIVE mg/dL   Hgb urine dipstick SMALL (A) NEGATIVE   Bilirubin Urine NEGATIVE NEGATIVE   Ketones, ur NEGATIVE NEGATIVE mg/dL   Protein, ur NEGATIVE NEGATIVE mg/dL   Nitrite NEGATIVE NEGATIVE   Leukocytes,Ua NEGATIVE NEGATIVE   RBC / HPF 0-5 0 - 5 RBC/hpf   WBC, UA 0-5 0 - 5 WBC/hpf   Bacteria, UA RARE (A) NONE SEEN   Squamous Epithelial / HPF 11-20 0 - 5 /HPF   Mucus PRESENT   Wet prep, genital   Collection Time: 07/20/23  5:05 PM   Specimen: PATH Cytology Ancillary Only  Result Value Ref Range   Yeast Wet Prep HPF POC NONE SEEN NONE SEEN   Trich, Wet Prep NONE SEEN NONE SEEN   Clue Cells Wet Prep HPF POC PRESENT (A) NONE SEEN   WBC, Wet Prep HPF POC <10 <10   Sperm NONE SEEN   GC/Chlamydia probe amp   Collection Time: 07/20/23  5:05 PM  Result Value Ref Range   Neisseria Gonorrhea Negative    Chlamydia Negative    Comment Normal Reference Ranger Chlamydia - Negative    Comment      Normal Reference Range Neisseria Gonorrhea - Negative  RPR    Collection Time: 07/20/23  7:10 PM  Result Value Ref Range   RPR Ser Ql NON REACTIVE NON REACTIVE  Culture, blood (Routine X 2) w Reflex to ID Panel   Collection Time: 07/20/23 10:37 PM   Specimen: BLOOD  Result Value Ref Range   Specimen Description BLOOD BLOOD LEFT ARM    Special Requests      BOTTLES DRAWN AEROBIC AND ANAEROBIC Blood Culture adequate volume   Culture      NO GROWTH 4 DAYS Performed at [facility], [address], [place], [place] [address]    Report Status PENDING   HIV Antibody (routine testing w rflx)   Collection Time: 07/20/23 10:37 PM  Result Value Ref Range   HIV Screen 4th Generation wRfx Non Reactive Non Reactive  Hepatitis panel, acute   Collection Time: 07/20/23 10:37 PM  Result Value Ref Range   Hepatitis B Surface Ag NON REACTIVE NON REACTIVE   HCV Ab NON REACTIVE NON REACTIVE   Hep A IgM NON REACTIVE NON REACTIVE   Hep B C IgM NON REACTIVE NON REACTIVE  Culture, blood (Routine X 2) w Reflex to ID Panel   Collection Time: 07/20/23 10:38 PM   Specimen: BLOOD  Result Value Ref Range   Specimen Description BLOOD BLOOD LEFT ARM    Special Requests      BOTTLES DRAWN AEROBIC AND ANAEROBIC Blood Culture adequate volume   Culture      NO GROWTH 4 DAYS Performed at [facility], [address] [place], [place] [address]  Report Status PENDING   Comprehensive metabolic panel   Collection Time: 07/21/23  3:26 AM  Result Value Ref Range   Sodium 138 135 - 145 mmol/L   Potassium 3.9 3.5 - 5.1 mmol/L   Chloride 108 98 - 111 mmol/L   CO2 21 (L) 22 - 32 mmol/L   Glucose, Bld 174 (H) 70 - 99 mg/dL   BUN 5 (L) 6 - 20 mg/dL   Creatinine, Ser 1.61 0.44 - 1.00 mg/dL   Calcium 8.5 (L) 8.9 - 10.3 mg/dL   Total Protein 6.1 (L) 6.5 - 8.1 g/dL   Albumin 3.5 3.5 - 5.0 g/dL   AST 17 15 - 41 U/L   ALT 20 0 - 44 U/L   Alkaline Phosphatase 52 38 - 126 U/L   Total Bilirubin 0.6 0.0 - 1.2 mg/dL   GFR, Estimated >09 >60 mL/min   Anion gap 9 5 -  15  CBC   Collection Time: 07/21/23  3:26 AM  Result Value Ref Range   WBC 4.3 4.0 - 10.5 K/uL   RBC 3.74 (L) 3.87 - 5.11 MIL/uL   Hemoglobin 12.3 12.0 - 15.0 g/dL   HCT 45.4 (L) 09.8 - 11.9 %   MCV 95.2 80.0 - 100.0 fL   MCH 32.9 26.0 - 34.0 pg   MCHC 34.6 30.0 - 36.0 g/dL   RDW 14.7 82.9 - 56.2 %   Platelets 229 150 - 400 K/uL   nRBC 0.0 0.0 - 0.2 %      Assessment & Plan:   Problem List Items Addressed This Visit     Acute epiglottitis - Primary   Other Visit Diagnoses       Constipation, unspecified constipation type       Relevant Medications   polyethylene glycol powder (GLYCOLAX /MIRALAX ) 17 GM/SCOOP powder     Nausea       Relevant Medications   ondansetron  (ZOFRAN -ODT) 4 MG disintegrating tablet     Tobacco abuse       Relevant Medications   nicotine  (NICODERM CQ ) 14 mg/24hr patch     Cigarette nicotine  dependence without complication       Relevant Medications   nicotine  (NICODERM CQ ) 14 mg/24hr patch        Epiglottitis Recent hospitalization for epiglottitis, ENT eval, CT imaging done Treated with IV antibiotics and steroids, now on oral forms. Inflammation likely due to vaping. Smoking cessation achieved. Infectious work up inconclusive - Continue oral liquid antibiotics and steroids for three days. - Avoid smoking and vaping. - Consider ENT follow-up if symptoms persist. - Encourage hydration and nutrition.  Constipation Constipation with abdominal pain and bloating, likely due to reduced intake and possible opioid use. - She was underdosing miralax  at half cap - Increase MiraLAX  dosage to 1-2 capfuls, up to four capfuls at one dose, repeat as needed. - Order MiraLAX  if needed generic rx - Prescribe Zofran  for nausea.  Smoking cessation discussed - Order nicotine  patches (14 mg) for smoking cessation. - Provide information on Quitline for support and potential free nicotine  patches.         No orders of the defined types were placed in this  encounter.   Meds ordered this encounter  Medications   polyethylene glycol powder (GLYCOLAX /MIRALAX ) 17 GM/SCOOP powder    Sig: Take 17-34 g by mouth 2 (two) times daily as needed for moderate constipation.    Dispense:  255 g    Refill:  0   ondansetron  (ZOFRAN -ODT) 4 MG disintegrating  tablet    Sig: Take 1 tablet (4 mg total) by mouth every 8 (eight) hours as needed for nausea or vomiting.    Dispense:  30 tablet    Refill:  0   nicotine  (NICODERM CQ ) 14 mg/24hr patch    Sig: Place 1 patch (14 mg total) onto the skin daily.    Dispense:  28 patch    Refill:  0    Follow up plan: Return if symptoms worsen or fail to improve.   Patient verbalizes understanding with the above medical recommendations including the limitation of remote medical advice.  Specific follow-up and call-back criteria were given for patient to follow-up or seek medical care more urgently if needed.  Total duration of direct patient care provided via video conference: 15 minutes   Domingo Friend, DO [facility] 07/24/2023, 6:46 PM

## 2023-07-24 NOTE — Patient Instructions (Addendum)
 Thank you for coming to the office today.  For Constipation (less frequent bowel movement that can be hard dry or involve straining).  Recommend trying OTC Miralax  17g = 1 capful in large glass water once daily for now, try several days to see if working, goal is soft stool or BM 1-2 times daily, if too loose then reduce dose or try every other day. If not effective may need to increase it to 2 doses at once in AM or may do 1 in morning and 1 in afternoon/evening  - This medicine is very safe and can be used often without any problem and will not make you dehydrated. It is good for use on AS NEEDED BASIS or even MAINTENANCE therapy for longer term for several days to weeks at a time to help regulate bowel movements  Other more natural remedies or preventative treatment: - Increase hydration with water - Increase fiber in diet (high fiber foods = vegetables, leafy greens, oats/grains) - May take OTC Fiber supplement (metamucil powder or pill/gummy) - May try OTC Probiotic  --------------------------------------   For smoking cessation 1 800-QUIT NOW  Nicotine  Patches Dosing: Brand Dosage Duration  Habitrol  / "Generic nicotine  patches" (21,14, or 7mg /day over 24 hours) 21mg / 24 hours 14mg /24 hours 7mg /24 hours 4 weeks then 2 weeks then 2 weeks  Nicoderm CQ   (21, 14, or 7mg /day over 24 hours) Original and Clear 21mg /24 hours 14mg /24 hours 7mg /24 hours 6 weeks then 2 weeks then 2 weeks  *Consider a small initial dosage for light smokers (10 cigarettes daily or less)  Prescribing Instructions: No smoking while using the patch Do not remove the patch from its sealed protective pouch until you are ready to apply.  Place the patch firmly on your skin in a relatively hairless location between the neck and waist. The "old" patch should be removed daily and the "new" patch should be applied in a different location.  Do not return to a previous site for at least 1 week. Discard the  used patch in a way that prevents children or pets from possible exposure. Wash your hands with water when you have finished applying. The nicotine  patch will cause a local skin reaction in up to 50% of patients.  Skin reactions are usually mild and are self-limiting, but may worsen over the course of therapy.  Rotating patch sites and local treatment with a topical steroid cream and may improve these reactions. If patient experiences sleep disturbances, remove patch at bedtime    Please schedule a Follow-up Appointment to: Return if symptoms worsen or fail to improve.  If you have any other questions or concerns, please feel free to call the office or send a message through MyChart. You may also schedule an earlier appointment if necessary.  Additionally, you may be receiving a survey about your experience at our office within a few days to 1 week by e-mail or mail. We value your feedback.  Domingo Friend, DO [facility], [place]

## 2023-07-25 LAB — CULTURE, BLOOD (ROUTINE X 2)
Culture: NO GROWTH
Culture: NO GROWTH
Special Requests: ADEQUATE
Special Requests: ADEQUATE

## 2023-07-26 NOTE — Telephone Encounter (Signed)
 Duplicate request, Rx ordered 07/24/23 Requested Prescriptions  Pending Prescriptions Disp Refills   nicotine  (NICODERM CQ ) 14 mg/24hr patch 28 patch 0    Sig: Place 1 patch (14 mg total) onto the skin daily. For 28 days     Psychiatry:  Drug Dependence Therapy Failed - 07/26/2023  2:48 AM      Failed - Valid encounter within last 12 months    Recent Outpatient Visits           2 days ago Acute epiglottitis without airway obstruction   [facility] [place], [place]leen [place], [place]

## 2023-08-14 ENCOUNTER — Other Ambulatory Visit: Payer: Self-pay | Admitting: Family Medicine

## 2023-08-14 DIAGNOSIS — Z1231 Encounter for screening mammogram for malignant neoplasm of breast: Secondary | ICD-10-CM

## 2023-08-27 ENCOUNTER — Ambulatory Visit
Admission: RE | Admit: 2023-08-27 | Discharge: 2023-08-27 | Disposition: A | Discharge status: Home / Self Care | Source: Ambulatory Visit | Attending: Family Medicine | Admitting: Family Medicine

## 2023-08-27 DIAGNOSIS — Z1231 Encounter for screening mammogram for malignant neoplasm of breast: Secondary | ICD-10-CM | POA: Diagnosis not present

## 2023-10-02 ENCOUNTER — Telehealth: Payer: Self-pay

## 2023-10-02 DIAGNOSIS — L538 Other specified erythematous conditions: Secondary | ICD-10-CM | POA: Diagnosis not present

## 2023-10-02 DIAGNOSIS — L732 Hidradenitis suppurativa: Secondary | ICD-10-CM | POA: Diagnosis not present

## 2023-10-02 DIAGNOSIS — R208 Other disturbances of skin sensation: Secondary | ICD-10-CM | POA: Diagnosis not present

## 2023-10-02 DIAGNOSIS — L728 Other follicular cysts of the skin and subcutaneous tissue: Secondary | ICD-10-CM | POA: Diagnosis not present

## 2023-10-02 NOTE — Telephone Encounter (Signed)
 Copied from CRM [id number]. Topic: Medical Record Request - Records Request >> Oct 02, 2023  1:09 PM [name] wrote: Reason for CRM: Pt called in about her FL2 Form and needing it for the house management.   Pt would like to know if an appointment is needed and also was advised that primary care provides the form.    Pls follow up with pt [phone]

## 2023-10-13 ENCOUNTER — Telehealth: Admitting: Family Medicine

## 2023-10-13 DIAGNOSIS — L732 Hidradenitis suppurativa: Secondary | ICD-10-CM | POA: Diagnosis not present

## 2023-10-13 DIAGNOSIS — F319 Bipolar disorder, unspecified: Secondary | ICD-10-CM | POA: Diagnosis not present

## 2023-10-13 NOTE — Patient Instructions (Signed)
 Thank you for coming to the office today.    Please schedule a Follow-up Appointment to: No follow-ups on file.  If you have any other questions or concerns, please feel free to call the office or send a message through MyChart. You may also schedule an earlier appointment if necessary.  Additionally, you may be receiving a survey about your experience at our office within a few days to 1 week by e-mail or mail. We value your feedback.  Saralyn Pilar, DO [facility], [place]

## 2023-10-13 NOTE — Progress Notes (Signed)
 Subjective:    Patient ID: Carol Crawford, female    DOB: 19-Aug-1975, 48 y.o.   MRN: [id number]  Carol Crawford is a 48 y.o. female presenting on 10/13/2023 for Bipolar, Hidradenitis, FL2 update   Virtual / Telehealth Encounter - Video Visit via MyChart The purpose of this virtual visit is to provide medical care while limiting exposure to the novel coronavirus (COVID19) for both patient and office staff.  Consent was obtained for remote visit:  Yes.   Answered questions that patient had about telehealth interaction:  Yes.   I discussed the limitations, risks, security and privacy concerns of performing an evaluation and management service by video/telephone. I also discussed with the patient that there may be a patient responsible charge related to this service. The patient expressed understanding and agreed to proceed.  Patient Location: Home Provider Location: [facility] (Office)  Participants in virtual visit: - Patient: Carol Crawford - CMA: Alan Fontana CMA - Provider: Dr Edman   HPI  Discussed the use of AI scribe software for clinical note transcription with the patient, who gave verbal consent to proceed.  History of Present Illness   Carol Crawford is a 48 year old female who presents for an FL2 form update and follow-up on recent hospitalizations and dermatological diagnosis.  Bipolar Type 1 Previuosly followed by Psychiatry. On medication management.  Oropharyngeal symptoms - Hospitalized in April for epiglottitis with sore throat and swelling - Treated with antibiotics and steroids during hospitalization - Continues to experience throat irritation - Currently taking cough syrup for ongoing throat symptoms  Hidradenitis Suppurativa  / Cutaneous lesions - Diagnosed with hidradenitis suppurativa - Recurrent boils located under the armpits, below the belly button, and in the inner thighs        02/19/2023    6:35 PM 12/26/2022    3:03 PM  11/11/2022    4:28 PM  Depression screen PHQ 2/9  Decreased Interest 1 0 3  Down, Depressed, Hopeless 1 0 3  PHQ - 2 Score 2 0 6  Altered sleeping 2 0 3  Tired, decreased energy 2 0 3  Change in appetite 0 0 2  Feeling bad or failure about yourself  0 0 0  Trouble concentrating 1 0 2  Moving slowly or fidgety/restless 0 0 0  Suicidal thoughts 0 0 0  PHQ-9 Score 7 0 16  Difficult doing work/chores Somewhat difficult Not difficult at all Not difficult at all       11/11/2022    4:29 PM 09/06/2021   10:20 AM 09/18/2020   10:14 AM  GAD 7 : Generalized Anxiety Score  Nervous, Anxious, on Edge 3 3 2   Control/stop worrying 3 3 3   Worry too much - different things 3 3 3   Trouble relaxing 3 3 2   Restless 3 3 2   Easily annoyed or irritable 3 3 3   Afraid - awful might happen 0 1 2  Total GAD 7 Score 18 19 17   Anxiety Difficulty Very difficult Very difficult Very difficult    Social History   Tobacco Use   Smoking status: Every Day    Current packs/day: 0.25    Average packs/day: 0.3 packs/day for 30.0 years (7.5 ttl pk-yrs)    Types: Cigarettes   Smokeless tobacco: Never   Tobacco comments:    6 cigs daily  Vaping Use   Vaping status: Former   Start date: 08/22/2017  Substance Use Topics   Alcohol use: Yes  Alcohol/week: 2.0 standard drinks of alcohol    Types: 2 Cans of beer per week    Comment: occasionally   Drug use: Yes    Types: Marijuana    Comment: last use 01/10/21 2x/wk    Review of Systems Per HPI unless specifically indicated above     Objective:    There were no vitals taken for this visit.  Wt Readings from Last 3 Encounters:  07/20/23 185 lb 9.6 oz (84.2 kg)  11/11/22 166 lb 11.2 oz (75.6 kg)  11/04/22 166 lb 11.2 oz (75.6 kg)     Physical Exam  Note examination was completely remotely via video observation objective data only  Gen - well-appearing, no acute distress or apparent pain, comfortable HEENT - eyes appear clear without discharge  or redness Heart/Lungs - cannot examine virtually - observed no evidence of coughing or labored breathing. Abd - cannot examine virtually  Skin - face visible today- no rash Neuro - awake, alert, oriented Psych - not anxious appearing   Results for orders placed or performed during the hospital encounter of 07/20/23  CBC with Differential   Collection Time: 07/20/23  3:58 PM  Result Value Ref Range   WBC 6.8 4.0 - 10.5 K/uL   RBC 4.04 3.87 - 5.11 MIL/uL   Hemoglobin 13.4 12.0 - 15.0 g/dL   HCT 61.3 63.9 - 53.9 %   MCV 95.5 80.0 - 100.0 fL   MCH 33.2 26.0 - 34.0 pg   MCHC 34.7 30.0 - 36.0 g/dL   RDW 87.8 88.4 - 84.4 %   Platelets 243 150 - 400 K/uL   nRBC 0.0 0.0 - 0.2 %   Neutrophils Relative % 44 %   Neutro Abs 2.9 1.7 - 7.7 K/uL   Lymphocytes Relative 49 %   Lymphs Abs 3.3 0.7 - 4.0 K/uL   Monocytes Relative 7 %   Monocytes Absolute 0.5 0.1 - 1.0 K/uL   Eosinophils Relative 0 %   Eosinophils Absolute 0.0 0.0 - 0.5 K/uL   Basophils Relative 0 %   Basophils Absolute 0.0 0.0 - 0.1 K/uL   Immature Granulocytes 0 %   Abs Immature Granulocytes 0.01 0.00 - 0.07 K/uL  Basic metabolic panel   Collection Time: 07/20/23  3:58 PM  Result Value Ref Range   Sodium 135 135 - 145 mmol/L   Potassium 3.6 3.5 - 5.1 mmol/L   Chloride 106 98 - 111 mmol/L   CO2 20 (L) 22 - 32 mmol/L   Glucose, Bld 99 70 - 99 mg/dL   BUN 8 6 - 20 mg/dL   Creatinine, Ser 9.14 0.44 - 1.00 mg/dL   Calcium 8.9 8.9 - 89.6 mg/dL   GFR, Estimated >39 >39 mL/min   Anion gap 9 5 - 15  Urinalysis, Routine w reflex microscopic -Urine, Clean Catch   Collection Time: 07/20/23  5:04 PM  Result Value Ref Range   Color, Urine YELLOW YELLOW   APPearance HAZY (A) CLEAR   Specific Gravity, Urine 1.010 1.005 - 1.030   pH 5.0 5.0 - 8.0   Glucose, UA NEGATIVE NEGATIVE mg/dL   Hgb urine dipstick SMALL (A) NEGATIVE   Bilirubin Urine NEGATIVE NEGATIVE   Ketones, ur NEGATIVE NEGATIVE mg/dL   Protein, ur NEGATIVE NEGATIVE  mg/dL   Nitrite NEGATIVE NEGATIVE   Leukocytes,Ua NEGATIVE NEGATIVE   RBC / HPF 0-5 0 - 5 RBC/hpf   WBC, UA 0-5 0 - 5 WBC/hpf   Bacteria, UA RARE (A) NONE SEEN  Squamous Epithelial / HPF 11-20 0 - 5 /HPF   Mucus PRESENT   Wet prep, genital   Collection Time: 07/20/23  5:05 PM   Specimen: PATH Cytology Ancillary Only  Result Value Ref Range   Yeast Wet Prep HPF POC NONE SEEN NONE SEEN   Trich, Wet Prep NONE SEEN NONE SEEN   Clue Cells Wet Prep HPF POC PRESENT (A) NONE SEEN   WBC, Wet Prep HPF POC <10 <10   Sperm NONE SEEN   GC/Chlamydia probe amp   Collection Time: 07/20/23  5:05 PM  Result Value Ref Range   Neisseria Gonorrhea Negative    Chlamydia Negative    Comment Normal Reference Ranger Chlamydia - Negative    Comment      Normal Reference Range Neisseria Gonorrhea - Negative  RPR   Collection Time: 07/20/23  7:10 PM  Result Value Ref Range   RPR Ser Ql NON REACTIVE NON REACTIVE  Culture, blood (Routine X 2) w Reflex to ID Panel   Collection Time: 07/20/23 10:37 PM   Specimen: BLOOD  Result Value Ref Range   Specimen Description BLOOD BLOOD LEFT ARM    Special Requests      BOTTLES DRAWN AEROBIC AND ANAEROBIC Blood Culture adequate volume   Culture      NO GROWTH 5 DAYS Performed at [facility], [address] [place], [place] [address]    Report Status 07/25/2023 FINAL   HIV Antibody (routine testing w rflx)   Collection Time: 07/20/23 10:37 PM  Result Value Ref Range   HIV Screen 4th Generation wRfx Non Reactive Non Reactive  Hepatitis panel, acute   Collection Time: 07/20/23 10:37 PM  Result Value Ref Range   Hepatitis B Surface Ag NON REACTIVE NON REACTIVE   HCV Ab NON REACTIVE NON REACTIVE   Hep A IgM NON REACTIVE NON REACTIVE   Hep B C IgM NON REACTIVE NON REACTIVE  Culture, blood (Routine X 2) w Reflex to ID Panel   Collection Time: 07/20/23 10:38 PM   Specimen: BLOOD  Result Value Ref Range   Specimen Description BLOOD BLOOD LEFT ARM     Special Requests      BOTTLES DRAWN AEROBIC AND ANAEROBIC Blood Culture adequate volume   Culture      NO GROWTH 5 DAYS Performed at [facility], [address] [place], [place] [address]    Report Status 07/25/2023 FINAL   Comprehensive metabolic panel   Collection Time: 07/21/23  3:26 AM  Result Value Ref Range   Sodium 138 135 - 145 mmol/L   Potassium 3.9 3.5 - 5.1 mmol/L   Chloride 108 98 - 111 mmol/L   CO2 21 (L) 22 - 32 mmol/L   Glucose, Bld 174 (H) 70 - 99 mg/dL   BUN 5 (L) 6 - 20 mg/dL   Creatinine, Ser 9.04 0.44 - 1.00 mg/dL   Calcium 8.5 (L) 8.9 - 10.3 mg/dL   Total Protein 6.1 (L) 6.5 - 8.1 g/dL   Albumin 3.5 3.5 - 5.0 g/dL   AST 17 15 - 41 U/L   ALT 20 0 - 44 U/L   Alkaline Phosphatase 52 38 - 126 U/L   Total Bilirubin 0.6 0.0 - 1.2 mg/dL   GFR, Estimated >39 >39 mL/min   Anion gap 9 5 - 15  CBC   Collection Time: 07/21/23  3:26 AM  Result Value Ref Range   WBC 4.3 4.0 - 10.5 K/uL   RBC  3.74 (L) 3.87 - 5.11 MIL/uL   Hemoglobin 12.3 12.0 - 15.0 g/dL   HCT 64.3 (L) 63.9 - 53.9 %   MCV 95.2 80.0 - 100.0 fL   MCH 32.9 26.0 - 34.0 pg   MCHC 34.6 30.0 - 36.0 g/dL   RDW 87.8 88.4 - 84.4 %   Platelets 229 150 - 400 K/uL   nRBC 0.0 0.0 - 0.2 %      Assessment & Plan:   Problem List Items Addressed This Visit     Bipolar 1 disorder (HCC) with SA 2020 (OD pain pills) - Primary   Hidradenitis suppurativa   Assessment and Plan    Recently Hospitalized in April, treated with antibiotics and steroids. Currently has throat irritation. - Continue current cough syrup for throat irritation.  Bipolar Type 1 On medication currently Seroquel , Buspar , Trazodone   Hidradenitis Suppurativa (HS) Recently diagnosed with recurrent boils in axillary, periumbilical, and inner thigh regions. Dermatology follow-up in one month. - Add hidradenitis suppurativa to the FL2 form. - Follow up with dermatologist in one month.  Follow-up Requires updated FL2 form for [employer]  before August. - Complete and send updated FL2 form to her email by the end of the week.         No orders of the defined types were placed in this encounter.   No orders of the defined types were placed in this encounter.   Follow up plan: Return if symptoms worsen or fail to improve.   Patient verbalizes understanding with the above medical recommendations including the limitation of remote medical advice.  Specific follow-up and call-back criteria were given for patient to follow-up or seek medical care more urgently if needed.  Total duration of direct patient care provided via video conference: 10 minutes   Marsa Officer, DO [facility] 10/13/2023, 11:41 AM

## 2023-11-04 ENCOUNTER — Inpatient Hospital Stay: Payer: 59 | Attending: Hematology and Oncology | Admitting: Hematology and Oncology

## 2023-11-04 VITALS — BP 122/80 | HR 70 | Temp 98.1°F | Resp 17 | Ht 67.0 in | Wt 188.0 lb

## 2023-11-04 DIAGNOSIS — Z923 Personal history of irradiation: Secondary | ICD-10-CM | POA: Diagnosis not present

## 2023-11-04 DIAGNOSIS — N644 Mastodynia: Secondary | ICD-10-CM | POA: Insufficient documentation

## 2023-11-04 DIAGNOSIS — Z170 Estrogen receptor positive status [ER+]: Secondary | ICD-10-CM | POA: Insufficient documentation

## 2023-11-04 DIAGNOSIS — C50411 Malignant neoplasm of upper-outer quadrant of right female breast: Secondary | ICD-10-CM | POA: Insufficient documentation

## 2023-11-04 DIAGNOSIS — Z79810 Long term (current) use of selective estrogen receptor modulators (SERMs): Secondary | ICD-10-CM | POA: Insufficient documentation

## 2023-11-04 DIAGNOSIS — R232 Flushing: Secondary | ICD-10-CM | POA: Insufficient documentation

## 2023-11-04 MED ORDER — TAMOXIFEN CITRATE 20 MG PO TABS: 10.0000 mg | ORAL_TABLET | Freq: Every day | ORAL | 3 refills | Status: AC

## 2023-11-04 MED ORDER — GABAPENTIN 300 MG PO CAPS: 300.0000 mg | ORAL_CAPSULE | Freq: Every day | ORAL | 3 refills | Status: AC

## 2023-11-04 NOTE — Assessment & Plan Note (Signed)
 12/02/2018:Right lumpectomy Osker): IDC with DCIS, grade 1, 1.4cm, 4 axillary lymph nodes negative HER-2 negative (0), ER+ 51-90%, PR+ >90% (done at [facility]) Oncotype: score of 20, 6% of distant recurrence in 9 years with tamoxifen  alone (did not recommend chemotherapy) Adj XRT 01/29/19   Current treatment: Adjuvant antiestrogen therapy with tamoxifen  20 mg daily started December 2020.  Plan is for 10 years   Tamoxifen  toxicities:  Mild hot flashes   Severe pain in the right breast and right shoulder: Related to prior surgery and radiation.  I sent a prescription for gabapentin  3 mg at bedtime.  I also renewed her Flexeril .   Breast cancer surveillance: Mammogram 09/05/2023: Benign breast density category C Breast exam 11/04/2023: Benign, difficult to palpate the right side because of the pain.   Return to clinic in 1 year for follow-up

## 2023-11-04 NOTE — Progress Notes (Signed)
 Patient Care Team: Edman Marsa PARAS, DO as PCP - General (Family Medicine) Cindie Jesusa HERO, RN as Registered Nurse Dannielle, Arlean FALCON, RN (Inactive) as Registered Nurse Curvin Deward MOULD, MD as Consulting Physician (General Surgery) Odean Potts, MD as Consulting Physician (Hematology and Oncology) Dewey Rush, MD as Consulting Physician (Radiation Oncology) Ezzard Rolin BIRCH, LCSW as Social Worker (Licensed Clinical Social Worker)  DIAGNOSIS:  Encounter Diagnosis  Name Primary?   Malignant neoplasm of upper-outer quadrant of right breast in female, estrogen receptor positive (HCC) Yes    SUMMARY OF ONCOLOGIC HISTORY: Oncology History  Malignant neoplasm of upper-outer quadrant of right breast in female, estrogen receptor positive (HCC)  10/21/2018 Initial Diagnosis   31-month follow-up of breast calcifications showed calcifications increased in size, spanning 1.5cm, with no discrete mass. Biopsy on 10/21/18 showed invasive mammary carcinoma, grade 1, HER-2 negative (0), ER+ 51-90%, PR+ >90% (done at [facility])   11/10/2018 Cancer Staging   Staging form: Breast, AJCC 8th Edition - Clinical stage from 11/10/2018: Stage IA (cT1c, cN0, cM0, G1, ER+, PR+, HER2-)   11/25/2018 Genetic Testing   Negative genetic testing on a STAT panel.  The STAT Breast cancer panel offered by [facility] includes sequencing and rearrangement analysis for the following 9 genes:  ATM, BRCA1, BRCA2, CDH1, CHEK2, PALB2, PTEN, STK11 and TP53.   The report date is 11/18/2018.    SMARCA4 c.925C>A VUS identified on the multicancer panel.  The Multi-Gene Panel offered by [facility] includes sequencing and/or deletion duplication testing of the following 85 genes: AIP, ALK, APC, ATM, AXIN2,BAP1,  BARD1, BLM, BMPR1A, BRCA1, BRCA2, BRIP1, CASR, CDC73, CDH1, CDK4, CDKN1B, CDKN1C, CDKN2A (p14ARF), CDKN2A (p16INK4a), CEBPA, CHEK2, CTNNA1, DICER1, DIS3L2, EGFR (c.2369C>T, p.Thr790Met variant only), EPCAM (Deletion/duplication testing  only), FH, FLCN, GATA2, GPC3, GREM1 (Promoter region deletion/duplication testing only), HOXB13 (c.251G>A, p.Gly84Glu), HRAS, KIT, MAX, MEN1, MET, MITF (c.952G>A, p.Glu318Lys variant only), MLH1, MSH2, MSH3, MSH6, MUTYH, NBN, NF1, NF2, NTHL1, PALB2, PDGFRA, PHOX2B, PMS2, POLD1, POLE, POT1, PRKAR1A, PTCH1, PTEN, RAD50, RAD51C, RAD51D, RB1, RECQL4, RET, RNF43, RUNX1, SDHAF2, SDHA (sequence changes only), SDHB, SDHC, SDHD, SMAD4, SMARCA4, SMARCB1, SMARCE1, STK11, SUFU, TERC, TERT, TMEM127, TP53, TSC1, TSC2, VHL, WRN and WT1.  The report date is November 25, 2018.   12/02/2018 Surgery   Right lumpectomy Osker) [phone]): IDC with DCIS, grade 1, 1.4cm, 4 axillary lymph nodes negative   12/15/2018 Oncotype testing   Oncotype: score of 20, 6% of distant recurrence in 9 years with tamoxifen  alone   01/28/2019 - 03/31/2019 Radiation Therapy   The patient initially received a dose of 50.4 Gy in 28 fractions to the breast using whole-breast tangent fields. This was delivered using a 3-D conformal technique. The patient then received a boost to the seroma. This delivered an additional 10 Gy in 5 fractions using a 3-field photon boost technique. The total dose was 60.4 Gy.   03/2019 - 03/2024 Anti-estrogen oral therapy   Tamoxifen      CHIEF COMPLIANT: Follow-up on tamoxifen  therapy  HISTORY OF PRESENT ILLNESS:   History of Present Illness Carol Crawford is a 48 year old female with estrogen receptor-positive breast cancer who presents for a follow-up regarding breast health.  She has been on tamoxifen  for five years for estrogen receptor-positive breast cancer and tolerates it well without significant side effects. She experiences a tight feeling in her breasts, which is uncomfortable but usual for her. She is compliant with regular mammograms, which have returned normal results. No significant hot flashes.  ALLERGIES:  is allergic to clindamycin /lincomycin, shrimp [shellfish allergy], bee venom, wasp  venom, penicillins, and tomato.  MEDICATIONS:  Current Outpatient Medications  Medication Sig Dispense Refill   acetaminophen  (TYLENOL ) 500 MG tablet Take 500 mg by mouth every 6 (six) hours as needed for moderate pain or mild pain.     albuterol  (VENTOLIN  HFA) 108 (90 Base) MCG/ACT inhaler Inhale 2 puffs into the lungs every 4 (four) hours as needed for wheezing or shortness of breath (cough). 1 each 3   Ascorbic Acid  (VITAMIN C PO) Take 1 tablet by mouth daily.     busPIRone  (BUSPAR ) 5 MG tablet Take 1 tablet (5 mg total) by mouth 3 (three) times daily as needed (anxiety). 90 tablet 2   cyclobenzaprine  (FLEXERIL ) 5 MG tablet Take 1 tablet (5 mg total) by mouth 3 (three) times daily as needed for muscle spasms. 60 tablet 0   gabapentin  (NEURONTIN ) 300 MG capsule Take 1 capsule (300 mg total) by mouth at bedtime. 90 capsule 3   Multiple Vitamin (MULTIVITAMIN WITH MINERALS) TABS tablet Take 1 tablet by mouth daily.     nicotine  (NICODERM CQ ) 14 mg/24hr patch Place 1 patch (14 mg total) onto the skin daily. 28 patch 0   ondansetron  (ZOFRAN -ODT) 4 MG disintegrating tablet Take 1 tablet (4 mg total) by mouth every 8 (eight) hours as needed for nausea or vomiting. 30 tablet 0   polyethylene glycol powder (GLYCOLAX /MIRALAX ) 17 GM/SCOOP powder Take 17-34 g by mouth 2 (two) times daily as needed for moderate constipation. 255 g 0   QUEtiapine  (SEROQUEL ) 200 MG tablet Take 1 tablet (200 mg total) by mouth at bedtime. 90 tablet 3   SPIRIVA  RESPIMAT 2.5 MCG/ACT AERS Inhale 2 puffs into the lungs daily. 12 g 3   tamoxifen  (NOLVADEX ) 20 MG tablet Take 0.5 tablets (10 mg total) by mouth daily. 90 tablet 3   traZODone  (DESYREL ) 50 MG tablet Take 1 tablet (50 mg total) by mouth at bedtime. 90 tablet 3   No current facility-administered medications for this visit.    PHYSICAL EXAMINATION: ECOG PERFORMANCE STATUS: 1 - Symptomatic but completely ambulatory  Vitals:   11/04/23 1202  BP: 122/80  Pulse: 70   Resp: 17  Temp: 98.1 F (36.7 C)  SpO2: 100%   Filed Weights   11/04/23 1202  Weight: 188 lb (85.3 kg)    Physical Exam No palpable lumps nodules in bilateral breasts or axilla tenderness in the right axilla, decreased range of motion in the right arm  (exam performed in the presence of a chaperone)  LABORATORY DATA:  I have reviewed the data as listed    Latest Ref Rng & Units 07/21/2023    3:26 AM 07/20/2023    3:58 PM 01/08/2022    4:04 PM  CMP  Glucose 70 - 99 mg/dL 825  99  896   BUN 6 - 20 mg/dL 5  8  9    Creatinine 0.44 - 1.00 mg/dL 9.04  9.14  9.07   Sodium 135 - 145 mmol/L 138  135  141   Potassium 3.5 - 5.1 mmol/L 3.9  3.6  3.6   Chloride 98 - 111 mmol/L 108  106  110   CO2 22 - 32 mmol/L 21  20  24    Calcium 8.9 - 10.3 mg/dL 8.5  8.9  9.6   Total Protein 6.5 - 8.1 g/dL 6.1   6.7   Total Bilirubin 0.0 - 1.2 mg/dL 0.6   0.6  Alkaline Phos 38 - 126 U/L 52   55   AST 15 - 41 U/L 17   18   ALT 0 - 44 U/L 20   14     Lab Results  Component Value Date   WBC 4.3 07/21/2023   HGB 12.3 07/21/2023   HCT 35.6 (L) 07/21/2023   MCV 95.2 07/21/2023   PLT 229 07/21/2023   NEUTROABS 2.9 07/20/2023    ASSESSMENT & PLAN:  Malignant neoplasm of upper-outer quadrant of right breast in female, estrogen receptor positive (HCC) 12/02/2018:Right lumpectomy Osker): IDC with DCIS, grade 1, 1.4cm, 4 axillary lymph nodes negative HER-2 negative (0), ER+ 51-90%, PR+ >90% (done at [facility]) Oncotype: score of 20, 6% of distant recurrence in 9 years with tamoxifen alone (did not recommend chemotherapy) Adj XRT 01/29/19   Current treatment: Adjuvant antiestrogen therapy with tamoxifen 20 mg daily started December 2020.  Plan is for 10 years   Tamoxifen toxicities:  Mild hot flashes   Severe pain in the right breast and right shoulder: Related to prior surgery and radiation.   Doing much better on gabapentin.  I sent refill on that.   Breast cancer surveillance: Mammogram 09/05/2023:  Benign breast density category C Breast exam 11/04/2023: Benign, difficult to palpate the right side because of the pain.   Return to clinic in 1 year for follow-up    No orders of the defined types were placed in this encounter.  The patient has a good understanding of the overall plan. she agrees with it. she will call with any problems that may develop before the next visit here. Total time spent: 30 mins including face to face time and time spent for planning, charting and co-ordination of care   Viinay K Quillan Whitter, MD 11/04/23

## 2023-12-16 DIAGNOSIS — L728 Other follicular cysts of the skin and subcutaneous tissue: Secondary | ICD-10-CM | POA: Diagnosis not present

## 2023-12-16 DIAGNOSIS — L732 Hidradenitis suppurativa: Secondary | ICD-10-CM | POA: Diagnosis not present

## 2023-12-18 NOTE — Progress Notes (Signed)
 Carol Crawford                                          MRN: [id number]   12/18/2023   The [facility]I Quality Team Specialist reviewed this patient medical record for the purposes of chart review for care gap closure. The following were reviewed: chart review for care gap closure-colorectal cancer screening.    VBCI Quality Team

## 2023-12-26 DIAGNOSIS — L732 Hidradenitis suppurativa: Secondary | ICD-10-CM | POA: Diagnosis not present

## 2024-01-02 ENCOUNTER — Ambulatory Visit: Payer: 59

## 2024-01-07 DIAGNOSIS — L732 Hidradenitis suppurativa: Secondary | ICD-10-CM | POA: Diagnosis not present

## 2024-04-05 ENCOUNTER — Telehealth: Payer: Self-pay

## 2024-04-05 NOTE — Telephone Encounter (Signed)
 She will need an appointment in person or virtual before I can place a GI referral.  I reviewed the referral request note. It says that she was not seen by me for this problem in the past year, so it should be scheduled as an appointment, not a referral request.  I checked my last visit 09/2023 and we did not discuss GI concerns.  Also the triage note does not state the diagnosis or symptom she needs addressed.  Marsa Officer, DO [facility] 04/05/2024, 5:12 PM

## 2024-04-05 NOTE — Telephone Encounter (Signed)
 Copied from CRM #[id number]. Topic: Referral - Request for Referral >> Apr 05, 2024  1:47 PM Amy B wrote: Did the patient discuss referral with their provider in the last year? No (If No - schedule appointment) (If Yes - send message)  Appointment offered? No  Type of order/referral and detailed reason for visit: Gastroenterology  Preference of office, provider, location: [facility]  If referral order, have you been seen by this specialty before? No (If Yes, this issue or another issue? When? Where?  Can we respond through MyChart? Yes >> Apr 05, 2024  2:03 PM Montie POUR wrote: Also, please send referral to MyChart for Ms. Batalla.  >> Apr 05, 2024  2:02 PM Montie POUR wrote: Here is the address and phone number for referral: [address] [place], [place] [address] Phone: [phone] Please fax referral to [phone] - Ms. Canady said the [place] [facility] said the fax number is the same as the main number.

## 2024-04-06 NOTE — Telephone Encounter (Signed)
 Left message for patient to return call OK  to schedule an appointment to see Dr Edman

## 2024-04-06 NOTE — Telephone Encounter (Signed)
Appointment made for 1/20

## 2024-04-08 ENCOUNTER — Other Ambulatory Visit: Payer: Self-pay | Admitting: Family Medicine

## 2024-04-08 DIAGNOSIS — J432 Centrilobular emphysema: Secondary | ICD-10-CM

## 2024-04-09 NOTE — Telephone Encounter (Signed)
 Requested Prescriptions  Pending Prescriptions Disp Refills   VENTOLIN  HFA 108 (90 Base) MCG/ACT inhaler [Pharmacy Med Name: Ventolin  HFA 108 (90 Base) MCG/ACT Inhalation Aerosol Solution] 18 g 0    Sig: INHALE 2 PUFFS BY MOUTH EVERY 4 HOURS AS NEEDED FOR WHEEZING AND FOR SHORTNESS OF BREATH AND FOR COUGH     Pulmonology:  Beta Agonists 2 Passed - 04/09/2024 10:32 AM      Passed - Last BP in normal range    BP Readings from Last 1 Encounters:  11/04/23 122/80         Passed - Last Heart Rate in normal range    Pulse Readings from Last 1 Encounters:  11/04/23 70         Passed - Valid encounter within last 12 months    Recent Outpatient Visits           5 months ago Bipolar 1 disorder (HCC) with SA 2020 (OD pain pills)   [place] [facility] [place], Marsa PARAS, DO   8 months ago Acute epiglottitis without airway obstruction   [place] [facility] [place], Marsa PARAS, DO               SPIRIVA  RESPIMAT 2.5 MCG/ACT AERS [Pharmacy Med Name: Spiriva  Respimat 2.5 MCG/ACT Inhalation Aerosol Solution] 12 g 0    Sig: INHALE 2 SPRAY(S) BY MOUTH ONCE DAILY     Pulmonology:  Anticholinergic Agents Passed - 04/09/2024 10:32 AM      Passed - Valid encounter within last 12 months    Recent Outpatient Visits           5 months ago Bipolar 1 disorder (HCC) with SA 2020 (OD pain pills)   [place] [facility] [place], Marsa PARAS, DO   8 months ago Acute epiglottitis without airway obstruction   [place] [facility] [place], Marsa PARAS, [place]

## 2024-04-13 ENCOUNTER — Encounter: Payer: Self-pay | Admitting: Family Medicine

## 2024-04-13 ENCOUNTER — Other Ambulatory Visit: Payer: Self-pay | Admitting: Family Medicine

## 2024-04-13 ENCOUNTER — Telehealth: Admitting: Family Medicine

## 2024-04-13 DIAGNOSIS — J432 Centrilobular emphysema: Secondary | ICD-10-CM | POA: Diagnosis not present

## 2024-04-13 DIAGNOSIS — K219 Gastro-esophageal reflux disease without esophagitis: Secondary | ICD-10-CM

## 2024-04-13 DIAGNOSIS — F209 Schizophrenia, unspecified: Secondary | ICD-10-CM

## 2024-04-13 DIAGNOSIS — R196 Halitosis: Secondary | ICD-10-CM | POA: Diagnosis not present

## 2024-04-13 DIAGNOSIS — F319 Bipolar disorder, unspecified: Secondary | ICD-10-CM | POA: Diagnosis not present

## 2024-04-13 DIAGNOSIS — F331 Major depressive disorder, recurrent, moderate: Secondary | ICD-10-CM

## 2024-04-13 DIAGNOSIS — Z1211 Encounter for screening for malignant neoplasm of colon: Secondary | ICD-10-CM

## 2024-04-13 MED ORDER — SPIRIVA RESPIMAT 2.5 MCG/ACT IN AERS: 2.0000 | INHALATION_SPRAY | Freq: Every day | RESPIRATORY_TRACT | 11 refills | Status: AC

## 2024-04-13 MED ORDER — VENTOLIN HFA 108 (90 BASE) MCG/ACT IN AERS: 2.0000 | INHALATION_SPRAY | RESPIRATORY_TRACT | 3 refills | Status: AC | PRN

## 2024-04-13 NOTE — Progress Notes (Signed)
 "  Subjective:    Patient ID: Carol Crawford, female    DOB: July 03, 1975, 49 y.o.   MRN: [id number]  Carol Crawford is a 49 y.o. female presenting on 04/13/2024 for Abnormal breath odor   Virtual / Telehealth Encounter - Video Visit via MyChart The purpose of this virtual visit is to provide medical care while limiting exposure to the novel coronavirus (COVID19) for both patient and office staff.  Consent was obtained for remote visit:  Yes.   Answered questions that patient had about telehealth interaction:  Yes.   I discussed the limitations, risks, security and privacy concerns of performing an evaluation and management service by video/telephone. I also discussed with the patient that there may be a patient responsible charge related to this service. The patient expressed understanding and agreed to proceed.  Patient Location: Home Provider Location: [facility] (Office)  Participants in virtual visit: - Patient: Carol Crawford - CMA: Alan Fontana CMA - Provider: Dr Edman   HPI  Discussed the use of AI scribe software for clinical note transcription with the patient, who gave verbal consent to proceed.  History of Present Illness   Carol Crawford is a 49 year old female who presents with persistent breath odor and gastrointestinal symptoms.  Halitosis - Persistent breath odor unresponsive to various remedies. - No improvement following dental extractions and tonsil removal - History of upper GI symptoms, not obvious heartburn but has some symptoms of upper GI concern - Not on medication for this - Interested in GI consultation evaluation - Due for Colonoscopy screening age 1+ Interested in EGD and Colonoscopy [facility]  - No diarrhea or loose stools - No constipation - No dark stools - No blood in the stool  Centrilobular Emphysema - Currently using Spiriva  and Ventolin  for asthma control - Recent medication refills  needed  Bipolar Type 1 / Major Depression Recurrent moderate Schizophrenia Followed by Psychiatry previously, currently stable On medication management currently. No new concerns         04/13/2024    6:19 PM 02/19/2023    6:35 PM 12/26/2022    3:03 PM  Depression screen PHQ 2/9  Decreased Interest 1 1 0  Down, Depressed, Hopeless 1 1 0  PHQ - 2 Score 2 2 0  Altered sleeping 2 2 0  Tired, decreased energy 2 2 0  Change in appetite 0 0 0  Feeling bad or failure about yourself  0 0 0  Trouble concentrating 1 1 0  Moving slowly or fidgety/restless 0 0 0  Suicidal thoughts 0 0 0  PHQ-9 Score 7 7  0   Difficult doing work/chores Somewhat difficult Somewhat difficult Not difficult at all     Data saved with a previous flowsheet row definition       11/11/2022    4:29 PM 09/06/2021   10:20 AM 09/18/2020   10:14 AM  GAD 7 : Generalized Anxiety Score  Nervous, Anxious, on Edge 3  3  2    Control/stop worrying 3  3  3    Worry too much - different things 3  3  3    Trouble relaxing 3  3  2    Restless 3  3  2    Easily annoyed or irritable 3  3  3    Afraid - awful might happen 0  1  2   Total GAD 7 Score 18 19 17   Anxiety Difficulty Very difficult Very difficult Very difficult  Data saved with a previous flowsheet row definition    Social History[1]  Review of Systems Per HPI unless specifically indicated above     Objective:    There were no vitals taken for this visit.  Wt Readings from Last 3 Encounters:  11/04/23 188 lb (85.3 kg)  11/11/22 166 lb 11.2 oz (75.6 kg)  11/04/22 166 lb 11.2 oz (75.6 kg)     Physical Exam  Note examination was completely remotely via video observation objective data only  Gen - well-appearing, no acute distress or apparent pain, comfortable HEENT - eyes appear clear without discharge or redness Heart/Lungs - cannot examine virtually - observed no evidence of coughing or labored breathing. Abd - cannot examine virtually  Skin -  face visible today- no rash Neuro - awake, alert, oriented Psych - not anxious appearing   Results for orders placed or performed during the hospital encounter of 01/08/22  Lipase, blood   Collection Time: 01/08/22  4:04 PM  Result Value Ref Range   Lipase 30 11 - 51 U/L  Comprehensive metabolic panel   Collection Time: 01/08/22  4:04 PM  Result Value Ref Range   Sodium 141 135 - 145 mmol/L   Potassium 3.6 3.5 - 5.1 mmol/L   Chloride 110 98 - 111 mmol/L   CO2 24 22 - 32 mmol/L   Glucose, Bld 103 (H) 70 - 99 mg/dL   BUN 9 6 - 20 mg/dL   Creatinine, Ser 9.07 0.44 - 1.00 mg/dL   Calcium 9.6 8.9 - 89.6 mg/dL   Total Protein 6.7 6.5 - 8.1 g/dL   Albumin 4.1 3.5 - 5.0 g/dL   AST 18 15 - 41 U/L   ALT 14 0 - 44 U/L   Alkaline Phosphatase 55 38 - 126 U/L   Total Bilirubin 0.6 0.3 - 1.2 mg/dL   GFR, Estimated >39 >39 mL/min   Anion gap 7 5 - 15  CBC   Collection Time: 01/08/22  4:04 PM  Result Value Ref Range   WBC 5.2 4.0 - 10.5 K/uL   RBC 4.04 3.87 - 5.11 MIL/uL   Hemoglobin 13.3 12.0 - 15.0 g/dL   HCT 61.3 63.9 - 53.9 %   MCV 95.5 80.0 - 100.0 fL   MCH 32.9 26.0 - 34.0 pg   MCHC 34.5 30.0 - 36.0 g/dL   RDW 87.4 88.4 - 84.4 %   Platelets 248 150 - 400 K/uL   nRBC 0.0 0.0 - 0.2 %  I-Stat beta hCG blood, ED   Collection Time: 01/08/22  4:13 PM  Result Value Ref Range   I-stat hCG, quantitative <5.0 <5 mIU/mL   Comment 3              Assessment & Plan:   Problem List Items Addressed This Visit     Bipolar 1 disorder (HCC) with SA 2020 (OD pain pills)   Centrilobular emphysema (HCC) - Primary   Major depressive disorder, recurrent, moderate (HCC)   Schizophrenia (HCC)   Other Visit Diagnoses       Breath odor       Relevant Orders   Ambulatory referral to Gastroenterology     Gastroesophageal reflux disease without esophagitis       Relevant Orders   Ambulatory referral to Gastroenterology     Screening for colon cancer       Relevant Orders   Ambulatory  referral to Gastroenterology       Bipolar Type 1 / Major Depression  recurrent moderate Schizophrenia  Followed by Psychiatry previously on medication management. On medication management. Buspar , Quetiapine , Trazodone   Centrilobular emphysema Managed with Spiriva  and Ventolin . No acute exacerbations reported. - Added extra refills for Spiriva  and Ventolin .   Bad Breath, chronic GERD Upper GI symptoms Chronic problems with patients abnormal upper GI course, primary concern is bad breath, prior dental work done and has no tonsils. Interested in upper endoscopy and further evaluation  Colon CA Screening Age 51+ no prior colonoscopy  Referral to [facility] for evaluation of EGD Colonoscopy    Orders Placed This Encounter  Procedures   Ambulatory referral to Gastroenterology    Referral Priority:   Routine    Referral Type:   Consultation    Referral Reason:   Specialty Services Required    Number of Visits Requested:   1    No orders of the defined types were placed in this encounter.   Follow up plan: No follow-ups on file.   Patient verbalizes understanding with the above medical recommendations including the limitation of remote medical advice.  Specific follow-up and call-back criteria were given for patient to follow-up or seek medical care more urgently if needed.  Total duration of direct patient care provided via video conference: 5 minutes   Marsa Officer, DO [facility] 04/13/2024, 3:57 PM     [1]  Social History Tobacco Use   Smoking status: Every Day    Current packs/day: 0.25    Average packs/day: 0.3 packs/day for 30.0 years (7.5 ttl pk-yrs)    Types: Cigarettes   Smokeless tobacco: Never   Tobacco comments:    6 cigs daily  Vaping Use   Vaping status: Former   Start date: 08/22/2017  Substance Use Topics   Alcohol use: Yes    Alcohol/week: 2.0 standard drinks of alcohol     Types: 2 Cans of beer per week    Comment: occasionally   Drug use: Yes    Types: Marijuana    Comment: last use 01/10/21 2x/wk   "

## 2024-04-15 ENCOUNTER — Telehealth: Payer: Self-pay | Admitting: Family Medicine

## 2024-04-15 ENCOUNTER — Telehealth: Payer: Self-pay

## 2024-04-15 NOTE — Telephone Encounter (Signed)
 I spoke with patient and explained the referral that it couldn't be for bad breath but could be for other concerns. She stated that her insurance is requiring a form be completed prior to referral.

## 2024-04-15 NOTE — Telephone Encounter (Signed)
 Routing note to [facility] to share update with patient and to [facility] for referral.  [facility] - We received a fax from the new GI referral stating that the GI specialist does not treat her request for evaluation of Bad Breath. However they can still see her for any upper digestive acid reflux symptoms which can be contributing. Also they can evaluate her for the endoscopy and colonoscopy. I tried calling her to let her know this - but could not reach her. If you can let her know if she calls back or try to reach her again later.  [facility] - The fax also stated that they need an updated insurance referral for [facility] to schedule. Are you able to assist with submitting the referral to [facility] to allow GI to schedule?  Thank you both!  Marsa Officer, DO [facility] 04/15/2024, 1:27 PM

## 2024-04-15 NOTE — Telephone Encounter (Signed)
 Copied from CRM #[id number]. Topic: Referral - Question >> Apr 15, 2024  1:57 PM Fonda T wrote: Reason for CRM: Pt calling, states she received a call from office.  Pt would like a return call to discuss GI referral.   Reports she received a call from GI office but did not understand the reason they were calling her, as they were not able to schedule appt for pt.  Pt would like a return call to discuss further.  Can be reached at [phone].  Aware of same day follow up call.

## 2024-04-15 NOTE — Telephone Encounter (Signed)
 Please refer to previous message about referral

## 2024-04-16 ENCOUNTER — Telehealth: Payer: Self-pay

## 2024-04-16 NOTE — Telephone Encounter (Signed)
 Copied from CRM [id number]. Topic: Referral - Question >> Apr 16, 2024 10:25 AM Montie POUR wrote: Reason for CRM:  Peggy with Dr. Marily office is calling to get the doctor's name changed on the [facility] referral to Dr. Vester; NPI [id number];  Change 1st diagnosis to K21.9 Change 2nd diagnosis to R19.6 Use same dates for referral  This is so [employer] will pay. She would like to get it fixed today because is next week. Thanks so much!

## 2024-11-04 ENCOUNTER — Ambulatory Visit: Admitting: Hematology and Oncology
